# Patient Record
Sex: Male | Born: 1940 | Race: White | Hispanic: No | Marital: Married | Smoking: Former smoker
Health system: Southern US, Community
[De-identification: ages and names within clinical notes are randomized; demographics above are authoritative.]

## PROBLEM LIST (undated history)

## (undated) DIAGNOSIS — M1990 Unspecified osteoarthritis, unspecified site: Secondary | ICD-10-CM

## (undated) DIAGNOSIS — K565 Intestinal adhesions [bands], unspecified as to partial versus complete obstruction: Secondary | ICD-10-CM

## (undated) DIAGNOSIS — K432 Incisional hernia without obstruction or gangrene: Secondary | ICD-10-CM

## (undated) DIAGNOSIS — G8929 Other chronic pain: Secondary | ICD-10-CM

## (undated) DIAGNOSIS — J329 Chronic sinusitis, unspecified: Secondary | ICD-10-CM

## (undated) DIAGNOSIS — C801 Malignant (primary) neoplasm, unspecified: Secondary | ICD-10-CM

## (undated) DIAGNOSIS — M549 Dorsalgia, unspecified: Secondary | ICD-10-CM

## (undated) DIAGNOSIS — R55 Syncope and collapse: Secondary | ICD-10-CM

## (undated) DIAGNOSIS — I499 Cardiac arrhythmia, unspecified: Secondary | ICD-10-CM

## (undated) DIAGNOSIS — K219 Gastro-esophageal reflux disease without esophagitis: Secondary | ICD-10-CM

## (undated) DIAGNOSIS — R42 Dizziness and giddiness: Secondary | ICD-10-CM

## (undated) HISTORY — PX: ELBOW SURGERY: SHX618

## (undated) HISTORY — PX: ESOPHAGEAL DILATION: SHX303

## (undated) HISTORY — PX: SHOULDER ARTHROSCOPY: SHX128

## (undated) HISTORY — PX: VASECTOMY: SHX75

## (undated) HISTORY — PX: SACRAL NERVE STIMULATOR PLACEMENT: SHX2367

## (undated) HISTORY — PX: HERNIA REPAIR: SHX51

## (undated) HISTORY — PX: INCISION AND DRAINAGE FOOT: SHX1800

## (undated) HISTORY — PX: BACK SURGERY: SHX140

---

## 1997-09-21 ENCOUNTER — Encounter: Admission: RE | Admit: 1997-09-21 | Discharge: 1997-12-20 | Payer: Self-pay | Admitting: Specialist

## 1997-12-04 ENCOUNTER — Ambulatory Visit (HOSPITAL_BASED_OUTPATIENT_CLINIC_OR_DEPARTMENT_OTHER): Admission: RE | Admit: 1997-12-04 | Discharge: 1997-12-04 | Payer: Self-pay | Admitting: Surgery

## 1999-06-11 ENCOUNTER — Encounter (INDEPENDENT_AMBULATORY_CARE_PROVIDER_SITE_OTHER): Payer: Self-pay

## 1999-06-11 ENCOUNTER — Ambulatory Visit (HOSPITAL_COMMUNITY): Admission: RE | Admit: 1999-06-11 | Discharge: 1999-06-11 | Payer: Self-pay | Admitting: Gastroenterology

## 2000-09-07 ENCOUNTER — Encounter: Admission: RE | Admit: 2000-09-07 | Discharge: 2000-09-25 | Payer: Self-pay | Admitting: *Deleted

## 2001-05-14 ENCOUNTER — Encounter: Payer: Self-pay | Admitting: Gastroenterology

## 2001-05-14 ENCOUNTER — Ambulatory Visit (HOSPITAL_COMMUNITY): Admission: RE | Admit: 2001-05-14 | Discharge: 2001-05-14 | Payer: Self-pay | Admitting: Gastroenterology

## 2001-07-13 ENCOUNTER — Encounter: Payer: Self-pay | Admitting: Orthopaedic Surgery

## 2001-07-13 ENCOUNTER — Ambulatory Visit (HOSPITAL_COMMUNITY): Admission: RE | Admit: 2001-07-13 | Discharge: 2001-07-13 | Payer: Self-pay | Admitting: *Deleted

## 2001-08-20 ENCOUNTER — Encounter: Payer: Self-pay | Admitting: Orthopaedic Surgery

## 2001-08-25 ENCOUNTER — Inpatient Hospital Stay (HOSPITAL_COMMUNITY): Admission: RE | Admit: 2001-08-25 | Discharge: 2001-08-27 | Payer: Self-pay | Admitting: Orthopaedic Surgery

## 2001-08-25 ENCOUNTER — Encounter: Payer: Self-pay | Admitting: Orthopaedic Surgery

## 2001-08-25 HISTORY — PX: BACK SURGERY: SHX140

## 2002-03-24 ENCOUNTER — Encounter: Payer: Self-pay | Admitting: Orthopaedic Surgery

## 2002-03-24 ENCOUNTER — Ambulatory Visit (HOSPITAL_COMMUNITY): Admission: RE | Admit: 2002-03-24 | Discharge: 2002-03-25 | Payer: Self-pay | Admitting: Orthopaedic Surgery

## 2002-03-24 HISTORY — PX: OTHER SURGICAL HISTORY: SHX169

## 2002-04-23 HISTORY — PX: COLON SURGERY: SHX602

## 2002-05-17 ENCOUNTER — Inpatient Hospital Stay (HOSPITAL_COMMUNITY): Admission: EM | Admit: 2002-05-17 | Discharge: 2002-05-25 | Payer: Self-pay | Admitting: Emergency Medicine

## 2002-05-17 ENCOUNTER — Encounter: Payer: Self-pay | Admitting: Emergency Medicine

## 2002-05-18 ENCOUNTER — Encounter (INDEPENDENT_AMBULATORY_CARE_PROVIDER_SITE_OTHER): Payer: Self-pay | Admitting: Specialist

## 2002-08-22 HISTORY — PX: OTHER SURGICAL HISTORY: SHX169

## 2002-10-14 ENCOUNTER — Inpatient Hospital Stay (HOSPITAL_COMMUNITY): Admission: RE | Admit: 2002-10-14 | Discharge: 2002-10-20 | Payer: Self-pay

## 2002-10-14 ENCOUNTER — Encounter (INDEPENDENT_AMBULATORY_CARE_PROVIDER_SITE_OTHER): Payer: Self-pay | Admitting: Specialist

## 2003-05-19 ENCOUNTER — Ambulatory Visit (HOSPITAL_COMMUNITY): Admission: RE | Admit: 2003-05-19 | Discharge: 2003-05-19 | Payer: Self-pay | Admitting: Orthopaedic Surgery

## 2006-01-19 ENCOUNTER — Encounter: Admission: RE | Admit: 2006-01-19 | Discharge: 2006-01-19 | Payer: Self-pay | Admitting: Orthopaedic Surgery

## 2006-06-26 ENCOUNTER — Encounter: Admission: RE | Admit: 2006-06-26 | Discharge: 2006-06-26 | Payer: Self-pay | Admitting: Orthopaedic Surgery

## 2007-02-13 ENCOUNTER — Encounter: Admission: RE | Admit: 2007-02-13 | Discharge: 2007-02-13 | Payer: Self-pay | Admitting: Orthopaedic Surgery

## 2007-11-23 ENCOUNTER — Encounter: Admission: RE | Admit: 2007-11-23 | Discharge: 2007-11-23 | Payer: Self-pay | Admitting: Orthopaedic Surgery

## 2008-02-10 ENCOUNTER — Encounter: Admission: RE | Admit: 2008-02-10 | Discharge: 2008-02-10 | Payer: Self-pay | Admitting: Orthopaedic Surgery

## 2008-03-31 ENCOUNTER — Encounter: Admission: RE | Admit: 2008-03-31 | Discharge: 2008-03-31 | Payer: Self-pay | Admitting: Orthopedic Surgery

## 2008-06-23 HISTORY — PX: OTHER SURGICAL HISTORY: SHX169

## 2008-06-23 HISTORY — PX: KNEE ARTHROSCOPY: SUR90

## 2008-10-05 ENCOUNTER — Ambulatory Visit (HOSPITAL_COMMUNITY): Admission: RE | Admit: 2008-10-05 | Discharge: 2008-10-05 | Payer: Self-pay | Admitting: Gastroenterology

## 2008-10-05 ENCOUNTER — Encounter (INDEPENDENT_AMBULATORY_CARE_PROVIDER_SITE_OTHER): Payer: Self-pay | Admitting: Gastroenterology

## 2009-03-06 ENCOUNTER — Inpatient Hospital Stay (HOSPITAL_COMMUNITY): Admission: EM | Admit: 2009-03-06 | Discharge: 2009-03-10 | Payer: Self-pay | Admitting: Emergency Medicine

## 2009-07-03 ENCOUNTER — Inpatient Hospital Stay (HOSPITAL_COMMUNITY): Admission: EM | Admit: 2009-07-03 | Discharge: 2009-07-06 | Payer: Self-pay | Admitting: Emergency Medicine

## 2010-07-14 ENCOUNTER — Encounter: Payer: Self-pay | Admitting: Orthopaedic Surgery

## 2010-09-08 LAB — COMPREHENSIVE METABOLIC PANEL
ALT: 37 U/L (ref 0–53)
AST: 30 U/L (ref 0–37)
AST: 33 U/L (ref 0–37)
Albumin: 3.3 g/dL — ABNORMAL LOW (ref 3.5–5.2)
Alkaline Phosphatase: 78 U/L (ref 39–117)
BUN: 20 mg/dL (ref 6–23)
BUN: 25 mg/dL — ABNORMAL HIGH (ref 6–23)
Calcium: 10.6 mg/dL — ABNORMAL HIGH (ref 8.4–10.5)
Chloride: 101 mEq/L (ref 96–112)
Chloride: 111 mEq/L (ref 96–112)
Creatinine, Ser: 1.35 mg/dL (ref 0.4–1.5)
GFR calc Af Amer: 31 mL/min — ABNORMAL LOW (ref >60)
Glucose, Bld: 194 mg/dL — ABNORMAL HIGH (ref 70–99)
Sodium: 136 mEq/L (ref 135–145)
Total Bilirubin: 1.1 mg/dL (ref 0.3–1.2)
Total Bilirubin: 1.3 mg/dL — ABNORMAL HIGH (ref 0.3–1.2)
Total Protein: 6.1 g/dL (ref 6.0–8.3)
Total Protein: 9.8 g/dL — ABNORMAL HIGH (ref 6.0–8.3)

## 2010-09-08 LAB — DIFFERENTIAL
Basophils Absolute: 0 10*3/uL (ref 0.0–0.1)
Basophils Relative: 0 % (ref 0–1)
Eosinophils Absolute: 0.1 10*3/uL (ref 0.0–0.7)
Eosinophils Relative: 2 % (ref 0–5)
Lymphocytes Relative: 17 % (ref 12–46)
Neutro Abs: 10.1 10*3/uL — ABNORMAL HIGH (ref 1.7–7.7)
Neutro Abs: 3.1 10*3/uL (ref 1.7–7.7)
Neutrophils Relative %: 74 % (ref 43–77)
Neutrophils Relative %: 88 % — ABNORMAL HIGH (ref 43–77)

## 2010-09-08 LAB — BASIC METABOLIC PANEL
Calcium: 8.7 mg/dL (ref 8.4–10.5)
GFR calc Af Amer: >60 mL/min (ref >60)
GFR calc non Af Amer: >60 mL/min (ref >60)
Potassium: 4 mEq/L (ref 3.5–5.1)

## 2010-09-08 LAB — CBC
HCT: 36.1 % — ABNORMAL LOW (ref 39.0–52.0)
HCT: 51.6 % (ref 39.0–52.0)
Hemoglobin: 17.5 g/dL — ABNORMAL HIGH (ref 13.0–17.0)
MCHC: 34.3 g/dL (ref 30.0–36.0)
MCHC: 34.8 g/dL (ref 30.0–36.0)
MCV: 88.4 fL (ref 78.0–100.0)
MCV: 88.7 fL (ref 78.0–100.0)
MCV: 89 fL (ref 78.0–100.0)
Platelets: 139 10*3/uL — ABNORMAL LOW (ref 150–400)
RBC: 3.94 MIL/uL — ABNORMAL LOW (ref 4.22–5.81)
RBC: 5.8 MIL/uL (ref 4.22–5.81)
RDW: 13.3 % (ref 11.5–15.5)
WBC: 11.4 10*3/uL — ABNORMAL HIGH (ref 4.0–10.5)

## 2010-09-08 LAB — FECAL LACTOFERRIN, QUANT

## 2010-09-08 LAB — OVA AND PARASITE EXAMINATION

## 2010-09-08 LAB — CLOSTRIDIUM DIFFICILE EIA: C difficile Toxins A+B, EIA: NEGATIVE

## 2010-09-08 LAB — LIPASE, BLOOD: Lipase: 17 U/L (ref 11–59)

## 2010-09-27 LAB — CBC
HCT: 41.4 % (ref 39.0–52.0)
MCHC: 34.1 g/dL (ref 30.0–36.0)
MCHC: 35.4 g/dL (ref 30.0–36.0)
MCV: 87.9 fL (ref 78.0–100.0)
MCV: 88.5 fL (ref 78.0–100.0)
Platelets: 131 10*3/uL — ABNORMAL LOW (ref 150–400)
Platelets: 200 10*3/uL (ref 150–400)
RBC: 3.78 MIL/uL — ABNORMAL LOW (ref 4.22–5.81)
RDW: 13.1 % (ref 11.5–15.5)
RDW: 13.1 % (ref 11.5–15.5)
WBC: 9.4 10*3/uL (ref 4.0–10.5)

## 2010-09-27 LAB — URINALYSIS, ROUTINE W REFLEX MICROSCOPIC
Bilirubin Urine: NEGATIVE
Ketones, ur: NEGATIVE mg/dL
Nitrite: NEGATIVE
Specific Gravity, Urine: 1.008 (ref 1.005–1.030)
pH: 5.5 (ref 5.0–8.0)

## 2010-09-27 LAB — DIFFERENTIAL
Basophils Absolute: 0.1 10*3/uL (ref 0.0–0.1)
Lymphocytes Relative: 12 % (ref 12–46)
Lymphs Abs: 1.1 10*3/uL (ref 0.7–4.0)
Monocytes Absolute: 0.4 10*3/uL (ref 0.1–1.0)
Neutro Abs: 7.7 10*3/uL (ref 1.7–7.7)

## 2010-09-27 LAB — COMPREHENSIVE METABOLIC PANEL
Albumin: 4.3 g/dL (ref 3.5–5.2)
BUN: 8 mg/dL (ref 6–23)
Calcium: 9.6 mg/dL (ref 8.4–10.5)
Chloride: 102 mEq/L (ref 96–112)
Creatinine, Ser: 1.02 mg/dL (ref 0.4–1.5)
Total Bilirubin: 0.7 mg/dL (ref 0.3–1.2)

## 2010-09-27 LAB — LIPASE, BLOOD: Lipase: 11 U/L (ref 11–59)

## 2010-09-27 LAB — BASIC METABOLIC PANEL
BUN: 3 mg/dL — ABNORMAL LOW (ref 6–23)
Calcium: 9 mg/dL (ref 8.4–10.5)
Creatinine, Ser: 0.97 mg/dL (ref 0.4–1.5)
Glucose, Bld: 141 mg/dL — ABNORMAL HIGH (ref 70–99)
Potassium: 3.7 mEq/L (ref 3.5–5.1)
Sodium: 138 mEq/L (ref 135–145)

## 2010-11-05 NOTE — Op Note (Signed)
NAMEJENIEL, [name]                ACCOUNT NO.:  [id number]   MEDICAL RECORD NO.:  [id number]          PATIENT TYPE:  AMB   LOCATION:  ENDO                         FACILITY:  [facility]   PHYSICIAN:  Petra Kuba, M.D.    DATE OF BIRTH:  Jun 29, 1940   DATE OF PROCEDURE:  10/05/2008  DATE OF DISCHARGE:                               OPERATIVE REPORT   PROCEDURE:  Esophagogastroduodenoscopy with biopsy and dilatation.   INDICATIONS:  The patient with dysphagia and history of esophageal  stricture.  Consent was signed after risks, benefits, methods, and  options thoroughly discussed multiple times in the past.   MEDICINES USED:  1. Fentanyl 100 mcg.  2. Versed 10 mg.   SURGEON:  Petra Kuba, MD   DETAILS OF PROCEDURE:  The videoendoscope was inserted by direct vision.  Initially, the proximal and mid esophagus was normal.  He did have some  spasm at the GE junction, but when it resolved, he had a small hiatal  hernia and a widely patent thin fibrous ring.  The scope was slowly  withdrawn through the esophagus.  At some point, it contracted and it  seem to have some ring-like formations possibly compatible with  eosinophilic esophagitis.  Based on that, we elected to take a few  biopsies of the proximal esophagus and not do a Savary dilatation, but  instead do a less aggressive medium-sized balloon dilatation.  The  scope, prior to doing the balloon, was advanced into the stomach,  advanced through a normal antrum, normal pylorus, into a normal duodenal  bulb around the celiac to normal second portion of the duodenum.  A  normal-appearing ampulla was seen.  The scope was withdrawn back to the  bulb and a good look there ruled out abnormalities in all locations.  The scope was withdrawn back to the stomach and retroflexed.  High in  the cardia, the hiatal hernia was confirmed.  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  Straight visualization of the stomach did not  reveal any additional findings.  The scope was then slowly withdrawn one  more time through the esophagus, and again we elected to proceed with a  balloon dilatation.  We advanced into the stomach the 8-cm, 12-15-mm  balloon was advanced into the stomach and withdrawn back to the proper  position across the ring.  We initially inflated it to 12, but the  balloon could slide in and out easily.  We increased it to 13.5, and  again the balloon could slide in and out easily, and we then inflated it  to 15 and held it for a minute.  After 1 minute, the balloon was  removed.  There was minimal trauma to the ring and a little bit of  trauma to the distal esophagus, but no significant tear or complication,  but based on the trauma caused by the 15 balloon, we elected to not be  any  further aggressive until we get the biopsy results in the future.  We elected to stop the procedure at this junction.  The scope was  removed.  The patient tolerated the procedure well.  There was no  obvious immediate complication.   ENDOSCOPIC DIAGNOSES:  1. Questionable eosinophilic esophagitis status post proximal      esophageal biopsies.  2. Distal esophageal spasm.  3. Small hiatal hernia with a widely patent ring status post balloon      dilatation to 15 mm as above with minimal trauma.  4. Few gastric tiny polyps not biopsied, without significance.  5. Otherwise, within normal limits esophagogastroduodenoscopy.   PLAN:  Await pathology to see if he has eosinophilic esophagitis and  also to see if the dilatation works.  He likes the Nexium better than  the sample pump inhibitors I tried, which I believe is Protonix and so  we will give him a prescription of that and see him back p.r.n. or in 6  weeks to decide any further workup plans or more aggressive dilatation  if he does not have  eosinophilic esophagitis.           ______________________________  Petra Kuba, M.D.     MEM/MEDQ  D:  10/05/2008  T:  10/06/2008  Job:  [id number]   cc:   Holley Bouche, M.D.

## 2010-11-08 NOTE — Discharge Summary (Signed)
[place]. [facility]  Patient:    Kent [name], Kent [name] Visit Number: [id number] MRN: [id number]          Service Type: SUR Location: [id number] Attending Physician:  Patricia Nettle Dictated by:   Druscilla Brownie. Shela Nevin, P.A. Admit Date:  08/25/2001 Discharge Date: 08/27/2001                             Discharge Summary  ADMISSION DIAGNOSES: 1. Cervical spondylitis with radiculopathy. 2. Gastroesophageal reflux disease.  DISCHARGE DIAGNOSES: 1. Cervical spondylitis with radiculopathy. 2. Gastroesophageal reflux disease.  OPERATION:  On August 25, 2001 the patient underwent anterior cervical decompression foraminotomy at C4-C5, C5-C6, C6-C7 with left iliac crest bone graft.  DISCUSSION:  The patient has had ongoing and progressive problems concerning cervical spine with radiculitis in the upper extremities.  He is quite uncomfortable with this.  He has essentially had to "live on Darvocet" due to this pain and discomfort.  He has finally decided it would be worthwhile for surgical intervention.  After MRI confirmation of the pathology, he was scheduled for the above surgery.  HOSPITAL COURSE:  The patient tolerated the surgical procedure quite well.  We placed him on 3300 for close observation concerning any edema he may had in the neck preventing swallowing or breathing.  He went through the first couple of days with no problems whatsoever.  The Penrose drain that was left in the neck wound was pulled.  Dr. Noel Gerold saw him on the morning of surgery and felt that he could be maintained in his home environment.  Arrangements were made for discharge.  The patient had some sinus problems from previous sinus surgery and felt like it was infected.  Indeed he did have a green drainage.  It was felt we could treat this with a Z-pak.  LABORATORY VALUES IN THE HOSPITAL:  Hematocrit and hemoglobin which was essentially normal other than some mild anemia.  BMET  was essentially negative.  Urinalysis negative for urinary tract infection.  Electrocardiogram showed normal sinus rhythm.  No chest x-ray report is on his chart.  CONDITION ON DISCHARGE:  Improved and stable.  PLAN:  The patient is discharged to his home.  Use dry dressing to the neck p.r.n. and the left hip dressing was dry at discharge and he may use a dry bandage there as well.  He was given a prescription for Percocet 5 mg, #50, one to q.4-6h. p.r.n. pain. Robaxin 500 mg, #30, with a refill, one q.6h. p.r.n. muscle spasm. Follow directions on Z-pak which prescription is given.  Return to see Dr. Noel Gerold in about ten days. Dictated by:   Druscilla Brownie. Shela Nevin, P.A. Attending Physician:  Patricia Nettle. DD:  08/27/01 TD:  08/30/01 Job: [id number]

## 2010-11-08 NOTE — H&P (Signed)
NAME:  Kent [name], Kent [name]                          ACCOUNT NO.:  [id number]   MEDICAL RECORD NO.:  [id number]                   PATIENT TYPE:  INP   LOCATION:  0462                                 FACILITY:  [facility]   PHYSICIAN:  Lorre Munroe., M.D.            DATE OF BIRTH:  1941-03-01   DATE OF ADMISSION:  05/17/2002  DATE OF DISCHARGE:                                HISTORY & PHYSICAL   CHIEF COMPLAINT:  Abdominal pain.   HISTORY OF PRESENT ILLNESS:  The patient is a 70 year old white male with  severe increasing abdominal pain for one day.  He had elevation of the white  count with shift to the left.  CT scan was compatible with a severe colitis,  perhaps ischemic, involving mostly the left side of the colon.  There was  also a good deal of free abdominal fluid and distention of the bowel.  He  was brought into the hospital for emergency exploration.   PAST MEDICAL HISTORY:  The patient denies any heart trouble.  He is on no  cardiac medications.   MEDICATIONS:  Nexium, Percocet.  He takes that because of a lot of problems  with his back.   ALLERGIES:  He is allergic to CIPRO.   SOCIAL HISTORY:  He does not drink alcoholic beverages.  He used to smoke  cigarettes, but quit 20 years ago.  He denies all serious chronic ailments  except the above.   PAST SURGICAL HISTORY:  He has had surgery on his back, surgery on his neck  but he cannot tell me exactly what it was.  He has also had orthopedic  surgery on foot and the arm.   PHYSICAL EXAMINATION:  GENERAL:  The patient is in acute pain, although is  oriented and appropriate.  VITAL SIGNS:  As recorded by nursing staff.  NEUROLOGIC:  Mental status was felt to be normal.  HEENT:  Head, neck, eyes, ears, nose, throat, and throat unremarkable.  HEART:  Rate fast, rhythm regular with no murmur or gallop noted.  CHEST:  Clear to auscultation.  ABDOMEN:  Distended and extremely tender in all quadrants.  No bowel sounds  heard.   No masses noted.  GENITALIA:  Normal.  RECTAL:  Normal.  Very small amount of brown stool present without any blood  in it.  Prostate was a little bit enlarged.  EXTREMITIES:  Normal.  LYMPH:  Nodes not enlarged.  SKIN:  No lesions are noted.   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   PLAN:  Immediate exploration.                                              Lorre Munroe., M.D.   Jodi Marble  D:  05/20/2002  T:  05/20/2002  Job:  [id number]

## 2010-11-08 NOTE — H&P (Signed)
NAME:  Kent [name], Kent [name]                          ACCOUNT NO.:  [id number]   MEDICAL RECORD NO.:  [id number]                   PATIENT TYPE:  OIB   LOCATION:  5024                                 FACILITY:  [facility]   PHYSICIAN:  Patricia Nettle, M.D.                  DATE OF BIRTH:  1941/04/21   DATE OF ADMISSION:  03/24/2002  DATE OF DISCHARGE:  03/25/2002                                HISTORY & PHYSICAL   CHIEF COMPLAINT:  Back and right lower extremity pain.   HISTORY:  The patient is a 70 year old male with back and right lower  extremity pain that has been getting progressively worse in the recent L5  distribution. It is significantly worse with activities, somewhat relieved  with rest; however, it is near constant in nature. He has been taking  Percocet 10/750 four tablets a day as well as Celebrex and Elavil. MRI scan  of the lumbar scan did show broad-based disk bulge at L4-5 with facet  hypertrophy, element of __________ stenosis. Pain had gotten to the point  where it was near constant in nature and was affecting his activities of  daily living as well as his quality of life. Risks and benefits of surgery  were discussed with the patient by Dr. Noel Gerold as well as myself, and he  indicating understanding and opted to proceed.   ALLERGIES:  No known drug allergies.   MEDICATIONS:  1. Mepergan Fortis p.r.n.  2. Nexium 40 mg p.o. q.d.  3. Amoxicillin for sinus infection.   PAST MEDICAL HISTORY:  Significant for gastroesophageal reflux disease,  history of bronchitis, and history of basal cell cancer.   PAST SURGICAL HISTORY:  Anterior cervical diskectomy and fusion in 2003.  1. Sinus surgery in '96.  2. Hemorrhoidectomy in '99.  3. Right foot surgery in 2002.  4. Basal cell carcinoma removed from his back.   SOCIAL HISTORY:  The patient denies tobacco use. Denies alcohol use. He is  retired. Married, has two grown children. His wife is in good health and  will help him  through his postoperative course.   FAMILY HISTORY:  Father deceased at age 49 secondary to a brain tumor.  Mother deceased at age 1, unknown cause.   REVIEW OF SYMPTOMS:  The patient denies any fevers, chills, sweats, or  bleeding tendencies. CNS:  Denies blurred vision, double vision, seizures,  headaches, paralysis. CARDIOVASCULAR EXAM:  She denies pain, angina,  orthopnea, claudication, palpitations. PULMONARY:  Denies shortness of  breath, productive cough, or hemoptysis. GASTROINTESTINAL:  Denies nausea,  vomiting, constipation, diarrhea, melena, dysuria and hematuria.  MUSCULOSKELETAL:  As per HPI.   PHYSICAL EXAMINATION:  VITAL SIGNS:  Blood pressure 100/76, respirations 60  and unlabored, pulse 72 and regular.  GENERAL APPEARANCE:  The patient is a 70 year old white male who is alert  and oriented in no acute distress, well-nourished, well-groomed, appears his  stated age. Pleasant and cooperative to examination.  HEENT:  Normocephalic, atraumatic. Pupils are equal and reactive.  Extraocular movements intact. Nasal patent. Oropharynx clear.  NECK:  Supple to palpation, no bruits appreciated, no lymphadenopathy or  thyromegaly noted. There is a well-healed scar on the left side of his  negative from his anterior cervical surgery earlier this year.  CHEST:  Clear to auscultation bilaterally. No rales, rhonchi, wheezes or  friction rubs.  HEART:  S1 and S2. Regular, rate, and rhythm with no murmurs, gallops, or  rubs noted.  ABDOMEN:  Soft to palpation, positive bowel sounds, nontender, nondistended,  no organomegaly noted.  GENITOURINARY:  Not pertinent.  EXTREMITIES:  __________ dorsalis pedis and posterior tibialis pulses. He is  neurovascularly intact. Motor and sensation was intact grossly on today's  exam. The sensation is decreased in a right L5 pattern. Straight leg raise  is negative. Head to knee range of motion is pain free.  SKIN:  Intact.   LABORATORY DATA:   MRI shows a HNP at L4-5.   IMPRESSION:  1. Herniated nucleus pulposus, L4-5.  2. Gastroesophageal reflux disease.  3. History of basal cell carcinoma.   PLAN:  1. Admit to [facility] hospital on 03/24/02 for microdiskectomy on L4-5 on     the right to be done by Dr. Noel Gerold.  2. The patient's primary care is Dr. Johny Blamer.     Verlin Fester, P.A.                       Patricia Nettle, M.D.    CM/MEDQ  D:  03/24/2002  T:  03/28/2002  Job:  [id number]

## 2010-11-08 NOTE — H&P (Signed)
   NAME:  Kent [name], Kent [name]                          ACCOUNT NO.:  [id number]   MEDICAL RECORD NO.:  [id number]                   PATIENT TYPE:  INP   LOCATION:  X001                                 FACILITY:  [facility]   PHYSICIAN:  Lorre Munroe., M.D.            DATE OF BIRTH:  [birth year]   DATE OF ADMISSION:  10/14/2002  DATE OF DISCHARGE:                                HISTORY & PHYSICAL   CHIEF COMPLAINT:  Ileostomy.   HISTORY OF PRESENT ILLNESS:  The patient is now about five months status  post ileostomy and total abdominal colectomy with stapling of the rectum  because of infarction of the colon thought to be due on a vascular basis.  He recovered quite nicely and requested ileostomy closure.  I performed  sigmoidoscopy showing a healthy distal bowel.  He took a bowel prep at home  and was admitted to the hospital for surgery.   PAST MEDICAL HISTORY:  The patient denies serious chronic aliments.  He  specifically denies any heart trouble.   MEDICATIONS:  1. Nexium occasionally.  2. He has been on doxycycline for prostatitis.  3. Percocet occasionally.   PAST SURGICAL HISTORY:  1. Back surgery and neck surgery, but not sure of the procedures.  2. Also had some orthopaedic surgery on the foot and arm.   ALLERGIES:  1. CIPRO causes nausea.  2. CODEINE causes itching.   HABITS:  He does not drink alcoholic beverages.  He smoked for a long time,  but quit about 20 years ago.   FAMILY HISTORY:  Unremarkable.   CHILDHOOD ILLNESSES:  Unremarkable.   REVIEW OF SYMPTOMS:  Unremarkable.   PHYSICAL EXAMINATION:  GENERAL:  A cheerful, healthy-appearing man.  Mental  status normal.  VITAL SIGNS:  Unremarkable per nursing report.  HEENT:  Normal.  NECK:  Normal.  CHEST:  Clear to auscultation.  HEART:  Regular rate and rhythm, no murmur or gallop.  ABDOMEN:  Functioning ileostomy in the right mid abdomen.  Well-healed  midline incision without hernia.  No mass, tenderness,  or organomegaly.  RECTUM:  Normal.  GENITOURINARY:  Normal.  EXTREMITIES:  Normal, pulses good.  SKIN:  No lesions noted.  LYMPH NODES:  None enlarged.  NEUROLOGIC:  Normal.   IMPRESSION:  1. Functioning ileostomy.  2. 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   PLAN:  Ileostomy closure.                                               Lorre Munroe., M.D.    Jodi Marble  D:  10/14/2002  T:  10/14/2002  Job:  [id number]

## 2010-11-08 NOTE — Op Note (Signed)
NAME:  Kent [name], Kent [name]                          ACCOUNT NO.:  [id number]   MEDICAL RECORD NO.:  [id number]                   PATIENT TYPE:  INP   LOCATION:  0374                                 FACILITY:  [facility]   PHYSICIAN:  Kent Munroe., M.D.            DATE OF BIRTH:  [birth year]   DATE OF PROCEDURE:  10/14/2002  DATE OF DISCHARGE:                                 OPERATIVE REPORT   PREOPERATIVE DIAGNOSIS:  Functional colostomy, status post total colectomy  for ischemic colitis.   POSTOPERATIVE DIAGNOSIS:  Functional colostomy, status post total colectomy  for ischemic colitis.   OPERATION:  Ileoproctostomy; laparoscopy and takedown of adhesions.   SURGEON:  Kent [name], M.D.   ASSISTANT:  Kent [name], M.D.   ANESTHESIA:  General.   DESCRIPTION OF PROCEDURE:  After the patient was monitored and anesthetized  and had routine preparation and draping of the abdomen and had a Foley  catheter in place and with the legs up in stirrups, I made a small  transverse incision in the left mid abdomen far laterally and dissected down  through fat, incised the external oblique in the direction of the fibers,  split the other muscle layers, then pulled up on the transversalis fascia  and peritoneum and opened this sharply. I placed a 0 Vicryl pursestring in  the muscles and secured an Hasson canula and achieved pneumoperitoneum.  There were some midline adhesions.  Otherwise, there were very few  adhesions.  I put in a 10 mm right upper quadrant port and a 5 mm left lower  quadrant port, took down the adhesions using blunt dissection and the  harmonic scalpel.  I then was able to easily see the ileostomy and found it  to be generally free of adhesions.  The pelvis was also quite free of  adhesions.  I could see the sutures that I had placed in the rectal stump in  order to identify it for closure.  I did not feel that I needed to do any  further dissection laparoscopically,  and so I then made an elliptical  incision around the ileostomy and took it down, entered the abdomen.  There  was some omentum present which I thought might possibly be devitalized, so I  pulled it up into the incision, clamped across it with Kelly clamps, and  resected it.  Hemostasis was not a problem.  There was nice mobility of the  ileum.  I attempted to use a 25 mm EEA stapler, but the bowel would not  accept the sizer, and so I used 21 mm and placed the anvil into the end of  the bowel after resecting the ileostomy back to normal-appearing bowel,  ligating the vessels with 2-0 silks.  It fit nicely, and I put a pursestring  suture into the bowel and tied it down snugly.  Then I dropped that back  into the  abdomen, closed the ileostomy incision with two pursestring sutures  of #1 PDS.  I then re-introduced the pneumoperitoneum and using a grasper,  found that the distal ileum would go down into the pelvis quite nicely  without tension.  I then went down to the perineum and I passed up 21 mm EEA  stapler up through the rectum, carefully guiding it toward the end of the  bowel.  Because of some fixation of the bowel and angulation, it was  somewhat difficult.  I could not ever get it all the way through the end of  the bowel, and I began to notice that there was some seromuscular tearing of  the antimesenteric border of the colon.  This appeared to be over a fairly  long segment and, at that point, I decided that it would be hazardous to  attempt EEA anastomosis, so I abandoned the laparoscopic technique.  I made  a lower midline incision from just below the umbilicus down to the symphysis  pubis and entered the abdomen.  Once again, I found that the ends of the  bowel came together quite well.  I sutured the seromuscular riff with  interrupted 2-0 and 3-0 silk stitches, and it appeared to provide excellent  strength.  I then  performed end of ileum to side of rectal anastomosis with   3-0 silk stitches after spatulating the end of the bowel to provide a larger  anastomosis.  The vascularity in both ends of the bowel was good.  After  completing that, I found that there was no impingement or kinking of the  ileum at any point and that there was no evidence of bleeding.  I irrigated  the abdomen and removed the irrigant.  The sponge, needle, and instrument  counts were correct.  I closed the left mid abdominal incision with closure  of the pursestring.  I closed the midline incision with running #1 PDS.  I  closed all skin incisions with staples after irrigating the subcutaneous  tissues.  The patient tolerated the operation well.                                               Kent Munroe., M.D.    Kent [name]  D:  10/14/2002  T:  10/14/2002  Job:  [id number]

## 2010-11-08 NOTE — Op Note (Signed)
[place] [place]  Patient:    Kent [name], Kent [name] Visit Number: [id number] MRN: [id number]          Service Type: SUR Location: [id number] Attending Physician:  Patricia Nettle Dictated by:   Patricia Nettle, M.D. Proc. Date: 08/25/01 Admit Date:  08/25/2001                             Operative Report  DIAGNOSIS:  Cervical spondylitic radiculopathy.  PROCEDURES: 1. Anterior cervical diskectomy with decompression of the spinal cord and    nerve roots at C4-5, C5-6, C6-7. 2. Anterior cervical arthrodesis, C4-5, C5-6, C6-7. 3. Anterior cervical instrumentation utilizing Synthes allograft prosthesis    spacers and anterior cervical plate from C4 through C7. 4. Left anterior iliac crest morcellized bone graft. 5. Placement and removal of Gardner-Wells tongs. 6. Neurologic monitoring with somatosensory evoked potentials.  SURGEON:  Patricia Nettle, M.D.  ANESTHESIA:  General endotracheal.  COMPLICATIONS:  None.  INDICATION:  The patient is a 70 year old male who has a history of right-sided neck and severe upper extremity pain with radiation into the hand. He has had an increasing problem with dropping small objects from the right hand, and he is also having difficulties with zippers and buttons.  He describes a sharp, stabbing sensation radiating into the thumb, long, and index finger.  He has failed conservative treatment, including pain medications, anti-inflammatories, physical therapy, and cortisone injections. Physical examination shows 4-/5 strength in the right biceps, triceps, wrist extensor, wrist flexors, as well as in grip strength.  His reflexes are decreased on the right upper extremity.  Extension, flexion, and rotation of the cervical spine recreate his right arm pain.  Lower extremity reflexes are +2 and symmetric.  Babinski, clonus, and Hoffman are negative.  Sensation is decreased in the C5, C6, and C7 distribution in the right upper  extremity.  He does have a mild shoulder impingement sign and some tenderness over the [facility] joint.  After a thorough discussion with the patient, he has elected to undergo anterior cervical diskectomy and fusion at C4-5, C5-6, C6-7, in hopes of relieving his symptoms.  He was offered another opinion and denied.  DESCRIPTION OF PROCEDURE:  The patient was properly identified in the holding area as Kent [name] and taken to the operating room.  He underwent general endotracheal anesthesia without difficulty.  Care was taken not to hyperextend the neck.  At this point neuro monitoring was established in the form of SSEPs.  It should be noted that the SSEPs were monitored throughout the procedure, including positioning, and there were no changes.  The patients neck was placed into slight hyperextension.  The Gardner-Wells tongs were placed after sterilely prepping the scalp.  Five pounds of weight was then hung.  The neck and left iliac crest were prepped and draped in the usual standard sterile fashion.  All bony prominences were padded.  Lidocaine with epinephrine 1 cc was then injected subcutaneously halfway between the cricoid cartilage and thyroid cartilage in line with the planned incision.  A 5 cm incision was then made starting in the midline and extending to the midportion of the sternocleidomastoid muscle, again at the C5-6 level.  This was carried down to the level of the platysma.  The platysma muscle was then incised sharply, exposing the underlying sternocleidomastoid muscle.  A plane was developed under the platysma, fully skeletonizing the sternocleidomastoid. The interval between the  SCM and the strap muscles was then developed down to the anterior cervical spine.  We palpated the carotid pulse and protected that at all times.  Medially the esophagus and trachea were protected.  The bones and disks were identified, and a spinal needle was placed an x-ray taken to confirm the  C5-6 level.  While we were waiting for the x-ray to be developed, attention was directed to the left iliac crest.  A 1 cm incision was made 2 cm posterior to the anterior superior iliac spine.  This was carried down to the fascia, the deep fascia incised, exposing the iliac crest.  We then used the 10 mm Salzer bone harvester to collect 3 cc of morcellized cancellous bone. The wound was irrigated and the deep fascia closed with #1 Vicryl suture.  The subcutaneous layers were closed with 2-0 interrupted Vicryl, followed by a running Monocryl and then benzoin and Steri-Strips.  Attention was then directed back to the neck.  We elevated the longus colli muscle from C4 to C7 bilaterally.  Care was taken to protect the cephalad disk at C3-4 as well as the C7-T1 disk below.  After the longus colli muscle had been elevated, exposing full width of the vertebral bodies, the self-retaining Shadow Line retractor was placed.  We then brought in the operating microscope.  This was prepped and draped in the usual sterile fashion.  Diskectomies were performed at C4-5, C5-6, and C6-7 using pituitary rongeurs and microcurettes.  Then utilizing the bur, we removed the anterior osteophytes as well as the end plate of C4 and C5 back to the posterior longitudinal ligament.  Using 1 mm Kerrisons and microcurettes, the posterior longitudinal ligament was exposed. It was examined, and there was no rent found.  We then decompressed the foramina bilaterally.  Large uncovertebral spurs were identified, and these were taken down using the bur and 1 mm Kerrisons.  At the completion of the decompression, the foramina were patent.  This was confirmed using a blunt nerve hook.  We then performed a similar procedure at C5-6.  At this level a soft disk herniation was identified in addition to the uncovertebral spurs. The herniation was in the midline.  This was completely removed, and the PLL was taken down exposing the  underlying spinal cord.  Again a similar procedure was carried out at C6-7, exposing the PLL and taking down the uncovertebral spurs within the foramen.  At this point the wound was copiously irrigated.  The bur was used to prepare the end plates, and a bridge was fashioned posteriorly to protect the grafts from extruding.  We then placed the [facility] distractor with pins in C4 and C7.  Gentle distraction was applied.  Using the Synthes allograft trial spacers, we chose a 9 mm graft for C4-5, a 9 mm graft for C5-6, and an 11 mm graft for C6-7.  The allografts were then reconstituted and then packed with morcellized autogenous bone that had been previously collected.  The grafts were carefully tamped into position.  At this point the [facility] distractor was removed.  We then directed our attention to an anterior cervical plate.  We chose an appropriately-sized _____ C-Tek plate and placed 12 mm screws in C7 and 14 mm screws in C4.  We took an x-ray and the plate was in acceptable position, so we then went ahead and used 12 mm screws, filling the remaining holes at C5 and C6.  Again the wound was copiously irrigated. The Penrose drain  was placed.  The locking mechanism was engaged on the plate. The platysma muscle was closed using 2-0 interrupted Vicryl suture. Subcutaneous layers were closed using 3-0 inverted Vicryl suture, followed by a running Monocryl.  Benzoin and Steri-Strips were placed.  A sterile dressing was applied to the neck and the hip.  Drapes were removed and the Aspen collar was placed.  The patient was extubated without difficulty and was moving all four extremities when he was transferred to the recovery room in stable condition.  Again the SSEPs remained stable throughout the procedure. Dictated by:   Patricia Nettle, M.D. Attending Physician:  Patricia Nettle. DD:  08/25/01 TD:  08/26/01 Job: [id number]

## 2010-11-08 NOTE — H&P (Signed)
[place]. [facility]  Patient:    Kent [name], Kent [name] Visit Number: [id number] MRN: [id number]          Service Type: Attending:  Patricia Nettle, M.D. Dictated by:   Sammuel Cooper. Mahar, P.A. Adm. Date:  08/25/01                           History and Physical  DATE OF BIRTH:  02/15/2041  CHIEF COMPLAINT:  Neck and right arm pain.  HISTORY OF PRESENT ILLNESS:  The patient is a 70 year old white male who had a long history of neck and right arm pain.  This has been increasing in severity as of late.  It is getting to the point that it is near constant in nature. It does cause some pain and keeps him up at night.  It is interfering significantly with his activities of daily living and quality of life.  The proposed surgery has been discussed, including risks and benefits, with the patient by Patricia Nettle, M.D.  He indicates an understanding and wishes to proceed.  ALLERGIES:  CIPRO causes nausea and vomiting and hydrocodone causes itching.  MEDICATIONS: 1. Nexium 40 mg p.o. b.i.d. 2. Ambien 10 mg q.h.s. p.r.n. 3. Darvocet p.r.n.  PAST MEDICAL HISTORY:  Significant for: 1. Gastroesophageal reflux disease. 2. Basal cell carcinoma.  PAST SURGICAL HISTORY: 1. Sinus surgery in 1996. 2. Hemorrhoidectomy in 1999. 3. Right foot surgery in 2002. 4. Basal cell carcinoma removal from his back.  SOCIAL HISTORY:  The patient denies tobacco use and denies alcohol use.  He is married.  He has two grown children.  His wife will be available to help him postoperatively.  He does live in a two-level home.  FAMILY HISTORY:  Father deceased at age 78 secondary to a brain tumor.  Mother deceased at age 54 of unknown cause.  REVIEW OF SYSTEMS:  The patient denies any fevers, chills, sweats, or bleeding tendencies.  CNS:  Denies any blurred vision, double vision, headaches, seizures, or paralysis.  Cardiovascular:  Denies any chest pain, angina, orthopnea, claudication, or  palpitations.  Pulmonary:  Denies any shortness of breath, productive cough, or hemoptysis.  GI:  Denies nausea, vomiting, constipation, diarrhea, melena, or bloody stools.  GU:  Denies dysuria, hematuria, or discharge.  Musculoskeletal:  The patient does have some paresthesias into his right upper extremity extending all the way down to his fingers.  He denies any paralysis, other paresthesias, or numbness.  PHYSICAL EXAMINATION:  VITAL SIGNS:  The blood pressure is 122/82, respirations 16 and unlabored, and pulse 80 and regular.  GENERAL APPEARANCE:  The patient is a 70 year old white male.  He is alert and oriented.  He is in no acute distress.  He well nourished and well groomed. He appears his stated age.  He is very pleasant and cooperative to examination.  HEENT:  The head is normocephalic and atraumatic.  Pupils are equal, round, and reactive to light.  Extraocular movements intact.  Nares patent bilaterally.  The pharynx is clear without any erythema or exudate.  NECK:  Soft to palpation.  No bruits appreciated.  No lymphadenopathy or thyromegaly noted.  CHEST:  Clear to auscultation bilaterally.  No rales, rhonchi, stridor, wheezes, or friction rubs.  BREASTS:  Not pertinent and not performed.  HEART:  S1 and S2.  Regular rate and rhythm.  No murmurs, rubs, or gallops noted.  ABDOMEN:  Soft to palpation.  Positive bowel  sounds throughout.  Nontender and nondistended.  No organomegaly noted.  GENITOURINARY:  Not pertinent and not performed.  EXTREMITIES:  The patient complains of pain in his neck extending down his right arm.  He has paresthesias into his right fingers.  Sensation is grossly intact to light touch and equal throughout bilateral upper extremities. Radial pulses intact and equal bilaterally.  SKIN:  Intact without any lesions or rashes.  IMPRESSION: 1. Cervical spondylitic radiculopathy. 2. Gastroesophageal reflux disease. 3. History of basal cell  carcinoma.  PLAN:  Admit to [facility] [facility] [employer] [facility] on August 25, 2001, for an anterior cervical diskectomy and fusion at levels C4-5, C5-6, and C6-7.  This will be done by Patricia Nettle, M.D.  The patients primary care physician is W. Holley Bouche, M.D.  He has sent a letter of medical clearance indicating that the patient should be fine for surgery as proposed by Patricia Nettle, M.D. He has recently undergone general anesthesia and he did fine in that regard. Dictated by:   Sammuel Cooper. Mahar, P.A. Attending:  Patricia Nettle, M.D. DD:  08/17/01 TD:  08/17/01 Job: [id number]

## 2010-11-08 NOTE — Discharge Summary (Signed)
   NAME:  Kent [name], Kent [name]                          ACCOUNT NO.:  [id number]   MEDICAL RECORD NO.:  [id number]                   PATIENT TYPE:  INP   LOCATION:  0462                                 FACILITY:  [facility]   PHYSICIAN:  Thornton Park. Daphine Deutscher, M.D.             DATE OF BIRTH:  1941/02/20   DATE OF ADMISSION:  05/17/2002  DATE OF DISCHARGE:  05/25/2002                                 DISCHARGE SUMMARY   ADMISSION DIAGNOSIS:  Acute abdomen.   PROCEDURE:  Exploratory laparotomy with subtotal colectomy and ileostomy.   PATHOLOGY:  With necrosis associated with acute inflammation consistent with  ischemic necrosis.   HOSPITAL COURSE:  The patient is a 70 year old man admitted by Dr. Orson Slick on  May 17, 2002, with increasing severe abdominal pain.  He was taken to  the OR where the above mentioned operation was performed.  Postoperatively,  he has done well with his ileostomy and has adapted to it.  His wound has a  few staples and was allowed to drain as needed.  Staples were maintained in  place and his wound has dried up.  He does have problems with acid reflux  disease and takes omeprazole at home which has been given by Dr. Vida Rigger.  His diet was advanced and he tolerated that well.  He has a preexisting  severe back pain for which he has been taking Percocet 10/325 at home.   DISPOSITION:  He is ready for discharge on May 25, 2002.   DISCHARGE INSTRUCTIONS:  The instructions have been given regarding ostomy  management.   DISCHARGE MEDICATIONS:  A prescription is written for Percocet 10/325.   FOLLOW UP:  He is instructed to return to the office in three days (Friday)  for his staple removal probably by nursing.  He can follow up with Dr.  Orson Slick in approximately 10 days to two weeks.   FINAL DIAGNOSIS:  Ischemic necrosis of the colon, status post subtotal  colectomy.                                               Thornton Park Daphine Deutscher, M.D.    MBM/MEDQ  D:   05/25/2002  T:  05/25/2002  Job:  [id number]

## 2010-11-08 NOTE — Op Note (Signed)
   NAME:  Kent [name], Kent [name]                          ACCOUNT NO.:  [id number]   MEDICAL RECORD NO.:  [id number]                   PATIENT TYPE:  OIB   LOCATION:  5024                                 FACILITY:  [facility]   PHYSICIAN:  Patricia Nettle, M.D.                  DATE OF BIRTH:  04/24/1941   DATE OF PROCEDURE:  03/24/2002  DATE OF DISCHARGE:                                 OPERATIVE REPORT   ADDENDUM:  It should be noted that during the medial facetectomy, a synovial  cyst was encountered.  This did appear to project into the lateral recess.  It had become adherent to the ligamentum flavum and underlying L5 nerve  root.  The cyst was carefully dissected free using microdissection  technique.                                               Patricia Nettle, M.D.    MWC/MEDQ  D:  03/24/2002  T:  03/26/2002  Job:  [id number]

## 2010-11-08 NOTE — Op Note (Signed)
NAME:  Kent [name], Kent [name]                          ACCOUNT NO.:  [id number]   MEDICAL RECORD NO.:  [id number]                   PATIENT TYPE:  OIB   LOCATION:  2875                                 FACILITY:  [facility]   PHYSICIAN:  Patricia Nettle, M.D.                  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   PROCEDURE:  Microsurgical right L4-5 lateral recess decompression.   SURGEON:  Patricia Nettle, M.D.   ASSISTANT:  Verlin Fester, P.A.   ANESTHESIA:  General endotracheal.   COMPLICATIONS:  None.   INDICATIONS:  The patient is a 70 year old patient of mine who is status  post a three-level ACDF.  He has done very well from this procedure with  dramatic improvement in his neck and upper extremity pain.  Unfortunately,  over the past several months he has developed increasing pain in his right  lower extremity consistent with an L5 radiculopathy.  We have tried  medications, including escalating doses of narcotics.  He has a selective  nerve root injection on the right at L5, which gave him a short period of  relief.  His MRI scan shows a broad-based disk bulge at L4-5 with facet  hypertrophy and lateral recess stenosis.  Risks, benefits, and alternatives  of microsurgical decompression were discussed extensively with the patient.  He elected to proceed.   DESCRIPTION OF PROCEDURE:  The patient was properly identified in the  holding area and taken to the operating room.  He underwent general  endotracheal anesthesia without difficulty.  He was given prophylactic IV  antibiotics.  He was turned prone on the Acromed four-poster positioning  frame.  All bony prominences were padded.  A spinal needle was placed over  the L4-5 interspace and an x-ray taken confirming this location.  The back  was prepped and  draped in the usual sterile fashion.  A 2 cm incision was  made directly over the L4-5 interspace.  This was carried down to the deep  fascia.  The deep fascia was incised and the paraspinal muscles were  stripped subperiosteally on the right out to the L4-5 facet joint.  Care was  taken to protect the facet joint capsule.  We then placed our deep  retractor.  A curette was placed near the L4 lamina and intraoperative x-ray  confirmed this position.  We then performed a laminotomy using a high-speed  bur.  The inferior one-third of the L4 lamina was removed as well as the  medial one-third of the facet joint.  The microscope was draped and brought  into the surgical field, and the remainder of the procedure was done under  microscopic illumination.  The ligamentum flavum was released superiorly,  laterally, and inferiorly, creating a flap.  This was lifted up, and we  identified the L5 nerve root in the lateral recess.  The overhanging L5  facet was removed flush with the L5 pedicle.  We retracted the nerve root  medially and identified the disk.  Epidural bleeders were controlled with  bipolar electrocautery.  The disk was examined and although there was a  bulge, it did not appear to be causing any discrete compression after the  partial facetectomy.  Kerrison punches were used to complete a foraminotomy.  A blunt probe was used to confirm that the L5 nerve root was patent out the  foramen.  The wound was copiously irrigated.  The nerve was inspected again  and found to be free from the takeoff all the way out the foramen.  Bleeding  was controlled with Gelfoam and bipolar electrocautery.  Fentanyl 100 mcg  was left in the epidural space for postoperative anesthesia.  The ligamentum  flavum was flapped back over the nerve root.  The deep fascia was closed  with an 0 Vicryl running suture, followed  by 2-0 Vicryl in the subcutaneous layer and then a running 4-0 Vicryl.  Benzoin and  Steri-Strips were placed.  Sterile dressing applied.  The  patient turned supine, extubated without difficulty and transferred to the  recovery room able to move his upper and lower extremities.                                               Patricia Nettle, M.D.    MWC/MEDQ  D:  03/24/2002  T:  03/26/2002  Job:  [id number]

## 2010-11-08 NOTE — Op Note (Signed)
NAME:  Kent [name], Kent [name]                          ACCOUNT NO.:  [id number]   MEDICAL RECORD NO.:  [id number]                   PATIENT TYPE:  INP   LOCATION:  0160                                 [facility]ACILITY:  [facility]   PHYSICIAN:  Lorre Munroe., M.D.            DATE OF BIRTH:  04-02-1941   DATE OF PROCEDURE:  05/18/2002  DATE OF DISCHARGE:                                 OPERATIVE REPORT   PREOPERATIVE DIAGNOSIS:  Acute abdomen.   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   PROCEDURE:  Total abdominal colectomy with ileostomy and stapling of the  rectum.   SURGEON:  Lebron Conners, M.D.   ASSISTANT:  Thornton Park. Daphine Deutscher, M.D.   ANESTHESIA:  General.   DESCRIPTION OF PROCEDURE:  After the patient was monitored and anesthetized  and had routine preparation and draping of the abdomen and insertion of a  Foley catheter, I made a short midline incision and entered the abdomen  carefully.  I found a great deal of very dark brown, cloudy fluid and took a  culture.  I made a large enough incision to examine the colon and saw that  the sigmoid colon was entirely infarcted.  I then made a longer incision and  saw that the necrosis went all the way to the splenic flexure and beyond and  that the right-sided colon also had an unhealthy appearance.  The rectum was  normal in appearance.  The small bowel was normal in appearance.  A large  part of the colon had obvious transmural infarction.  There was a pulse  palpable in the superior mesenteric artery.  I mobilized the sigmoid and  descending colon and then stapled across the rectum where it appeared  healthy with a cutting stapler.  I then carried the dissection up the  descending colon until I came to the splenic flexure, and I carefully  separated adhesions to the area of the spleen and diaphragm and then began  to take down the distal part of the omentum.  Since I thought that the right  side of the colon might possibly warrant saving it, I stapled across the  colon just to the right of the mid-transverse colon and then worked back  toward the splenic flexure.  When I had mobilized the attachments of the  splenic flexure, I segmentally divided mesentery between clamps and ligated  the vessels with 2-0 silk.  I took great care to avoid injury to the left  ureter.  I then opened the most proximal segment of the bowel and found that  there was obvious near-complete necrosis of the mucosa.  I felt that it  would be very risky to leave any of the colon in under that circumstance,  and so I mobilized the right colon at hepatic flexure and similarly  separated the omentum and divided the mesentery between clamps and  ligated  the vessels with 2-0 silk.  I put an Allen clamp across the distal ileum and  a Kocher across beyond that and divided it and removed the right colon.  I  then mobilized the distal ileum a little bit and took down a little bit of  the mesentery in order to bring out then the ileostomy.  I then copiously  irrigated the abdominal cavity and removed the irrigant.  I got good  hemostasis.  Sponge, needle, and instrument counts were correct.  The  nasogastric tube was well-positioned.  I marked the rectal stump with two  sutures of 2-0 Prolene, leaving very long tails.  I again inspected the  small bowel and felt it looked good.  I made a hole about halfway between  the umbilicus and anterior superior iliac spine and cut out some of the fat,  incised the anterior rectus sheath, used the Bovie to divide the rectus and  the posterior sheath, and then brought the distal ileum through.  It came  through nicely.  I secured it inside with three sutures of 3-0 silk.  Then I  closed the abdomen with running #1 PDS and closed the skin loosely with  staples and packed moist gauze into the interstices.  I matured the  ileostomy after cutting away the portion that had been  crushed, making a  Brooke-type ileostomy by suturing with 4-0 Vicryl the skin and then the  seromuscular layer of the proximal bowel, and then the full thickness  of the edge of the ileostomy, and it made a very nice nipple.  The mucosa  appeared to be healthy.  I applied an appliance that fit well.  After having  application of a bandage and awakening from anesthesia, the patient went to  intensive care in stable condition.                                               Lorre Munroe., M.D.    WB/MEDQ  D:  05/18/2002  T:  05/18/2002  Job:  [id number]

## 2010-11-08 NOTE — Discharge Summary (Signed)
   NAME:  Kent [name], GAUSS                          ACCOUNT NO.:  [id number]   MEDICAL RECORD NO.:  [id number]                   PATIENT TYPE:  INP   LOCATION:  0374                                 FACILITY:  [facility]   PHYSICIAN:  Lorre Munroe., M.D.            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  I had operated on him and done an ileostomy with  stapling of the rectosigmoid and he had recovered well.  He came back to me  for closure of his ileostomy.  I had done a sigmoidoscopy showing a healthy  rectal segment.  The ileostomy had been functioning well and the patient had  been eating well and gaining weight.  He has some chronic back problems and  the dependency upon narcotics but otherwise generally healthy.   HOSPITAL COURSE:  On the day of admission, he underwent ileostomy closure.  I tried to do this laparoscopically but was not able to complete the  procedure in that way because of technical problems with the anastomosis  with a transanal EEA stapler.  I opened him with a short incision and  completed the anastomosis and the patient did well postoperatively except  for a bit of ileus which had resolved very nicely at the time of discharge.  His bowels were loose and were moving several times a day as expected but  not diarrheal and his electrolytes were okay.  He felt very much like going  home on the date of discharge, and so on the sixth postoperatively day I let  him go home after having removed his staples and applying Steri-Strips to  the wound.  He is to follow up with me in the office in 2-3 weeks or if  problems arise.   DIAGNOSIS:  Functional ileostomy, status post colectomy for ischemic  colitis.   OPERATION:  Ileostomy closure.   CONDITION ON DISCHARGE:  Improved.                                               Lorre Munroe., M.D.    Jodi Marble  D:  10/27/2002  T:  10/27/2002  Job:  [id number]

## 2010-11-26 ENCOUNTER — Other Ambulatory Visit: Payer: Self-pay | Admitting: Rehabilitation

## 2010-11-26 DIAGNOSIS — M549 Dorsalgia, unspecified: Secondary | ICD-10-CM

## 2010-12-02 ENCOUNTER — Ambulatory Visit
Admission: RE | Admit: 2010-12-02 | Discharge: 2010-12-02 | Disposition: A | Discharge status: Home / Self Care | Payer: Medicare Other | Source: Ambulatory Visit | Attending: Rehabilitation | Admitting: Rehabilitation

## 2010-12-02 DIAGNOSIS — M549 Dorsalgia, unspecified: Secondary | ICD-10-CM

## 2012-04-04 ENCOUNTER — Emergency Department (HOSPITAL_COMMUNITY): Payer: Medicare Other

## 2012-04-04 ENCOUNTER — Inpatient Hospital Stay (HOSPITAL_COMMUNITY)
Admission: EM | Admit: 2012-04-04 | Discharge: 2012-04-09 | DRG: 390 | Disposition: A | Discharge status: Home / Self Care | Payer: Medicare Other | Attending: General Surgery | Admitting: General Surgery

## 2012-04-04 ENCOUNTER — Inpatient Hospital Stay (HOSPITAL_COMMUNITY): Payer: Medicare Other

## 2012-04-04 ENCOUNTER — Encounter (HOSPITAL_COMMUNITY): Payer: Self-pay

## 2012-04-04 DIAGNOSIS — Z9049 Acquired absence of other specified parts of digestive tract: Secondary | ICD-10-CM

## 2012-04-04 DIAGNOSIS — G8929 Other chronic pain: Secondary | ICD-10-CM | POA: Diagnosis present

## 2012-04-04 DIAGNOSIS — K56609 Unspecified intestinal obstruction, unspecified as to partial versus complete obstruction: Principal | ICD-10-CM | POA: Diagnosis present

## 2012-04-04 DIAGNOSIS — K219 Gastro-esophageal reflux disease without esophagitis: Secondary | ICD-10-CM | POA: Diagnosis present

## 2012-04-04 DIAGNOSIS — Z79899 Other long term (current) drug therapy: Secondary | ICD-10-CM

## 2012-04-04 DIAGNOSIS — K8020 Calculus of gallbladder without cholecystitis without obstruction: Secondary | ICD-10-CM | POA: Diagnosis present

## 2012-04-04 DIAGNOSIS — Z8719 Personal history of other diseases of the digestive system: Secondary | ICD-10-CM

## 2012-04-04 LAB — COMPREHENSIVE METABOLIC PANEL
ALT: 36 U/L (ref 0–53)
AST: 32 U/L (ref 0–37)
Alkaline Phosphatase: 90 U/L (ref 39–117)
CO2: 26 mEq/L (ref 19–32)
Calcium: 11.1 mg/dL — ABNORMAL HIGH (ref 8.4–10.5)
Chloride: 99 mEq/L (ref 96–112)
GFR calc Af Amer: >90 mL/min (ref >90)
GFR calc non Af Amer: 84 mL/min — ABNORMAL LOW (ref >90)
Glucose, Bld: 177 mg/dL — ABNORMAL HIGH (ref 70–99)
Potassium: 4.2 mEq/L (ref 3.5–5.1)
Sodium: 138 mEq/L (ref 135–145)

## 2012-04-04 LAB — PROTIME-INR: INR: 0.99 (ref 0.00–1.49)

## 2012-04-04 LAB — CBC WITH DIFFERENTIAL/PLATELET
Basophils Absolute: 0 10*3/uL (ref 0.0–0.1)
Eosinophils Relative: 0 % (ref 0–5)
Lymphocytes Relative: 7 % — ABNORMAL LOW (ref 12–46)
Lymphs Abs: 0.7 10*3/uL (ref 0.7–4.0)
MCV: 84.9 fL (ref 78.0–100.0)
Neutro Abs: 9.1 10*3/uL — ABNORMAL HIGH (ref 1.7–7.7)
Neutrophils Relative %: 89 % — ABNORMAL HIGH (ref 43–77)
Platelets: 244 10*3/uL (ref 150–400)
RBC: 5.48 MIL/uL (ref 4.22–5.81)
RDW: 13.1 % (ref 11.5–15.5)
WBC: 10.2 10*3/uL (ref 4.0–10.5)

## 2012-04-04 LAB — CREATININE, SERUM
Creatinine, Ser: 0.91 mg/dL (ref 0.50–1.35)
GFR calc Af Amer: >90 mL/min (ref >90)
GFR calc non Af Amer: 84 mL/min — ABNORMAL LOW (ref >90)

## 2012-04-04 LAB — CBC
MCHC: 34.3 g/dL (ref 30.0–36.0)
Platelets: 195 10*3/uL (ref 150–400)
RDW: 13.5 % (ref 11.5–15.5)
WBC: 10.6 10*3/uL — ABNORMAL HIGH (ref 4.0–10.5)

## 2012-04-04 MED ORDER — ACETAMINOPHEN 650 MG RE SUPP: 650.0000 mg | Freq: Four times a day (QID) | RECTAL | Status: DC | PRN

## 2012-04-04 MED ORDER — ONDANSETRON HCL 4 MG/2ML IJ SOLN
INTRAMUSCULAR | Status: AC
  Filled 2012-04-04: qty 2

## 2012-04-04 MED ORDER — PHENOL 1.4 % MT LIQD
1.0000 | OROMUCOSAL | Status: DC | PRN
  Filled 2012-04-04: qty 177

## 2012-04-04 MED ORDER — SODIUM CHLORIDE 0.9 % IV BOLUS (SEPSIS)
1000.0000 mL | Freq: Once | INTRAVENOUS | Status: AC
  Administered 2012-04-04: 1000 mL via INTRAVENOUS

## 2012-04-04 MED ORDER — ACETAMINOPHEN 325 MG PO TABS: 650.0000 mg | ORAL_TABLET | Freq: Four times a day (QID) | ORAL | Status: DC | PRN

## 2012-04-04 MED ORDER — SODIUM CHLORIDE 0.9 % IV SOLN
INTRAVENOUS | Status: DC
  Administered 2012-04-04 – 2012-04-05 (×3): via INTRAVENOUS
  Administered 2012-04-06: 1000 mL via INTRAVENOUS
  Administered 2012-04-07: 17:00:00 via INTRAVENOUS

## 2012-04-04 MED ORDER — MORPHINE SULFATE 2 MG/ML IJ SOLN
2.0000 mg | INTRAMUSCULAR | Status: DC | PRN
  Administered 2012-04-04 – 2012-04-09 (×19): 2 mg via INTRAVENOUS
  Filled 2012-04-04 (×19): qty 1

## 2012-04-04 MED ORDER — HYDROMORPHONE HCL PF 1 MG/ML IJ SOLN
1.0000 mg | Freq: Once | INTRAMUSCULAR | Status: AC
  Administered 2012-04-04: 1 mg via INTRAVENOUS
  Filled 2012-04-04: qty 1

## 2012-04-04 MED ORDER — IOHEXOL 300 MG/ML  SOLN
80.0000 mL | Freq: Once | INTRAMUSCULAR | Status: AC | PRN
  Administered 2012-04-04: 80 mL via INTRAVENOUS

## 2012-04-04 MED ORDER — BIOTENE DRY MOUTH MT LIQD
15.0000 mL | Freq: Two times a day (BID) | OROMUCOSAL | Status: DC
  Administered 2012-04-05 – 2012-04-07 (×5): 15 mL via OROMUCOSAL

## 2012-04-04 MED ORDER — HEPARIN SODIUM (PORCINE) 5000 UNIT/ML IJ SOLN
5000.0000 [IU] | Freq: Three times a day (TID) | INTRAMUSCULAR | Status: DC
  Administered 2012-04-04 – 2012-04-08 (×12): 5000 [IU] via SUBCUTANEOUS
  Filled 2012-04-04 (×20): qty 1

## 2012-04-04 MED ORDER — ONDANSETRON HCL 4 MG/2ML IJ SOLN
INTRAMUSCULAR | Status: AC
  Administered 2012-04-04: 4 mg
  Filled 2012-04-04: qty 2

## 2012-04-04 MED ORDER — PROMETHAZINE HCL 25 MG/ML IJ SOLN
12.5000 mg | Freq: Once | INTRAMUSCULAR | Status: AC
  Administered 2012-04-04: 11:00:00 via INTRAVENOUS
  Filled 2012-04-04: qty 1

## 2012-04-04 MED ORDER — PANTOPRAZOLE SODIUM 40 MG IV SOLR
40.0000 mg | Freq: Every day | INTRAVENOUS | Status: DC
  Administered 2012-04-04 – 2012-04-07 (×4): 40 mg via INTRAVENOUS
  Filled 2012-04-04 (×6): qty 40

## 2012-04-04 MED ORDER — CHLORHEXIDINE GLUCONATE 0.12 % MT SOLN
15.0000 mL | Freq: Two times a day (BID) | OROMUCOSAL | Status: DC
Start: 2012-04-04 — End: 2012-04-08
  Administered 2012-04-04 – 2012-04-08 (×7): 15 mL via OROMUCOSAL
  Filled 2012-04-04 (×8): qty 15

## 2012-04-04 MED ORDER — ONDANSETRON HCL 4 MG/2ML IJ SOLN
4.0000 mg | Freq: Four times a day (QID) | INTRAMUSCULAR | Status: DC | PRN
  Administered 2012-04-05 – 2012-04-06 (×2): 4 mg via INTRAVENOUS
  Filled 2012-04-04 (×2): qty 2

## 2012-04-04 MED ORDER — HYDROMORPHONE HCL PF 1 MG/ML IJ SOLN
1.0000 mg | INTRAMUSCULAR | Status: DC | PRN
  Administered 2012-04-04 – 2012-04-07 (×4): 1 mg via INTRAVENOUS
  Filled 2012-04-04 (×4): qty 1

## 2012-04-04 MED ORDER — ONDANSETRON HCL 4 MG/2ML IJ SOLN
4.0000 mg | Freq: Once | INTRAMUSCULAR | Status: AC
  Administered 2012-04-04: 4 mg via INTRAVENOUS

## 2012-04-04 NOTE — ED Provider Notes (Signed)
History     CSN: [id number]  Arrival date & time 04/04/12  0901   First MD Initiated Contact with Patient 04/04/12 [phone]      Chief Complaint  Patient presents with  . Abdominal Pain    (Consider location/radiation/quality/duration/timing/severity/associated sxs/prior treatment) HPI  71 y.o. male complaining of 10 out of 10 right lower quadrant pain starting at 5 PM last night with nausea and no vomiting. Patient has not been passing gas and has not had a bowel movement since Friday (2 days ago). Patient had an ileostomy and reversal in 2003. In 2010 he had SBO secondary to scar tissue. Patient states that this episode feels the same as that episode. Pain is exacerbated by any by mouth intake. He has extensive pain medications at home has not used any because he is afraid to take anything by mouth due to pain exacerbation. He denies fever, chest pain, palpitations, or shortness of breath.   History reviewed. No pertinent past medical history.  Past Surgical History  Procedure Date  . Abdominal surgery   . Colon surgery   . Back surgery   . Incision and drainage foot   . Knee arthroscopy   . Shoulder arthroscopy     No family history on file.  History  Substance Use Topics  . Smoking status: Never Smoker   . Smokeless tobacco: Not on file  . Alcohol Use: No      Review of Systems  Constitutional: Negative for fever.  Respiratory: Negative for shortness of breath.   Cardiovascular: Negative for chest pain.  Gastrointestinal: Positive for nausea, abdominal pain, constipation and abdominal distention. Negative for vomiting and diarrhea.  All other systems reviewed and are negative.    Allergies  Review of patient's allergies indicates no known allergies.  Home Medications   Current Outpatient Rx  Name Route Sig Dispense Refill  . ESOMEPRAZOLE MAGNESIUM 40 MG PO CPDR Oral Take 40 mg by mouth daily before breakfast.    . HYDROCODONE-ACETAMINOPHEN 5-500 MG PO TABS  Oral Take 1 tablet by mouth every 8 (eight) hours as needed. For pain    . METHOCARBAMOL 500 MG PO TABS Oral Take 1,000 mg by mouth 2 (two) times daily as needed. For muscle spasms.    . OXYCODONE HCL ER 20 MG PO TB12 Oral Take 20 mg by mouth every 12 (twelve) hours.      BP 129/82  Pulse 104  Temp 98 F (36.7 C) (Oral)  Resp 16  SpO2 99%  Physical Exam  Nursing note and vitals reviewed. Constitutional: He is oriented to person, place, and time. He appears well-developed and well-nourished. No distress.  HENT:  Head: Normocephalic.  Mouth/Throat: Oropharynx is clear and moist.  Eyes: Conjunctivae normal and EOM are normal. Pupils are equal, round, and reactive to light.  Neck: Normal range of motion.  Cardiovascular: Normal rate.   Pulmonary/Chest: Effort normal and breath sounds normal. No stridor. No respiratory distress. He has no wheezes. He has no rales. He exhibits no tenderness.  Abdominal: Soft. Bowel sounds are normal. He exhibits no distension and no mass. There is tenderness. There is no rebound and no guarding.       Several healed surgical scars, mild distention. Tender to light palpation of right lower [place] and suprapubic area no peritoneal signs.  Musculoskeletal: Normal range of motion.  Neurological: He is alert and oriented to person, place, and time.  Skin: Skin is warm.  Psychiatric: He has a normal mood and affect.  ED Course  Procedures (including critical care time)  Labs Reviewed  CBC WITH DIFFERENTIAL - Abnormal; Notable for the following:    Neutrophils Relative 89 (*)     Neutro Abs 9.1 (*)     Lymphocytes Relative 7 (*)     All other components within normal limits  COMPREHENSIVE METABOLIC PANEL - Abnormal; Notable for the following:    Glucose, Bld 177 (*)     Calcium 11.1 (*)     Total Protein 8.8 (*)     GFR calc non Af Amer 84 (*)     All other components within normal limits   Ct Abdomen Pelvis W Contrast  04/04/2012  *RADIOLOGY  REPORT*  Clinical Data: The right lower quadrant pain.  Nausea.  Previous bowel resection for ischemic bowel.  CT ABDOMEN AND PELVIS WITH CONTRAST  Technique:  Multidetector CT imaging of the abdomen and pelvis was performed following the standard protocol during bolus administration of intravenous contrast.  Contrast: 80mL OMNIPAQUE IOHEXOL 300 MG/ML  SOLN  Comparison: 03/05/2009  Findings: Patient has undergone previous sub total colectomy. Multiple moderately dilated small bowel loops are seen within the abdomen and pelvis. There is a transition point in the central upper pelvis near the ileocolic anastomosis which is associated with a swirled appearance of the associated mesenteric fat.  This is consistent with a distal small bowel obstruction near the ileocolic anastomosis which could be due to an internal hernia or adhesion.  No evidence of bowel wall thickening or pneumatosis.  A small amount of ascites is seen within the abdomen and pelvis.  No hernia identified.  A contracted gallbladder is seen with tiny gallstones.  No evidence of cholecystitis.  The abdominal parenchymal organs are normal in appearance.  No evidence of hydronephrosis.  No soft tissue masses or lymphadenopathy identified.  IMPRESSION:  1.  Previous sub total colectomy.  Distal small bowel obstruction in the central upper pelvis near the ileocolic anastomosis, which is suspicious for an internal hernia or adhesion. 2.  No mass or lymphadenopathy identified. 3.  Small amount of ascites. 4.  Cholelithiasis, without evidence of cholecystitis.   Original Report Authenticated By: Danae Orleans, M.D.     Date: 04/04/2012  Rate: 69  Rhythm: normal sinus rhythm  QRS Axis: normal  Intervals: normal  ST/T Wave abnormalities: normal  Conduction Disutrbances:none  Narrative Interpretation:   Old EKG Reviewed: unchanged   1. SBO (small bowel obstruction)       MDM  Discussed case with attending Dr. Radford Pax who feels that patient will  not be able to tolerate by mouth Contrast and that IV only CT is indicated at this time.  Physical exam is reassuring for no peritoneal signs. Also, patient does not have a leukocytosis.  Glucose and calcium are elevated I will bolus the patient at this time.  11:17 AM patient rates his pain as improved at 7/10 nausea still remains I will give him 4 mg of Zofran while we wait for a 0.5 mg of Phenergan. I will also put PRN orders for Dilaudid.   12:48 PM patient reports resolution of nausea but pain is still severe at 6/10. Advise nurse to utilize when necessary Dilaudid order.   CT shows distal small bowel obstruction near the ileocolic anastomosis. Discussed results with attending Dr. Radford Pax who will consult surgery.        Wynetta Emery, PA-C 04/04/12 1526

## 2012-04-04 NOTE — ED Provider Notes (Signed)
Medical screening examination/treatment/procedure(s) were conducted as a shared visit with non-physician practitioner(s) and myself.  I personally evaluated the patient during the encounter    Khylan Sawyer L Nas Wafer, MD 04/04/12 2158 

## 2012-04-04 NOTE — H&P (Signed)
Kent [name] is an 71 y.o. male.   Chief Complaint: abdominal pain, consult from Dr. Mellody Memos HPI: 63 yom with history of ischemic colon after lumbar fusion resulting in what appears to be STC with ileostomy.  Later underwent ileostomy takedown. In 2010, he was taken to OR by one of my partners for small bowel obstruction from a volvulus appears around anastomosis.  He has done well since then. He is otherwise healthy except for chronic back pain for which he is on narcotics and is being evaluated for a stimulator.  Yesterday he developed mid abdominal pain which has worsened to where this am he was unable to walk.  He had a small bm last night but bms have been smaller and harder over past few weeks.  He has not passed flatus in last couple of days. Denies fevers.  He states he did eat a few oysters and shrimp last night. He has significant nausea but no emesis.  Arthritis, lbp   Past Surgical History  Procedure Date  . Abdominal surgery   . Colon surgery   . Back surgery   . Incision and drainage foot   . Knee arthroscopy   . Shoulder arthroscopy   see above, cervical and lumbar fusion, bilateral shoulder asc, right knee asc, right foot surgery   No family history on file. Social History:  reports that he has never smoked. He does not have any smokeless tobacco history on file. He reports that he does not drink alcohol or use illicit drugs.  Allergies: No Known Allergies  Meds oxycodone, methacarbamol, esmoprazole   Results for orders placed during the hospital encounter of 04/04/12 (from the past 48 hour(s))  CBC WITH DIFFERENTIAL     Status: Abnormal   Collection Time   04/04/12 10:20 AM      Component Value Range Comment   WBC 10.2  4.0 - 10.5 K/uL    RBC 5.48  4.22 - 5.81 MIL/uL    Hemoglobin 16.6  13.0 - 17.0 g/dL    HCT 16.1  09.6 - 04.5 %    MCV 84.9  78.0 - 100.0 fL    MCH 30.3  26.0 - 34.0 pg    MCHC 35.7  30.0 - 36.0 g/dL    RDW 40.9  81.1 - 91.4 %    Platelets 244   150 - 400 K/uL    Neutrophils Relative 89 (*) 43 - 77 %    Neutro Abs 9.1 (*) 1.7 - 7.7 K/uL    Lymphocytes Relative 7 (*) 12 - 46 %    Lymphs Abs 0.7  0.7 - 4.0 K/uL    Monocytes Relative 4  3 - 12 %    Monocytes Absolute 0.4  0.1 - 1.0 K/uL    Eosinophils Relative 0  0 - 5 %    Eosinophils Absolute 0.0  0.0 - 0.7 K/uL    Basophils Relative 0  0 - 1 %    Basophils Absolute 0.0  0.0 - 0.1 K/uL   COMPREHENSIVE METABOLIC PANEL     Status: Abnormal   Collection Time   04/04/12 10:20 AM      Component Value Range Comment   Sodium 138  135 - 145 mEq/L    Potassium 4.2  3.5 - 5.1 mEq/L    Chloride 99  96 - 112 mEq/L    CO2 26  19 - 32 mEq/L    Glucose, Bld 177 (*) 70 - 99 mg/dL    BUN  9  6 - 23 mg/dL    Creatinine, Ser 7.82  0.50 - 1.35 mg/dL    Calcium 95.6 (*) 8.4 - 10.5 mg/dL    Total Protein 8.8 (*) 6.0 - 8.3 g/dL    Albumin 4.8  3.5 - 5.2 g/dL    AST 32  0 - 37 U/L    ALT 36  0 - 53 U/L    Alkaline Phosphatase 90  39 - 117 U/L    Total Bilirubin 0.8  0.3 - 1.2 mg/dL    GFR calc non Af Amer 84 (*) >90 mL/min    GFR calc Af Amer >90  >90 mL/min   LACTIC ACID, PLASMA     Status: Normal   Collection Time   04/04/12  1:56 PM      Component Value Range Comment   Lactic Acid, Venous 0.7  0.5 - 2.2 mmol/L   PROTIME-INR     Status: Normal   Collection Time   04/04/12  2:03 PM      Component Value Range Comment   Prothrombin Time 13.0  11.6 - 15.2 seconds    INR 0.99  0.00 - 1.49    Ct Abdomen Pelvis W Contrast  04/04/2012  *RADIOLOGY REPORT*  Clinical Data: The right lower quadrant pain.  Nausea.  Previous bowel resection for ischemic bowel.  CT ABDOMEN AND PELVIS WITH CONTRAST  Technique:  Multidetector CT imaging of the abdomen and pelvis was performed following the standard protocol during bolus administration of intravenous contrast.  Contrast: 80mL OMNIPAQUE IOHEXOL 300 MG/ML  SOLN  Comparison: 03/05/2009  Findings: Patient has undergone previous sub total colectomy. Multiple  moderately dilated small bowel loops are seen within the abdomen and pelvis. There is a transition point in the central upper pelvis near the ileocolic anastomosis which is associated with a swirled appearance of the associated mesenteric fat.  This is consistent with a distal small bowel obstruction near the ileocolic anastomosis which could be due to an internal hernia or adhesion.  No evidence of bowel wall thickening or pneumatosis.  A small amount of ascites is seen within the abdomen and pelvis.  No hernia identified.  A contracted gallbladder is seen with tiny gallstones.  No evidence of cholecystitis.  The abdominal parenchymal organs are normal in appearance.  No evidence of hydronephrosis.  No soft tissue masses or lymphadenopathy identified.  IMPRESSION:  1.  Previous sub total colectomy.  Distal small bowel obstruction in the central upper pelvis near the ileocolic anastomosis, which is suspicious for an internal hernia or adhesion. 2.  No mass or lymphadenopathy identified. 3.  Small amount of ascites. 4.  Cholelithiasis, without evidence of cholecystitis.   Original Report Authenticated By: Danae Orleans, M.D.     Review of Systems  Constitutional: Negative for fever, chills and weight loss.  Gastrointestinal: Positive for nausea and abdominal pain. Negative for vomiting, diarrhea and blood in stool.    Blood pressure 124/66, pulse 104, temperature 98 F (36.7 C), temperature source Oral, resp. rate 18, SpO2 98.00%. Physical Exam  Vitals reviewed. Constitutional: He appears well-developed and well-nourished. No distress.  Neck: Neck supple.  Cardiovascular: Normal rate, regular rhythm and normal heart sounds.   Respiratory: Effort normal and breath sounds normal. No respiratory distress. He has no wheezes. He has no rales.  GI: He exhibits distension. Bowel sounds are decreased. There is Tenderness: mild tenderness no peritoneal signs.. No hernia.    Skin: He is not diaphoretic.  Assessment/Plan SBO  He is not tachycardic now, has no peritoneal signs on exam.  He thinks this is similar episode and scan does show some swirling .  I think an attempt at conservative therapy given his clinical presentation right now is reasonable.  He and his wife understand he may yet need to go to operating room.  Will place ng tube, npo, recheck labs and films in am. If he worsens will take to OR>  [facility] 04/04/2012, 3:00 PM

## 2012-04-04 NOTE — ED Notes (Signed)
Pt with hx of ileostomy and reversal of same.  Pt also had additional surgery for scar tissue build up.  Pt presents today with c/o abdominal pain worsening since last night.  Pt states that the pain is similar to when he had scar tissue build up around his colon.  Pt states he eats very healthy and has BM daily with a "normal" consistency.  Pt states he had very loose stool on Friday and no BM since.

## 2012-04-05 LAB — BASIC METABOLIC PANEL
BUN: 8 mg/dL (ref 6–23)
Calcium: 9 mg/dL (ref 8.4–10.5)
Creatinine, Ser: 0.91 mg/dL (ref 0.50–1.35)
GFR calc Af Amer: >90 mL/min (ref >90)

## 2012-04-05 LAB — CBC
HCT: 35.3 % — ABNORMAL LOW (ref 39.0–52.0)
MCHC: 34.3 g/dL (ref 30.0–36.0)
Platelets: 157 10*3/uL (ref 150–400)
RDW: 13.4 % (ref 11.5–15.5)
WBC: 5.6 10*3/uL (ref 4.0–10.5)

## 2012-04-05 NOTE — Progress Notes (Signed)
Subjective: 400 in NG cannister, had a BM last PM, feels better still distended, but not as much.   Objective: Vital signs in last 24 hours: Temp:  [97.2 F (36.2 C)-98.5 F (36.9 C)] 98.5 F (36.9 C) (10/14 0542) Pulse Rate:  [77-93] 86  (10/14 0542) Resp:  [13-18] 17  (10/14 0542) BP: (124-159)/(53-76) 147/73 mmHg (10/14 0542) SpO2:  [96 %-100 %] 97 % (10/14 0542) Weight:  [74.5 kg (164 lb 3.9 oz)] 74.5 kg (164 lb 3.9 oz) (10/13 1719) Last BM Date: 04/04/12 NPO, afebrile, VSS, labs OK, 2 view film last PM 2200 hrs, shows ongoing SBO  Intake/Output from previous day: 10/13 0701 - 10/14 0700 In: 1221.7 [I.V.:1201.7; NG/GT:20] Out: 650 [Urine:350; Emesis/NG output:300] Intake/Output this shift:    General appearance: alert, cooperative, no distress and NG looks like it is going to come out. GI: soft, less distended than last PM , still has a fair amount of Green NG draingage coming ffrom his NG some flatus, BS present, ? hypoactive.  Lab Results:   [facility] 04/05/12 0635 04/04/12 1744  WBC 5.6 10.6*  HGB 12.1* 13.9  HCT 35.3* 40.5  PLT 157 195    BMET  Basename 04/05/12 0635 04/04/12 1744 04/04/12 1020  NA 143 -- 138  K 3.9 -- 4.2  CL 108 -- 99  CO2 28 -- 26  GLUCOSE 102* -- 177*  BUN 8 -- 9  CREATININE 0.91 0.91 --  CALCIUM 9.0 -- 11.1*   PT/INR  Basename 04/04/12 1403  LABPROT 13.0  INR 0.99     Lab 04/04/12 1020  AST 32  ALT 36  ALKPHOS 90  BILITOT 0.8  PROT 8.8*  ALBUMIN 4.8     Lipase     Component Value Date/Time   LIPASE 17 07/03/2009 1311     Studies/Results: Ct Abdomen Pelvis W Contrast  04/04/2012  *RADIOLOGY REPORT*  Clinical Data: The right lower quadrant pain.  Nausea.  Previous bowel resection for ischemic bowel.  CT ABDOMEN AND PELVIS WITH CONTRAST  Technique:  Multidetector CT imaging of the abdomen and pelvis was performed following the standard protocol during bolus administration of intravenous contrast.  Contrast: 80mL  OMNIPAQUE IOHEXOL 300 MG/ML  SOLN  Comparison: 03/05/2009  Findings: Patient has undergone previous sub total colectomy. Multiple moderately dilated small bowel loops are seen within the abdomen and pelvis. There is a transition point in the central upper pelvis near the ileocolic anastomosis which is associated with a swirled appearance of the associated mesenteric fat.  This is consistent with a distal small bowel obstruction near the ileocolic anastomosis which could be due to an internal hernia or adhesion.  No evidence of bowel wall thickening or pneumatosis.  A small amount of ascites is seen within the abdomen and pelvis.  No hernia identified.  A contracted gallbladder is seen with tiny gallstones.  No evidence of cholecystitis.  The abdominal parenchymal organs are normal in appearance.  No evidence of hydronephrosis.  No soft tissue masses or lymphadenopathy identified.  IMPRESSION:  1.  Previous sub total colectomy.  Distal small bowel obstruction in the central upper pelvis near the ileocolic anastomosis, which is suspicious for an internal hernia or adhesion. 2.  No mass or lymphadenopathy identified. 3.  Small amount of ascites. 4.  Cholelithiasis, without evidence of cholecystitis.   Original Report Authenticated By: Danae Orleans, M.D.    Dg Abd 2 Views  04/04/2012  *RADIOLOGY REPORT*  Clinical Data: SBO, follow-up  ABDOMEN -  2 VIEW  Comparison: CT of abdomen and pelvis, 04/04/2012 at 12:10 p.m.  Findings: Dilated small bowel with air-fluid levels is without significant change from the earlier CT.  This is consistent with a moderate to high grade partial small bowel obstruction.  No free air.  A nasogastric tube passes below the diaphragm into the stomach.  Residual contrast is noted in the bladder.  The soft tissues are otherwise unremarkable.  No significant bony abnormality.  IMPRESSION: Persistent small bowel obstruction unchanged from the CT obtained earlier this same date.  No free air.    Original Report Authenticated By: Domenic Moras, M.D.     Medications:    . antiseptic oral rinse  15 mL Mouth Rinse q12n4p  . chlorhexidine  15 mL Mouth Rinse BID  . heparin  5,000 Units Subcutaneous Q8H  .  HYDROmorphone (DILAUDID) injection  1 mg Intravenous Once  . ondansetron      . ondansetron      . ondansetron (ZOFRAN) IV  4 mg Intravenous Once  . pantoprazole (PROTONIX) IV  40 mg Intravenous QHS  . promethazine  12.5 mg Intravenous Once  . sodium chloride  1,000 mL Intravenous Once  . sodium chloride  1,000 mL Intravenous Once    Assessment/Plan Distal SBO near anastomosis;  s/p subtotal colectomy  cholelitiaisis GERD Home meds; include OxyContin and hydrocodone for chronic back and knee pain for arthritis  Plan:  Continue NG, he's getting MS for pain.  Will recheck film and labs in AM       LOS: 1 day    Kent [name] 04/05/2012

## 2012-04-05 NOTE — Progress Notes (Signed)
Has had three liquid BMs and abdomen is feeling softer to him.  Pain less. I spoke to him and his family. Patient examined and I agree with the assessment and plan  Violeta Gelinas, MD, MPH, FACS Pager: [phone]  04/05/2012 2:24 PM

## 2012-04-06 ENCOUNTER — Inpatient Hospital Stay (HOSPITAL_COMMUNITY): Payer: Medicare Other

## 2012-04-06 LAB — BASIC METABOLIC PANEL
Calcium: 9 mg/dL (ref 8.4–10.5)
GFR calc non Af Amer: 84 mL/min — ABNORMAL LOW (ref >90)
Glucose, Bld: 66 mg/dL — ABNORMAL LOW (ref 70–99)
Potassium: 3.5 mEq/L (ref 3.5–5.1)
Sodium: 146 mEq/L — ABNORMAL HIGH (ref 135–145)

## 2012-04-06 LAB — MAGNESIUM: Magnesium: 1.8 mg/dL (ref 1.5–2.5)

## 2012-04-06 LAB — CBC
Hemoglobin: 12.2 g/dL — ABNORMAL LOW (ref 13.0–17.0)
MCH: 29.8 pg (ref 26.0–34.0)
MCHC: 34.5 g/dL (ref 30.0–36.0)
Platelets: 173 10*3/uL (ref 150–400)
RBC: 4.1 MIL/uL — ABNORMAL LOW (ref 4.22–5.81)

## 2012-04-06 NOTE — Progress Notes (Signed)
Multiple BMs, no residual after NGT clamp, X-rays better so will remove NGT Patient examined and I agree with the assessment and plan  Violeta Gelinas, MD, MPH, FACS Pager: [phone]  04/06/2012 2:31 PM

## 2012-04-06 NOTE — Progress Notes (Signed)
Patient ID: THANIEL COLUCCIO, male   DOB: 11/19/1940, 71 y.o.   MRN: [id number]    Subjective: Feels good this morning reports 3 BM's this am and feels like he is ready to have another.   Objective: Vital signs in last 24 hours: Temp:  [98.1 F (36.7 C)-98.6 F (37 C)] 98.5 F (36.9 C) (10/15 0518) Pulse Rate:  [82-97] 97  (10/15 0518) Resp:  [17-20] 18  (10/15 0518) BP: (138-152)/(63-70) 147/70 mmHg (10/15 0518) SpO2:  [96 %-99 %] 99 % (10/15 0518) Last BM Date: 04/06/12 NPO, afebrile, VSS, labs OK, 2 view film last PM 2200 hrs, shows ongoing SBO  Intake/Output from previous day: 10/14 0701 - 10/15 0700 In: 2322 [I.V.:2322] Out: 2000 [Urine:550; Emesis/NG output:1450] Intake/Output this shift:   General Appearance: A/A/O in no distress Chest: CTA Cardiac: RRR Abdomen:soft, non tender, + BS, BM; No bloating. NG remains to intermittent sxn ( of greenish output/24 hr)  Abdominal Films: Findings: Nasogastric tube is present with the proximal side port  in the second part of the duodenum. The tip of the nasogastric  tube is approaching the ligament of Treitz in the third part of the  duodenum. Persistent small bowel obstruction bowel gas pattern  however the diameter small bowel loops has decreased compared to  the prior exam 10/13, suggesting some resolution. Paucity of  colonic gas and stool. No free air.  IMPRESSION:  Improving small bowel obstruction.    Lab Results:   Basename 04/06/12 0440 04/05/12 0635  WBC 5.9 5.6  HGB 12.2* 12.1*  HCT 35.4* 35.3*  PLT 173 157    BMET  Basename 04/06/12 0440 04/05/12 0635  NA 146* 143  K 3.5 3.9  CL 109 108  CO2 25 28  GLUCOSE 66* 102*  BUN 11 8  CREATININE 0.92 0.91  CALCIUM 9.0 9.0   PT/INR  Basename 04/04/12 1403  LABPROT 13.0  INR 0.99     Lab 04/04/12 1020  AST 32  ALT 36  ALKPHOS 90  BILITOT 0.8  PROT 8.8*  ALBUMIN 4.8     Lipase     Component Value Date/Time   LIPASE 17 07/03/2009 1311       Studies/Results: Ct Abdomen Pelvis W Contrast  04/04/2012  *RADIOLOGY REPORT*  Clinical Data: The right lower quadrant pain.  Nausea.  Previous bowel resection for ischemic bowel.  CT ABDOMEN AND PELVIS WITH CONTRAST  Technique:  Multidetector CT imaging of the abdomen and pelvis was performed following the standard protocol during bolus administration of intravenous contrast.  Contrast: 80mL OMNIPAQUE IOHEXOL 300 MG/ML  SOLN  Comparison: 03/05/2009  Findings: Patient has undergone previous sub total colectomy. Multiple moderately dilated small bowel loops are seen within the abdomen and pelvis. There is a transition point in the central upper pelvis near the ileocolic anastomosis which is associated with a swirled appearance of the associated mesenteric fat.  This is consistent with a distal small bowel obstruction near the ileocolic anastomosis which could be due to an internal hernia or adhesion.  No evidence of bowel wall thickening or pneumatosis.  A small amount of ascites is seen within the abdomen and pelvis.  No hernia identified.  A contracted gallbladder is seen with tiny gallstones.  No evidence of cholecystitis.  The abdominal parenchymal organs are normal in appearance.  No evidence of hydronephrosis.  No soft tissue masses or lymphadenopathy identified.  IMPRESSION:  1.  Previous sub total colectomy.  Distal small bowel obstruction in the  central upper pelvis near the ileocolic anastomosis, which is suspicious for an internal hernia or adhesion. 2.  No mass or lymphadenopathy identified. 3.  Small amount of ascites. 4.  Cholelithiasis, without evidence of cholecystitis.   Original Report Authenticated By: Danae Orleans, M.D.    Dg Abd 2 Views  04/06/2012  *RADIOLOGY REPORT*  Clinical Data: Small bowel obstruction.  Back pain.  Abdominal discomfort.  ABDOMEN - 2 VIEW  Comparison: 04/04/2012.  Findings: Nasogastric tube is present with the proximal side port in the second part of the  duodenum.  The tip of the nasogastric tube is approaching the ligament of Treitz in the third part of the duodenum.  Persistent small bowel obstruction bowel gas pattern however the diameter small bowel loops has decreased compared to the prior exam 10/13, suggesting some resolution.  Paucity of colonic gas and stool. No free air.  IMPRESSION: Improving small bowel obstruction.  Nasogastric tube tip in the third part of the duodenum.   Original Report Authenticated By: Andreas Newport, M.D.    Dg Abd 2 Views  04/04/2012  *RADIOLOGY REPORT*  Clinical Data: SBO, follow-up  ABDOMEN - 2 VIEW  Comparison: CT of abdomen and pelvis, 04/04/2012 at 12:10 p.m.  Findings: Dilated small bowel with air-fluid levels is without significant change from the earlier CT.  This is consistent with a moderate to high grade partial small bowel obstruction.  No free air.  A nasogastric tube passes below the diaphragm into the stomach.  Residual contrast is noted in the bladder.  The soft tissues are otherwise unremarkable.  No significant bony abnormality.  IMPRESSION: Persistent small bowel obstruction unchanged from the CT obtained earlier this same date.  No free air.   Original Report Authenticated By: Domenic Moras, M.D.     Medications:    . antiseptic oral rinse  15 mL Mouth Rinse q12n4p  . chlorhexidine  15 mL Mouth Rinse BID  . heparin  5,000 Units Subcutaneous Q8H  . pantoprazole (PROTONIX) IV  40 mg Intravenous QHS    Assessment/Plan Distal SBO near anastomosis;  s/p subtotal colectomy  cholelitiaisis GERD   Plan:   Clamp NG for 4 hours check residual. (possible dc) if not will need to be pulled back secondary to placement. Continue home meds for chronic pain issues.    LOS: 2 days    Doneshia Hill 04/06/2012

## 2012-04-07 MED ORDER — PNEUMOCOCCAL VAC POLYVALENT 25 MCG/0.5ML IJ INJ
0.5000 mL | INJECTION | Freq: Once | INTRAMUSCULAR | Status: AC
  Administered 2012-04-07: 0.5 mL via INTRAMUSCULAR
  Filled 2012-04-07: qty 0.5

## 2012-04-07 MED ORDER — INFLUENZA VIRUS VACC SPLIT PF IM SUSP
0.5000 mL | Freq: Once | INTRAMUSCULAR | Status: AC
  Administered 2012-04-07: 0.5 mL via INTRAMUSCULAR
  Filled 2012-04-07: qty 0.5

## 2012-04-07 NOTE — Progress Notes (Signed)
Patient ID: Kent [name], male   DOB: 14-Nov-1940, 71 y.o.   MRN: [id number] Patient ID: Kent [name], male   DOB: 08/04/1940, 71 y.o.   MRN: [id number]    Subjective: Feels good this morning reports several BM's since yesterday "loose in nature". Denies any abdominal cramping or pain. No nausea. Patient would like to get flu and pneumonia vaccines.   Objective: Vital signs in last 24 hours: Temp:  [98.1 F (36.7 C)-98.6 F (37 C)] 98.1 F (36.7 C) (10/16 0919) Pulse Rate:  [72-97] 72  (10/16 0919) Resp:  [18-20] 18  (10/16 0919) BP: (129-155)/(61-72) 129/64 mmHg (10/16 0919) SpO2:  [98 %-99 %] 98 % (10/16 0919) Last BM Date: 04/07/12 NPO, afebrile, VSS, labs OK, 2 view film last PM 2200 hrs, shows ongoing SBO  Intake/Output from previous day: 04-30-23 0701 - 10/16 0700 In: 2488.4 [P.O.:240; I.V.:2248.4] Out: -  Intake/Output this shift: Total I/O In: 360 [P.O.:360] Out: -  General Appearance: A/A/O in no distress Chest: CTA Cardiac: RRR Abdomen:soft, non tender, + BS, BM; No bloating.   Lab Results:   [facility] April 29, 2012 0440 04/05/12 0635  WBC 5.9 5.6  HGB 12.2* 12.1*  HCT 35.4* 35.3*  PLT 173 157    BMET  Basename 2012/04/29 0440 04/05/12 0635  NA 146* 143  K 3.5 3.9  CL 109 108  CO2 25 28  GLUCOSE 66* 102*  BUN 11 8  CREATININE 0.92 0.91  CALCIUM 9.0 9.0   PT/INR  Basename 04/04/12 1403  LABPROT 13.0  INR 0.99     Lab 04/04/12 1020  AST 32  ALT 36  ALKPHOS 90  BILITOT 0.8  PROT 8.8*  ALBUMIN 4.8     Lipase     Component Value Date/Time   LIPASE 17 07/03/2009 1311     Studies/Results: Dg Abd 2 Views  Apr 29, 2012  *RADIOLOGY REPORT*  Clinical Data: Small bowel obstruction.  Back pain.  Abdominal discomfort.  ABDOMEN - 2 VIEW  Comparison: 04/04/2012.  Findings: Nasogastric tube is present with the proximal side port in the second part of the duodenum.  The tip of the nasogastric tube is approaching the ligament of Treitz in the third part of  the duodenum.  Persistent small bowel obstruction bowel gas pattern however the diameter small bowel loops has decreased compared to the prior exam 10/13, suggesting some resolution.  Paucity of colonic gas and stool. No free air.  IMPRESSION: Improving small bowel obstruction.  Nasogastric tube tip in the third part of the duodenum.   Original Report Authenticated By: Andreas Newport, M.D.     Medications:    . antiseptic oral rinse  15 mL Mouth Rinse q12n4p  . chlorhexidine  15 mL Mouth Rinse BID  . heparin  5,000 Units Subcutaneous Q8H  . pantoprazole (PROTONIX) IV  40 mg Intravenous QHS    Assessment/Plan Distal SBO near anastomosis;  s/p subtotal colectomy  cholelitiaisis GERD   Plan:   Continue home meds for chronic pain issues. Continue IVF for now secondary to loose stools Advance diet to full liquids Continue with GERD prophylaxis Probable discharge to home tomorrow if he continues to progress.    LOS: 3 days    Kent [name] 04/07/2012

## 2012-04-07 NOTE — Progress Notes (Signed)
Patient ID: Kent [name], male   DOB: 1941-03-06, 71 y.o.   MRN: [id number] See NP note.  Continues to have BMs now occasionally firm.  Advance to fulls. Patient examined and I agree with the assessment and plan as seen on Kent [name]'s note.  Violeta Gelinas, MD, MPH, FACS Pager: [phone]  04/07/2012 12:36 PM

## 2012-04-08 MED ORDER — HYDROCODONE-ACETAMINOPHEN 5-325 MG PO TABS
1.0000 | ORAL_TABLET | Freq: Three times a day (TID) | ORAL | Status: DC | PRN
  Administered 2012-04-08: 1 via ORAL
  Filled 2012-04-08: qty 1

## 2012-04-08 MED ORDER — PANTOPRAZOLE SODIUM 40 MG PO TBEC
40.0000 mg | DELAYED_RELEASE_TABLET | Freq: Every day | ORAL | Status: DC
  Administered 2012-04-08: 40 mg via ORAL
  Filled 2012-04-08: qty 1

## 2012-04-08 MED ORDER — OXYCODONE-ACETAMINOPHEN 5-325 MG PO TABS: 1.0000 | ORAL_TABLET | Freq: Three times a day (TID) | ORAL | Status: DC | PRN

## 2012-04-08 NOTE — Progress Notes (Signed)
Patient ID: Kent [name], male   DOB: 1941-01-10, 71 y.o.   MRN: [id number] Patient ID: Kent [name], male   DOB: July 03, 1940, 71 y.o.   MRN: [id number] Patient ID: Kent [name], male   DOB: 1940-08-14, 71 y.o.   MRN: [id number]    Subjective: Feels good this morning.  Denies any abdominal cramping or pain. No nausea. Tolerated diet well.    Objective: Vital signs in last 24 hours: Temp:  [98.1 F (36.7 C)-99 F (37.2 C)] 98.1 F (36.7 C) (10/17 0526) Pulse Rate:  [72-77] 77  (10/17 0526) Resp:  [18] 18  (10/17 0526) BP: (124-151)/(64-73) 151/71 mmHg (10/17 0526) SpO2:  [98 %-99 %] 98 % (10/17 0526) Last BM Date: 04/08/12 NPO, afebrile, VSS, labs OK, 2 view film last PM 2200 hrs, shows ongoing SBO  Intake/Output from previous day: 10/16 0701 - 10/17 0700 In: 1981.6 [P.O.:840; I.V.:1141.6] Out: 552 [Urine:551; Stool:1] Intake/Output this shift:   General Appearance: A/A/O in no distress Chest: CTA Cardiac: RRR Abdomen:soft, non tender, + BS, BM; No bloating.   Lab Results:   [facility] 04/06/12 0440  WBC 5.9  HGB 12.2*  HCT 35.4*  PLT 173    BMET  Basename 04/06/12 0440  NA 146*  K 3.5  CL 109  CO2 25  GLUCOSE 66*  BUN 11  CREATININE 0.92  CALCIUM 9.0   PT/INR No results found for this basename: LABPROT:2,INR:2 in the last 72 hours   Lab 04/04/12 1020  AST 32  ALT 36  ALKPHOS 90  BILITOT 0.8  PROT 8.8*  ALBUMIN 4.8     Lipase     Component Value Date/Time   LIPASE 17 07/03/2009 1311     Studies/Results: No results found.  Medications:    . antiseptic oral rinse  15 mL Mouth Rinse q12n4p  . chlorhexidine  15 mL Mouth Rinse BID  . heparin  5,000 Units Subcutaneous Q8H  . influenza  inactive virus vaccine  0.5 mL Intramuscular Once  . pantoprazole (PROTONIX) IV  40 mg Intravenous QHS  . pneumococcal 23 valent vaccine  0.5 mL Intramuscular Once    Assessment/Plan Distal SBO near anastomosis;  s/p subtotal colectomy   cholelitiaisis GERD   Plan:   Continue home meds for chronic pain issues. Advance diet DC IVF  Continue with GERD prophylaxis Probable discharge to home later today or tomorrow.    LOS: 4 days    Corrin Hingle 04/08/2012

## 2012-04-08 NOTE — Progress Notes (Signed)
Patient examined and I agree with the assessment and plan  Avion Patella, MD, MPH, FACS Pager: [phone]  04/08/2012 6:29 PM  

## 2012-04-09 NOTE — Discharge Summary (Signed)
Physician Discharge Summary  Patient ID: Kent [name] MRN: [id number] DOB/AGE: May 25, 1941 71 y.o.  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 Later underwent ileostomy takedown. In 2010, he was taken to OR for small bowel obstruction from a volvulus appears around anastomosis. He had done well since then. Yesterday he developed mid abdominal pain which has worsened to where this am he was unable to walk. He came to the [facility] and evaluation showed a distal SBO near the anastomosis.  He was admitted, placed on IVFs and bowel rest.  Over the next several days his bowel function did return and he was able to start advancing his diet.  His SBO resolved with conservative management.  On 10/18, he was tolerating diet, no pain, vitals stable and felt ready to go home.     Medication List     As of 04/09/2012  8:01 AM    TAKE these medications         esomeprazole 40 MG capsule   Commonly known as: NEXIUM   Take 40 mg by mouth daily before breakfast.      HYDROcodone-acetaminophen 5-500 MG per tablet   Commonly known as: VICODIN   Take 1 tablet by mouth every 8 (eight) hours as needed. For pain      methocarbamol 500 MG tablet   Commonly known as: ROBAXIN   Take 1,000 mg by mouth 2 (two) times daily as needed. For muscle spasms.      oxyCODONE 20 MG 12 hr tablet   Commonly known as: OXYCONTIN   Take 20 mg by mouth every 12 (twelve) hours.             Follow-up Information    Follow up with [facility] (As needed)    Contact information:   [address] [place] [address] [phone]      Follow up with Pcp Not In System. Schedule an appointment as soon as possible  for a visit in 2 weeks. (Make an appointment with your primary care doctor in 2-3 weeks for a hospital follow up)          Signed: Clance Boll, [facility] [phone]  04/09/2012, 8:01 AM

## 2012-04-09 NOTE — Discharge Instructions (Signed)
Intestinal Obstruction An intestinal obstruction comes from a physical blockage in the bowel. Different problems in the bowel may cause these blockages.  CAUSES   Adhesions from previous surgeries  Cancer or tumor  A hernia, which is a condition in which a portion of the bowel bulges out through an opening or weakness in the abdomen (belly). This sometimes squeezes the bowel.  A swallowed object.  Blockage (impaction) with worms is common in third world countries.  A twisting of the bowel or telescoping of a portion of the bowel into another portion (intussusceptions)  Anything that stops food from going through from the stomach to the anus. SYMPTOMS  Symptoms of bowel obstruction may result in your belly getting bigger (bloating), nausea, vomiting, explosive diarrhea, explosive stool. You may not be able to hear your normal bowel sounds (such as "growling in your stomach"). You may also stop having bowel movements or passing gas. DIAGNOSIS  Usually this condition is diagnosed with a history and an examination. Often, lab studies (blood work) and x-rays may be used to find the cause. TREATMENT  The main treatment of this condition is to rest the intestine (gut). Often times the obstruction may relieve itself and allow the gut to start working again. Think of the gut like a balloon that is blown up (filled with trapped food and water) which has squeezed into a hole or area which it cannot get through.   If the obstruction is complete, an NG tube (nasogastric) is passed through the nose and into the stomach. It is then connected to suction to keep the stomach emptied out. This also helps treat the nausea and vomiting.  If there is an imbalance in the electrolytes, they are corrected with intravenous fluids. These have the proper chemicals in them to correct the problem.  If the reason for the obstruction (blockage) does not get better with conservative (non-surgical) treatment, surgery may  be necessary. Sometimes, surgery is done immediately if your surgeon knows that the problem is not going to get better with conservative treatment. PROGNOSIS  Depending on what the problem is, most of these problems can be treated by your caregivers with good results. Your surgeon will discuss the best course of action to take, with you or your loved ones. FOLLOWING SURGERY Seek immediate medical attention if you have:  Increasing abdominal pain, repeated vomiting, dehydration or fainting.  Severe weakness, chest pain or back pain.  Blood in your vomit, stool or you have tarry stool. Document Released: 08/30/2003 Document Revised: 09/01/2011 Document Reviewed: 01/28/2008 [employer] Patient Information 2013 [employer].  

## 2012-04-09 NOTE — Discharge Summary (Signed)
Anwyn Kriegel, MD, MPH, FACS Pager: [phone]  

## 2013-01-19 ENCOUNTER — Encounter (HOSPITAL_COMMUNITY): Payer: Self-pay | Admitting: Emergency Medicine

## 2013-01-19 ENCOUNTER — Inpatient Hospital Stay (HOSPITAL_COMMUNITY)
Admission: EM | Admit: 2013-01-19 | Discharge: 2013-01-24 | DRG: 337 | Disposition: A | Discharge status: Home / Self Care | Payer: Medicare Other | Attending: General Surgery | Admitting: General Surgery

## 2013-01-19 ENCOUNTER — Emergency Department (HOSPITAL_COMMUNITY): Payer: Medicare Other

## 2013-01-19 DIAGNOSIS — K56609 Unspecified intestinal obstruction, unspecified as to partial versus complete obstruction: Secondary | ICD-10-CM

## 2013-01-19 DIAGNOSIS — K430 Incisional hernia with obstruction, without gangrene: Principal | ICD-10-CM | POA: Diagnosis present

## 2013-01-19 DIAGNOSIS — R030 Elevated blood-pressure reading, without diagnosis of hypertension: Secondary | ICD-10-CM | POA: Diagnosis not present

## 2013-01-19 DIAGNOSIS — K660 Peritoneal adhesions (postprocedural) (postinfection): Secondary | ICD-10-CM | POA: Diagnosis present

## 2013-01-19 DIAGNOSIS — G8929 Other chronic pain: Secondary | ICD-10-CM | POA: Diagnosis present

## 2013-01-19 DIAGNOSIS — Z79899 Other long term (current) drug therapy: Secondary | ICD-10-CM

## 2013-01-19 LAB — CBC WITH DIFFERENTIAL/PLATELET
Basophils Absolute: 0 10*3/uL (ref 0.0–0.1)
Basophils Relative: 0 % (ref 0–1)
Eosinophils Absolute: 0.1 10*3/uL (ref 0.0–0.7)
Eosinophils Relative: 0 % (ref 0–5)
HCT: 43.2 % (ref 39.0–52.0)
MCH: 29.7 pg (ref 26.0–34.0)
MCHC: 34.5 g/dL (ref 30.0–36.0)
MCV: 86.1 fL (ref 78.0–100.0)
Monocytes Absolute: 0.5 10*3/uL (ref 0.1–1.0)
Neutro Abs: 10.6 10*3/uL — ABNORMAL HIGH (ref 1.7–7.7)
RDW: 13.3 % (ref 11.5–15.5)

## 2013-01-19 LAB — COMPREHENSIVE METABOLIC PANEL
AST: 32 U/L (ref 0–37)
Albumin: 4.4 g/dL (ref 3.5–5.2)
Calcium: 10.2 mg/dL (ref 8.4–10.5)
Creatinine, Ser: 0.95 mg/dL (ref 0.50–1.35)
Total Protein: 8.2 g/dL (ref 6.0–8.3)

## 2013-01-19 LAB — PROTIME-INR
INR: 0.94 (ref 0.00–1.49)
Prothrombin Time: 12.4 seconds (ref 11.6–15.2)

## 2013-01-19 LAB — APTT: aPTT: 27 seconds (ref 24–37)

## 2013-01-19 MED ORDER — ONDANSETRON HCL 4 MG/2ML IJ SOLN
INTRAMUSCULAR | Status: AC
  Administered 2013-01-19: 4 mg
  Filled 2013-01-19: qty 2

## 2013-01-19 MED ORDER — ONDANSETRON HCL 4 MG/2ML IJ SOLN: 4.0000 mg | Freq: Once | INTRAMUSCULAR | Status: AC

## 2013-01-19 MED ORDER — IOHEXOL 300 MG/ML  SOLN
50.0000 mL | Freq: Once | INTRAMUSCULAR | Status: AC | PRN
  Administered 2013-01-19: 50 mL via ORAL

## 2013-01-19 MED ORDER — MORPHINE SULFATE 4 MG/ML IJ SOLN
4.0000 mg | Freq: Once | INTRAMUSCULAR | Status: AC
  Administered 2013-01-19: 4 mg via INTRAVENOUS
  Filled 2013-01-19: qty 1

## 2013-01-19 NOTE — ED Notes (Signed)
Patient has a tinge unit implanted in his lower left back area for his chronic back and knee pain.

## 2013-01-19 NOTE — ED Notes (Signed)
Pt c/o abd pain and distention with pain; pt with hx of colectomy and multiple SBOs; pt sts unable to drink water today also

## 2013-01-19 NOTE — ED Provider Notes (Signed)
CSN: [id number]     Arrival date & time 01/19/13  1804 History     First MD Initiated Contact with Patient 01/19/13 2158     Chief Complaint  Patient presents with  . Abdominal Pain   (Consider location/radiation/quality/duration/timing/severity/associated sxs/prior Treatment) HPI Pt with history of partial colectomy with multiple SBO's in the past present with 1 day of abdominal swelling, diffuse abdominal pain, nausea, and obstipation. No fever or chills.  History reviewed. No pertinent past medical history. Past Surgical History  Procedure Laterality Date  . Abdominal surgery    . Colon surgery    . Back surgery    . Incision and drainage foot    . Knee arthroscopy    . Shoulder arthroscopy     History reviewed. No pertinent family history. History  Substance Use Topics  . Smoking status: Never Smoker   . Smokeless tobacco: Not on file  . Alcohol Use: No    Review of Systems  Constitutional: Negative for fever and chills.  Respiratory: Negative for cough and shortness of breath.   Cardiovascular: Negative for chest pain.  Gastrointestinal: Positive for nausea, abdominal pain and abdominal distention. Negative for vomiting, diarrhea and blood in stool.  Musculoskeletal: Negative for myalgias and back pain.  Skin: Negative for rash and wound.  Neurological: Negative for dizziness, weakness, light-headedness, numbness and headaches.  All other systems reviewed and are negative.    Allergies  Review of patient's allergies indicates no known allergies.  Home Medications   Current Outpatient Rx  Name  Route  Sig  Dispense  Refill  . HYDROcodone-acetaminophen (NORCO/VICODIN) 5-325 MG per tablet   Oral   Take 1 tablet by mouth every 8 (eight) hours as needed for pain.         . methocarbamol (ROBAXIN) 500 MG tablet   Oral   Take 1,000 mg by mouth 2 (two) times daily.          Marland Kitchen omeprazole (PRILOSEC) 20 MG capsule   Oral   Take 40 mg by mouth daily.         Marland Kitchen oxyCODONE (OXYCONTIN) 20 MG 12 hr tablet   Oral   Take 20 mg by mouth every 12 (twelve) hours.          BP 178/88  Pulse 95  Temp(Src) 98.1 F (36.7 C) (Oral)  Resp 21  SpO2 98% Physical Exam  Nursing note and vitals reviewed. Constitutional: He is oriented to person, place, and time. He appears well-developed and well-nourished. No distress.  HENT:  Head: Normocephalic and atraumatic.  Mouth/Throat: Oropharynx is clear and moist.  Eyes: EOM are normal. Pupils are equal, round, and reactive to light.  Neck: Normal range of motion. Neck supple.  Cardiovascular: Normal rate and regular rhythm.   Pulmonary/Chest: Effort normal and breath sounds normal. No respiratory distress. He has no wheezes. He has no rales.  Abdominal: Soft. He exhibits distension. He exhibits no mass. There is tenderness (diffuse tenderness). There is no rebound and no guarding.  High pitched BS  Musculoskeletal: Normal range of motion. He exhibits no edema and no tenderness.  Neurological: He is alert and oriented to person, place, and time.  Skin: Skin is warm and dry. No rash noted. No erythema.  Psychiatric: He has a normal mood and affect. His behavior is normal.    ED Course   Procedures (including critical care time)  Labs Reviewed  CBC WITH DIFFERENTIAL - Abnormal; Notable for the following:    WBC  12.1 (*)    Neutrophils Relative % 88 (*)    Neutro Abs 10.6 (*)    Lymphocytes Relative 8 (*)    All other components within normal limits  COMPREHENSIVE METABOLIC PANEL - Abnormal; Notable for the following:    Glucose, Bld 137 (*)    GFR calc non Af Amer 82 (*)    All other components within normal limits  PROTIME-INR  APTT  TROPONIN I   Dg Abd Acute W/chest  01/19/2013   *RADIOLOGY REPORT*  Clinical Data: Abdominal pain and nausea.  History of prior colectomy.  ACUTE ABDOMEN SERIES (ABDOMEN 2 VIEW & CHEST 1 VIEW)  Comparison: 04/06/2012  Findings: The chest x-ray shows clear lungs.   Heart size is normal. Thoracic spinal stimulator device has been placed since the prior study.  Abdominal films show prominent small bowel loops centrally measuring up to approximately 4.5 cm in diameter.  These loops contain air fluid levels on the upright film.  No significant gas is identified in the colon and findings are consistent with small bowel obstruction.  There is no evidence of free intraperitoneal air.  No abnormal calcifications are seen.  Mild degenerative changes are present in the lower lumbar spine.  IMPRESSION: Findings consistent with small bowel obstruction.  No evidence of free air.   Original Report Authenticated By: Irish Lack, M.D.   No diagnosis found.  MDM  Discussed with Dr Carolynne Edouard. Recommended Ct abd/pelvis. Will see pt in ED.   Loren Racer, MD 01/19/13 [phone]

## 2013-01-19 NOTE — H&P (Signed)
Kent [name] is an 72 y.o. male.   Chief Complaint: abdominal pain HPI: The pt is a 72yo wm who states he ate some chicken yesterday that upset his stomach. He had several bm's yesterday but none today and no flatus today. He usually eats salad and drinks lots of water and has regular bm's. 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 His last couple episodes of sbo resolved without surgery. He also has a TENS unit in back for chronic pain  History reviewed. No pertinent past medical history.  Past Surgical History  Procedure Laterality Date  . Abdominal surgery    . Colon surgery    . Back surgery    . Incision and drainage foot    . Knee arthroscopy    . Shoulder arthroscopy      History reviewed. No pertinent family history. Social History:  reports that he has never smoked. He does not have any smokeless tobacco history on file. He reports that he does not drink alcohol or use illicit drugs.  Allergies: No Known Allergies   (Not in a hospital admission)  Results for orders placed during the hospital encounter of 01/19/13 (from the past 48 hour(s))  CBC WITH DIFFERENTIAL     Status: Abnormal   Collection Time    01/19/13  6:19 PM      Result Value Range   WBC 12.1 (*) 4.0 - 10.5 K/uL   RBC 5.02  4.22 - 5.81 MIL/uL   Hemoglobin 14.9  13.0 - 17.0 g/dL   HCT 16.1  09.6 - 04.5 %   MCV 86.1  78.0 - 100.0 fL   MCH 29.7  26.0 - 34.0 pg   MCHC 34.5  30.0 - 36.0 g/dL   RDW 40.9  81.1 - 91.4 %   Platelets 212  150 - 400 K/uL   Neutrophils Relative % 88 (*) 43 - 77 %   Neutro Abs 10.6 (*) 1.7 - 7.7 K/uL   Lymphocytes Relative 8 (*) 12 - 46 %   Lymphs Abs 0.9  0.7 - 4.0 K/uL   Monocytes Relative 4  3 - 12 %   Monocytes Absolute 0.5  0.1 - 1.0 K/uL   Eosinophils Relative 0  0 - 5 %   Eosinophils Absolute 0.1  0.0 - 0.7 K/uL   Basophils Relative 0  0 - 1 %   Basophils Absolute 0.0  0.0 - 0.1 K/uL  COMPREHENSIVE  METABOLIC PANEL     Status: Abnormal   Collection Time    01/19/13  6:19 PM      Result Value Range   Sodium 136  135 - 145 mEq/L   Potassium 4.2  3.5 - 5.1 mEq/L   Chloride 98  96 - 112 mEq/L   CO2 28  19 - 32 mEq/L   Glucose, Bld 137 (*) 70 - 99 mg/dL   BUN 11  6 - 23 mg/dL   Creatinine, Ser 7.82  0.50 - 1.35 mg/dL   Calcium 95.6  8.4 - 21.3 mg/dL   Total Protein 8.2  6.0 - 8.3 g/dL   Albumin 4.4  3.5 - 5.2 g/dL   AST 32  0 - 37 U/L   ALT 44  0 - 53 U/L   Alkaline Phosphatase 90  39 - 117 U/L   Total Bilirubin 0.9  0.3 - 1.2 mg/dL   GFR calc non Af Amer 82 (*) >  90 mL/min   GFR calc Af Amer >90  >90 mL/min   Comment:            The eGFR has been calculated     using the CKD EPI equation.     This calculation has not been     validated in all clinical     situations.     eGFR's persistently     <90 mL/min signify     possible Chronic Kidney Disease.  PROTIME-INR     Status: None   Collection Time    01/19/13 10:39 PM      Result Value Range   Prothrombin Time 12.4  11.6 - 15.2 seconds   INR 0.94  0.00 - 1.49  APTT     Status: None   Collection Time    01/19/13 10:39 PM      Result Value Range   aPTT 27  24 - 37 seconds   Dg Abd Acute W/chest  01/19/2013   *RADIOLOGY REPORT*  Clinical Data: Abdominal pain and nausea.  History of prior colectomy.  ACUTE ABDOMEN SERIES (ABDOMEN 2 VIEW & CHEST 1 VIEW)  Comparison: 04/06/2012  Findings: The chest x-ray shows clear lungs.  Heart size is normal. Thoracic spinal stimulator device has been placed since the prior study.  Abdominal films show prominent small bowel loops centrally measuring up to approximately 4.5 cm in diameter.  These loops contain air fluid levels on the upright film.  No significant gas is identified in the colon and findings are consistent with small bowel obstruction.  There is no evidence of free intraperitoneal air.  No abnormal calcifications are seen.  Mild degenerative changes are present in the lower lumbar  spine.  IMPRESSION: Findings consistent with small bowel obstruction.  No evidence of free air.   Original Report Authenticated By: Irish Lack, M.D.    Review of Systems  Constitutional: Negative.   HENT: Negative.   Eyes: Negative.   Respiratory: Negative.   Cardiovascular: Negative.   Gastrointestinal: Positive for nausea and abdominal pain. Negative for vomiting.  Genitourinary: Negative.   Musculoskeletal: Positive for back pain.  Skin: Negative.   Neurological: Negative.   Endo/Heme/Allergies: Negative.   Psychiatric/Behavioral: Negative.     Blood pressure 162/92, pulse 99, temperature 98.1 F (36.7 C), temperature source Oral, resp. rate 18, SpO2 99.00%. Physical Exam  Constitutional: He is oriented to person, place, and time. He appears well-developed and well-nourished.  HENT:  Head: Normocephalic and atraumatic.  Eyes: Conjunctivae and EOM are normal. Pupils are equal, round, and reactive to light.  Neck: Normal range of motion. Neck supple.  Cardiovascular: Normal rate, regular rhythm and normal heart sounds.   Respiratory: Effort normal and breath sounds normal.  GI: Bowel sounds are normal. He exhibits distension. There is tenderness. There is no rebound and no guarding.  Musculoskeletal: Normal range of motion.  Neurological: He is alert and oriented to person, place, and time.  Skin: Skin is warm and dry.  Psychiatric: He has a normal mood and affect. His behavior is normal.     Assessment/Plan The pt appears to have an sbo without evidence of peritonitis. I would recommend NG and bowel rest. We will monitor him closely. If he does not improve over the next 24 to 48 hours then he may need exploration.  TOTH III,Neftali Abair S 01/19/2013, 11:16 PM

## 2013-01-19 NOTE — ED Notes (Signed)
[facility] MD at bedside.

## 2013-01-19 NOTE — ED Notes (Signed)
MD at bedside. 

## 2013-01-20 ENCOUNTER — Inpatient Hospital Stay (HOSPITAL_COMMUNITY): Payer: Medicare Other

## 2013-01-20 ENCOUNTER — Inpatient Hospital Stay (HOSPITAL_COMMUNITY): Payer: Medicare Other | Admitting: Anesthesiology

## 2013-01-20 ENCOUNTER — Encounter (HOSPITAL_COMMUNITY): Payer: Self-pay | Admitting: Anesthesiology

## 2013-01-20 ENCOUNTER — Encounter (HOSPITAL_COMMUNITY): Payer: Self-pay | Admitting: Radiology

## 2013-01-20 ENCOUNTER — Encounter (HOSPITAL_COMMUNITY): Admission: EM | Disposition: A | Discharge status: Home / Self Care | Payer: Self-pay | Source: Home / Self Care

## 2013-01-20 HISTORY — PX: LYSIS OF ADHESION: SHX5961

## 2013-01-20 HISTORY — PX: INCISIONAL HERNIA REPAIR: SHX193

## 2013-01-20 HISTORY — PX: LAPAROTOMY: SHX154

## 2013-01-20 LAB — BASIC METABOLIC PANEL
BUN: 11 mg/dL (ref 6–23)
CO2: 27 mEq/L (ref 19–32)
Calcium: 9.3 mg/dL (ref 8.4–10.5)
GFR calc non Af Amer: 88 mL/min — ABNORMAL LOW (ref >90)
Glucose, Bld: 171 mg/dL — ABNORMAL HIGH (ref 70–99)
Sodium: 135 mEq/L (ref 135–145)

## 2013-01-20 LAB — CREATININE, SERUM: GFR calc non Af Amer: 85 mL/min — ABNORMAL LOW (ref >90)

## 2013-01-20 LAB — CBC
HCT: 37.4 % — ABNORMAL LOW (ref 39.0–52.0)
MCH: 30.7 pg (ref 26.0–34.0)
MCHC: 35.9 g/dL (ref 30.0–36.0)
MCHC: 34 g/dL (ref 30.0–36.0)
MCV: 85.6 fL (ref 78.0–100.0)
Platelets: 187 10*3/uL (ref 150–400)
Platelets: 160 10*3/uL (ref 150–400)
RBC: 4.59 MIL/uL (ref 4.22–5.81)
RDW: 13.4 % (ref 11.5–15.5)

## 2013-01-20 SURGERY — LAPAROTOMY, EXPLORATORY
Anesthesia: General | Site: Abdomen | Wound class: Clean

## 2013-01-20 MED ORDER — ONDANSETRON HCL 4 MG/2ML IJ SOLN
INTRAMUSCULAR | Status: DC | PRN
  Administered 2013-01-20: 4 mg via INTRAVENOUS

## 2013-01-20 MED ORDER — DEXTROSE 5 % IV SOLN
INTRAVENOUS | Status: AC
  Filled 2013-01-20: qty 1

## 2013-01-20 MED ORDER — KETOROLAC TROMETHAMINE 30 MG/ML IJ SOLN
INTRAMUSCULAR | Status: AC
  Filled 2013-01-20: qty 1

## 2013-01-20 MED ORDER — DEXTROSE 5 % IV SOLN
2.0000 g | Freq: Once | INTRAVENOUS | Status: AC
  Administered 2013-01-20: 2 g via INTRAVENOUS
  Filled 2013-01-20: qty 2

## 2013-01-20 MED ORDER — HEPARIN SODIUM (PORCINE) 5000 UNIT/ML IJ SOLN
5000.0000 [IU] | Freq: Three times a day (TID) | INTRAMUSCULAR | Status: DC
  Administered 2013-01-21 – 2013-01-24 (×9): 5000 [IU] via SUBCUTANEOUS
  Filled 2013-01-20 (×13): qty 1

## 2013-01-20 MED ORDER — IOHEXOL 300 MG/ML  SOLN
100.0000 mL | Freq: Once | INTRAMUSCULAR | Status: AC | PRN
  Administered 2013-01-20: 100 mL via INTRAVENOUS

## 2013-01-20 MED ORDER — KCL IN DEXTROSE-NACL 20-5-0.9 MEQ/L-%-% IV SOLN
INTRAVENOUS | Status: DC
  Administered 2013-01-20 – 2013-01-22 (×7): via INTRAVENOUS
  Filled 2013-01-20 (×11): qty 1000

## 2013-01-20 MED ORDER — HYDROMORPHONE HCL PF 1 MG/ML IJ SOLN
INTRAMUSCULAR | Status: AC
  Filled 2013-01-20: qty 1

## 2013-01-20 MED ORDER — PHENYLEPHRINE HCL 10 MG/ML IJ SOLN
INTRAMUSCULAR | Status: DC | PRN
  Administered 2013-01-20: 80 ug via INTRAVENOUS
  Administered 2013-01-20: 120 ug via INTRAVENOUS

## 2013-01-20 MED ORDER — ONDANSETRON HCL 4 MG/2ML IJ SOLN: 4.0000 mg | Freq: Once | INTRAMUSCULAR | Status: AC | PRN

## 2013-01-20 MED ORDER — MORPHINE SULFATE 2 MG/ML IJ SOLN
1.0000 mg | INTRAMUSCULAR | Status: DC | PRN
  Administered 2013-01-20 – 2013-01-21 (×3): 2 mg via INTRAVENOUS
  Administered 2013-01-21: 4 mg via INTRAVENOUS
  Administered 2013-01-21: 2 mg via INTRAVENOUS
  Administered 2013-01-21: 4 mg via INTRAVENOUS
  Administered 2013-01-21 – 2013-01-22 (×2): 2 mg via INTRAVENOUS
  Filled 2013-01-20 (×3): qty 1
  Filled 2013-01-20: qty 2
  Filled 2013-01-20: qty 1
  Filled 2013-01-20: qty 2
  Filled 2013-01-20 (×2): qty 1

## 2013-01-20 MED ORDER — LACTATED RINGERS IV SOLN: INTRAVENOUS | Status: DC | PRN

## 2013-01-20 MED ORDER — FENTANYL CITRATE 0.05 MG/ML IJ SOLN
INTRAMUSCULAR | Status: DC | PRN
  Administered 2013-01-20: 100 ug via INTRAVENOUS
  Administered 2013-01-20 (×3): 50 ug via INTRAVENOUS

## 2013-01-20 MED ORDER — NEOSTIGMINE METHYLSULFATE 1 MG/ML IJ SOLN
INTRAMUSCULAR | Status: DC | PRN
  Administered 2013-01-20: 4 mg via INTRAVENOUS

## 2013-01-20 MED ORDER — PROPOFOL 10 MG/ML IV BOLUS
INTRAVENOUS | Status: DC | PRN
  Administered 2013-01-20: 150 mg via INTRAVENOUS

## 2013-01-20 MED ORDER — EPHEDRINE SULFATE 50 MG/ML IJ SOLN
INTRAMUSCULAR | Status: DC | PRN
  Administered 2013-01-20: 10 mg via INTRAVENOUS

## 2013-01-20 MED ORDER — KETOROLAC TROMETHAMINE 30 MG/ML IJ SOLN
15.0000 mg | Freq: Once | INTRAMUSCULAR | Status: AC | PRN
  Administered 2013-01-20: 30 mg via INTRAVENOUS

## 2013-01-20 MED ORDER — SUCCINYLCHOLINE CHLORIDE 20 MG/ML IJ SOLN
INTRAMUSCULAR | Status: DC | PRN
  Administered 2013-01-20: 100 mg via INTRAVENOUS

## 2013-01-20 MED ORDER — ONDANSETRON HCL 4 MG/2ML IJ SOLN
4.0000 mg | Freq: Four times a day (QID) | INTRAMUSCULAR | Status: DC | PRN
  Administered 2013-01-21: 4 mg via INTRAVENOUS
  Filled 2013-01-20: qty 2

## 2013-01-20 MED ORDER — LIDOCAINE HCL (CARDIAC) 20 MG/ML IV SOLN
INTRAVENOUS | Status: DC | PRN
  Administered 2013-01-20: 35 mg via INTRAVENOUS

## 2013-01-20 MED ORDER — MORPHINE SULFATE 4 MG/ML IJ SOLN
4.0000 mg | INTRAMUSCULAR | Status: DC | PRN
  Administered 2013-01-20 (×2): 4 mg via INTRAVENOUS
  Filled 2013-01-20 (×2): qty 1

## 2013-01-20 MED ORDER — ROCURONIUM BROMIDE 100 MG/10ML IV SOLN
INTRAVENOUS | Status: DC | PRN
  Administered 2013-01-20: 10 mg via INTRAVENOUS
  Administered 2013-01-20: 30 mg via INTRAVENOUS
  Administered 2013-01-20: 10 mg via INTRAVENOUS

## 2013-01-20 MED ORDER — HYDROMORPHONE HCL PF 1 MG/ML IJ SOLN
0.2500 mg | INTRAMUSCULAR | Status: DC | PRN
  Administered 2013-01-20 – 2013-01-24 (×6): 0.5 mg via INTRAVENOUS
  Filled 2013-01-20 (×2): qty 1

## 2013-01-20 MED ORDER — 0.9 % SODIUM CHLORIDE (POUR BTL) OPTIME
TOPICAL | Status: DC | PRN
  Administered 2013-01-20 (×2): 1000 mL

## 2013-01-20 MED ORDER — GLYCOPYRROLATE 0.2 MG/ML IJ SOLN
INTRAMUSCULAR | Status: DC | PRN
  Administered 2013-01-20: .8 mg via INTRAVENOUS

## 2013-01-20 MED ORDER — LACTATED RINGERS IV SOLN
INTRAVENOUS | Status: DC | PRN
  Administered 2013-01-20 (×2): via INTRAVENOUS

## 2013-01-20 MED ORDER — PANTOPRAZOLE SODIUM 40 MG IV SOLR
40.0000 mg | Freq: Every day | INTRAVENOUS | Status: DC
  Administered 2013-01-20 – 2013-01-22 (×4): 40 mg via INTRAVENOUS
  Filled 2013-01-20 (×6): qty 40

## 2013-01-20 SURGICAL SUPPLY — 45 items
BLADE SURG ROTATE 9660 (MISCELLANEOUS) IMPLANT
CANISTER SUCTION 2500CC (MISCELLANEOUS) ×2 IMPLANT
CHLORAPREP W/TINT 26ML (MISCELLANEOUS) ×2 IMPLANT
CLOTH BEACON ORANGE TIMEOUT ST (SAFETY) ×2 IMPLANT
COVER SURGICAL LIGHT HANDLE (MISCELLANEOUS) ×2 IMPLANT
DRAPE LAPAROSCOPIC ABDOMINAL (DRAPES) ×2 IMPLANT
DRAPE UTILITY 15X26 W/TAPE STR (DRAPE) ×4 IMPLANT
DRAPE WARM FLUID 44X44 (DRAPE) ×2 IMPLANT
ELECT BLADE 6.5 EXT (BLADE) ×2 IMPLANT
ELECT CAUTERY BLADE 6.4 (BLADE) ×2 IMPLANT
ELECT REM PT RETURN 9FT ADLT (ELECTROSURGICAL) ×2
ELECTRODE REM PT RTRN 9FT ADLT (ELECTROSURGICAL) ×1 IMPLANT
GLOVE BIO SURGEON STRL SZ8 (GLOVE) ×2 IMPLANT
GLOVE BIOGEL PI IND STRL 6 (GLOVE) ×1 IMPLANT
GLOVE BIOGEL PI IND STRL 7.0 (GLOVE) ×1 IMPLANT
GLOVE BIOGEL PI IND STRL 8 (GLOVE) ×2 IMPLANT
GLOVE BIOGEL PI INDICATOR 6 (GLOVE) ×1
GLOVE BIOGEL PI INDICATOR 7.0 (GLOVE) ×1
GLOVE BIOGEL PI INDICATOR 8 (GLOVE) ×2
GLOVE ECLIPSE 7.0 STRL STRAW (GLOVE) ×2 IMPLANT
GLOVE SURG SS PI 7.0 STRL IVOR (GLOVE) ×2 IMPLANT
GOWN STRL NON-REIN LRG LVL3 (GOWN DISPOSABLE) ×4 IMPLANT
GOWN STRL REIN XL XLG (GOWN DISPOSABLE) ×2 IMPLANT
KIT BASIN OR (CUSTOM PROCEDURE TRAY) ×2 IMPLANT
KIT ROOM TURNOVER OR (KITS) ×2 IMPLANT
LIGASURE IMPACT 36 18CM CVD LR (INSTRUMENTS) IMPLANT
NS IRRIG 1000ML POUR BTL (IV SOLUTION) ×4 IMPLANT
PACK GENERAL/GYN (CUSTOM PROCEDURE TRAY) ×2 IMPLANT
PAD ARMBOARD 7.5X6 YLW CONV (MISCELLANEOUS) ×2 IMPLANT
SEPRAFILM PROCEDURAL PACK 3X5 (MISCELLANEOUS) ×2 IMPLANT
SPECIMEN JAR LARGE (MISCELLANEOUS) IMPLANT
SPONGE GAUZE 4X4 12PLY (GAUZE/BANDAGES/DRESSINGS) ×2 IMPLANT
SPONGE LAP 18X18 X RAY DECT (DISPOSABLE) IMPLANT
STAPLER VISISTAT 35W (STAPLE) ×2 IMPLANT
SUCTION POOLE TIP (SUCTIONS) ×2 IMPLANT
SUT PDS AB 0 CT 36 (SUTURE) ×2 IMPLANT
SUT PDS AB 1 TP1 96 (SUTURE) ×4 IMPLANT
SUT SILK 2 0 SH CR/8 (SUTURE) ×2 IMPLANT
SUT SILK 2 0 TIES 10X30 (SUTURE) ×2 IMPLANT
SUT SILK 3 0 SH CR/8 (SUTURE) ×2 IMPLANT
SUT SILK 3 0 TIES 10X30 (SUTURE) ×2 IMPLANT
TOWEL OR 17X26 10 PK STRL BLUE (TOWEL DISPOSABLE) ×2 IMPLANT
TRAY FOLEY IC TEMP SENS 14FR (CATHETERS) ×2 IMPLANT
WATER STERILE IRR 1000ML POUR (IV SOLUTION) IMPLANT
YANKAUER SUCT BULB TIP NO VENT (SUCTIONS) ×2 IMPLANT

## 2013-01-20 NOTE — Transfer of Care (Signed)
Immediate Anesthesia Transfer of Care Note  Patient: Kent [name]  Procedure(s) Performed: Procedure(s): EXPLORATORY LAPAROTOMY (N/A) LYSIS OF ADHESION (N/A) HERNIA REPAIR INCISIONAL (N/A)  Patient Location: PACU  Anesthesia Type:General  Level of Consciousness: awake, alert  and oriented  Airway & Oxygen Therapy: Patient Spontanous Breathing and Patient connected to face mask oxygen  Post-op Assessment: Report given to PACU RN, Post -op Vital signs reviewed and stable and Patient moving all extremities X 4  Post vital signs: Reviewed and stable  Complications: No apparent anesthesia complications

## 2013-01-20 NOTE — Anesthesia Procedure Notes (Signed)
Procedure Name: Intubation Date/Time: 01/20/2013 9:47 AM Performed by: Quentin Ore Pre-anesthesia Checklist: Patient identified, Emergency Drugs available, Suction available, Patient being monitored and Timeout performed Patient Re-evaluated:Patient Re-evaluated prior to inductionOxygen Delivery Method: Circle system utilized Preoxygenation: Pre-oxygenation with 100% oxygen Intubation Type: IV induction and Rapid sequence Laryngoscope Size: Mac and 3 Grade View: Grade II Tube type: Oral Tube size: 7.5 mm Number of attempts: 1 Airway Equipment and Method: Stylet Placement Confirmation: ETT inserted through vocal cords under direct vision,  positive ETCO2 and breath sounds checked- equal and bilateral Secured at: 22 cm Tube secured with: Tape Dental Injury: Teeth and Oropharynx as per pre-operative assessment

## 2013-01-20 NOTE — Op Note (Signed)
01/19/2013 - 01/20/2013  10:56 AM  PATIENT:  Kent [name]  72 y.o. male  PRE-OPERATIVE DIAGNOSIS:  SMALL BOWEL OBSTRUCTION, INCARCERATED INCISIONAL HERNIA  POST-OPERATIVE DIAGNOSIS:  SMALL BOWEL OBSTRUCTION, INCARCERATED INCISIONAL HERNIA  PROCEDURE:  Procedure(s): EXPLORATORY LAPAROTOMY LYSIS OF ADHESION 30 MIN HERNIA REPAIR INCARCERATED INCISIONAL  SURGEON:  Surgeon(s): Liz Malady, MD  PHYSICIAN ASSISTANT: Barnetta Chapel, [facility]  ANESTHESIA:   general  EBL:  Total I/O In: 1000 [I.V.:1000] Out: 125 [Urine:100; Blood:25]  BLOOD ADMINISTERED:none  DRAINS: none   SPECIMEN:  No Specimen  DISPOSITION OF SPECIMEN:  N/A  COUNTS:  YES  DICTATION: .Dragon Dictation  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 He was admitted with bowel obstruction. He was found on CT to have incarcerated hernia through his old ostomy site. He is brought for exploration. Informed consent was obtained. He received intravenous antibiotics. He is brought to the operating room. General endotracheal anesthesia was a Optician, dispensing by the anesthesia staff. His abdomen was prepped and draped in sterile fashion. Time out procedure was done. Limited midline incision was made along his previous scar. Subcutaneous tissues were dissected down revealing the anterior fascia. This was entered carefully under direct vision. The fascia was opened for a small length. There were some small bowel adhesions up to the anterior abdominal wall. These were taken down with sharp dissection. Small serosal defects were then repaired with 2-0 silk Lembert sutures. There were no enterotomies. Once those listed bowel were freed up the hernia was reduced easily. The bowel appeared viable. The small bowel was then run back from the ligament of Treitz. As we moved further distally, more adhesions were found that we lysed freeing up the small bowel. This included a dense adhesion down near his  anastomosis. Total lysis of adhesions is 30 minutes. Once the bowel was all freed up it was returned anatomically to the abdomen. The hernia defect was small. Muscle edges were relatively close together. It was repaired with interrupted 0 PDS with excellent closure. Nasogastric tube was confirmed in good position. The bowel was checked one more time. There were no enterotomies. Abdomen was then Irrigated and closed with 2 lengths of running #1 PDS from each end tied in the middle. Subcutaneous tissues were irrigated.Skin was closed with staples. All counts were correct.Patient tolerated procedure well without apparent complication and was taken recovery in stable condition.  PATIENT DISPOSITION:  PACU - hemodynamically stable.   Delay start of Pharmacological VTE agent (>24hrs) due to surgical blood loss or risk of bleeding:  no  Violeta Gelinas, MD, MPH, FACS Pager: [phone]  7/31/201410:56 AM

## 2013-01-20 NOTE — Anesthesia Preprocedure Evaluation (Addendum)
Anesthesia Evaluation  Patient identified by MRN, date of birth, ID band Patient awake    Reviewed: Allergy & Precautions, H&P , NPO status , Patient's Chart, lab work & pertinent test results  Airway Mallampati: I TM Distance: >3 FB     Dental  (+) Teeth Intact   Pulmonary  breath sounds clear to auscultation        Cardiovascular Rhythm:Regular Rate:Normal     Neuro/Psych    GI/Hepatic GERD-  Medicated,  Endo/Other    Renal/GU      Musculoskeletal   Abdominal   Peds  Hematology   Anesthesia Other Findings   Reproductive/Obstetrics                         Anesthesia Physical Anesthesia Plan  ASA: III  Anesthesia Plan: General   Post-op Pain Management:    Induction: Intravenous  Airway Management Planned: Oral ETT  Additional Equipment:   Intra-op Plan:   Post-operative Plan: Extubation in OR  Informed Consent: I have reviewed the patients History and Physical, chart, labs and discussed the procedure including the risks, benefits and alternatives for the proposed anesthesia with the patient or authorized representative who has indicated his/her understanding and acceptance.   Dental advisory given  Plan Discussed with: CRNA and Anesthesiologist  Anesthesia Plan Comments:        Anesthesia Quick Evaluation

## 2013-01-20 NOTE — Progress Notes (Signed)
UR COMPLETED  

## 2013-01-20 NOTE — Anesthesia Postprocedure Evaluation (Signed)
  Anesthesia Post-op Note  Patient: Kent [name]  Procedure(s) Performed: Procedure(s): EXPLORATORY LAPAROTOMY (N/A) LYSIS OF ADHESION (N/A) HERNIA REPAIR INCISIONAL (N/A)  Patient Location: PACU  Anesthesia Type:General  Level of Consciousness: awake, alert  and oriented  Airway and Oxygen Therapy: Patient Spontanous Breathing and Patient connected to nasal cannula oxygen  Post-op Pain: mild  Post-op Assessment: Post-op Vital signs reviewed, Patient's Cardiovascular Status Stable, Respiratory Function Stable, Patent Airway, No signs of Nausea or vomiting and Pain level controlled  Post-op Vital Signs: stable  Complications: No apparent anesthesia complications

## 2013-01-20 NOTE — Preoperative (Signed)
Beta Blockers   Reason not to administer Beta Blockers:Not Applicable 

## 2013-01-20 NOTE — Progress Notes (Signed)
Report given to kathy rn as caregiver 

## 2013-01-20 NOTE — Progress Notes (Signed)
Dr. Noreene Larsson came to bedside

## 2013-01-20 NOTE — Progress Notes (Signed)
Report received from Nonah Mattes to take over her care for patient

## 2013-01-20 NOTE — ED Notes (Signed)
Pt back from CT. Eddie Candle RN notified pt is leaving the ED

## 2013-01-20 NOTE — Progress Notes (Signed)
Dr. Noreene Larsson called for sign out

## 2013-01-20 NOTE — Progress Notes (Signed)
Subjective: Less pain, no flatus  Objective: Vital signs in last 24 hours: Temp:  [98.1 F (36.7 C)-99.7 F (37.6 C)] 99.7 F (37.6 C) (07/31 1191) Pulse Rate:  [93-113] 93 (07/31 0633) Resp:  [18-25] 18 (07/31 0633) BP: (132-184)/(69-96) 154/69 mmHg (07/31 0633) SpO2:  [95 %-100 %] 95 % (07/31 4782) Weight:  [76.3 kg (168 lb 3.4 oz)] 76.3 kg (168 lb 3.4 oz) (07/31 0100) Last BM Date: 01/18/13  Intake/Output from previous day: 07/30 0701 - 07/31 0700 In: 571 [I.V.:571] Out: 600  Intake/Output this shift:    General appearance: cooperative Resp: clear to auscultation bilaterally Cardio: regular rate and rhythm GI: moderate distention, tender cephalad to RLQ stoma scar with palpable mass there which does not reduce Neurologic: Mental status: Alert, oriented, thought content appropriate  Lab Results:   Recent Labs  01/19/13 1819 01/20/13 0530  WBC 12.1* 12.4*  HGB 14.9 14.1  HCT 43.2 39.3  PLT 212 187   BMET  Recent Labs  01/19/13 1819 01/20/13 0530  NA 136 135  K 4.2 3.8  CL 98 100  CO2 28 27  GLUCOSE 137* 171*  BUN 11 11  CREATININE 0.95 0.80  CALCIUM 10.2 9.3   PT/INR  Recent Labs  01/19/13 2239  LABPROT 12.4  INR 0.94   ABG No results found for this basename: PHART, PCO2, PO2, HCO3,  in the last 72 hours  Studies/Results: Ct Abdomen Pelvis W Contrast  01/20/2013   *RADIOLOGY REPORT*  Clinical Data: Abdominal pain.  History of sub total colectomy. Lysis of adhesions for small bowel obstruction.  CT ABDOMEN AND PELVIS WITH CONTRAST  Technique:  Multidetector CT imaging of the abdomen and pelvis was performed following the standard protocol during bolus administration of intravenous contrast.  Contrast: OMNIPAQUE IOHEXOL 300 MG/ML  SOLN  Comparison: Radiographs 01/19/2013.  Findings: Lung Bases: Dependent atelectasis at the right lung base. Suggestion of emphysema. Coronary artery atherosclerosis is present. If office based assessment of  coronary risk factors has not been performed, it is now recommended.  Liver:  Grossly normal.  Perihepatic ascites is present.  Spleen:  Normal.  Gallbladder:  Intermediate attenuation of the gallbladder compatible with biliary sludge.  Common bile duct:  Within normal limits.  No obstruction.  Pancreas:  Atrophic, as expected for age.  Adrenal glands:  Normal.  Kidneys:  Normal enhancement and delayed excretion of contrast.  No obstruction.  Stomach:  Distended stomach with small hiatal hernia.  Small bowel:  Complete small bowel obstruction is present.  There is a herniated loop of bowel that is the transition point in the right lower quadrant.   This is probably incarcerated.  There is no pneumatosis.  No perforation or free air.  Small volume of ascites is reactive.  Colon:   Almost completely decompressed.  Ascending colectomy.  Pelvic Genitourinary:  Distended urinary bladder.  Bones:  No aggressive osseous lesions.  Vasculature: Atherosclerosis without acute vascular abnormality. No aneurysm.  Body Wall: Midline scarring.  Fat containing left inguinal hernia.  IMPRESSION: 1.  Complete small bowel obstruction with transition point in the right lower quadrant.  Although this resembles a spigelian hernia, the hernia actually occurs through the rectus abdominus proper. This may have been the site of prior ostomy.  Incarceration is suspected.  Recommend clinical assessment for reducibility. 2.  Right colectomy. 3.  Reactive ascites.   Original Report Authenticated By: Andreas Newport, M.D.   Dg Abd Acute W/chest  01/19/2013   *RADIOLOGY REPORT*  Clinical Data: Abdominal pain and nausea.  History of prior colectomy.  ACUTE ABDOMEN SERIES (ABDOMEN 2 VIEW & CHEST 1 VIEW)  Comparison: 04/06/2012  Findings: The chest x-ray shows clear lungs.  Heart size is normal. Thoracic spinal stimulator device has been placed since the prior study.  Abdominal films show prominent small bowel loops centrally measuring up to  approximately 4.5 cm in diameter.  These loops contain air fluid levels on the upright film.  No significant gas is identified in the colon and findings are consistent with small bowel obstruction.  There is no evidence of free intraperitoneal air.  No abnormal calcifications are seen.  Mild degenerative changes are present in the lower lumbar spine.  IMPRESSION: Findings consistent with small bowel obstruction.  No evidence of free air.   Original Report Authenticated By: Irish Lack, M.D.   Dg Abd Portable 2v  01/20/2013   *RADIOLOGY REPORT*  Clinical Data: Small bowel obstruction  PORTABLE ABDOMEN - 2 VIEW  Comparison: 0029 hours  Findings: No free intraperitoneal gas on the left decubitus image. Air-fluid levels are noted.  Distended bowel loops in the right hemiabdomen. Overall small bowel distention has improved.  NG tube is in place with its tip in the fundus of the stomach.  Contrast fills the bladder.  Colonic gas is noted.  IMPRESSION: No free intraperitoneal gas.  Distended small bowel loops persist in the right side of the abdomen. Overall improvement in small bowel distention.   Original Report Authenticated By: Jolaine Click, M.D.    Anti-infectives: Anti-infectives   Start     Dose/Rate Route Frequency Ordered Stop   01/20/13 0930  cefOXitin (MEFOXIN) 2 g in dextrose 5 % 50 mL IVPB     2 g 100 mL/hr over 30 Minutes Intravenous  Once 01/20/13 0743        Assessment/Plan: s/p Procedure(s): EXPLORATORY LAPAROTOMY (N/A) Incarcerated incisional hernia at old stoma site with assocoated SBO - to OR for ex lap and repair, possible bowel resection. Procedure, Risks, benefits D/W him and his wife. He agrees.  LOS: 1 day    Shoaib Siefker E 01/20/2013

## 2013-01-21 ENCOUNTER — Encounter (HOSPITAL_COMMUNITY): Payer: Self-pay | Admitting: General Surgery

## 2013-01-21 LAB — BASIC METABOLIC PANEL
BUN: 8 mg/dL (ref 6–23)
Calcium: 8.6 mg/dL (ref 8.4–10.5)
GFR calc Af Amer: >90 mL/min (ref >90)
GFR calc non Af Amer: 85 mL/min — ABNORMAL LOW (ref >90)
Glucose, Bld: 153 mg/dL — ABNORMAL HIGH (ref 70–99)
Sodium: 138 mEq/L (ref 135–145)

## 2013-01-21 LAB — CBC
Hemoglobin: 12.7 g/dL — ABNORMAL LOW (ref 13.0–17.0)
MCH: 30.3 pg (ref 26.0–34.0)
MCHC: 35 g/dL (ref 30.0–36.0)
RDW: 13.2 % (ref 11.5–15.5)

## 2013-01-21 MED ORDER — PHENOL 1.4 % MT LIQD
1.0000 | OROMUCOSAL | Status: DC | PRN
  Administered 2013-01-21: 1 via OROMUCOSAL
  Filled 2013-01-21 (×2): qty 177

## 2013-01-21 MED ORDER — KETOROLAC TROMETHAMINE 15 MG/ML IJ SOLN
15.0000 mg | Freq: Four times a day (QID) | INTRAMUSCULAR | Status: DC | PRN
  Administered 2013-01-21: 30 mg via INTRAVENOUS
  Administered 2013-01-22 – 2013-01-23 (×3): 15 mg via INTRAVENOUS
  Filled 2013-01-21: qty 1
  Filled 2013-01-21: qty 2
  Filled 2013-01-21 (×2): qty 1

## 2013-01-21 MED ORDER — KETOROLAC TROMETHAMINE 30 MG/ML IJ SOLN
30.0000 mg | Freq: Once | INTRAMUSCULAR | Status: AC
  Administered 2013-01-21: 30 mg via INTRAVENOUS
  Filled 2013-01-21: qty 1

## 2013-01-21 NOTE — Progress Notes (Signed)
Sore, was able to urinate after foley removal. Mobilize and await return of bowel function. I spoke to his wife at the bedside. Patient examined and I agree with the assessment and plan  Violeta Gelinas, MD, MPH, FACS Pager: [phone]  01/21/2013 10:39 AM

## 2013-01-21 NOTE — Evaluation (Signed)
Physical Therapy Evaluation  Patient Details Name: Kent [name] MRN: [id number] DOB: [birth year] Today's Date: 01/21/2013 Time: 8657-8469 PT Time Calculation (min): 21 min  PT Assessment / Plan / Recommendation History of Present Illness   Pt s/p surgery for SMALL BOWEL OBSTRUCTION, INCARCERATED INCISIONAL HERNIA.     Clinical Impression  Pt with minor limitations with mobility, which he reports is his usual progression after surgery.  Would recommend pt ambulate several more times with nursing then once they feel safe let pt begin to ambulate with wife.  Wife reports she has helped pt post surgery before and feels comfortable ambulating with him.  Encouraged pt to ambulate about 3 times daily with assist.  Pt/wife voiced understanding and with no further questions at this time.  NO FURTHER ACUTE PT NEEDS.      PT Assessment  All further PT needs can be met in the next venue of care (return to oupt PT when advised by MD)    Follow Up Recommendations  Outpatient PT (when advised by MD)    Does the patient have the potential to tolerate intense rehabilitation      Barriers to Discharge        Equipment Recommendations  None recommended by PT    Recommendations for Other Services     Frequency      Precautions / Restrictions Precautions Precautions: None   Pertinent Vitals/Pain 6/10 at incision and in back and right knee.  Had pain meds not long before session per pt.        Mobility  Bed Mobility Bed Mobility: Rolling Right;Right Sidelying to Sit Rolling Right: 6: Modified independent (Device/Increase time);With rail Right Sidelying to Sit: 4: Min assist;With rails Details for Bed Mobility Assistance: assist needed due to abdominal pain Transfers Transfers: Sit to Stand;Stand to Sit Sit to Stand: 4: Min guard;From bed;With upper extremity assist Stand to Sit: 5: Supervision;To bed Details for Transfer Assistance: transfers slowly due to  pain Ambulation/Gait Ambulation/Gait Assistance: 5: Supervision Ambulation Distance (Feet): 250 Feet Assistive device: Other (Comment) (held to IV pole) Ambulation/Gait Assistance Details: no balance losses Gait Pattern: Step-through pattern Gait velocity: WFL Stairs: No    Exercises     PT Diagnosis:    PT Problem List: Pain PT Treatment Interventions:       PT Goals(Current goals can be found in the care plan section)    Visit Information  Last PT Received On: 01/21/13 Assistance Needed: +1       Prior Functioning  Home Living Family/patient expects to be discharged to:: Private residence Living Arrangements: Spouse/significant other Available Help at Discharge: Family;Available PRN/intermittently Type of Home: House Home Layout: Two level;Bed/bath upstairs Alternate Level Stairs-Number of Steps: 14 Home Equipment: Cane - single point;Walker - 2 wheels Prior Function Level of Independence: Independent Communication Communication: No difficulties    Cognition  Cognition Arousal/Alertness: Awake/alert Behavior During Therapy: WFL for tasks assessed/performed Overall Cognitive Status: Within Functional Limits for tasks assessed    Extremity/Trunk Assessment Upper Extremity Assessment Upper Extremity Assessment: Overall WFL for tasks assessed Lower Extremity Assessment Lower Extremity Assessment: Overall WFL for tasks assessed Cervical / Trunk Assessment Cervical / Trunk Assessment: Kyphotic   Balance    End of Session PT - End of Session Equipment Utilized During Treatment: Gait belt Activity Tolerance: Patient tolerated treatment well Patient left: in bed;with call bell/phone within reach;with family/visitor present Nurse Communication: Mobility status;Other (comment) (no further PT needs. Please ambulate pt several times daily )  GP  Donnella Sham 01/21/2013, 2:54 PM [place], [place]  [phone] 01/21/2013

## 2013-01-21 NOTE — Progress Notes (Signed)
1 Day Post-Op  Subjective: Pt feeling very sore.  No flatus or bm.  Green bilious output from NGT.  Has not been out of bed yet.  Foley taken out about 1 hour ago, has not voided yet.  Objective: Vital signs in last 24 hours: Temp:  [97.7 F (36.5 C)-99 F (37.2 C)] 97.7 F (36.5 C) (08/01 0615) Pulse Rate:  [77-102] 88 (08/01 0615) Resp:  [16-22] 18 (08/01 0615) BP: (124-184)/(51-77) 170/77 mmHg (08/01 0615) SpO2:  [95 %-100 %] 97 % (08/01 0615) Last BM Date: 01/20/13 (Prior to surgery)  Intake/Output from previous day: 07/31 0701 - 08/01 0700 In: 1000 [I.V.:1000] Out: 1300 [Urine:1275; Blood:25] Intake/Output this shift:    General appearance: alert, cooperative, appears stated age and no distress Resp: clear to auscultation bilaterally Cardio: regular rate and rhythm, S1, S2 normal, no murmur, click, rub or gallop GI: mildly distended, no bowel sounds, midline incision with minimal serosanguinous drainage, staples are intact and without erythema.  No masses or hernias Extremities: extremities normal, atraumatic, no cyanosis or edema, SCDs  Lab Results:   Recent Labs  01/20/13 1532 01/21/13 0447  WBC 7.7 5.5  HGB 12.7* 12.7*  HCT 37.4* 36.3*  PLT 160 135*   BMET  Recent Labs  01/20/13 0530 01/20/13 1532 01/21/13 0447  NA 135  --  138  K 3.8  --  3.8  CL 100  --  102  CO2 27  --  29  GLUCOSE 171*  --  153*  BUN 11  --  8  CREATININE 0.80 0.89 0.89  CALCIUM 9.3  --  8.6   PT/INR  Recent Labs  01/19/13 2239  LABPROT 12.4  INR 0.94   Studies/Results: Ct Abdomen Pelvis W Contrast  01/20/2013   *RADIOLOGY REPORT*  Clinical Data: Abdominal pain.  History of sub total colectomy. Lysis of adhesions for small bowel obstruction.  CT ABDOMEN AND PELVIS WITH CONTRAST  Technique:  Multidetector CT imaging of the abdomen and pelvis was performed following the standard protocol during bolus administration of intravenous contrast.  Contrast: OMNIPAQUE IOHEXOL  300 MG/ML  SOLN  Comparison: Radiographs 01/19/2013.  Findings: Lung Bases: Dependent atelectasis at the right lung base. Suggestion of emphysema. Coronary artery atherosclerosis is present. If office based assessment of coronary risk factors has not been performed, it is now recommended.  Liver:  Grossly normal.  Perihepatic ascites is present.  Spleen:  Normal.  Gallbladder:  Intermediate attenuation of the gallbladder compatible with biliary sludge.  Common bile duct:  Within normal limits.  No obstruction.  Pancreas:  Atrophic, as expected for age.  Adrenal glands:  Normal.  Kidneys:  Normal enhancement and delayed excretion of contrast.  No obstruction.  Stomach:  Distended stomach with small hiatal hernia.  Small bowel:  Complete small bowel obstruction is present.  There is a herniated loop of bowel that is the transition point in the right lower quadrant.   This is probably incarcerated.  There is no pneumatosis.  No perforation or free air.  Small volume of ascites is reactive.  Colon:   Almost completely decompressed.  Ascending colectomy.  Pelvic Genitourinary:  Distended urinary bladder.  Bones:  No aggressive osseous lesions.  Vasculature: Atherosclerosis without acute vascular abnormality. No aneurysm.  Body Wall: Midline scarring.  Fat containing left inguinal hernia.  IMPRESSION: 1.  Complete small bowel obstruction with transition point in the right lower quadrant.  Although this resembles a spigelian hernia, the hernia actually occurs through the  rectus abdominus proper. This may have been the site of prior ostomy.  Incarceration is suspected.  Recommend clinical assessment for reducibility. 2.  Right colectomy. 3.  Reactive ascites.   Original Report Authenticated By: Andreas Newport, M.D.   Dg Abd Acute W/chest  01/19/2013   *RADIOLOGY REPORT*  Clinical Data: Abdominal pain and nausea.  History of prior colectomy.  ACUTE ABDOMEN SERIES (ABDOMEN 2 VIEW & CHEST 1 VIEW)  Comparison: 04/06/2012   Findings: The chest x-ray shows clear lungs.  Heart size is normal. Thoracic spinal stimulator device has been placed since the prior study.  Abdominal films show prominent small bowel loops centrally measuring up to approximately 4.5 cm in diameter.  These loops contain air fluid levels on the upright film.  No significant gas is identified in the colon and findings are consistent with small bowel obstruction.  There is no evidence of free intraperitoneal air.  No abnormal calcifications are seen.  Mild degenerative changes are present in the lower lumbar spine.  IMPRESSION: Findings consistent with small bowel obstruction.  No evidence of free air.   Original Report Authenticated By: Irish Lack, M.D.   Dg Abd Portable 2v  01/20/2013   *RADIOLOGY REPORT*  Clinical Data: Small bowel obstruction  PORTABLE ABDOMEN - 2 VIEW  Comparison: 0029 hours  Findings: No free intraperitoneal gas on the left decubitus image. Air-fluid levels are noted.  Distended bowel loops in the right hemiabdomen. Overall small bowel distention has improved.  NG tube is in place with its tip in the fundus of the stomach.  Contrast fills the bladder.  Colonic gas is noted.  IMPRESSION: No free intraperitoneal gas.  Distended small bowel loops persist in the right side of the abdomen. Overall improvement in small bowel distention.   Original Report Authenticated By: Jolaine Click, M.D.    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

## 2013-01-22 MED ORDER — LORATADINE 10 MG PO TABS
10.0000 mg | ORAL_TABLET | Freq: Every day | ORAL | Status: DC
  Administered 2013-01-22 – 2013-01-23 (×2): 10 mg via ORAL
  Filled 2013-01-22 (×3): qty 1

## 2013-01-22 MED ORDER — HYDROCODONE-ACETAMINOPHEN 5-325 MG PO TABS
1.0000 | ORAL_TABLET | ORAL | Status: DC | PRN
Start: 2013-01-22 — End: 2013-01-24
  Administered 2013-01-22 – 2013-01-23 (×2): 2 via ORAL
  Administered 2013-01-23: 1 via ORAL
  Administered 2013-01-23: 2 via ORAL
  Filled 2013-01-22: qty 2
  Filled 2013-01-22: qty 1
  Filled 2013-01-22 (×2): qty 2

## 2013-01-22 NOTE — Progress Notes (Signed)
2 Days Post-Op  Subjective: Pt feeling much better, had an episode of nausea after receiving pain medication.  +flatus, ambulating in hallways.  Denies fever, chills or sweats.  Denies shortness of breath, dysuria.  Objective: Vital signs in last 24 hours: Temp:  [97.4 F (36.3 C)-100.9 F (38.3 C)] 100.9 F (38.3 C) (08/02 0553) Pulse Rate:  [82-91] 87 (08/02 0553) Resp:  [18] 18 (08/02 0553) BP: (125-157)/(64-90) 157/65 mmHg (08/02 0553) SpO2:  [99 %-100 %] 100 % (08/02 0553) Last BM Date: 01/20/13  Intake/Output from previous day: 08/01 0701 - 08/02 0700 In: 3416.7 [I.V.:3416.7] Out: 1375 [Urine:975; Emesis/NG output:400] Intake/Output this shift:   PE General appearance: alert, cooperative, appears stated age and no distress  Resp: clear to auscultation bilaterally  Cardio: regular rate and rhythm, S1, S2 normal, no murmur, click, rub or gallop  GI:+BS x4 quadrants, soft round and non distended.  Mildly tender.  Midline incision with minimal serosanguinous drainage, staples are intact and without erythema, dressing changed.  No masses or hernias  Extremities: extremities normal, atraumatic, no cyanosis or edema, SCDs  Lab Results:   Recent Labs  01/20/13 1532 01/21/13 0447  WBC 7.7 5.5  HGB 12.7* 12.7*  HCT 37.4* 36.3*  PLT 160 135*   BMET  Recent Labs  01/20/13 0530 01/20/13 1532 01/21/13 0447  NA 135  --  138  K 3.8  --  3.8  CL 100  --  102  CO2 27  --  29  GLUCOSE 171*  --  153*  BUN 11  --  8  CREATININE 0.80 0.89 0.89  CALCIUM 9.3  --  8.6   PT/INR  Recent Labs  01/19/13 2239  LABPROT 12.4  INR 0.94   ABG No results found for this basename: PHART, PCO2, PO2, HCO3,  in the last 72 hours  Studies/Results: No results found.  Anti-infectives: Anti-infectives   Start     Dose/Rate Route Frequency Ordered Stop   01/20/13 0930  cefOXitin (MEFOXIN) 2 g in dextrose 5 % 50 mL IVPB     2 g 100 mL/hr over 30 Minutes Intravenous  Once 01/20/13  0743 01/20/13 0950   01/20/13 0921  dextrose 5 % with cefOXitin (MEFOXIN) ADS Med    Comments:  Girtha Hake: cabinet override      01/20/13 0921 01/20/13 2129   01/20/13 0919  dextrose 5 % with cefOXitin (MEFOXIN) ADS Med    Comments:  Girtha Hake: cabinet override      01/20/13 0919 01/20/13 2129      Assessment/Plan: Incarcerated incisional hernia at old stoma site with small bowel obstruction  -POD #2 Ex Lap lysis of adhesion and hernia repair  -clinically improving, +bs, flatus.  Clamp NGT for four hours, if no n/v start on clears today. -ambulate-PT eval did not recommend further treatment.  -SCDs, heparin  -pain control, with toradol.  Check bmp tomorrow. -IVF until oral intake improves -low grade temp, no leukocytosis, use IS.  No further work up at this time. Elevated blood pressure  -likely due to pain, continue to monitor    LOS: 3 days   Bonner Puna [facility] ANP-BC Pager [phone]  01/22/2013  7:43 AM

## 2013-01-22 NOTE — Progress Notes (Signed)
Pt just had BM.   D/c NGT if no n/v after 4 hours.    Clears later today.

## 2013-01-23 LAB — BASIC METABOLIC PANEL
Calcium: 8.6 mg/dL (ref 8.4–10.5)
GFR calc Af Amer: >90 mL/min (ref >90)
GFR calc non Af Amer: 88 mL/min — ABNORMAL LOW (ref >90)
Sodium: 140 mEq/L (ref 135–145)

## 2013-01-23 LAB — CBC
MCH: 30.7 pg (ref 26.0–34.0)
MCHC: 35.4 g/dL (ref 30.0–36.0)
Platelets: 132 10*3/uL — ABNORMAL LOW (ref 150–400)
RBC: 3.32 MIL/uL — ABNORMAL LOW (ref 4.22–5.81)

## 2013-01-23 MED ORDER — PANTOPRAZOLE SODIUM 40 MG PO TBEC
40.0000 mg | DELAYED_RELEASE_TABLET | Freq: Every day | ORAL | Status: DC
  Administered 2013-01-23: 40 mg via ORAL
  Filled 2013-01-23: qty 1

## 2013-01-23 NOTE — Progress Notes (Signed)
3 Days Post-Op  Subjective: 4 loose stools yesterday.  No nausea or vomiting.  Ambulated 4 times yesterday.  Concerned about going home and having wife care for him. Tolerated full liquid diet yesterday.    Objective: Vital signs in last 24 hours: Temp:  [98.1 F (36.7 C)-98.2 F (36.8 C)] 98.1 F (36.7 C) (08/03 0525) Pulse Rate:  [71-77] 72 (08/03 0525) Resp:  [16] 16 (08/03 0525) BP: (130-146)/(64-74) 138/74 mmHg (08/03 0525) SpO2:  [99 %-100 %] 100 % (08/03 0525) Last BM Date: 01/22/13  Intake/Output from previous day: 08/02 0701 - 08/03 0700 In: 2417 [I.V.:2417] Out: 0  Intake/Output this shift:   PE  General appearance: alert, cooperative, appears stated age and no distress  Resp: clear to auscultation bilaterally  Cardio: regular rate and rhythm, S1, S2 normal, no murmur, click, rub or gallop  GI:+BS x4 quadrants, soft round and non distended. Mildly tender. Midline incision without any drainage.  Dressing was removed. staples are intact and without erythema, dressing changed. No masses or hernias  Extremities: extremities normal, atraumatic, no cyanosis or edema, SCDs  Lab Results:   Recent Labs  01/21/13 0447 01/23/13 0405  WBC 5.5 3.0*  HGB 12.7* 10.2*  HCT 36.3* 28.8*  PLT 135* 132*   BMET  Recent Labs  01/21/13 0447 01/23/13 0405  NA 138 140  K 3.8 4.0  CL 102 109  CO2 29 27  GLUCOSE 153* 120*  BUN 8 7  CREATININE 0.89 0.82  CALCIUM 8.6 8.6   Anti-infectives: Anti-infectives   Start     Dose/Rate Route Frequency Ordered Stop   01/20/13 0930  cefOXitin (MEFOXIN) 2 g in dextrose 5 % 50 mL IVPB     2 g 100 mL/hr over 30 Minutes Intravenous  Once 01/20/13 0743 01/20/13 0950   01/20/13 0921  dextrose 5 % with cefOXitin (MEFOXIN) ADS Med    Comments:  Girtha Hake: cabinet override      01/20/13 0921 01/20/13 2129   01/20/13 0919  dextrose 5 % with cefOXitin (MEFOXIN) ADS Med    Comments:  Girtha Hake: cabinet override   01/20/13 0919 01/20/13 2129      Assessment/Plan: Incarcerated incisional hernia at 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  Fevers have resolved. -ambulate-PT eval did not recommend further treatment.  -SCDs, heparin  -DC IVF -IS 2000 -advance diet, if he is able to tolerate discharge today.   LOS: 4 days    Bonner Puna [facility] ANP-BC Pager [phone]  01/23/2013 8:06 AM

## 2013-01-23 NOTE — Progress Notes (Signed)
Looks good but febile.  Maybe home today or Monday.

## 2013-01-24 NOTE — Progress Notes (Signed)
Patient discharged to home with instructions. 

## 2013-01-24 NOTE — Discharge Summary (Signed)
Physician Discharge Summary  Kent [name] ZOX:[id number] DOB: [birth year] DOA: 01/19/2013  PCP: Pcp Not In System  Consultation: none  Admit date: 01/19/2013 Discharge date: 01/24/2013  Recommendations for Outpatient Follow-up:   Follow-up Information   Follow up with [facility], MD. (fri or mon to have staples removed with nurse.  2 week follow up with dr. Janee Morn)    Contact information:   [address] [place] [address] [phone]      Discharge Diagnoses:  1.  Small bowel obstruction 2.  Incarcerated incisional hernia  Surgical Procedure: exploratory laparotomy, lysis of adhesion, hernia repair(01/20/13 Dr. 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  NGT placed and a CT was obtained which revealed a complete small bowel obstruction, incarcerated hernia at previous ostomy site.  He underwent emergent surgery listed above. He had an uneventful post operative recovery.  He was treated initially with NGT, this was stopped and he was started on a diet and advanced as tolerated.  He takes chronic pain medication for his back, but did quite well using norco and toradol.  He was mobilized.  He was noted to have an elevated blood pressure, which resolved with better management of his pain.  He had a low grade temperature which resolved with the patient utilizing incentive spirometry.  On POD #4 he was ambulating, tolerating a regular diet, adequate pain control, VSS and therefore felt stable for discharge.  He will  schedule a follow up to have his staples removed and post operative check with Dr. Janee Morn.  We discussed warning signs that warrant immediate attention.  We discussed his pain management and agreed that since he does have a pain specialist he will go back on the oxycontin regimen and prn norco TID, which is a lot more than what he has actually been getting during his hospitalization.     General appearance: alert and oriented. Calm and cooperative No acute distress. VSS. Afebrile.  Resp: clear to auscultation bilaterally  Cardio: S1S1 RRR without murmurs or gallops. No edema. GI: soft round and mildly tender around the incision site.  Midline incision-edges are approximated, no erythema, staples are intact.  +BS x4 quadrants. No organomegaly, hernias or masses.  Pulses: +2 bilateral distal pulses without cyanosis   Discharge Instructions     Medication List         HYDROcodone-acetaminophen 5-325 MG per tablet  Commonly known as:  NORCO/VICODIN  Take 1 tablet by mouth every 8 (eight) hours as needed for pain.     methocarbamol 500 MG tablet  Commonly known as:  ROBAXIN  Take 1,000 mg by mouth 2 (two) times daily.     omeprazole 20 MG capsule  Commonly known as:  PRILOSEC  Take 40 mg by mouth daily.     oxyCODONE 20 MG 12 hr tablet  Commonly known as:  OXYCONTIN  Take 20 mg by mouth every 12 (twelve) hours.           Follow-up Information  Follow up with Liz Malady, MD. (fri or mon to have staples removed with nurse.  2 week follow up with dr. Janee Morn)    Contact information:   [address] [place] [address] [phone]        The results of significant diagnostics from this hospitalization (including imaging, microbiology, ancillary and laboratory) are listed below for reference.    Significant Diagnostic Studies: Ct Abdomen Pelvis W Contrast  01/20/2013   *RADIOLOGY REPORT*  Clinical Data: Abdominal pain.  History of sub total colectomy.  Lysis of adhesions for small bowel obstruction.  CT ABDOMEN AND PELVIS WITH CONTRAST  Technique:  Multidetector CT imaging of the abdomen and pelvis was performed following the standard protocol during bolus administration of intravenous contrast.  Contrast: OMNIPAQUE IOHEXOL 300 MG/ML  SOLN  Comparison: Radiographs 01/19/2013.  Findings: Lung Bases: Dependent atelectasis at the right lung base. Suggestion of emphysema. Coronary artery atherosclerosis is present. If office based assessment of coronary risk factors has not been performed, it is now recommended.  Liver:  Grossly normal.  Perihepatic ascites is present.  Spleen:  Normal.  Gallbladder:  Intermediate attenuation of the gallbladder compatible with biliary sludge.  Common bile duct:  Within normal limits.  No obstruction.  Pancreas:  Atrophic, as expected for age.  Adrenal glands:  Normal.  Kidneys:  Normal enhancement and delayed excretion of contrast.  No obstruction.  Stomach:  Distended stomach with small hiatal hernia.  Small bowel:  Complete small bowel obstruction is present.  There is a herniated loop of bowel that is the transition point in the right lower quadrant.   This is probably incarcerated.  There is no pneumatosis.  No perforation or free air.  Small volume of ascites is reactive.  Colon:   Almost completely decompressed.  Ascending colectomy.  Pelvic Genitourinary:  Distended urinary bladder.  Bones:  No aggressive osseous lesions.  Vasculature: Atherosclerosis without acute vascular abnormality. No aneurysm.  Body Wall: Midline scarring.  Fat containing left inguinal hernia.  IMPRESSION: 1.  Complete small bowel obstruction with transition point in the right lower quadrant.  Although this resembles a spigelian hernia, the hernia actually occurs through the rectus abdominus proper. This may have been the site of prior ostomy.  Incarceration is suspected.  Recommend clinical assessment for reducibility. 2.  Right colectomy. 3.   Reactive ascites.   Original Report Authenticated By: Andreas Newport, M.D.   Dg Abd Acute W/chest  01/19/2013   *RADIOLOGY REPORT*  Clinical Data: Abdominal pain and nausea.  History of prior colectomy.  ACUTE ABDOMEN SERIES (ABDOMEN 2 VIEW & CHEST 1 VIEW)  Comparison: 04/06/2012  Findings: The chest x-ray shows clear lungs.  Heart size is normal. Thoracic spinal stimulator device has been placed since the prior study.  Abdominal films show prominent small bowel loops centrally measuring up to approximately 4.5 cm in diameter.  These loops contain air fluid levels on the upright film.  No significant gas is identified in the colon and findings are consistent with small bowel obstruction.  There is no evidence of free intraperitoneal air.  No abnormal calcifications are seen.  Mild degenerative changes are present in the lower lumbar spine.  IMPRESSION: Findings consistent with small bowel obstruction.  No evidence of free air.   Original Report Authenticated By: Irish Lack, M.D.   Dg Abd Portable 2v  01/20/2013   *RADIOLOGY REPORT*  Clinical Data: Small bowel obstruction  PORTABLE ABDOMEN - 2 VIEW  Comparison: 0029  hours  Findings: No free intraperitoneal gas on the left decubitus image. Air-fluid levels are noted.  Distended bowel loops in the right hemiabdomen. Overall small bowel distention has improved.  NG tube is in place with its tip in the fundus of the stomach.  Contrast fills the bladder.  Colonic gas is noted.  IMPRESSION: No free intraperitoneal gas.  Distended small bowel loops persist in the right side of the abdomen. Overall improvement in small bowel distention.   Original Report Authenticated By: Jolaine Click, M.D.    Microbiology: No results found for this or any previous visit (from the past 240 hour(s)).   Labs: Basic Metabolic Panel:  Recent Labs Lab 01/19/13 1819 01/20/13 0530 01/20/13 1532 01/21/13 0447 01/23/13 0405  NA 136 135  --  138 140  K 4.2 3.8  --  3.8 4.0   CL 98 100  --  102 109  CO2 28 27  --  29 27  GLUCOSE 137* 171*  --  153* 120*  BUN 11 11  --  8 7  CREATININE 0.95 0.80 0.89 0.89 0.82  CALCIUM 10.2 9.3  --  8.6 8.6   Liver Function Tests:  Recent Labs Lab 01/19/13 1819  AST 32  ALT 44  ALKPHOS 90  BILITOT 0.9  PROT 8.2  ALBUMIN 4.4   CBC:  Recent Labs Lab 01/19/13 1819 01/20/13 0530 01/20/13 1532 01/21/13 0447 01/23/13 0405  WBC 12.1* 12.4* 7.7 5.5 3.0*  NEUTROABS 10.6*  --   --   --   --   HGB 14.9 14.1 12.7* 12.7* 10.2*  HCT 43.2 39.3 37.4* 36.3* 28.8*  MCV 86.1 85.6 87.4 86.6 86.7  PLT 212 187 160 135* 132*   Cardiac Enzymes:  Recent Labs Lab 01/19/13 2239  TROPONINI <0.30    Signed:  Emina Riebock, ANP-BC

## 2013-01-28 ENCOUNTER — Ambulatory Visit (INDEPENDENT_AMBULATORY_CARE_PROVIDER_SITE_OTHER): Payer: Medicare Other

## 2013-01-28 VITALS — BP 110/54 | HR 76 | Temp 97.6°F | Resp 18 | Wt 162.0 lb

## 2013-01-28 DIAGNOSIS — Z4802 Encounter for removal of sutures: Secondary | ICD-10-CM

## 2013-01-28 NOTE — Progress Notes (Signed)
Pt in office for staple removal. Pt states doing well. Eating well. Having daily BMs.Wound clean,dry,cool to touch. No redness. Staples removed. Benzoin and steri strips applied. Pt given f/u appt. Pt to call with concerns.

## 2013-01-31 ENCOUNTER — Encounter (INDEPENDENT_AMBULATORY_CARE_PROVIDER_SITE_OTHER): Payer: Medicare Other

## 2013-02-02 ENCOUNTER — Ambulatory Visit (INDEPENDENT_AMBULATORY_CARE_PROVIDER_SITE_OTHER): Payer: Medicare Other | Admitting: General Surgery

## 2013-02-02 ENCOUNTER — Encounter (INDEPENDENT_AMBULATORY_CARE_PROVIDER_SITE_OTHER): Payer: Self-pay | Admitting: General Surgery

## 2013-02-02 VITALS — BP 132/66 | HR 78 | Temp 96.9°F | Resp 15 | Ht 71.0 in | Wt 163.2 lb

## 2013-02-02 DIAGNOSIS — K432 Incisional hernia without obstruction or gangrene: Secondary | ICD-10-CM | POA: Insufficient documentation

## 2013-02-02 HISTORY — DX: Incisional hernia without obstruction or gangrene: K43.2

## 2013-02-02 NOTE — Progress Notes (Signed)
Subjective:     Patient ID: Kent [name], male   DOB: [birth year], 72 y.o.   MRN: [id number]  HPI Patient is status post exploratory laparotomy for repair of incarcerated incisional hernia and lysis of adhesions. He is taking some MiraLAX. He is having 2 bowel movements a day. He is walking. He is otherwise avoiding heavy activity.  Review of Systems     Objective:   Physical Exam Abdomen is soft and nontender. Stomal site hernia repair is intact. Midline incision is healing well. Steri-Strips were removed.    Assessment:     Doing very well after repair of incarcerated incisional hernia at stoma site and lysis of adhesions    Plan:     For heavy activities until 03/07/2013. He will continue walking. He will take his MiraLAX for another couple weeks and then as needed. Optimally he will have 2 bowel movements a day. He has had previous subtotal colectomy. Return when necessary.

## 2013-03-25 ENCOUNTER — Telehealth (INDEPENDENT_AMBULATORY_CARE_PROVIDER_SITE_OTHER): Payer: Self-pay | Admitting: *Deleted

## 2013-03-25 NOTE — Telephone Encounter (Signed)
Patient called to ask for an appt to be seen today due to diarrhea and he thinks he is dehydrated.  Advised patient that he really needs to be seen by his PMD for these symptoms.  Patient had a hernia repair 01/20/13.  Tried to explain to doctor that it is unlikely that these symptoms would have started over 2 months following surgery and be related to his surgery.  Patient states he will call his PMD to try to get in to see them.

## 2013-08-12 ENCOUNTER — Encounter (HOSPITAL_COMMUNITY): Payer: Self-pay | Admitting: Emergency Medicine

## 2013-08-12 ENCOUNTER — Observation Stay (HOSPITAL_COMMUNITY): Payer: Medicare Other

## 2013-08-12 ENCOUNTER — Emergency Department (HOSPITAL_COMMUNITY): Payer: Medicare Other

## 2013-08-12 ENCOUNTER — Inpatient Hospital Stay (HOSPITAL_COMMUNITY)
Admission: EM | Admit: 2013-08-12 | Discharge: 2013-08-20 | DRG: 390 | Disposition: A | Discharge status: Home / Self Care | Payer: Medicare Other | Attending: General Surgery | Admitting: General Surgery

## 2013-08-12 DIAGNOSIS — K439 Ventral hernia without obstruction or gangrene: Secondary | ICD-10-CM | POA: Diagnosis present

## 2013-08-12 DIAGNOSIS — G8929 Other chronic pain: Secondary | ICD-10-CM

## 2013-08-12 DIAGNOSIS — Z9889 Other specified postprocedural states: Secondary | ICD-10-CM

## 2013-08-12 DIAGNOSIS — K219 Gastro-esophageal reflux disease without esophagitis: Secondary | ICD-10-CM | POA: Diagnosis present

## 2013-08-12 DIAGNOSIS — M549 Dorsalgia, unspecified: Secondary | ICD-10-CM | POA: Diagnosis present

## 2013-08-12 DIAGNOSIS — K56609 Unspecified intestinal obstruction, unspecified as to partial versus complete obstruction: Principal | ICD-10-CM | POA: Diagnosis present

## 2013-08-12 DIAGNOSIS — R109 Unspecified abdominal pain: Secondary | ICD-10-CM

## 2013-08-12 DIAGNOSIS — J329 Chronic sinusitis, unspecified: Secondary | ICD-10-CM | POA: Diagnosis present

## 2013-08-12 HISTORY — DX: Intestinal adhesions (bands), unspecified as to partial versus complete obstruction: K56.50

## 2013-08-12 HISTORY — DX: Gastro-esophageal reflux disease without esophagitis: K21.9

## 2013-08-12 HISTORY — DX: Other chronic pain: G89.29

## 2013-08-12 HISTORY — DX: Dorsalgia, unspecified: M54.9

## 2013-08-12 HISTORY — DX: Unspecified osteoarthritis, unspecified site: M19.90

## 2013-08-12 HISTORY — DX: Incisional hernia without obstruction or gangrene: K43.2

## 2013-08-12 LAB — URINALYSIS, ROUTINE W REFLEX MICROSCOPIC
BILIRUBIN URINE: NEGATIVE
Glucose, UA: NEGATIVE mg/dL
HGB URINE DIPSTICK: NEGATIVE
KETONES UR: NEGATIVE mg/dL
Leukocytes, UA: NEGATIVE
NITRITE: NEGATIVE
PROTEIN: NEGATIVE mg/dL
SPECIFIC GRAVITY, URINE: 1.015 (ref 1.005–1.030)
UROBILINOGEN UA: 0.2 mg/dL (ref 0.0–1.0)
pH: 7.5 (ref 5.0–8.0)

## 2013-08-12 LAB — COMPREHENSIVE METABOLIC PANEL
ALBUMIN: 4 g/dL (ref 3.5–5.2)
ALT: 17 U/L (ref 0–53)
AST: 18 U/L (ref 0–37)
Alkaline Phosphatase: 79 U/L (ref 39–117)
BILIRUBIN TOTAL: 0.4 mg/dL (ref 0.3–1.2)
BUN: 9 mg/dL (ref 6–23)
CHLORIDE: 100 meq/L (ref 96–112)
CO2: 29 mEq/L (ref 19–32)
CREATININE: 0.93 mg/dL (ref 0.50–1.35)
Calcium: 10 mg/dL (ref 8.4–10.5)
GFR calc Af Amer: >90 mL/min (ref >90)
GFR calc non Af Amer: 83 mL/min — ABNORMAL LOW (ref >90)
Glucose, Bld: 118 mg/dL — ABNORMAL HIGH (ref 70–99)
POTASSIUM: 4 meq/L (ref 3.7–5.3)
Sodium: 141 mEq/L (ref 137–147)
TOTAL PROTEIN: 7.4 g/dL (ref 6.0–8.3)

## 2013-08-12 LAB — CBC
HCT: 36.6 % — ABNORMAL LOW (ref 39.0–52.0)
Hemoglobin: 12.6 g/dL — ABNORMAL LOW (ref 13.0–17.0)
MCH: 29.9 pg (ref 26.0–34.0)
MCHC: 34.4 g/dL (ref 30.0–36.0)
MCV: 86.9 fL (ref 78.0–100.0)
Platelets: 163 10*3/uL (ref 150–400)
RBC: 4.21 MIL/uL — ABNORMAL LOW (ref 4.22–5.81)
RDW: 13.3 % (ref 11.5–15.5)
WBC: 5.5 10*3/uL (ref 4.0–10.5)

## 2013-08-12 LAB — CBC WITH DIFFERENTIAL/PLATELET
BASOS ABS: 0 10*3/uL (ref 0.0–0.1)
BASOS PCT: 1 % (ref 0–1)
EOS ABS: 0.2 10*3/uL (ref 0.0–0.7)
Eosinophils Relative: 4 % (ref 0–5)
HCT: 38.4 % — ABNORMAL LOW (ref 39.0–52.0)
Hemoglobin: 13.4 g/dL (ref 13.0–17.0)
Lymphocytes Relative: 19 % (ref 12–46)
Lymphs Abs: 1.1 10*3/uL (ref 0.7–4.0)
MCH: 30.2 pg (ref 26.0–34.0)
MCHC: 34.9 g/dL (ref 30.0–36.0)
MCV: 86.5 fL (ref 78.0–100.0)
MONO ABS: 0.4 10*3/uL (ref 0.1–1.0)
Monocytes Relative: 7 % (ref 3–12)
NEUTROS ABS: 3.9 10*3/uL (ref 1.7–7.7)
NEUTROS PCT: 70 % (ref 43–77)
Platelets: 188 10*3/uL (ref 150–400)
RBC: 4.44 MIL/uL (ref 4.22–5.81)
RDW: 13.3 % (ref 11.5–15.5)
WBC: 5.5 10*3/uL (ref 4.0–10.5)

## 2013-08-12 LAB — I-STAT CG4 LACTIC ACID, ED: Lactic Acid, Venous: 0.72 mmol/L (ref 0.5–2.2)

## 2013-08-12 LAB — CREATININE, SERUM
CREATININE: 0.95 mg/dL (ref 0.50–1.35)
GFR calc Af Amer: >90 mL/min (ref >90)
GFR, EST NON AFRICAN AMERICAN: 82 mL/min — AB (ref >90)

## 2013-08-12 MED ORDER — ENOXAPARIN SODIUM 30 MG/0.3ML ~~LOC~~ SOLN: 30.0000 mg | SUBCUTANEOUS | Status: DC

## 2013-08-12 MED ORDER — POTASSIUM CHLORIDE IN NACL 20-0.9 MEQ/L-% IV SOLN
INTRAVENOUS | Status: DC
  Administered 2013-08-12 – 2013-08-18 (×11): via INTRAVENOUS
  Filled 2013-08-12 (×17): qty 1000

## 2013-08-12 MED ORDER — SODIUM CHLORIDE 0.9 % IV SOLN
Freq: Once | INTRAVENOUS | Status: AC
  Administered 2013-08-12: 09:00:00 via INTRAVENOUS

## 2013-08-12 MED ORDER — HYDROMORPHONE HCL PF 1 MG/ML IJ SOLN
1.0000 mg | Freq: Once | INTRAMUSCULAR | Status: AC
  Administered 2013-08-12: 1 mg via INTRAVENOUS
  Filled 2013-08-12: qty 1

## 2013-08-12 MED ORDER — ONDANSETRON HCL 4 MG/2ML IJ SOLN
4.0000 mg | Freq: Four times a day (QID) | INTRAMUSCULAR | Status: DC | PRN
  Administered 2013-08-12 – 2013-08-18 (×6): 4 mg via INTRAVENOUS
  Filled 2013-08-12 (×6): qty 2

## 2013-08-12 MED ORDER — DIPHENHYDRAMINE HCL 12.5 MG/5ML PO ELIX: 12.5000 mg | ORAL_SOLUTION | Freq: Four times a day (QID) | ORAL | Status: DC | PRN

## 2013-08-12 MED ORDER — HYDROMORPHONE HCL PF 1 MG/ML IJ SOLN
0.5000 mg | INTRAMUSCULAR | Status: DC | PRN
  Administered 2013-08-12 (×2): 1 mg via INTRAVENOUS
  Administered 2013-08-12: 0.5 mg via INTRAVENOUS
  Administered 2013-08-13 – 2013-08-20 (×21): 1 mg via INTRAVENOUS
  Filled 2013-08-12 (×24): qty 1

## 2013-08-12 MED ORDER — IOHEXOL 300 MG/ML  SOLN: 25.0000 mL | INTRAMUSCULAR | Status: AC

## 2013-08-12 MED ORDER — ONDANSETRON HCL 4 MG/2ML IJ SOLN
4.0000 mg | Freq: Once | INTRAMUSCULAR | Status: DC
  Filled 2013-08-12: qty 2

## 2013-08-12 MED ORDER — DIPHENHYDRAMINE HCL 50 MG/ML IJ SOLN: 12.5000 mg | Freq: Four times a day (QID) | INTRAMUSCULAR | Status: DC | PRN

## 2013-08-12 MED ORDER — PANTOPRAZOLE SODIUM 40 MG IV SOLR
40.0000 mg | Freq: Every day | INTRAVENOUS | Status: DC
  Administered 2013-08-12 – 2013-08-16 (×5): 40 mg via INTRAVENOUS
  Filled 2013-08-12 (×7): qty 40

## 2013-08-12 MED ORDER — IOHEXOL 300 MG/ML  SOLN
80.0000 mL | Freq: Once | INTRAMUSCULAR | Status: AC | PRN
  Administered 2013-08-12: 80 mL via INTRAVENOUS

## 2013-08-12 MED ORDER — ONDANSETRON HCL 4 MG/2ML IJ SOLN
4.0000 mg | Freq: Once | INTRAMUSCULAR | Status: AC
  Administered 2013-08-12: 4 mg via INTRAVENOUS

## 2013-08-12 MED ORDER — ENOXAPARIN SODIUM 40 MG/0.4ML ~~LOC~~ SOLN
40.0000 mg | SUBCUTANEOUS | Status: DC
  Administered 2013-08-12 – 2013-08-19 (×8): 40 mg via SUBCUTANEOUS
  Filled 2013-08-12 (×9): qty 0.4

## 2013-08-12 NOTE — ED Notes (Addendum)
CT called. Patient finished omnipaque. Patient made aware of need for urine. Unable to void at this time.

## 2013-08-12 NOTE — ED Notes (Signed)
Surgeon left the bedside. Patient comfortable and acknowledges need for inpatient treatment.

## 2013-08-12 NOTE — ED Notes (Signed)
Patient transported to CT 

## 2013-08-12 NOTE — ED Provider Notes (Signed)
CSN: [id number]     Arrival date & time 08/12/13  4235 History   None    No chief complaint on file.    (Consider location/radiation/quality/duration/timing/severity/associated sxs/prior Treatment) Patient is a 73 y.o. male presenting with abdominal pain. The history is provided by the patient. No language interpreter was used.  Abdominal Pain Pain location:  Generalized Pain quality: aching, cramping and pressure   Pain radiates to:  Does not radiate Pain severity:  Moderate Onset quality:  Gradual Duration:  1 day Timing:  Constant Progression:  Worsening Chronicity:  New Context: not recent travel and not trauma   Relieved by:  Nothing Ineffective treatments:  None tried Associated symptoms: nausea   Associated symptoms: no constipation, no cough and no vomiting   Risk factors: multiple surgeries   Pt reports not passing gas since yesterday.   Pt has had multiple previous surgeries,  Hx of multiple hernias and bowel obstructions  No past medical history on file. Past Surgical History  Procedure Laterality Date  . Abdominal surgery    . Colon surgery    . Back surgery    . Incision and drainage foot    . Knee arthroscopy    . Shoulder arthroscopy    . Laparotomy N/A 01/20/2013    Procedure: EXPLORATORY LAPAROTOMY;  Surgeon: Zenovia Jarred, MD;  Location: [place];  Service: General;  Laterality: N/A;  . Lysis of adhesion N/A 01/20/2013    Procedure: LYSIS OF ADHESION;  Surgeon: Zenovia Jarred, MD;  Location: [place];  Service: General;  Laterality: N/A;  . Incisional hernia repair N/A 01/20/2013    Procedure: HERNIA REPAIR INCISIONAL;  Surgeon: Zenovia Jarred, MD;  Location: [place];  Service: General;  Laterality: N/A;   No family history on file. History  Substance Use Topics  . Smoking status: Never Smoker   . Smokeless tobacco: Never Used  . Alcohol Use: No    Review of Systems  Respiratory: Negative for cough.   Gastrointestinal: Positive for nausea and  abdominal pain. Negative for vomiting and constipation.  All other systems reviewed and are negative.      Allergies  Review of patient's allergies indicates no known allergies.  Home Medications   Current Outpatient Rx  Name  Route  Sig  Dispense  Refill  . HYDROcodone-acetaminophen (NORCO/VICODIN) 5-325 MG per tablet   Oral   Take 1 tablet by mouth every 8 (eight) hours as needed for pain.         . methocarbamol (ROBAXIN) 500 MG tablet   Oral   Take 1,000 mg by mouth 2 (two) times daily.          Marland Kitchen omeprazole (PRILOSEC) 20 MG capsule   Oral   Take 40 mg by mouth daily.         Marland Kitchen oxyCODONE (OXYCONTIN) 20 MG 12 hr tablet   Oral   Take 20 mg by mouth every 12 (twelve) hours.          There were no vitals taken for this visit. Physical Exam  Nursing note and vitals reviewed. Constitutional: He is oriented to person, place, and time. He appears well-developed and well-nourished.  HENT:  Head: Normocephalic.  Right Ear: External ear normal.  Left Ear: External ear normal.  Nose: Nose normal.  Mouth/Throat: Oropharynx is clear and moist.  Eyes: Conjunctivae and EOM are normal. Pupils are equal, round, and reactive to light.  Neck: Normal range of motion. Neck supple.  Cardiovascular: Normal rate  and normal heart sounds.   Pulmonary/Chest: He is in respiratory distress.  Abdominal: He exhibits distension. There is tenderness.  Musculoskeletal: Normal range of motion.  Neurological: He is alert and oriented to person, place, and time. He has normal reflexes.  Skin: Skin is warm.  Psychiatric: He has a normal mood and affect.    ED Course  Procedures (including critical care time) Labs Review Labs Reviewed - No data to display Imaging Review No results found.  EKG Interpretation   None       MDM Ct shows mid to distal small bowel obstruction 2nd to    Final diagnoses:  Small bowel obstruction  Ventral hernia   Ng tube placed,   Dr. Waverly Ferrari in to  see and examine pt. I spoke with Claiborne Billings from Surgery who will see her.      Weaver, PA-C 08/12/13 1223

## 2013-08-12 NOTE — H&P (Signed)
LATHANIEL LEGATE January 31, 1941  [id number].    Requesting MD: Dr. Betsey Holiday Chief Complaint/Reason for Consult: SBO   HPI:  73 y/o white male with PMH multiple abdominal surgeries including subtotal colectomy ileostomy, ileostomy takedown, Lap with LOA for SBO, and Lap for incisional hernia repair and LOA presents to [facility] on 08/12/13 with diffuse abdominal pain.  His 2 most recent surgeries were done by Dr. Georganna Skeans.  The pain started at 11pm on 08/11/13 which was excruciating cramping with nausea, but no vomiting.  It progressively worsened which brought him to the ER for further evaluation.  He says it feels just like his previous bowel obstructions.  No flatus, last BM was 5pm last night.  No alleviating or precipitating factors.  No radiating pain.  He complains of chronic constipation for which he has not started a bowel regimen for to this point.   No fevers/chills, SOB/CP, urinary symptoms.  Was just on Levaquin for a sinus infection.  ROS: All systems reviewed and otherwise negative except for as above  No family history on file.  Past Medical History  Diagnosis Date  . 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 Chronic back pain     s/p back surgery  . GERD (gastroesophageal reflux disease)   . Incisional hernia     Past Surgical History  Procedure Laterality Date  . Colon surgery  04/2002    Dr. Deon Pilling - Subtotal colectomy, ileostomy  . Back surgery  08/25/2001    Neck 3 disk replacements  . Incision and drainage foot    . Knee arthroscopy  2010  . Shoulder arthroscopy Bilateral   . Laparotomy N/A 01/20/2013    Procedure: EXPLORATORY LAPAROTOMY;  Surgeon: Zenovia Jarred, MD;  Location: [place];  Service: General;  Laterality: N/A;  . Lysis of adhesion N/A 01/20/2013    Procedure: LYSIS OF ADHESION;  Surgeon: Zenovia Jarred, MD;  Location: [place];  Service: General;  Laterality: N/A;  . Incisional hernia repair N/A 01/20/2013    Procedure: HERNIA REPAIR INCISIONAL;  Surgeon:  Zenovia Jarred, MD;  Location: [place];  Service: General;  Laterality: N/A;  . Ileostomy takedown  08/2002    Dr. Deon Pilling  . Laparotomy with lysis of adhesions  2010    Dr. Grandville Silos - SBO  . Back surgery      Placement/removal of nerve stimulator  . Microsurgical  03/24/2002    Lateral recess decompression L5    Social History:  reports that he has never smoked. He has never used smokeless tobacco. He reports that he does not drink alcohol or use illicit drugs.  Allergies: No Known Allergies   (Not in a hospital admission)  Blood pressure 127/81, pulse 81, temperature 98.1 F (36.7 C), resp. rate 18, height '5\' 11"'  (1.803 m), weight 170 lb (77.111 kg), SpO2 92.00%.  Physical Exam: General: pleasant, WD/WN white male who is laying in bed in NAD HEENT: head is normocephalic, atraumatic.  Sclera are noninjected.  PERRL.  Ears and nose without any masses or lesions.  Mouth is pink and moist Heart: regular, rate, and rhythm.  No obvious murmurs, gallops, or rubs noted.  Palpable pedal pulses bilaterally Lungs: CTAB, no wheezes, rhonchi, or rales noted.  Respiratory effort nonlabored Abd: soft, moderate distension, TTP RLQ and most severe at midline, wide mouth incisional hernia, +BS, no masses,  or organomegaly, midline scar, RLQ scar, few left sided laparoscopic scars, no peritoneal signs  MS: all 4 extremities are  symmetrical with no cyanosis, clubbing, or edema. Skin: warm and dry with no masses, lesions, or rashes Psych: A&Ox3 with an appropriate affect.   Results for orders placed during the hospital encounter of 08/12/13 (from the past 48 hour(s))  CBC WITH DIFFERENTIAL     Status: Abnormal   Collection Time    08/12/13  9:26 AM      Result Value Ref Range   WBC 5.5  4.0 - 10.5 K/uL   RBC 4.44  4.22 - 5.81 MIL/uL   Hemoglobin 13.4  13.0 - 17.0 g/dL   HCT 38.4 (*) 39.0 - 52.0 %   MCV 86.5  78.0 - 100.0 fL   MCH 30.2  26.0 - 34.0 pg   MCHC 34.9  30.0 - 36.0 g/dL   RDW 13.3   11.5 - 15.5 %   Platelets 188  150 - 400 K/uL   Neutrophils Relative % 70  43 - 77 %   Neutro Abs 3.9  1.7 - 7.7 K/uL   Lymphocytes Relative 19  12 - 46 %   Lymphs Abs 1.1  0.7 - 4.0 K/uL   Monocytes Relative 7  3 - 12 %   Monocytes Absolute 0.4  0.1 - 1.0 K/uL   Eosinophils Relative 4  0 - 5 %   Eosinophils Absolute 0.2  0.0 - 0.7 K/uL   Basophils Relative 1  0 - 1 %   Basophils Absolute 0.0  0.0 - 0.1 K/uL  COMPREHENSIVE METABOLIC PANEL     Status: Abnormal   Collection Time    08/12/13  9:26 AM      Result Value Ref Range   Sodium 141  137 - 147 mEq/L   Potassium 4.0  3.7 - 5.3 mEq/L   Chloride 100  96 - 112 mEq/L   CO2 29  19 - 32 mEq/L   Glucose, Bld 118 (*) 70 - 99 mg/dL   BUN 9  6 - 23 mg/dL   Creatinine, Ser 0.93  0.50 - 1.35 mg/dL   Calcium 10.0  8.4 - 10.5 mg/dL   Total Protein 7.4  6.0 - 8.3 g/dL   Albumin 4.0  3.5 - 5.2 g/dL   AST 18  0 - 37 U/L   ALT 17  0 - 53 U/L   Alkaline Phosphatase 79  39 - 117 U/L   Total Bilirubin 0.4  0.3 - 1.2 mg/dL   GFR calc non Af Amer 83 (*) >90 mL/min   GFR calc Af Amer >90  >90 mL/min   Comment: (NOTE)     The eGFR has been calculated using the CKD EPI equation.     This calculation has not been validated in all clinical situations.     eGFR's persistently <90 mL/min signify possible Chronic Kidney     Disease.  URINALYSIS, ROUTINE W REFLEX MICROSCOPIC     Status: None   Collection Time    08/12/13 11:03 AM      Result Value Ref Range   Color, Urine YELLOW  YELLOW   APPearance CLEAR  CLEAR   Specific Gravity, Urine 1.015  1.005 - 1.030   pH 7.5  5.0 - 8.0   Glucose, UA NEGATIVE  NEGATIVE mg/dL   Hgb urine dipstick NEGATIVE  NEGATIVE   Bilirubin Urine NEGATIVE  NEGATIVE   Ketones, ur NEGATIVE  NEGATIVE mg/dL   Protein, ur NEGATIVE  NEGATIVE mg/dL   Urobilinogen, UA 0.2  0.0 - 1.0 mg/dL   Nitrite NEGATIVE  NEGATIVE   Leukocytes, UA NEGATIVE  NEGATIVE   Comment: MICROSCOPIC NOT DONE ON URINES WITH NEGATIVE PROTEIN, BLOOD,  LEUKOCYTES, NITRITE, OR GLUCOSE <1000 mg/dL.  I-STAT CG4 LACTIC ACID, ED     Status: None   Collection Time    08/12/13 12:41 PM      Result Value Ref Range   Lactic Acid, Venous 0.72  0.5 - 2.2 mmol/L   Ct Abdomen Pelvis W Contrast  08/12/2013   CLINICAL DATA:  Abdominal discomfort, history of abdominal surgery  EXAM: CT ABDOMEN AND PELVIS WITH CONTRAST  TECHNIQUE: Multidetector CT imaging of the abdomen and pelvis was performed using the standard protocol following bolus administration of intravenous contrast.  CONTRAST:  67m OMNIPAQUE IOHEXOL 300 MG/ML SOLN intravenously. The patient also received oral contrast material.  COMPARISON:  DG ABD PORTABLE 2V dated 01/20/2013; CT ABD/PELVIS W CM dated 01/20/2013  FINDINGS: There is a large broadly necked midline ventral hernia which contains loops of moderately distended contrast and noncontrast- filled small bowel. More distally normal calibered small bowel loops are demonstrated. There is a portion of the colon which is involved with this hernia as well but no obstruction of the large bowel is demonstrated. The patient may have undergone previous partial colectomy. No significant colonic distention is demonstrated. A previously demonstrated right lower lateral abdominal wall hernia seen today on images 42-48 is not as conspicuous today. There is a loop of gas-filled bowel which enters this hernia for distance of approximately 1 cm but the focus does not appear obstructive. No free fluid or free air within the abdomen or pelvis is demonstrated.  The liver, spleen, moderately distended stomach, pancreas, adrenal glands, and kidneys exhibit no acute abnormalities. There may be calcified gallstones in the fundus on image 28, and a tiny stone in the gallbladder neck or proximal common bile duct may be present on image 20. The caliber of the abdominal aorta is normal. The psoas musculature is normal in appearance. Within the pelvis the urinary bladder is partially  distended and normal in appearance. The prostate gland and seminal vesicles appear normal. There are shallow fat containing inguinal hernias bilaterally.  The lumbar vertebral bodies are preserved in height. The bony pelvis exhibits no acute abnormalities. The lung bases are clear.  IMPRESSION: 1. There is a partial mid to distal small bowel obstruction which appears to be related to the broadly necked ventral hernia. There may be a volvulus of a portion of the small bowel within the hernia. No tight hernia neck is demonstrated. The small right lower lateral abdominal wall hernia does not appear to the resulting in any obstruction or incarceration currently. 2. There is no evidence of acute hepatobiliary abnormality. I cannot exclude a faintly calcified gallstones and possibly a gallbladder neck or proximal common bile duct stone not producing obstruction. 3. There is no acute urinary tract abnormality.   Electronically Signed   By: David  JMartinique  On: 08/12/2013 10:44   Dg Abd Portable 2v  08/12/2013   CLINICAL DATA:  Small bowel obstruction, followup, abdominal pain  EXAM: PORTABLE ABDOMEN - 2 VIEW  COMPARISON:  CT abdomen pelvis of 08/12/2013  FINDINGS: Supine and left lower decubitus films of the abdomen show persistently dilated loops of small bowel with air-fluid levels consistent with a partial small bowel obstruction. No definite colonic bowel gas is seen. Contrast is noted within the urinary bladder. No free air is seen on the left lateral decubitus of the abdomen. No opaque calculi are  noted.  IMPRESSION: Persistent partial small bowel obstruction.  No free air.   Electronically Signed   By: Ivar Drape M.D.   On: 08/12/2013 14:16      Assessment/Plan Small bowel obstruction Abdominal pain Nausea S/p multiple abdominal histories (4 laparotomies) Chronic back pain  Plan: 1.  Admit to [facility] 2.  NPO, NG tube, bowel rest, IVF, pain control, antiemetics 3.  SCD's and lovenox for DVT proph 4.   Ambulate and IS 5.  Continue with conservative management for now, hopefully will resolve on its own without surgical intervention. 6.  Will obtain a KUB in AM.   Coralie Keens, [facility] 08/12/2013, 2:18 PM Pager: [phone]

## 2013-08-12 NOTE — Progress Notes (Signed)
Admitted to room 6n10 from Ed, A/Ox4, wife at bedside, oriented to room and surroundings, c/o RUQ 4/10, denies nausea at this time.

## 2013-08-12 NOTE — Progress Notes (Addendum)
Attempted to place #16 NGT without success. Once NGT was advanced in left nare pt c/o that he could not catch his breath and NGT was removed. Same situation  that happened earlier in ED. Saverio Danker PA notified. Pt is now passing flatus and belching, he states it has been days since he did either.

## 2013-08-12 NOTE — ED Notes (Signed)
Patient returned from CT

## 2013-08-12 NOTE — H&P (Signed)
Agree with above, not sure he has hernia on ct or just thinned out ab wall. He may have one on exam though and is mildly tender rlq.  He will not let us try ng again. He states he has passed a little flatus and had a bm since earlier though.  We will recheck films am, if not better needs ng with radiology.  Understands may need surgery if not better.

## 2013-08-12 NOTE — ED Notes (Signed)
Patient denies being able to use the bathroom.

## 2013-08-12 NOTE — ED Provider Notes (Signed)
Medical screening examination/treatment/procedure(s) were conducted as a shared visit with non-physician practitioner(s) and myself.  I personally evaluated the patient during the encounter.  EKG Interpretation   None       Patient presents with complaints of abdominal pain and distention. Patient has had previous small bowel obstructions in the past. Repeat obstruction was suspected. X-ray did confirm. Patient will be admitted by general surgery for further management.  Orpah Greek, MD 08/12/13 [phone]

## 2013-08-12 NOTE — ED Notes (Signed)
Surgeon at bedside stated NG tube will be placed on floor.

## 2013-08-12 NOTE — ED Notes (Signed)
History of abdominal surgery and developed abdominal pain "feels like glass"  with nausea.

## 2013-08-12 NOTE — ED Notes (Addendum)
Attempted to place NG tube. EDP notified that pt was unable to tolerate. Waiting on surgery

## 2013-08-13 ENCOUNTER — Observation Stay (HOSPITAL_COMMUNITY): Payer: Medicare Other

## 2013-08-13 LAB — BASIC METABOLIC PANEL
BUN: 8 mg/dL (ref 6–23)
CALCIUM: 9.6 mg/dL (ref 8.4–10.5)
CHLORIDE: 103 meq/L (ref 96–112)
CO2: 25 mEq/L (ref 19–32)
CREATININE: 0.92 mg/dL (ref 0.50–1.35)
GFR, EST NON AFRICAN AMERICAN: 83 mL/min — AB (ref >90)
Glucose, Bld: 93 mg/dL (ref 70–99)
Potassium: 4.1 mEq/L (ref 3.7–5.3)
Sodium: 141 mEq/L (ref 137–147)

## 2013-08-13 LAB — URINE CULTURE
COLONY COUNT: NO GROWTH
Culture: NO GROWTH

## 2013-08-13 LAB — CBC
HEMATOCRIT: 36 % — AB (ref 39.0–52.0)
Hemoglobin: 12.2 g/dL — ABNORMAL LOW (ref 13.0–17.0)
MCH: 30 pg (ref 26.0–34.0)
MCHC: 33.9 g/dL (ref 30.0–36.0)
MCV: 88.5 fL (ref 78.0–100.0)
PLATELETS: 163 10*3/uL (ref 150–400)
RBC: 4.07 MIL/uL — ABNORMAL LOW (ref 4.22–5.81)
RDW: 13.4 % (ref 11.5–15.5)
WBC: 4.6 10*3/uL (ref 4.0–10.5)

## 2013-08-13 MED ORDER — WHITE PETROLATUM GEL
Status: AC
  Administered 2013-08-13: 0.2
  Filled 2013-08-13: qty 5

## 2013-08-13 NOTE — Progress Notes (Signed)
Subjective: Feeling much better. Had several loose stools. Minimal pain now. Hungry.  Abdominal x-rays showed some improvement in small bowel distention, a small bowel distention has not resolved radiographically.  WBC 4600. Potassium 4.1. Creatinine 0.92.  Objective: Vital signs in last 24 hours: Temp:  [97.9 F (36.6 C)-98.3 F (36.8 C)] 98.3 F (36.8 C) (02/21 1011) Pulse Rate:  [79-105] 82 (02/21 1011) Resp:  [17-19] 18 (02/21 1011) BP: (113-165)/(60-114) 139/69 mmHg (02/21 1011) SpO2:  [92 %-100 %] 97 % (02/21 1011) Weight:  [170 lb (77.111 kg)] 170 lb (77.111 kg) (02/20 1543) Last BM Date: 08/12/13  Intake/Output from previous day: 02/20 0701 - 02/21 0700 In: 1278.3 [I.V.:1278.3] Out: 675 [Urine:675] Intake/Output this shift:    General appearance: alert. Good spirits. Mental status normal. No distress whatsoever. Resp: clear to auscultation bilaterally GI: abdomen is soft and nontender. Minimally distended. Amo hernia in the upper midline.  Easily reducible.  Lab Results:   Recent Labs  08/12/13 1352 08/13/13 0453  WBC 5.5 4.6  HGB 12.6* 12.2*  HCT 36.6* 36.0*  PLT 163 163   BMET  Recent Labs  08/12/13 0926 08/12/13 1352 08/13/13 0453  NA 141  --  141  K 4.0  --  4.1  CL 100  --  103  CO2 29  --  25  GLUCOSE 118*  --  93  BUN 9  --  8  CREATININE 0.93 0.95 0.92  CALCIUM 10.0  --  9.6   PT/INR No results found for this basename: LABPROT, INR,  in the last 72 hours ABG No results found for this basename: PHART, PCO2, PO2, HCO3,  in the last 72 hours  Studies/Results: Ct Abdomen Pelvis W Contrast  08/12/2013   CLINICAL DATA:  Abdominal discomfort, history of abdominal surgery  EXAM: CT ABDOMEN AND PELVIS WITH CONTRAST  TECHNIQUE: Multidetector CT imaging of the abdomen and pelvis was performed using the standard protocol following bolus administration of intravenous contrast.  CONTRAST:  55mL OMNIPAQUE IOHEXOL 300 MG/ML SOLN  intravenously. The patient also received oral contrast material.  COMPARISON:  DG ABD PORTABLE 2V dated 01/20/2013; CT ABD/PELVIS W CM dated 01/20/2013  FINDINGS: There is a large broadly necked midline ventral hernia which contains loops of moderately distended contrast and noncontrast- filled small bowel. More distally normal calibered small bowel loops are demonstrated. There is a portion of the colon which is involved with this hernia as well but no obstruction of the large bowel is demonstrated. The patient may have undergone previous partial colectomy. No significant colonic distention is demonstrated. A previously demonstrated right lower lateral abdominal wall hernia seen today on images 42-48 is not as conspicuous today. There is a loop of gas-filled bowel which enters this hernia for distance of approximately 1 cm but the focus does not appear obstructive. No free fluid or free air within the abdomen or pelvis is demonstrated.  The liver, spleen, moderately distended stomach, pancreas, adrenal glands, and kidneys exhibit no acute abnormalities. There may be calcified gallstones in the fundus on image 28, and a tiny stone in the gallbladder neck or proximal common bile duct may be present on image 20. The caliber of the abdominal aorta is normal. The psoas musculature is normal in appearance. Within the pelvis the urinary bladder is partially distended and normal in appearance. The prostate gland and seminal vesicles appear normal. There are shallow fat containing inguinal hernias bilaterally.  The lumbar vertebral bodies are preserved in height. The bony pelvis exhibits  no acute abnormalities. The lung bases are clear.  IMPRESSION: 1. There is a partial mid to distal small bowel obstruction which appears to be related to the broadly necked ventral hernia. There may be a volvulus of a portion of the small bowel within the hernia. No tight hernia neck is demonstrated. The small right lower lateral abdominal  wall hernia does not appear to the resulting in any obstruction or incarceration currently. 2. There is no evidence of acute hepatobiliary abnormality. I cannot exclude a faintly calcified gallstones and possibly a gallbladder neck or proximal common bile duct stone not producing obstruction. 3. There is no acute urinary tract abnormality.   Electronically Signed   By: David  Martinique   On: 08/12/2013 10:44   Dg Abd 2 Views  08/13/2013   CLINICAL DATA:  Small-bowel obstruction  EXAM: ABDOMEN - 2 VIEW  COMPARISON:  08/12/2013  FINDINGS: Slight interval improvement in the small bowel gaseous distention throughout the abdomen. There are associated air-fluid levels within the small bowel loops on the upright exam. No free air evident. Lung bases clear. No osseous abnormality.  IMPRESSION: Slight interval improvement in small bowel distension/partial obstruction   Electronically Signed   By: Daryll Brod M.D.   On: 08/13/2013 09:11   Dg Abd Portable 2v  08/12/2013   CLINICAL DATA:  Small bowel obstruction, followup, abdominal pain  EXAM: PORTABLE ABDOMEN - 2 VIEW  COMPARISON:  CT abdomen pelvis of 08/12/2013  FINDINGS: Supine and left lower decubitus films of the abdomen show persistently dilated loops of small bowel with air-fluid levels consistent with a partial small bowel obstruction. No definite colonic bowel gas is seen. Contrast is noted within the urinary bladder. No free air is seen on the left lateral decubitus of the abdomen. No opaque calculi are noted.  IMPRESSION: Persistent partial small bowel obstruction.  No free air.   Electronically Signed   By: Ivar Drape M.D.   On: 08/12/2013 14:16    Anti-infectives: Anti-infectives   None      Assessment/Plan:  Small bowel obstruction, recurrent. Clinically, this is resolving although radiographically it is not completely resolved. No evidence of compromised bowel Begin liquid diet. Hopefully he will improve and we can advance to solid diet  tomorrow.  Status post multiple abdominal surgeries.   LOS: 1 day    Fleet Higham M. Dalbert Batman, M.D., [facility] General and Minimally invasive Surgery Breast and Colorectal Surgery Office:   [phone] Pager:   [phone]  08/13/2013

## 2013-08-13 NOTE — Progress Notes (Signed)
Utilization Review completed.  

## 2013-08-14 ENCOUNTER — Inpatient Hospital Stay (HOSPITAL_COMMUNITY): Payer: Medicare Other

## 2013-08-14 LAB — CBC
HCT: 34.5 % — ABNORMAL LOW (ref 39.0–52.0)
Hemoglobin: 12 g/dL — ABNORMAL LOW (ref 13.0–17.0)
MCH: 30.2 pg (ref 26.0–34.0)
MCHC: 34.8 g/dL (ref 30.0–36.0)
MCV: 86.9 fL (ref 78.0–100.0)
Platelets: 141 10*3/uL — ABNORMAL LOW (ref 150–400)
RBC: 3.97 MIL/uL — AB (ref 4.22–5.81)
RDW: 13.1 % (ref 11.5–15.5)
WBC: 4.2 10*3/uL (ref 4.0–10.5)

## 2013-08-14 LAB — BASIC METABOLIC PANEL
BUN: 7 mg/dL (ref 6–23)
CALCIUM: 9 mg/dL (ref 8.4–10.5)
CO2: 25 mEq/L (ref 19–32)
Chloride: 105 mEq/L (ref 96–112)
Creatinine, Ser: 0.85 mg/dL (ref 0.50–1.35)
GFR calc Af Amer: >90 mL/min (ref >90)
GFR, EST NON AFRICAN AMERICAN: 86 mL/min — AB (ref >90)
Glucose, Bld: 103 mg/dL — ABNORMAL HIGH (ref 70–99)
Potassium: 4 mEq/L (ref 3.7–5.3)
SODIUM: 138 meq/L (ref 137–147)

## 2013-08-14 NOTE — Progress Notes (Signed)
  Subjective: Having loose stools, numerous ones, no n/v, feels more distended   Objective: Vital signs in last 24 hours: Temp:  [97.9 F (36.6 C)] 97.9 F (36.6 C) (02/22 0533) Pulse Rate:  [73-78] 73 (02/22 0533) Resp:  [18-20] 20 (02/22 0533) BP: (108-136)/(73-75) 136/73 mmHg (02/22 0533) SpO2:  [98 %-99 %] 99 % (02/22 0533) Last BM Date: 2013/08/20  Intake/Output from previous day: 20-Aug-2022 0701 - 02/22 0700 In: 2055.4 [I.V.:2055.4] Out: -  Intake/Output this shift:    General appearance: no distress GI: distended nontender some bs present  Lab Results:   Recent Labs  August 20, 2013 0453 08/14/13 0635  WBC 4.6 4.2  HGB 12.2* 12.0*  HCT 36.0* 34.5*  PLT 163 141*   BMET  Recent Labs  2013-08-20 0453 08/14/13 0635  NA 141 138  K 4.1 4.0  CL 103 105  CO2 25 25  GLUCOSE 93 103*  BUN 8 7  CREATININE 0.92 0.85  CALCIUM 9.6 9.0   PT/INR No results found for this basename: LABPROT, INR,  in the last 72 hours ABG No results found for this basename: PHART, PCO2, PO2, HCO3,  in the last 72 hours  Studies/Results: Dg Abd 2 Views  08/20/13   CLINICAL DATA:  Small-bowel obstruction  EXAM: ABDOMEN - 2 VIEW  COMPARISON:  08/12/2013  FINDINGS: Slight interval improvement in the small bowel gaseous distention throughout the abdomen. There are associated air-fluid levels within the small bowel loops on the upright exam. No free air evident. Lung bases clear. No osseous abnormality.  IMPRESSION: Slight interval improvement in small bowel distension/partial obstruction   Electronically Signed   By: Daryll Brod M.D.   On: 08/20/13 09:11   Dg Abd Portable 2v  08/12/2013   CLINICAL DATA:  Small bowel obstruction, followup, abdominal pain  EXAM: PORTABLE ABDOMEN - 2 VIEW  COMPARISON:  CT abdomen pelvis of 08/12/2013  FINDINGS: Supine and left lower decubitus films of the abdomen show persistently dilated loops of small bowel with air-fluid levels consistent with a partial small  bowel obstruction. No definite colonic bowel gas is seen. Contrast is noted within the urinary bladder. No free air is seen on the left lateral decubitus of the abdomen. No opaque calculi are noted.  IMPRESSION: Persistent partial small bowel obstruction.  No free air.   Electronically Signed   By: Ivar Drape M.D.   On: 08/12/2013 14:16    Anti-infectives: Anti-infectives   None      Assessment/Plan: SBO  Looks clinically worse on clears than Friday.  Although he is having loose bms. Will check plain films today and then reassess  [facility] 08/14/2013

## 2013-08-15 ENCOUNTER — Inpatient Hospital Stay (HOSPITAL_COMMUNITY): Payer: Medicare Other

## 2013-08-15 NOTE — Progress Notes (Signed)
  Subjective: Just got back from fluoro. +flatus yesterday. No n/v. Still feels a little bloated  Objective: Vital signs in last 24 hours: Temp:  [97.5 F (36.4 C)-97.8 F (36.6 C)] 97.5 F (36.4 C) (02/23 0531) Pulse Rate:  [65-77] 68 (02/23 0531) Resp:  [16-20] 20 (02/23 0531) BP: (115-134)/(58-74) 127/58 mmHg (02/23 0531) SpO2:  [99 %-100 %] 99 % (02/23 0531) Last BM Date: 08/14/13  Intake/Output from previous day: 02/22 0701 - 02/23 0700 In: 1350 [I.V.:1350] Out: -  Intake/Output this shift:    Alert, nad Soft, mild distension. TTP in upper abd. No RT/guarding.   Lab Results:   Recent Labs  08/13/13 0453 08/14/13 0635  WBC 4.6 4.2  HGB 12.2* 12.0*  HCT 36.0* 34.5*  PLT 163 141*   BMET  Recent Labs  08/13/13 0453 08/14/13 0635  NA 141 138  K 4.1 4.0  CL 103 105  CO2 25 25  GLUCOSE 93 103*  BUN 8 7  CREATININE 0.92 0.85  CALCIUM 9.6 9.0   PT/INR No results found for this basename: LABPROT, INR,  in the last 72 hours ABG No results found for this basename: PHART, PCO2, PO2, HCO3,  in the last 72 hours  Studies/Results: Dg Abd 1 View  08/15/2013   CLINICAL DATA:  Status post nasogastric tube placement  EXAM: ABDOMEN - 1 VIEW  COMPARISON:  DG ABD 2 VIEWS dated 08/14/2013  FINDINGS: A single fluoro spot film reveals the presence of a nasogastric tube in the left upper quadrant of the abdomen. The proximal port lies approximately 2 cm below the expected location of the GE junction. The distal port lies in the proximal gastric fundus.  IMPRESSION: There has been interval placement of a nasogastric tube. Advancement by 10 cm is recommended to assure that the proximal port remains below the level of the GE junction.   Electronically Signed   By: David  [place]   On: 08/15/2013 09:56   Dg Abd 2 Views  08/14/2013   CLINICAL DATA:  Three day history of left-sided abdominal pain. Diarrhea. Follow-up small bowel obstruction.  EXAM: ABDOMEN - 2 VIEW  COMPARISON:  DG  ABD 2 VIEWS dated 08/13/2013; DG ABD PORTABLE 2V dated 08/12/2013; CT ABD/PELVIS W CM dated 08/12/2013; DG ABD PORTABLE 2V dated 01/20/2013  FINDINGS: Since yesterday's examination, interval increase in the caliber of many of the dilated loops of small bowel throughout the abdomen, though there are fewer gas-filled loops. Air-fluid levels are again noted on the erect image. No evidence of free air. Gas within normal caliber colon, shown on the prior CT to all be situated in the right side of the abdomen.  IMPRESSION: Interval worsening of the partial small bowel obstruction since yesterday. No free intraperitoneal air.   Electronically Signed   By: Evangeline Dakin M.D.   On: 08/14/2013 15:18    Anti-infectives: Anti-infectives   None      Assessment/Plan: pSBO  Connect NG to LIWS; adv NG 5cm Npo except ice chips Repeat films in am, labs  Leighton Ruff. Redmond Pulling, MD, FACS General, Bariatric, & Minimally Invasive Surgery [facility], [place]   LOS: 3 days    Gayland Curry 08/15/2013

## 2013-08-16 ENCOUNTER — Inpatient Hospital Stay (HOSPITAL_COMMUNITY): Payer: Medicare Other

## 2013-08-16 LAB — BASIC METABOLIC PANEL
BUN: 10 mg/dL (ref 6–23)
CO2: 26 mEq/L (ref 19–32)
Calcium: 8.8 mg/dL (ref 8.4–10.5)
Chloride: 108 mEq/L (ref 96–112)
Creatinine, Ser: 0.99 mg/dL (ref 0.50–1.35)
GFR, EST NON AFRICAN AMERICAN: 81 mL/min — AB (ref >90)
Glucose, Bld: 74 mg/dL (ref 70–99)
Potassium: 4.1 mEq/L (ref 3.7–5.3)
Sodium: 145 mEq/L (ref 137–147)

## 2013-08-16 LAB — MAGNESIUM: MAGNESIUM: 1.9 mg/dL (ref 1.5–2.5)

## 2013-08-16 NOTE — Progress Notes (Signed)
Patient ID: Kent [name], male   DOB: 28-Dec-1940, 73 y.o.   MRN: [id number]    Subjective: Pt feels ok today.  Passing some flatus.  Had about 1L out once NGT finally placed yesterday.  Objective: Vital signs in last 24 hours: Temp:  [98.1 F (36.7 C)-98.2 F (36.8 C)] 98.1 F (36.7 C) (02/24 0556) Pulse Rate:  [72-78] 75 (02/24 0556) Resp:  [16-17] 16 (02/24 0556) BP: (117-135)/(61-70) 117/69 mmHg (02/24 0556) SpO2:  [97 %-99 %] 99 % (02/24 0556) Last BM Date: 08/14/13  Intake/Output from previous day: 02/23 0701 - 02/24 0700 In: 1125 [I.V.:1125] Out: 2570 [Urine:1620; Emesis/NG output:950] Intake/Output this shift:    PE: Abd: much softer, less distended, still with some bilious NGT output, +BS, NT Heart: regular  Lab Results:   Recent Labs  08/14/13 0635  WBC 4.2  HGB 12.0*  HCT 34.5*  PLT 141*   BMET  Recent Labs  08/14/13 0635 08/16/13 0526  NA 138 145  K 4.0 4.1  CL 105 108  CO2 25 26  GLUCOSE 103* 74  BUN 7 10  CREATININE 0.85 0.99  CALCIUM 9.0 8.8   PT/INR No results found for this basename: LABPROT, INR,  in the last 72 hours CMP     Component Value Date/Time   NA 145 08/16/2013 0526   K 4.1 08/16/2013 0526   CL 108 08/16/2013 0526   CO2 26 08/16/2013 0526   GLUCOSE 74 08/16/2013 0526   BUN 10 08/16/2013 0526   CREATININE 0.99 08/16/2013 0526   CALCIUM 8.8 08/16/2013 0526   PROT 7.4 08/12/2013 0926   ALBUMIN 4.0 08/12/2013 0926   AST 18 08/12/2013 0926   ALT 17 08/12/2013 0926   ALKPHOS 79 08/12/2013 0926   BILITOT 0.4 08/12/2013 0926   GFRNONAA 81* 08/16/2013 0526   GFRAA >90 08/16/2013 0526   Lipase     Component Value Date/Time   LIPASE 17 07/03/2009 1311       Studies/Results: Dg Abd 1 View  08/15/2013   CLINICAL DATA:  Status post nasogastric tube placement  EXAM: ABDOMEN - 1 VIEW  COMPARISON:  DG ABD 2 VIEWS dated 08/14/2013  FINDINGS: A single fluoro spot film reveals the presence of a nasogastric tube in the left upper quadrant of  the abdomen. The proximal port lies approximately 2 cm below the expected location of the GE junction. The distal port lies in the proximal gastric fundus.  IMPRESSION: There has been interval placement of a nasogastric tube. Advancement by 10 cm is recommended to assure that the proximal port remains below the level of the GE junction.   Electronically Signed   By: David  [place]   On: 08/15/2013 09:56   Dg Abd 2 Views  08/16/2013   CLINICAL DATA:  Abdominal pain and distention. Evaluate nasogastric tube position.  EXAM: ABDOMEN - 2 VIEW  COMPARISON:  Abdominal radiograph 08/14/2013  FINDINGS: Nasogastric tube is satisfactorily positioned within the stomach. Gaseous distention of bowel loops has decreased since the abdominal radiographs of 08/14/2013. There is a persistently dilated bowel loop in the right upper quadrant, and is unsure if this is colon or small bowel.  Negative for free intraperitoneal air.  The lung bases are clear.  IMPRESSION: 1. Satisfactory position of nasogastric tube. 2. Improving bowel gas pattern (decreasing bowel dilatation), compared to abdominal radiographs 08/14/2013.   Electronically Signed   By: Britta Mccreedy M.D.   On: 08/16/2013 08:04   Dg Abd 2 Views  08/14/2013   CLINICAL DATA:  Three day history of left-sided abdominal pain. Diarrhea. Follow-up small bowel obstruction.  EXAM: ABDOMEN - 2 VIEW  COMPARISON:  DG ABD 2 VIEWS dated 08/13/2013; DG ABD PORTABLE 2V dated 08/12/2013; CT ABD/PELVIS W CM dated 08/12/2013; DG ABD PORTABLE 2V dated 01/20/2013  FINDINGS: Since yesterday's examination, interval increase in the caliber of many of the dilated loops of small bowel throughout the abdomen, though there are fewer gas-filled loops. Air-fluid levels are again noted on the erect image. No evidence of free air. Gas within normal caliber colon, shown on the prior CT to all be situated in the right side of the abdomen.  IMPRESSION: Interval worsening of the partial small bowel  obstruction since yesterday. No free intraperitoneal air.   Electronically Signed   By: Hulan Saas M.D.   On: 08/14/2013 15:18   Dg Basil Dess Tube Plc W/fl-no Rad  08/15/2013   CLINICAL DATA: NG placement d/t SBO   NASO G TUBE PLACEMENT WITH FLUORO  Fluoroscopy was utilized by the requesting physician.  No radiographic  interpretation.     Anti-infectives: Anti-infectives   None       Assessment/Plan  1. PSBO, s/p multiple abdominal surgeries  Plan: 1. X-rays show improvement in distention, but not completely resolved yet.  Will continue NGT management and recheck films in the morning.  If continues to improve, hopefully he can have this removed tomorrow.   LOS: 4 days    Lamarius Dirr E 08/16/2013, 11:01 AM Pager: [phone]

## 2013-08-16 NOTE — Progress Notes (Signed)
Patient continues to pass large amounts of flatus.  No BM, but the patient had a couple of days of diarrhea before presenting with his SBO.    His abdomen is quite soft with active bowel sounds. Will clamp NG tube over night.  Imogene Burn. [name], MD, [facility]  General/ Trauma Surgery  08/16/2013 3:17 PM

## 2013-08-17 ENCOUNTER — Inpatient Hospital Stay (HOSPITAL_COMMUNITY): Payer: Medicare Other

## 2013-08-17 MED ORDER — METHOCARBAMOL 500 MG PO TABS
1000.0000 mg | ORAL_TABLET | Freq: Two times a day (BID) | ORAL | Status: DC
  Administered 2013-08-17 – 2013-08-20 (×7): 1000 mg via ORAL
  Filled 2013-08-17 (×8): qty 2

## 2013-08-17 MED ORDER — PANTOPRAZOLE SODIUM 40 MG PO TBEC
40.0000 mg | DELAYED_RELEASE_TABLET | Freq: Every day | ORAL | Status: DC
  Administered 2013-08-17 – 2013-08-20 (×4): 40 mg via ORAL
  Filled 2013-08-17: qty 1

## 2013-08-17 MED ORDER — HYDROCODONE-ACETAMINOPHEN 5-325 MG PO TABS
1.0000 | ORAL_TABLET | Freq: Three times a day (TID) | ORAL | Status: DC | PRN
  Administered 2013-08-17 – 2013-08-19 (×5): 1 via ORAL
  Filled 2013-08-17 (×5): qty 1

## 2013-08-17 NOTE — Progress Notes (Signed)
Did well with clear liquids Encourage ambulation Will advance diet slowly.  Kent [name]. [name], MD, [facility]  General/ Trauma Surgery  08/17/2013 10:34 AM

## 2013-08-17 NOTE — Progress Notes (Signed)
  Subjective: He feels better passing flatus, no Bm.  He is ready to have NG pulled and try some clears.  Objective: Vital signs in last 24 hours: Temp:  [98 F (36.7 C)-98.5 F (36.9 C)] 98 F (36.7 C) 08/20/2022 0522) Pulse Rate:  [74-84] 80 20-Aug-2022 0522) Resp:  [16-18] 16 08/20/2022 0522) BP: (114-132)/(62-71) 132/71 mmHg 08/20/2022 0522) SpO2:  [95 %-99 %] 95 % 08/20/22 0522) Last BM Date: 08/14/13 NG clamped yesterday with nothing from NG recorded NPO No labs Film shows interim improvement of SB distension and colon gas pattern Intake/Output from previous day: 02/24 0701 - 20-Aug-2022 0700 In: 3075 [I.V.:3075] Out: 1150 [Urine:1150] Intake/Output this shift:    General appearance: alert, cooperative and no distress Resp: clear to auscultation bilaterally GI: soft, non-tender; bowel sounds normal; no masses,  no organomegaly  Lab Results:  No results found for this basename: WBC, HGB, HCT, PLT,  in the last 72 hours  BMET  Recent Labs  08/16/13 0526  NA 145  K 4.1  CL 108  CO2 26  GLUCOSE 74  BUN 10  CREATININE 0.99  CALCIUM 8.8   PT/INR No results found for this basename: LABPROT, INR,  in the last 72 hours   Recent Labs Lab 08/12/13 0926  AST 18  ALT 17  ALKPHOS 79  BILITOT 0.4  PROT 7.4  ALBUMIN 4.0     Lipase     Component Value Date/Time   LIPASE 17 07/03/2009 1311     Studies/Results: Dg Abd 2 Views  08-20-2013   CLINICAL DATA:  Small-bowel obstruction.  EXAM: ABDOMEN - 2 VIEW  COMPARISON:  DG ABD 2 VIEWS dated 08/16/2013; DG ABD 1 VIEW dated 08/15/2013; CT ABD/PELVIS W CM dated 08/12/2013  FINDINGS: NG tube is noted in stable position. Interim improvement in small bowel distention. Colonic gas pattern is nonspecific. No free air noted. Calcifications in the pelvis consistent with phleboliths.  IMPRESSION: 1. NG tube in stable position. 2. Interim slight improvement in small bowel distention. Colonic gas pattern is normal. No free air.   Electronically Signed    By: Marcello Moores  Register   On: 08/20/2013 08:17   Dg Abd 2 Views  08/16/2013   CLINICAL DATA:  Abdominal pain and distention. Evaluate nasogastric tube position.  EXAM: ABDOMEN - 2 VIEW  COMPARISON:  Abdominal radiograph 08/14/2013  FINDINGS: Nasogastric tube is satisfactorily positioned within the stomach. Gaseous distention of bowel loops has decreased since the abdominal radiographs of 08/14/2013. There is a persistently dilated bowel loop in the right upper quadrant, and is unsure if this is colon or small bowel.  Negative for free intraperitoneal air.  The lung bases are clear.  IMPRESSION: 1. Satisfactory position of nasogastric tube. 2. Improving bowel gas pattern (decreasing bowel dilatation), compared to abdominal radiographs 08/14/2013.   Electronically Signed   By: Curlene Dolphin M.D.   On: 08/16/2013 08:04    Medications: . enoxaparin (LOVENOX) injection  40 mg Subcutaneous Q24H  . pantoprazole (PROTONIX) IV  40 mg Intravenous QHS    Assessment/Plan . 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 Change PPI to PO and PO pain meds for back  LOS: 5 days    Lavanya Roa 08-20-2013

## 2013-08-18 ENCOUNTER — Encounter (HOSPITAL_COMMUNITY): Payer: Self-pay | Admitting: General Surgery

## 2013-08-18 DIAGNOSIS — K219 Gastro-esophageal reflux disease without esophagitis: Secondary | ICD-10-CM | POA: Diagnosis present

## 2013-08-18 DIAGNOSIS — R197 Diarrhea, unspecified: Secondary | ICD-10-CM

## 2013-08-18 DIAGNOSIS — M549 Dorsalgia, unspecified: Secondary | ICD-10-CM

## 2013-08-18 DIAGNOSIS — J329 Chronic sinusitis, unspecified: Secondary | ICD-10-CM | POA: Diagnosis present

## 2013-08-18 DIAGNOSIS — G8929 Other chronic pain: Secondary | ICD-10-CM

## 2013-08-18 DIAGNOSIS — Z9889 Other specified postprocedural states: Secondary | ICD-10-CM

## 2013-08-18 HISTORY — DX: Gastro-esophageal reflux disease without esophagitis: K21.9

## 2013-08-18 LAB — CLOSTRIDIUM DIFFICILE BY PCR: Toxigenic C. Difficile by PCR: NEGATIVE

## 2013-08-18 MED ORDER — SACCHAROMYCES BOULARDII 250 MG PO CAPS
250.0000 mg | ORAL_CAPSULE | Freq: Two times a day (BID) | ORAL | Status: DC
  Administered 2013-08-18 – 2013-08-20 (×5): 250 mg via ORAL
  Filled 2013-08-18 (×6): qty 1

## 2013-08-18 MED ORDER — PSYLLIUM 95 % PO PACK
1.0000 | PACK | Freq: Every day | ORAL | Status: DC
  Administered 2013-08-18 – 2013-08-20 (×3): 1 via ORAL
  Filled 2013-08-18 (×3): qty 1

## 2013-08-18 NOTE — Progress Notes (Signed)
Imogene Burn. [name], MD, [facility]  General/ Trauma Surgery  08/18/2013 1:28 PM

## 2013-08-18 NOTE — Progress Notes (Signed)
C diff PCR is negative. He is on a soft diet.

## 2013-08-18 NOTE — Progress Notes (Signed)
  Subjective: Pt doing well.  Having lots of loose BM's and flatus.  Abdominal pain improved.  Tolerating clears, but wants more.  Ambulating well.    Objective: Vital signs in last 24 hours: Temp:  [97.9 F (36.6 C)-98 F (36.7 C)] 97.9 F (36.6 C) (02/26 0552) Pulse Rate:  [68-81] 81 (02/26 0552) Resp:  [18] 18 (02/26 0552) BP: (132-141)/(65-80) 141/80 mmHg (02/26 0552) SpO2:  [99 %-100 %] 100 % (02/26 0552) Last BM Date: 08-30-13  Intake/Output from previous day: 2022-08-30 0701 - 02/26 0700 In: 1390 [P.O.:240; I.V.:1150] Out: 550 [Urine:550] Intake/Output this shift:    PE: Gen:  Alert, NAD, pleasant Abd: Soft, mild tenderness in lower abdomen, ND, +BS, abdominal scars noted   Lab Results:  No results found for this basename: WBC, HGB, HCT, PLT,  in the last 72 hours BMET  Recent Labs  08/16/13 0526  NA 145  K 4.1  CL 108  CO2 26  GLUCOSE 74  BUN 10  CREATININE 0.99  CALCIUM 8.8   PT/INR No results found for this basename: LABPROT, INR,  in the last 72 hours CMP     Component Value Date/Time   NA 145 08/16/2013 0526   K 4.1 08/16/2013 0526   CL 108 08/16/2013 0526   CO2 26 08/16/2013 0526   GLUCOSE 74 08/16/2013 0526   BUN 10 08/16/2013 0526   CREATININE 0.99 08/16/2013 0526   CALCIUM 8.8 08/16/2013 0526   PROT 7.4 08/12/2013 0926   ALBUMIN 4.0 08/12/2013 0926   AST 18 08/12/2013 0926   ALT 17 08/12/2013 0926   ALKPHOS 79 08/12/2013 0926   BILITOT 0.4 08/12/2013 0926   GFRNONAA 81* 08/16/2013 0526   GFRAA >90 08/16/2013 0526   Lipase     Component Value Date/Time   LIPASE 17 07/03/2009 1311       Studies/Results: Dg Abd 2 Views  08-30-13   CLINICAL DATA:  Small-bowel obstruction.  EXAM: ABDOMEN - 2 VIEW  COMPARISON:  DG ABD 2 VIEWS dated 08/16/2013; DG ABD 1 VIEW dated 08/15/2013; CT ABD/PELVIS W CM dated 08/12/2013  FINDINGS: NG tube is noted in stable position. Interim improvement in small bowel distention. Colonic gas pattern is nonspecific. No free air  noted. Calcifications in the pelvis consistent with phleboliths.  IMPRESSION: 1. NG tube in stable position. 2. Interim slight improvement in small bowel distention. Colonic gas pattern is normal. No free air.   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 cdiff Chronic back pain  GERD   Plan:  1.  Advance to full liquids, soft in am if tolerating 2.  Ambulate, IS 3.  SCD's and lovenox 4.  Hopefully home tomorrow if tolerating soft diet 5.  Will need good bowel regimen at home 6.  Check for C.diff given diarrhea and his recent outpatient use of antibiotics for a sinus infection 7.  Metamucil and probiotic, would not give imodium at this point.    LOS: 6 days    Coralie Keens 08/18/2013, 8:53 AM Pager: [phone]

## 2013-08-18 NOTE — Progress Notes (Signed)
Multiple episodes of diarrhea Minimal abdominal tenderness  R/o c. Diff Advance diet  Imogene Burn. [name], MD, [facility]  General/ Trauma Surgery  08/18/2013 9:56 AM

## 2013-08-19 ENCOUNTER — Inpatient Hospital Stay (HOSPITAL_COMMUNITY): Payer: Medicare Other

## 2013-08-19 LAB — BASIC METABOLIC PANEL
BUN: 7 mg/dL (ref 6–23)
CALCIUM: 9 mg/dL (ref 8.4–10.5)
CO2: 27 mEq/L (ref 19–32)
Chloride: 105 mEq/L (ref 96–112)
Creatinine, Ser: 0.82 mg/dL (ref 0.50–1.35)
GFR calc Af Amer: >90 mL/min (ref >90)
GFR calc non Af Amer: 88 mL/min — ABNORMAL LOW (ref >90)
GLUCOSE: 122 mg/dL — AB (ref 70–99)
Potassium: 4.1 mEq/L (ref 3.7–5.3)
Sodium: 142 mEq/L (ref 137–147)

## 2013-08-19 LAB — CBC
HEMATOCRIT: 34 % — AB (ref 39.0–52.0)
HEMOGLOBIN: 11.9 g/dL — AB (ref 13.0–17.0)
MCH: 30.2 pg (ref 26.0–34.0)
MCHC: 35 g/dL (ref 30.0–36.0)
MCV: 86.3 fL (ref 78.0–100.0)
Platelets: 170 10*3/uL (ref 150–400)
RBC: 3.94 MIL/uL — ABNORMAL LOW (ref 4.22–5.81)
RDW: 13.4 % (ref 11.5–15.5)
WBC: 3.8 10*3/uL — ABNORMAL LOW (ref 4.0–10.5)

## 2013-08-19 MED ORDER — METOCLOPRAMIDE HCL 5 MG PO TABS: 5.0000 mg | ORAL_TABLET | Freq: Three times a day (TID) | ORAL | Status: DC

## 2013-08-19 MED ORDER — METOCLOPRAMIDE HCL 5 MG PO TABS
5.0000 mg | ORAL_TABLET | Freq: Three times a day (TID) | ORAL | Status: DC
  Administered 2013-08-19 – 2013-08-20 (×2): 5 mg via ORAL
  Filled 2013-08-19 (×5): qty 1

## 2013-08-19 NOTE — Progress Notes (Signed)
  Subjective: Pt states he is feeling better overall but continues to complain of lower midline abdominal tenderness as well as feeling bloated.  Pt states he is able to tolerate soft foods well, and is regaining his appetite.  Pt states he has continued to pass loose stools (which is fewer than yesterday) and flatus.  He is able to get up and walk around regularly and without difficulty.  Objective: Vital signs in last 24 hours: Temp:  [97.7 F (36.5 C)-98.3 F (36.8 C)] 97.8 F (36.6 C) (02/27 0545) Pulse Rate:  [77-93] 93 (02/27 0545) Resp:  [18] 18 (02/27 0545) BP: (112-147)/(59-82) 120/63 mmHg (02/27 0545) SpO2:  [98 %-100 %] 100 % (02/27 0545) Last BM Date: 08/18/13  Intake/Output from previous day: 02/26 0701 - 02/27 0700 In: 880 [P.O.:480; I.V.:400] Out: 200 [Urine:200] Intake/Output this shift:    PE: General:  Alert, NAD, pleasant, comfortable Cor: RRR. No MGR heard. Normal S1 S2 Lungs: CTA bilaterally. Normal Effort. Non tender. Abd:  Soft, mild distension, tympanic, mild tenderness to midline. No organomegaly.  Lab Results:   Recent Labs  08/19/13 0025  WBC 3.8*  HGB 11.9*  HCT 34.0*  PLT 170   BMET  Recent Labs  08/19/13 0025  NA 142  K 4.1  CL 105  CO2 27  GLUCOSE 122*  BUN 7  CREATININE 0.82  CALCIUM 9.0   PT/INR No results found for this basename: LABPROT, INR,  in the last 72 hours CMP     Component Value Date/Time   NA 142 08/19/2013 0025   K 4.1 08/19/2013 0025   CL 105 08/19/2013 0025   CO2 27 08/19/2013 0025   GLUCOSE 122* 08/19/2013 0025   BUN 7 08/19/2013 0025   CREATININE 0.82 08/19/2013 0025   CALCIUM 9.0 08/19/2013 0025   PROT 7.4 08/12/2013 0926   ALBUMIN 4.0 08/12/2013 0926   AST 18 08/12/2013 0926   ALT 17 08/12/2013 0926   ALKPHOS 79 08/12/2013 0926   BILITOT 0.4 08/12/2013 0926   GFRNONAA 88* 08/19/2013 0025   GFRAA >90 08/19/2013 0025   Lipase     Component Value Date/Time   LIPASE 17 07/03/2009 1311        Studies/Results: No results found.  Anti-infectives: Anti-infectives   None       Assessment/Plan PSBO, s/p multiple abdominal surgeries (admit 2/20)  Hx of multiple SBO's with 4 laparotomies Diarrhea Chronic back pain  GERD  H/o constipation  Plan:  1. Soft diet. Continue soft, low residue diet for next 1-2 weeks once discharged 2. Ambulate, IS  3. SCD's and lovenox  4. Hopefully home today, but will order abdominal film to ensure he isn't obstructed, ?SBFT 5. Will need good bowel regimen at home (miralax, colace, enemas/suppositiores prn, high water intake) 6. C-Diff negative. Noted on 08/18/13 7. Metamucil and probiotic, would not give imodium at this point.    LOS: 7 days    Legrand Como A. [place], [place] 08/19/2013, [facility] Phone #: [phone]  ----------------------------------------------------------------------------------------------------------- General Surgery PA Preceptor Note:  I agree with the above PA students findings as above.  Made changes as above.  Coralie Keens, PA-C General Surgery [facility] Pager: [phone] Office: [phone]

## 2013-08-19 NOTE — Progress Notes (Signed)
IV site due to be changed per protocol but pt refused restart, pt stated he is going home in am.

## 2013-08-19 NOTE — Discharge Summary (Signed)
Physician Discharge Summary  Patient ID: Kent [name] MRN: [id number] DOB/AGE: 1940/12/13 72 y.o.  Admit date: 08/12/2013 Discharge date: 08/20/2013  Admitting Diagnosis: PSBO, s/p multiple abdominal surgeries. Last surgery 01/20/13 Dr.Thompson  Hx of multiple SBO's with at least 3 prior bowel surgeries  Chronic back pain  GERD  Sinus infection  Discharge Diagnosis:   PSBO, s/p multiple abdominal surgeries. Last surgery 01/20/13 Dr.Thompson  Hx of multiple SBO's with at least 3 prior bowel surgeries  Chronic back pain  GERD  Sinus infection   Consultants None  Imaging:   SB follow thru:   No evidence of stricture or obstruction within the imaged portion of  small bowel, however contrast has not yet clearly reached the  anastomosis with residual colon.  Contrast will be followed with serial radiographs. If necessary, the  patient could be brought back to the fluoroscopy suite for spot  images of the anastomosis once contrast has reached the colon.  Alternatively, a barium enema could be considered in the future for  further evaluation of the anastomosis.   Procedures None  Hospital Course:  73 y/o white male with PMH multiple abdominal surgeries including subtotal colectomy ileostomy, ileostomy takedown, Lap with LOA for SBO, and Lap for incisional hernia repair and LOA presents to [facility] on 08/12/13 with diffuse abdominal pain. His 2 most recent surgeries were done by Dr. Georganna Skeans. The pain started at 11pm on 08/11/13 which was described as "excruciating cramping" with nausea, but no vomiting. It progressively worsened which brought him to the ER for further evaluation. He states the pain is identical to previous episodes of small bowel obstruction. He reported no flatus, and that his last BM was 5pm the previous evening. No alleviating or precipitating factors. No radiating pain. He complains of chronic constipation for which he has not started a bowel regimen for currently.  No fevers/chills, SOB/CP, urinary symptoms. Was just on Levaquin for a sinus infection.  He was seen by Dr. Donne Hazel and admitted. An NG was place with great difficulty secondary to sinusitis, he required IR NG placement. His symptoms improved and pt underwent successful clamping trials with subsequent removal of NG tube and advancing of diet. He was having a good deal of diarrhea after bowel function returned but C diff was negative.  On 08/19/13, Pt was noted to have abdominal bloating so received an abdominal Xray and a small bowel follow-through series.   This shows:  No evidence of stricture or obstruction within the imaged portion of  small bowel, however contrast has not yet clearly reached the  anastomosis with residual colon. He was seen by Dr. [name] and it was his opinion he was ready for discharge .   Pt is able to be discharged to home today with caveat of continuing soft, low-residue foods for the next 1-2 weeks.    Physical Exam: Filed Vitals:   08/20/13 0554  BP: 138/82  Pulse: 73  Temp: 97.9 F (36.6 C)  Resp: 17       Medication List    STOP taking these medications       levofloxacin 500 MG tablet  Commonly known as:  LEVAQUIN      TAKE these medications       GAS-X PO  Take 2 tablets by mouth every 6 (six) hours as needed (gas).     HYDROcodone-acetaminophen 5-325 MG per tablet  Commonly known as:  NORCO/VICODIN  Take 1 tablet by mouth every 8 (eight) hours as needed for  pain.     methocarbamol 500 MG tablet  Commonly known as:  ROBAXIN  Take 1,000 mg by mouth 2 (two) times daily.     omeprazole 40 MG capsule  Commonly known as:  PRILOSEC  Take 40 mg by mouth daily.     oxyCODONE 20 MG 12 hr tablet  Commonly known as:  OXYCONTIN  Take 20 mg by mouth every 12 (twelve) hours.     psyllium 95 % Pack  Commonly known as:  HYDROCIL/METAMUCIL  Take 1 packet by mouth daily.     saccharomyces boulardii 250 MG capsule  Commonly known as:  FLORASTOR  Take 1  capsule (250 mg total) by mouth 2 (two) times daily.         Follow-up Information   Schedule an appointment as soon as possible for a visit with Shirline Frees, MD. (Follow up for medical issues.)    Specialty:  Family Medicine   Contact information:   [address] [place] [address] [phone]       Call Zenovia Jarred, MD. (As needed)    Specialty:  General Surgery   Contact information:   [place] [address] [phone]       Signed: Barrington Ellison, PA-S  [facility] [phone]  08/20/2013, 11:12 AM

## 2013-08-19 NOTE — Progress Notes (Signed)
Patient still mildly distended with crampy abdominal pain.  Plain films - no obstruction.   Dg Abd 2 Views  08/19/2013   CLINICAL DATA:  Small bowel obstruction  EXAM: ABDOMEN - 2 VIEW  COMPARISON:  08/17/2013  FINDINGS: Unremarkable bowel gas pattern. No findings for persistent small bowel obstruction. The soft tissue shadows are maintained. No free air. The bony structures are unremarkable. The lung bases are clear.  IMPRESSION: Removal of NG tube.  Resolution of small bowel obstruction.   Electronically Signed   By: Kalman Jewels M.D.   On: 08/19/2013 11:10   I have concerns that he may have a stricture or an area of chronic partial obstruction.  Will obtain UGI with SBFT.  Kent [name]. [name], MD, [facility]  General/ Trauma Surgery  08/19/2013 11:31 AM

## 2013-08-20 MED ORDER — PSYLLIUM 95 % PO PACK: 1.0000 | PACK | Freq: Every day | ORAL | Status: DC

## 2013-08-20 MED ORDER — SACCHAROMYCES BOULARDII 250 MG PO CAPS: 250.0000 mg | ORAL_CAPSULE | Freq: Two times a day (BID) | ORAL | Status: DC

## 2013-08-20 NOTE — Progress Notes (Signed)
Subjective: SBFT showed no sign of obstruction. Patient has passed contrast.  Objective: Vital signs in last 24 hours: Temp:  [97.6 F (36.4 C)-98 F (36.7 C)] 97.9 F (36.6 C) (02/28 0554) Pulse Rate:  [69-73] 73 (02/28 0554) Resp:  [17-18] 17 (02/28 0554) BP: (138-145)/(72-82) 138/82 mmHg (02/28 0554) SpO2:  [98 %-100 %] 98 % (02/28 0554) Last BM Date: 2013/09/01  Intake/Output from previous day: 09-01-22 0701 - 02/28 0700 In: 1250 [I.V.:1250] Out: -  Intake/Output this shift:    General appearance: alert, cooperative and no distress GI: soft, minimal tenderness; + BS  Lab Results:   Recent Labs  2013/09/01 0025  WBC 3.8*  HGB 11.9*  HCT 34.0*  PLT 170   BMET  Recent Labs  09/01/2013 0025  NA 142  K 4.1  CL 105  CO2 27  GLUCOSE 122*  BUN 7  CREATININE 0.82  CALCIUM 9.0   PT/INR No results found for this basename: LABPROT, INR,  in the last 72 hours ABG No results found for this basename: PHART, PCO2, PO2, HCO3,  in the last 72 hours  Studies/Results: Dg Small Bowel  Sep 01, 2013   CLINICAL DATA:  Abdominal pain. History of subtotal colectomy, ileostomy, and ileostomy takedown. History of prior bowel obstructions. Evaluate for possible stricture at the bowel anastomosis.  EXAM: SMALL BOWEL SERIES  TECHNIQUE: Following ingestion of thin barium, serial small bowel images were obtained including spot views of the terminal ileum.  COMPARISON:  Abdominal radiographs Sep 01, 2013 and earlier. CT abdomen and pelvis 08/12/2013.  FLUOROSCOPY TIME:  4 min 55 seconds  FINDINGS: Immediate overhead radiograph demonstrates oral contrast within a normally positioned stomach and proximal small bowel. No dilated loops of bowel are seen.  Multiple overhead radiographs were obtained at 20-35 min intervals out to 2 hr and 10 min. At that point, contrast had still not clearly reached large bowel. Multiple spot compression views were obtained at this point. No stricture was identified.  Towards the end of filming, contrast appeared to be progressing into more distal small bowel with possibly some fecalized contents, however the small to large bowel anastomosis still was not definitely identified.  IMPRESSION: No evidence of stricture or obstruction within the imaged portion of small bowel, however contrast has not yet clearly reached the anastomosis with residual colon.  Contrast will be followed with serial radiographs. If necessary, the patient could be brought back to the fluoroscopy suite for spot images of the anastomosis once contrast has reached the colon. Alternatively, a barium enema could be considered in the future for further evaluation of the anastomosis.   Electronically Signed   By: Logan Bores   On: Sep 01, 2013 17:16   Dg Abd 2 Views  01-Sep-2013   CLINICAL DATA:  Small bowel obstruction  EXAM: ABDOMEN - 2 VIEW  COMPARISON:  08/17/2013  FINDINGS: Unremarkable bowel gas pattern. No findings for persistent small bowel obstruction. The soft tissue shadows are maintained. No free air. The bony structures are unremarkable. The lung bases are clear.  IMPRESSION: Removal of NG tube.  Resolution of small bowel obstruction.   Electronically Signed   By: Kalman Jewels M.D.   On: September 01, 2013 11:10   Dg Abd Portable 1v  Sep 01, 2013   CLINICAL DATA:  Abdominal pain, previous subtotal colectomy, ileostomy, ileostomy takedown.  EXAM: PORTABLE ABDOMEN - 1 VIEW  COMPARISON:  Small bowel follow-through from earlier the same day  FINDINGS: Contrast has advanced through the rectum. There is residual contrast in mid and distal  small bowel loops. No dilated loops of bowel are evident.  IMPRESSION: No evidence of small bowel obstruction. Contrast to the nondilated rectum.   Electronically Signed   By: Arne Cleveland M.D.   On: 08/19/2013 18:23    Anti-infectives: Anti-infectives   None      Assessment/Plan: PSBO - resolved Discharge  LOS: 8 days    Natividad Halls K. 08/20/2013

## 2013-08-20 NOTE — Discharge Instructions (Signed)
Low-Fiber Diet °Fiber is found in fruits, vegetables, and grains. A low-fiber diet restricts fibrous foods that are not digested in the small intestine. A diet containing about 10 grams of fiber is considered low fiber.  °PURPOSE °· To prevent blockage of a partially obstructed or narrowed gastrointestinal tract. °· To reduce fecal weight and volume. °· To slow the movement of feces. °WHEN IS THIS DIET USED? °· It may be used during the acute phase of Crohn disease, ulcerative colitis, regional enteritis, or diverticulitis. °· It may be used if your intestinal or esophageal tubes are narrowing (stenosis). °· It may be used as a transitional diet following surgery, injury (trauma), or illness. °CHOOSING FOODS °Check labels, especially on foods from the starch list. Often times, dietary fiber content is listed on the nutrition facts panel. Please ask your Registered Dietitian if you have questions about specific foods that are related to your condition, especially if the food is not listed on this handout. °Breads and Starches °· Allowed: White, French, and pita breads, plain rolls, buns, or sweet rolls, doughnuts, waffles, pancakes, bagels. Plain muffins, biscuits, matzoth. Soda, saltine, graham crackers. Pretzels, rusks, melba toast, zwieback. Cooked cereals: cornmeal, farina, or cream cereals. Dry cereals: refined corn, wheat, rice, and oat cereals (check label). Potatoes prepared any way without skins, refined macaroni, spaghetti, noodles, refined rice. °· Avoid: Whole-wheat bread, rolls, and crackers. Multigrains, rye, bran seeds, nuts, or coconut. Cereals containing whole grains, multigrains, bran, coconut, nuts, raisins. Cooked or dry oatmeal. Coarse wheat cereals, granola. Cereals advertised as "high fiber." Potato skins. Whole-grain pasta, wild or brown rice. Popcorn. °Vegetables °· Allowed: Strained tomato and vegetable juices. Fresh lettuce, cucumber, spinach. Well-cooked or canned: asparagus, bean sprouts,  broccoli, cut green beans, cauliflower, pumpkin, beets, mushrooms, yellow squash, tomato, tomato sauce, zucchini, turnips. Keep servings limited to ½ cup. °· Avoid: Fresh, cooked, or canned: artichokes, baked beans, beet greens, Brussels sprouts, corn, kale, legumes, peas, sweet potatoes. Avoid large servings of any vegetables. °Fruit °· Allowed: All fruit juices except prune juice. Cooked or canned fruits without skin and seeds: apricots, applesauce, cantaloupe, cherries, grapefruit, grapes, kiwi, mandarin oranges, peaches, pears, fruit cocktail, pineapple, plums, watermelon. Fresh without skin: banana, grapes, cantaloupe, avocado, cherries, pineapple, kiwi, nectarines, peaches, blueberries. Keep servings limited to ½ cup or 1 piece. °· Avoid: Fresh: apples with or without skin, apricots, mangoes, pears, raspberries, strawberries. Prune juice and juices with pulp, stewed or dried prunes. Dried fruits, raisins, dates. Avoid large servings of all fresh fruits. °Meat and Protein Substitutes °· Allowed: Ground or well-cooked tender beef, ham, veal, lamb, pork, poultry. Eggs, plain cheese. Fish, oysters, shrimp, lobster, other seafood. Liver, organ meats. Smooth nut butters. °· Avoid: Tough, fibrous meats with gristle. Chunky nut butter. Cheese with seeds, nuts, or other foods not allowed. Nuts, seeds, legumes, dried peas, beans, lentils. °Dairy °· Allowed: All milk products except those not allowed. °· Avoid: Yogurt or cheese that contains nuts, seeds, or added fruit.  °Soups and Combination Foods °· Allowed: Bouillon, broth, or cream soups made from allowed foods. Any strained soup. Casseroles or mixed dishes made with allowed foods. °· Avoid: Soups made from vegetables that are not allowed or that contain other foods not allowed. °Desserts and Sweets °· Allowed: Plain cakes and cookies, pie made with allowed fruit, pudding, custard, cream pie. Gelatin, fruit, ice, sherbet, frozen ice pops. Ice cream, ice milk without  nuts. Plain hard candy, honey, jelly, molasses, syrup, sugar, chocolate syrup, gumdrops, marshmallows. °· Avoid: Desserts, cookies, or candies that contain   nuts, peanut butter, dried fruits. Jams, preserves with seeds, marmalade. Fats and Oils  Allowed:Margarine, butter, cream, mayonnaise, salad oils, plain salad dressings made from allowed foods.  Avoid: Seeds, nuts, olives. Beverages  Allowed: All, except those listed to avoid.  Avoid: Fruit juices with high pulp, prune juice. Condiments  Allowed:Ketchup, mustard, horseradish, vinegar, cream sauce, cheese sauce, cocoa powder. Spices in moderation: allspice, basil, bay leaves, celery powder or leaves, cinnamon, cumin powder, curry powder, ginger, mace, marjoram, onion or garlic powder, oregano, paprika, parsley flakes, ground pepper, rosemary, sage, savory, tarragon, thyme, turmeric.  Avoid: Coconut, pickles. SAMPLE MENU Breakfast   cup orange juice.  1 boiled egg.  1 slice white toast.  Margarine.   cup cornflakes.  1 cup milk.  Beverage. Lunch   cup chicken noodle soup.  2 to 3 oz sliced roast beef.  2 slices white bread.  Mayonnaise.   cup tomato juice.  1 small banana.  Beverage. Dinner  3 oz baked chicken.   cup scalloped potatoes.   cup cooked beets.  White dinner roll.  Margarine.   cup canned peaches.  Beverage. Document Released: 11/29/2001 Document Revised: 02/09/2013 Document Reviewed: 06/26/2011 [facility] Patient Information 2014 [place]. Small Bowel Obstruction A small bowel obstruction is a blockage (obstruction) of the small intestine (small bowel). The small bowel is a long, slender tube that connects the stomach to the colon. Its job is to absorb nutrients from the fluids and foods you consume into the bloodstream.  CAUSES  There are many causes of intestinal blockage. The most common ones include:  Hernias. This is a more common cause in children than  adults.  Inflammatory bowel disease (enteritis and colitis).  Twisting of the bowel (volvulus).  Tumors.  Scar tissue (adhesions) from previous surgery or radiation treatment.  Recent surgery. This may cause an acute small bowel obstruction called an ileus. SYMPTOMS   Abdominal pain. This may be dull cramps or sharp pain. It may occur in one area or may be present in the entire abdomen. Pain can range from mild to severe, depending on the degree of obstruction.  Nausea and vomiting. Vomit may be greenish or yellow bile color.  Distended or swollen stomach. Abdominal bloating is a common symptom.  Constipation.  Lack of passing gas.  Frequent belching.  Diarrhea. This may occur if runny stool is able to leak around the obstruction. DIAGNOSIS  Your caregiver can usually diagnose small bowel obstruction by taking a history, doing a physical exam, and taking X-rays. If the cause is unclear, a CT scan (computerized tomography) of your abdomen and pelvis may be needed. TREATMENT  Treatment of the blockage depends on the cause and how bad the problem is.   Sometimes, the obstruction improves with bed rest and intravenous (IV) fluids.  Resting the bowel is very important. This means following a simple diet. Sometimes, a clear liquid diet may be required for several days.  Sometimes, a small tube (nasogastric tube) is placed into the stomach to decompress the bowel. When the bowel is blocked, it usually swells up like a balloon filled with air and fluids. Decompression means that the air and fluids are removed by suction through that tube. This can help with pain, discomfort, and nausea. It can also help the obstruction resolve faster.  Surgery may be required if other treatments do not work. Bowel obstruction from a hernia may require early surgery and can be an emergency procedure. Adhesions that cause frequent or severe obstructions may also  require surgery. HOME CARE INSTRUCTIONS If  your bowel obstruction is only partial or incomplete, you may be allowed to go home.  Get plenty of rest.  Follow your diet as directed by your caregiver.  Only consume clear liquids until your condition improves.  Avoid solid foods as instructed. SEEK IMMEDIATE MEDICAL CARE IF:  You have increased pain or cramping.  You vomit blood.  You have uncontrolled vomiting or nausea.  You cannot drink fluids due to vomiting or pain.  You develop confusion.  You begin feeling very dry or thirsty (dehydrated).  You have severe bloating.  You have chills.  You have a fever.  You feel extremely weak or you faint. MAKE SURE YOU:  Understand these instructions.  Will watch your condition.  Will get help right away if you are not doing well or get worse. Document Released: 08/26/2005 Document Revised: 09/01/2011 Document Reviewed: 08/23/2010 [facility] Patient Information 2014 [place].

## 2013-08-20 NOTE — Progress Notes (Signed)
Discharge instructions gone over with patient. Home medications gone over. Low fiber discussed and printed information given to patient. Follow up appointment to be made. Reasons to call the doctor discussed. Patient verbalized understanding of instructions. No prescriptions needed at this time.

## 2013-08-23 NOTE — Discharge Summary (Signed)
Kent [name]. [name], MD, [facility]  General/ Trauma Surgery  08/23/2013 7:07 AM

## 2013-11-29 ENCOUNTER — Other Ambulatory Visit: Payer: Self-pay | Admitting: Orthopaedic Surgery

## 2013-11-29 DIAGNOSIS — M545 Low back pain, unspecified: Secondary | ICD-10-CM

## 2013-12-03 ENCOUNTER — Ambulatory Visit
Admission: RE | Admit: 2013-12-03 | Discharge: 2013-12-03 | Disposition: A | Discharge status: Home / Self Care | Payer: Medicare Other | Source: Ambulatory Visit | Attending: Orthopaedic Surgery | Admitting: Orthopaedic Surgery

## 2013-12-03 DIAGNOSIS — M545 Low back pain, unspecified: Secondary | ICD-10-CM

## 2014-01-13 ENCOUNTER — Ambulatory Visit (INDEPENDENT_AMBULATORY_CARE_PROVIDER_SITE_OTHER): Payer: Medicare Other | Admitting: General Surgery

## 2014-01-13 VITALS — BP 118/78 | HR 72 | Temp 97.2°F | Ht 70.0 in | Wt 181.0 lb

## 2014-01-13 DIAGNOSIS — K432 Incisional hernia without obstruction or gangrene: Secondary | ICD-10-CM

## 2014-01-13 NOTE — Progress Notes (Signed)
Subjective:     Patient ID: Kent [name], male   DOB: April 20, 1941, 73 y.o.   MRN: [id number]  HPI Patient is known to me status post exploratory laparotomy with lysis of adhesions and incisional hernia repair in 2014. His hernia was in the right lower quadrant from an appendectomy site. He was more recently hospitalized with partial small bowel obstruction in February of this year. This resolved without surgery. He is having daily bowel movements. He does have crampy abdominal pain at times. He felt his abdomen seems to be getting bigger. He also has some associated abdominal wall soreness.  Review of Systems     Objective:   Physical Exam  Constitutional: He is oriented to person, place, and time. He appears well-developed. No distress.  HENT:  Head: Normocephalic and atraumatic.  Neck: Normal range of motion.  Cardiovascular: Normal rate and normal heart sounds.   Pulmonary/Chest: Effort normal and breath sounds normal.  Abdominal: Soft.    Incisional hernia involves the upper half of his midline incision over approximately 10 x 4 cm. Spontaneously reduces when he lays down, bowel sounds are active, mild tenderness along the hernia edge but no generalized tenderness, old right lower quadrant hernia repair is intact.  Neurological: He is alert and oriented to person, place, and time.  Skin: Skin is warm and dry.  Psychiatric: He has a normal mood and affect.       Assessment:     Incisional hernia, no evidence of obstruction    Plan:     Patient has an upper midline incisional hernia. It spontaneously reduces. If he becomes more symptomatic, this may require repair. Does not require urgent repair at this time. I would like to see him back in 2 months. If things are worsening we will discuss surgery further. I did begin discussions of the laparoscopic approach for repair with mesh. I answered his questions. He will call if symptoms worsen in the interim.

## 2014-03-06 ENCOUNTER — Ambulatory Visit (INDEPENDENT_AMBULATORY_CARE_PROVIDER_SITE_OTHER): Payer: Medicare Other | Admitting: General Surgery

## 2014-03-24 ENCOUNTER — Encounter: Payer: Self-pay | Admitting: Cardiovascular Disease

## 2014-03-24 ENCOUNTER — Ambulatory Visit (INDEPENDENT_AMBULATORY_CARE_PROVIDER_SITE_OTHER): Payer: Medicare Other | Admitting: Cardiovascular Disease

## 2014-03-24 VITALS — BP 121/77 | HR 87 | Ht 71.0 in | Wt 178.5 lb

## 2014-03-24 DIAGNOSIS — Z01818 Encounter for other preprocedural examination: Secondary | ICD-10-CM

## 2014-03-24 NOTE — Assessment & Plan Note (Signed)
Mr. Loeber is very healthy.  He walks in his house and up his stairs on a regular basis and never has any complaints.  He is active and has walked [place] problems.  He easily can exceed 4 METS of work load.  He does not need a formal stress test  His ECG is normal.  He is at low risk for his upcoming hernia surgery.    I will see him on an as needed basis.

## 2014-03-24 NOTE — Progress Notes (Signed)
Kent [name] Date of Birth  03-01-1941       [facility] : [facility] Office [address], [address], [place], [place]  [address]   [place], [place]  [address] [place]   Fax  [phone]     Fax [phone]  Problem List: 1. Ventral hernia 2. Back pain  History of Present Illness:  Kent [name] is a 73 yo who presents for pre-op evaluation prior to his ventral hernia. He has some orthopedic limitations but is active in his house.   This past summer he was doing rehab in his house and in the course of his physical therapy would walk up and down his staircase 10 times.   He can do this without CP or dyspnea.   He can bring his groceries in without problems.  He carries his bottled water up the staircase ( carries a bag full of his bottled water in each hand - 12 lbs in each bag).  He has never had any problems with this..   Current Outpatient Prescriptions on File Prior to Visit  Medication Sig Dispense Refill  . gabapentin (NEURONTIN) 300 MG capsule       . HYDROcodone-acetaminophen (NORCO/VICODIN) 5-325 MG per tablet Take 1 tablet by mouth every 8 (eight) hours as needed for pain.      . methocarbamol (ROBAXIN) 500 MG tablet Take 1,000 mg by mouth 2 (two) times daily.       Marland Kitchen omeprazole (PRILOSEC) 40 MG capsule Take 40 mg by mouth daily.      Marland Kitchen oxyCODONE (OXYCONTIN) 20 MG 12 hr tablet Take 20 mg by mouth every 12 (twelve) hours.      . psyllium (HYDROCIL/METAMUCIL) 95 % PACK Take 1 packet by mouth daily.  56 each    . Simethicone (GAS-X PO) Take 2 tablets by mouth every 6 (six) hours as needed (gas).       No current facility-administered medications on file prior to visit.    No Known Allergies  Past Medical History  Diagnosis Date  . 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 Chronic back pain     s/p back surgery  . GERD (gastroesophageal reflux disease)   . Incisional hernia   . Small bowel  obstruction due to adhesions   . 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 GERD (gastroesophageal reflux disease) 08/18/2013    Past Surgical History  Procedure Laterality Date  . Colon surgery  04/2002    Dr. Deon Pilling - Subtotal colectomy, ileostomy  . Back surgery  08/25/2001    Neck 3 disk replacements  . Incision and drainage foot    . Knee arthroscopy  2010  . Shoulder arthroscopy Bilateral   . Laparotomy N/A 01/20/2013    Procedure: EXPLORATORY LAPAROTOMY;  Surgeon: Zenovia Jarred, MD;  Location: [facility];  Service: General;  Laterality: N/A;  . Lysis of adhesion N/A 01/20/2013    Procedure: LYSIS OF ADHESION;  Surgeon: Zenovia Jarred, MD;  Location: [facility];  Service: General;  Laterality: N/A;  . Incisional hernia repair N/A 01/20/2013    Procedure: HERNIA REPAIR INCISIONAL;  Surgeon: Zenovia Jarred, MD;  Location: [facility];  Service: General;  Laterality: N/A;  . Ileostomy takedown  08/2002    Dr. Deon Pilling  . Laparotomy with lysis of adhesions  2010    Dr. Grandville Silos -  [facility]  . Back surgery      Placement/removal of nerve stimulator  . Microsurgical  03/24/2002    Lateral recess decompression L5    History  Smoking status  . Never Smoker   Smokeless tobacco  . Never Used    History  Alcohol Use No    Family History  Problem Relation Age of Onset  . Family history unknown: Yes    Reviw of Systems:  Reviewed in the HPI.  All other systems are negative.  Physical Exam: Blood pressure 121/77, pulse 87, height 5\' 11"  (1.803 m), weight 178 lb 8 oz (80.967 kg). Wt Readings from Last 3 Encounters:  03/24/14 178 lb 8 oz (80.967 kg)  01/13/14 181 lb (82.101 kg)  08/12/13 170 lb (77.111 kg)     General: Well developed, well nourished, in no acute distress.  Head: Normocephalic, atraumatic, sclera non-icteric, mucus membranes are moist,   Neck: Supple. Carotids are 2 + without bruits. No JVD   Lungs: Clear   Heart: RR, normal S1S2, no murmurs  Abdomen: Soft, non-tender,  non-distended with normal bowel sounds.  Msk:  Strength and tone are normal   Extremities: No clubbing or cyanosis. No edema.  Distal pedal pulses are 2+ and equal    Neuro: CN II - XII intact.  Alert and oriented X 3.   Psych:  Normal   ECG: Oct. 2, 2015:  NSR at 55.  No ST or T wave changes.   Assessment / Plan:

## 2014-03-24 NOTE — Patient Instructions (Addendum)
You are clear for you upcoming surgery.  Dr. Acie Fredrickson will send a note to your MD.   Your physician recommends that you schedule a follow-up appointment in:  As needed

## 2014-04-05 ENCOUNTER — Ambulatory Visit (INDEPENDENT_AMBULATORY_CARE_PROVIDER_SITE_OTHER): Payer: Self-pay | Admitting: General Surgery

## 2014-04-12 ENCOUNTER — Encounter (HOSPITAL_COMMUNITY): Payer: Self-pay

## 2014-04-12 ENCOUNTER — Encounter (HOSPITAL_COMMUNITY): Payer: Self-pay | Admitting: Pharmacy Technician

## 2014-04-12 ENCOUNTER — Encounter (HOSPITAL_COMMUNITY)
Admission: RE | Admit: 2014-04-12 | Discharge: 2014-04-12 | Disposition: A | Discharge status: Home / Self Care | Payer: Medicare Other | Source: Ambulatory Visit | Attending: General Surgery | Admitting: General Surgery

## 2014-04-12 HISTORY — DX: Chronic sinusitis, unspecified: J32.9

## 2014-04-12 LAB — CBC
HCT: 38 % — ABNORMAL LOW (ref 39.0–52.0)
HEMOGLOBIN: 13.1 g/dL (ref 13.0–17.0)
MCH: 28.9 pg (ref 26.0–34.0)
MCHC: 34.5 g/dL (ref 30.0–36.0)
MCV: 83.9 fL (ref 78.0–100.0)
PLATELETS: 158 10*3/uL (ref 150–400)
RBC: 4.53 MIL/uL (ref 4.22–5.81)
RDW: 13 % (ref 11.5–15.5)
WBC: 5.2 10*3/uL (ref 4.0–10.5)

## 2014-04-12 MED ORDER — CEFAZOLIN SODIUM-DEXTROSE 2-3 GM-% IV SOLR
2.0000 g | INTRAVENOUS | Status: AC
Start: 2014-04-13 — End: 2014-04-13
  Administered 2014-04-13: 2 g via INTRAVENOUS
  Filled 2014-04-12: qty 50

## 2014-04-12 NOTE — Pre-Procedure Instructions (Addendum)
DAQUANE AGUILAR  04/12/2014   Your procedure is scheduled on:  04-13-2014     Thursday   Report to [facility] Admitting at 5:30 AM.  Call this number if you have problems the morning of surgery: [phone]   Remember:   Do not eat food or drink liquids after midnight.    Take these medicines the morning of surgery with A SIP OF WATER:gabapentin(Neurotin),pain medication if needed,methocarbamol(robaxin),oxycontin,omeprazole(Prilosec)    Do not wear jewelry,  Do not wear lotions, powders, or perfumes. You may not wear deodorant.  Do not shave 48 hours prior to surgery. Men may shave face and neck.  Do not bring valuables to the hospital.  [facility] is not responsible for any belongings or valuables.               Contacts, dentures or bridgework may not be worn into surgery.   Leave suitcase in the car. After surgery it may be brought to your room.   For patients admitted to the hospital, discharge time is determined by your  treatment team.               Patients discharged the day of surgery will not be allowed to drive home.      Special Instructions: See attached sheet for instructions on CHG shower/bath     Please read over the following fact sheets that you were given: Pain Booklet, Coughing and Deep Breathing and Surgical Site Infection Prevention

## 2014-04-13 ENCOUNTER — Encounter (HOSPITAL_COMMUNITY)
Admission: RE | Disposition: A | Discharge status: Home / Self Care | Payer: Self-pay | Source: Ambulatory Visit | Attending: General Surgery

## 2014-04-13 ENCOUNTER — Encounter (HOSPITAL_COMMUNITY): Payer: Self-pay | Admitting: *Deleted

## 2014-04-13 ENCOUNTER — Encounter (HOSPITAL_COMMUNITY): Payer: Medicare Other | Admitting: Certified Registered"

## 2014-04-13 ENCOUNTER — Inpatient Hospital Stay (HOSPITAL_COMMUNITY)
Admission: RE | Admit: 2014-04-13 | Discharge: 2014-04-19 | DRG: 355 | Disposition: A | Discharge status: Home / Self Care | Payer: Medicare Other | Source: Ambulatory Visit | Attending: General Surgery | Admitting: General Surgery

## 2014-04-13 ENCOUNTER — Ambulatory Visit (HOSPITAL_COMMUNITY): Payer: Medicare Other | Admitting: Certified Registered"

## 2014-04-13 DIAGNOSIS — K432 Incisional hernia without obstruction or gangrene: Principal | ICD-10-CM | POA: Diagnosis present

## 2014-04-13 DIAGNOSIS — M549 Dorsalgia, unspecified: Secondary | ICD-10-CM | POA: Diagnosis present

## 2014-04-13 DIAGNOSIS — Z79891 Long term (current) use of opiate analgesic: Secondary | ICD-10-CM | POA: Diagnosis not present

## 2014-04-13 DIAGNOSIS — R109 Unspecified abdominal pain: Secondary | ICD-10-CM | POA: Diagnosis present

## 2014-04-13 DIAGNOSIS — Z79899 Other long term (current) drug therapy: Secondary | ICD-10-CM | POA: Diagnosis not present

## 2014-04-13 DIAGNOSIS — R339 Retention of urine, unspecified: Secondary | ICD-10-CM | POA: Diagnosis not present

## 2014-04-13 DIAGNOSIS — K219 Gastro-esophageal reflux disease without esophagitis: Secondary | ICD-10-CM | POA: Diagnosis not present

## 2014-04-13 DIAGNOSIS — M1990 Unspecified osteoarthritis, unspecified site: Secondary | ICD-10-CM | POA: Diagnosis present

## 2014-04-13 DIAGNOSIS — Z9889 Other specified postprocedural states: Secondary | ICD-10-CM

## 2014-04-13 DIAGNOSIS — Z8719 Personal history of other diseases of the digestive system: Secondary | ICD-10-CM

## 2014-04-13 HISTORY — PX: INCISIONAL HERNIA REPAIR: SHX193

## 2014-04-13 HISTORY — PX: INSERTION OF MESH: SHX5868

## 2014-04-13 LAB — CBC
HCT: 35 % — ABNORMAL LOW (ref 39.0–52.0)
Hemoglobin: 12.1 g/dL — ABNORMAL LOW (ref 13.0–17.0)
MCH: 29.2 pg (ref 26.0–34.0)
MCHC: 34.6 g/dL (ref 30.0–36.0)
MCV: 84.3 fL (ref 78.0–100.0)
PLATELETS: 143 10*3/uL — AB (ref 150–400)
RBC: 4.15 MIL/uL — ABNORMAL LOW (ref 4.22–5.81)
RDW: 13.2 % (ref 11.5–15.5)
WBC: 5.3 10*3/uL (ref 4.0–10.5)

## 2014-04-13 LAB — CREATININE, SERUM
Creatinine, Ser: 1.07 mg/dL (ref 0.50–1.35)
GFR calc non Af Amer: 68 mL/min — ABNORMAL LOW (ref >90)
GFR, EST AFRICAN AMERICAN: 79 mL/min — AB (ref >90)

## 2014-04-13 SURGERY — REPAIR, HERNIA, INCISIONAL
Anesthesia: General | Site: Abdomen

## 2014-04-13 MED ORDER — LIDOCAINE HCL (CARDIAC) 20 MG/ML IV SOLN
INTRAVENOUS | Status: DC | PRN
  Administered 2014-04-13: 160 mg via INTRAVENOUS

## 2014-04-13 MED ORDER — MEPERIDINE HCL 25 MG/ML IJ SOLN: 6.2500 mg | INTRAMUSCULAR | Status: DC | PRN

## 2014-04-13 MED ORDER — FENTANYL CITRATE 0.05 MG/ML IJ SOLN
INTRAMUSCULAR | Status: AC
  Filled 2014-04-13: qty 5

## 2014-04-13 MED ORDER — ROCURONIUM BROMIDE 100 MG/10ML IV SOLN
INTRAVENOUS | Status: DC | PRN
  Administered 2014-04-13: 40 mg via INTRAVENOUS
  Administered 2014-04-13: 10 mg via INTRAVENOUS

## 2014-04-13 MED ORDER — MIDAZOLAM HCL 2 MG/2ML IJ SOLN
INTRAMUSCULAR | Status: AC
  Filled 2014-04-13: qty 2

## 2014-04-13 MED ORDER — ONDANSETRON HCL 4 MG/2ML IJ SOLN
4.0000 mg | Freq: Four times a day (QID) | INTRAMUSCULAR | Status: DC | PRN
  Filled 2014-04-13: qty 2

## 2014-04-13 MED ORDER — GABAPENTIN 300 MG PO CAPS
600.0000 mg | ORAL_CAPSULE | Freq: Three times a day (TID) | ORAL | Status: DC
  Administered 2014-04-13 – 2014-04-19 (×18): 600 mg via ORAL
  Filled 2014-04-13 (×23): qty 2

## 2014-04-13 MED ORDER — BUPIVACAINE HCL (PF) 0.25 % IJ SOLN
INTRAMUSCULAR | Status: AC
  Filled 2014-04-13: qty 30

## 2014-04-13 MED ORDER — ONDANSETRON HCL 4 MG/2ML IJ SOLN
INTRAMUSCULAR | Status: DC | PRN
  Administered 2014-04-13: 4 mg via INTRAVENOUS

## 2014-04-13 MED ORDER — HYDROMORPHONE HCL 1 MG/ML IJ SOLN
0.2500 mg | INTRAMUSCULAR | Status: DC | PRN
  Administered 2014-04-13 (×2): 0.25 mg via INTRAVENOUS
  Administered 2014-04-13: 0.5 mg via INTRAVENOUS

## 2014-04-13 MED ORDER — OXYCODONE HCL ER 10 MG PO T12A
20.0000 mg | EXTENDED_RELEASE_TABLET | Freq: Two times a day (BID) | ORAL | Status: DC
  Administered 2014-04-13 – 2014-04-18 (×12): 20 mg via ORAL
  Filled 2014-04-13 (×24): qty 2

## 2014-04-13 MED ORDER — DIPHENHYDRAMINE HCL 12.5 MG/5ML PO ELIX: 12.5000 mg | ORAL_SOLUTION | Freq: Four times a day (QID) | ORAL | Status: DC | PRN

## 2014-04-13 MED ORDER — OXYCODONE HCL 5 MG/5ML PO SOLN: 5.0000 mg | Freq: Once | ORAL | Status: AC | PRN

## 2014-04-13 MED ORDER — FENTANYL CITRATE 0.05 MG/ML IJ SOLN
INTRAMUSCULAR | Status: DC | PRN
  Administered 2014-04-13: 50 ug via INTRAVENOUS
  Administered 2014-04-13: 100 ug via INTRAVENOUS
  Administered 2014-04-13 (×3): 50 ug via INTRAVENOUS

## 2014-04-13 MED ORDER — MIDAZOLAM HCL 5 MG/5ML IJ SOLN
INTRAMUSCULAR | Status: DC | PRN
  Administered 2014-04-13 (×2): 1 mg via INTRAVENOUS

## 2014-04-13 MED ORDER — PROPOFOL 10 MG/ML IV BOLUS
INTRAVENOUS | Status: AC
  Filled 2014-04-13: qty 20

## 2014-04-13 MED ORDER — GLYCOPYRROLATE 0.2 MG/ML IJ SOLN
INTRAMUSCULAR | Status: DC | PRN
  Administered 2014-04-13: .6 mg via INTRAVENOUS

## 2014-04-13 MED ORDER — PROPOFOL 10 MG/ML IV BOLUS
INTRAVENOUS | Status: DC | PRN
  Administered 2014-04-13: 100 mg via INTRAVENOUS

## 2014-04-13 MED ORDER — NEOSTIGMINE METHYLSULFATE 10 MG/10ML IV SOLN
INTRAVENOUS | Status: DC | PRN
  Administered 2014-04-13: 4 mg via INTRAVENOUS

## 2014-04-13 MED ORDER — 0.9 % SODIUM CHLORIDE (POUR BTL) OPTIME
TOPICAL | Status: DC | PRN
  Administered 2014-04-13: 1000 mL

## 2014-04-13 MED ORDER — CHLORHEXIDINE GLUCONATE 4 % EX LIQD
1.0000 | Freq: Once | CUTANEOUS | Status: DC
Start: 2014-04-14 — End: 2014-04-13
  Filled 2014-04-13: qty 15

## 2014-04-13 MED ORDER — GLYCOPYRROLATE 0.2 MG/ML IJ SOLN
INTRAMUSCULAR | Status: AC
  Filled 2014-04-13: qty 3

## 2014-04-13 MED ORDER — KCL IN DEXTROSE-NACL 20-5-0.45 MEQ/L-%-% IV SOLN
INTRAVENOUS | Status: DC
  Administered 2014-04-13 – 2014-04-18 (×10): via INTRAVENOUS
  Filled 2014-04-13 (×12): qty 1000

## 2014-04-13 MED ORDER — ONDANSETRON HCL 4 MG/2ML IJ SOLN: 4.0000 mg | Freq: Once | INTRAMUSCULAR | Status: DC | PRN

## 2014-04-13 MED ORDER — LACTATED RINGERS IV SOLN
INTRAVENOUS | Status: DC | PRN
  Administered 2014-04-13 (×2): via INTRAVENOUS

## 2014-04-13 MED ORDER — ACETAMINOPHEN 325 MG PO TABS: 650.0000 mg | ORAL_TABLET | Freq: Four times a day (QID) | ORAL | Status: DC | PRN

## 2014-04-13 MED ORDER — EPHEDRINE SULFATE 50 MG/ML IJ SOLN
INTRAMUSCULAR | Status: DC | PRN
  Administered 2014-04-13 (×5): 5 mg via INTRAVENOUS

## 2014-04-13 MED ORDER — OXYCODONE HCL 5 MG PO TABS
5.0000 mg | ORAL_TABLET | Freq: Once | ORAL | Status: AC | PRN
  Administered 2014-04-13: 5 mg via ORAL

## 2014-04-13 MED ORDER — ROCURONIUM BROMIDE 50 MG/5ML IV SOLN
INTRAVENOUS | Status: AC
  Filled 2014-04-13: qty 1

## 2014-04-13 MED ORDER — HYDROMORPHONE HCL 1 MG/ML IJ SOLN
INTRAMUSCULAR | Status: AC
  Filled 2014-04-13: qty 1

## 2014-04-13 MED ORDER — DIPHENHYDRAMINE HCL 50 MG/ML IJ SOLN
12.5000 mg | Freq: Four times a day (QID) | INTRAMUSCULAR | Status: DC | PRN
Start: 2014-04-13 — End: 2014-04-19

## 2014-04-13 MED ORDER — BUPIVACAINE-EPINEPHRINE 0.25% -1:200000 IJ SOLN
INTRAMUSCULAR | Status: DC | PRN
  Administered 2014-04-13: 20 mL

## 2014-04-13 MED ORDER — HYDROMORPHONE 0.3 MG/ML IV SOLN
INTRAVENOUS | Status: AC
  Filled 2014-04-13: qty 25

## 2014-04-13 MED ORDER — LIDOCAINE HCL (CARDIAC) 20 MG/ML IV SOLN
INTRAVENOUS | Status: AC
  Filled 2014-04-13: qty 5

## 2014-04-13 MED ORDER — NEOSTIGMINE METHYLSULFATE 10 MG/10ML IV SOLN
INTRAVENOUS | Status: AC
  Filled 2014-04-13: qty 1

## 2014-04-13 MED ORDER — ONDANSETRON HCL 4 MG PO TABS
4.0000 mg | ORAL_TABLET | Freq: Four times a day (QID) | ORAL | Status: DC | PRN
  Administered 2014-04-14: 4 mg via ORAL
  Filled 2014-04-13: qty 1

## 2014-04-13 MED ORDER — SODIUM CHLORIDE 0.9 % IJ SOLN: 9.0000 mL | INTRAMUSCULAR | Status: DC | PRN

## 2014-04-13 MED ORDER — HYDROMORPHONE 0.3 MG/ML IV SOLN
INTRAVENOUS | Status: DC
  Administered 2014-04-13: 10:00:00 via INTRAVENOUS
  Administered 2014-04-13: 0.9 mg via INTRAVENOUS
  Administered 2014-04-13: 18:00:00 via INTRAVENOUS
  Administered 2014-04-13: 1.9 mg via INTRAVENOUS
  Administered 2014-04-13: 4.8 mg via INTRAVENOUS
  Administered 2014-04-14: 2.4 mg via INTRAVENOUS
  Administered 2014-04-14: 2.1 mg via INTRAVENOUS
  Administered 2014-04-14: 1.2 mg via INTRAVENOUS
  Administered 2014-04-14: 3 mg via INTRAVENOUS
  Filled 2014-04-13 (×2): qty 25

## 2014-04-13 MED ORDER — ONDANSETRON HCL 4 MG/2ML IJ SOLN
4.0000 mg | Freq: Four times a day (QID) | INTRAMUSCULAR | Status: DC | PRN
  Administered 2014-04-13: 4 mg via INTRAVENOUS

## 2014-04-13 MED ORDER — ONDANSETRON HCL 4 MG/2ML IJ SOLN
INTRAMUSCULAR | Status: AC
  Filled 2014-04-13: qty 2

## 2014-04-13 MED ORDER — METHOCARBAMOL 500 MG PO TABS
ORAL_TABLET | ORAL | Status: AC
Start: 2014-04-13 — End: 2014-04-13
  Filled 2014-04-13: qty 2

## 2014-04-13 MED ORDER — OXYCODONE HCL 5 MG PO TABS
ORAL_TABLET | ORAL | Status: AC
  Filled 2014-04-13: qty 1

## 2014-04-13 MED ORDER — NALOXONE HCL 0.4 MG/ML IJ SOLN: 0.4000 mg | INTRAMUSCULAR | Status: DC | PRN

## 2014-04-13 MED ORDER — HEPARIN SODIUM (PORCINE) 5000 UNIT/ML IJ SOLN
5000.0000 [IU] | Freq: Three times a day (TID) | INTRAMUSCULAR | Status: DC
  Administered 2014-04-14 – 2014-04-19 (×16): 5000 [IU] via SUBCUTANEOUS
  Filled 2014-04-13 (×21): qty 1

## 2014-04-13 MED ORDER — PANTOPRAZOLE SODIUM 40 MG PO TBEC
40.0000 mg | DELAYED_RELEASE_TABLET | Freq: Every day | ORAL | Status: DC
  Administered 2014-04-14 – 2014-04-19 (×6): 40 mg via ORAL
  Filled 2014-04-13 (×6): qty 1

## 2014-04-13 MED ORDER — METHOCARBAMOL 500 MG PO TABS
1000.0000 mg | ORAL_TABLET | Freq: Two times a day (BID) | ORAL | Status: DC
  Administered 2014-04-13 – 2014-04-19 (×13): 1000 mg via ORAL
  Filled 2014-04-13 (×17): qty 2

## 2014-04-13 SURGICAL SUPPLY — 54 items
BINDER ABDOMINAL 12 ML 46-62 (SOFTGOODS) ×4 IMPLANT
BLADE SURG ROTATE 9660 (MISCELLANEOUS) IMPLANT
CANISTER SUCTION 2500CC (MISCELLANEOUS) ×4 IMPLANT
CHLORAPREP W/TINT 26ML (MISCELLANEOUS) ×4 IMPLANT
COVER SURGICAL LIGHT HANDLE (MISCELLANEOUS) ×4 IMPLANT
DERMABOND ADVANCED (GAUZE/BANDAGES/DRESSINGS)
DERMABOND ADVANCED .7 DNX12 (GAUZE/BANDAGES/DRESSINGS) IMPLANT
DRAIN CHANNEL 19F RND (DRAIN) ×8 IMPLANT
DRAPE LAPAROSCOPIC ABDOMINAL (DRAPES) ×4 IMPLANT
DRAPE UTILITY 15X26 W/TAPE STR (DRAPE) ×8 IMPLANT
ELECT CAUTERY BLADE 6.4 (BLADE) ×4 IMPLANT
ELECT REM PT RETURN 9FT ADLT (ELECTROSURGICAL) ×4
ELECTRODE REM PT RTRN 9FT ADLT (ELECTROSURGICAL) ×2 IMPLANT
EVACUATOR SILICONE 100CC (DRAIN) ×8 IMPLANT
GAUZE SPONGE 2X2 8PLY STRL LF (GAUZE/BANDAGES/DRESSINGS) ×2 IMPLANT
GAUZE SPONGE 4X4 12PLY STRL (GAUZE/BANDAGES/DRESSINGS) IMPLANT
GLOVE BIO SURGEON STRL SZ8 (GLOVE) ×8 IMPLANT
GLOVE BIOGEL M STRL SZ7.5 (GLOVE) ×4 IMPLANT
GLOVE BIOGEL PI IND STRL 6.5 (GLOVE) ×4 IMPLANT
GLOVE BIOGEL PI IND STRL 8 (GLOVE) ×4 IMPLANT
GLOVE BIOGEL PI INDICATOR 6.5 (GLOVE) ×4
GLOVE BIOGEL PI INDICATOR 8 (GLOVE) ×4
GLOVE SURG SS PI 7.0 STRL IVOR (GLOVE) ×4 IMPLANT
GOWN STRL REUS W/ TWL LRG LVL3 (GOWN DISPOSABLE) ×4 IMPLANT
GOWN STRL REUS W/ TWL XL LVL3 (GOWN DISPOSABLE) ×2 IMPLANT
GOWN STRL REUS W/TWL LRG LVL3 (GOWN DISPOSABLE) ×4
GOWN STRL REUS W/TWL XL LVL3 (GOWN DISPOSABLE) ×2
KIT BASIN OR (CUSTOM PROCEDURE TRAY) ×4 IMPLANT
KIT ROOM TURNOVER OR (KITS) ×4 IMPLANT
MARKER SKIN DUAL TIP RULER LAB (MISCELLANEOUS) ×4 IMPLANT
MESH VENT ST 19.6X24.6CM OVL (Mesh General) ×4 IMPLANT
NEEDLE 22X1 1/2 (OR ONLY) (NEEDLE) ×4 IMPLANT
NS IRRIG 1000ML POUR BTL (IV SOLUTION) ×4 IMPLANT
PACK GENERAL/GYN (CUSTOM PROCEDURE TRAY) ×4 IMPLANT
PAD ARMBOARD 7.5X6 YLW CONV (MISCELLANEOUS) ×4 IMPLANT
SPONGE GAUZE 2X2 STER 10/PKG (GAUZE/BANDAGES/DRESSINGS) ×2
SPONGE GAUZE 4X4 12PLY STER LF (GAUZE/BANDAGES/DRESSINGS) ×4 IMPLANT
STAPLER VISISTAT 35W (STAPLE) ×4 IMPLANT
SUT ETHILON 2 0 PSLX (SUTURE) ×8 IMPLANT
SUT MNCRL AB 4-0 PS2 18 (SUTURE) ×4 IMPLANT
SUT NOVA 1 T20/GS 25DT (SUTURE) ×20 IMPLANT
SUT NOVA NAB DX-16 0-1 5-0 T12 (SUTURE) ×4 IMPLANT
SUT PROLENE 0 CT 2 (SUTURE) ×8 IMPLANT
SUT VIC AB 3-0 SH 27 (SUTURE) ×2
SUT VIC AB 3-0 SH 27X BRD (SUTURE) ×2 IMPLANT
SUT VLOC 180 0 24IN GS25 (SUTURE) ×8 IMPLANT
SYR CONTROL 10ML LL (SYRINGE) ×4 IMPLANT
TAPE CLOTH SURG 4X10 WHT LF (GAUZE/BANDAGES/DRESSINGS) ×4 IMPLANT
TOWEL OR 17X24 6PK STRL BLUE (TOWEL DISPOSABLE) ×4 IMPLANT
TOWEL OR 17X26 10 PK STRL BLUE (TOWEL DISPOSABLE) ×4 IMPLANT
TRAY FOLEY BAG SILVER LF 16FR (CATHETERS) ×4 IMPLANT
TRAY FOLEY CATH 14FRSI W/METER (CATHETERS) IMPLANT
TRAY FOLEY CATH 16FRSI W/METER (SET/KITS/TRAYS/PACK) ×4 IMPLANT
WATER STERILE IRR 1000ML POUR (IV SOLUTION) IMPLANT

## 2014-04-13 NOTE — Anesthesia Procedure Notes (Signed)
Procedure Name: Intubation Date/Time: 04/13/2014 7:36 AM Performed by: Octavio Graves Pre-anesthesia Checklist: Patient identified, Timeout performed, Emergency Drugs available, Suction available and Patient being monitored Patient Re-evaluated:Patient Re-evaluated prior to inductionOxygen Delivery Method: Circle system utilized Preoxygenation: Pre-oxygenation with 100% oxygen Intubation Type: IV induction Ventilation: Mask ventilation without difficulty Laryngoscope Size: Miller and 2 Grade View: Grade I Tube type: Oral Tube size: 7.5 mm Number of attempts: 1 Airway Equipment and Method: Stylet and LTA kit utilized Placement Confirmation: ETT inserted through vocal cords under direct vision,  breath sounds checked- equal and bilateral and positive ETCO2 Secured at: 22 cm Tube secured with: Tape Dental Injury: Teeth and Oropharynx as per pre-operative assessment  Comments: IV induction Fitzgerald- intubation AM CRNA- atraumatic teeth and mouth as preop- front teeth with irregular surfaces prior to laryngoscopy- bilat BS Ola Spurr

## 2014-04-13 NOTE — Op Note (Signed)
04/13/2014  8:53 AM  PATIENT:  Kent [name]  73 y.o. male  PRE-OPERATIVE DIAGNOSIS:  incisional hernia  POST-OPERATIVE DIAGNOSIS:  incisional hernia  PROCEDURE:  Procedure(s): OPEN REPAIR INCISIONAL HERNIA  INSERTION OF MESH  SURGEON:  Georganna Skeans, M.D.  ASSISTANTS: Erroll Luna, M.D.   ANESTHESIA:   local and general  EBL:  Total I/O In: 1000 [I.V.:1000] Out: 250 [Urine:200; Blood:50]  BLOOD ADMINISTERED:none  DRAINS: (2) Jackson-Pratt drain(s) with closed bulb suction in the bilateral lower quadrant   SPECIMEN:  No Specimen  DISPOSITION OF SPECIMEN:  N/A  COUNTS:  YES  DICTATION: .Dragon Dictation Patient is an open repair of ventral hernia with mesh. He was identified in the preop holding area. Informed consent was obtained. He received intravenous antibiotics. He was brought to the operating room and general endotracheal anesthesia was administered by the anesthesia staff. Foley was placed by nursing. His abdomen was prepped and draped in sterile fashion. We did time out procedure. Incision was made and a thin elliptical fashion to excise his old midline scar. Subcutaneous tissues were dissected down and the peritoneal cavity was entered along the midline without difficulty. The midline fascia was opened and the hernia terminated several centimeters above the lower extremity incision so the fascia was left intact along the lower midline. Exploration revealed no significant adhesions whatsoever. Bowel appeared to have some chronic dilatation but no adhesions. The fascia was located on the left side of the hernia defect. A flap was raised in the subcutaneous plane just above the fascia out laterally in preparation for component separation. Hemostasis was obtained with cautery. Once we raised this flap, there was significant mobility to the fascia so we held off on doing a component separation. Next, the right side of the fascial edge was similarly located. We raised A  subcutaneous flap out laterally there as well.. Hemostasis was obtained with cautery. This afforded good mobility to the right side of the fascia. The fascia reached together in the midline so no component separation was done on either side. We ensured hemostasis and both flaps. The fascial defect was then measured and we selected a 20 x 25 cm Ventrio mesh. Mesh was placed in the abdomen flush up against the fascia with care. The mesh was then  secured in an underlay fashion with interrupted #1 Novafil's. Multiple centimeters of  underlay were achieved all around.We then closed the fascia over the top of the mesh with interrupted #1 Novafil's.Subcutaneous tissues were irrigated. Hemostasis was obtained. A 19 [place] Blake drain was placed in each lower quadrant to drain the subcutaneous flap areas. These were secured with nylon. Skin was closed with staples. All counts were correct. Sterile dressings and drain sponges were applied.Patient tolerated the procedure well without apparent complication. Binder was applied and he was taken recovery in stable condition.  PATIENT DISPOSITION:  PACU - hemodynamically stable.   Delay start of Pharmacological VTE agent (>24hrs) due to surgical blood loss or risk of bleeding:  no  Georganna Skeans, MD, MPH, FACS Pager: [phone]  10/22/20158:53 AM

## 2014-04-13 NOTE — Anesthesia Preprocedure Evaluation (Addendum)
Anesthesia Evaluation  Patient identified by MRN, date of birth, ID band Patient awake    Reviewed: Allergy & Precautions, H&P , NPO status , Patient's Chart, lab work & pertinent test results  History of Anesthesia Complications Negative for: history of anesthetic complications  Airway Mallampati: II TM Distance: >3 FB Neck ROM: Limited  Mouth opening: Limited Mouth Opening  Dental  (+) Dental Advisory Given   Pulmonary neg pulmonary ROS,          Cardiovascular negative cardio ROS      Neuro/Psych Back pain that is managed with ATC narcotics- pain does involve leg pain    GI/Hepatic Neg liver ROS, GERD-  Medicated and Controlled,  Endo/Other  negative endocrine ROS  Renal/GU negative Renal ROS  negative genitourinary   Musculoskeletal  (+) Arthritis -,   Abdominal   Peds  Hematology   Anesthesia Other Findings   Reproductive/Obstetrics                           Anesthesia Physical Anesthesia Plan  ASA: II  Anesthesia Plan: General   Post-op Pain Management:    Induction: Intravenous  Airway Management Planned: Oral ETT  Additional Equipment:   Intra-op Plan:   Post-operative Plan: Extubation in OR  Informed Consent: I have reviewed the patients History and Physical, chart, labs and discussed the procedure including the risks, benefits and alternatives for the proposed anesthesia with the patient or authorized representative who has indicated his/her understanding and acceptance.     Plan Discussed with: CRNA and Surgeon  Anesthesia Plan Comments:        Anesthesia Quick Evaluation

## 2014-04-13 NOTE — Interval H&P Note (Signed)
History and Physical Interval Note:  04/13/2014 6:49 AM  Kent [name]  has presented today for surgery, with the diagnosis of incisional hernia  The various methods of treatment have been discussed with the patient and family. After consideration of risks, benefits and other options for treatment, the patient has consented to  Procedure(s): [place] (N/A) INSERTION OF MESH (N/A) as a surgical intervention .  The patient's history has been reviewed, patient examined, no change in status, stable for surgery.  I have reviewed the patient's chart and labs.  Questions were answered to the patient's satisfaction.     [name] E

## 2014-04-13 NOTE — H&P (Signed)
History of Present Illness Kent Neri E. Grandville Silos MD; 03/06/2014 4:14 PM) Patient words: Kent [name].  The patient is a 73 year old male who presents with an incisional [name]. patient presents for followup of incisional [name]. He feels it has gotten a little bigger and is more symptomatic. He occasionally has associated frequent bowel movements. He has pain associated with his abdominal wall. He also has chronic back pain from arthritis and underwent some injections for that. No obstructive symptoms.   Other Problems Kent [name]; 03/06/2014 4:04 PM) No pertinent past medical history  Past Surgical History Kent [name]; 03/06/2014 4:04 PM) Anal Fissure Repair Colon Polyp Removal - Colonoscopy Colon Removal - Partial Hemorrhoidectomy Knee Surgery Right. Open Inguinal [name] Surgery Right. Resection of Small Bowel Resection of Stomach Shoulder Surgery Bilateral. Spinal Surgery - Lower Back Spinal Surgery - Neck Vasectomy  Diagnostic Studies History Kent [name]; 03/06/2014 4:04 PM) Colonoscopy within last year  Allergies Kent [name]; 03/06/2014 4:05 PM) No Known Drug Allergies09/14/2015  Medication History Kent [name]; 03/06/2014 4:06 PM) PriLOSEC OTC (20MG  Tablet DR, Oral daily) Active. OxyCONTIN (20MG  Tablet ER 12HR, Oral two times daily) Active. Gabapentin Enacarbil ER (300MG  Tablet ER 24HR, Oral three times daily) Active. Methocarbamol (500MG  Tablet, Oral two times daily) Active. Vicodin (5-300MG  Tablet, Oral three times daily) Active.  Social History Kent [name]; 03/06/2014 4:04 PM) Caffeine use Carbonated beverages. No alcohol use No drug use Tobacco use Never smoker.  Family History Kent [name]; 03/06/2014 4:04 PM) Cerebrovascular Accident Father.  Review of Systems Kent [name]; 03/06/2014 4:04 PM) General Not Present- Appetite Loss, Chills, Fatigue, Fever, Night Sweats, Weight Gain and Weight Loss. Skin Not  Present- Change in Wart/Mole, Dryness, Hives, Jaundice, New Lesions, Non-Healing Wounds, Rash and Ulcer. HEENT Present- Wears glasses/contact lenses. Not Present- Earache, Hearing Loss, Hoarseness, Nose Bleed, Oral Ulcers, Ringing in the Ears, Seasonal Allergies, Sinus Pain, Sore Throat, Visual Disturbances and Yellow Eyes. Respiratory Not Present- Bloody sputum, Chronic Cough, Difficulty Breathing, Snoring and Wheezing. Breast Not Present- Breast Mass, Breast Pain, Nipple Discharge and Skin Changes. Cardiovascular Not Present- Chest Pain, Difficulty Breathing Lying Down, Leg Cramps, Palpitations, Rapid Heart Rate, Shortness of Breath and Swelling of Extremities. Gastrointestinal Present- Excessive gas. Not Present- Abdominal Pain, Bloating, Bloody Stool, Change in Bowel Habits, Chronic diarrhea, Constipation, Difficulty Swallowing, Gets full quickly at meals, Hemorrhoids, Indigestion, Nausea, Rectal Pain and Vomiting. Male Genitourinary Not Present- Blood in Urine, Change in Urinary Stream, Frequency, Impotence, Nocturia, Painful Urination, Urgency and Urine Leakage. Musculoskeletal Present- Back Pain. Not Present- Joint Pain, Joint Stiffness, Muscle Pain, Muscle Weakness and Swelling of Extremities. Neurological Present- Tingling. Not Present- Decreased Memory, Fainting, Headaches, Numbness, Seizures, Tremor, Trouble walking and Weakness. Psychiatric Not Present- Anxiety, Bipolar, Change in Sleep Pattern, Depression, Fearful and Frequent crying. Endocrine Not Present- Cold Intolerance, Excessive Hunger, Hair Changes, Heat Intolerance, Hot flashes and New Diabetes. Hematology Not Present- Easy Bruising, Excessive bleeding, Gland problems, HIV and Persistent Infections.   Vitals Kent Schneiders [place]; 03/06/2014 4:05 PM) 03/06/2014 4:04 PM Weight: 179.38 lb Height: 71in Body Surface Area: 2.02 m Body Mass Index: 25.02 kg/m Temp.: 98.67F(Oral)  Pulse: 76 (Regular)  Resp.: 16 (Unlabored)   BP: 120/62 (Sitting, Left Arm, Standard)    Physical Exam (Kent [name] E. Grandville Silos MD; 03/06/2014 4:16 PM) General Note: awake and alert   Head and Neck Note: normocephalic   Chest and Lung Exam Note: clear to auscultation bilaterally, no respiratory distress, no wheezing   Cardiovascular Note: regular rate and rhythm, no murmurs   Abdomen Note:  soft, incisional [name] is slightly larger. The fascial defect approximately 12 cm. Reduces spontaneously when he lies down. No evidence of incarceration. Mild tenderness on exam   Neurologic Note: awake and alert, steady gait   Musculoskeletal Note: well-developed, no deformity     Assessment & Plan Kent Neri E. Grandville Silos MD; 03/06/2014 4:17 PM) INCISIONAL [name], WITHOUT OBSTRUCTION OR GANGRENE (553.21  K43.2) Impression: patient is more symptomatic. I will discuss his current medical conditions with his primary care physician, Kent [name]. I feel we will need to plan for open repair due to the size of the [name]. Procedure, risks, and benefits were discussed. I will speak with him again after I contact Kent [name].  Update: Patient received pre-op clearance from cardiology. Note in EPIC.  Kent Skeans, MD, MPH, FACS Trauma: [phone] General Surgery: [phone]

## 2014-04-13 NOTE — Anesthesia Postprocedure Evaluation (Signed)
Anesthesia Post Note  Patient: Kent [name]  Procedure(s) Performed: Procedure(s) (LRB): OPEN REPAIR INCISIONAL HERNIA  (N/A) INSERTION OF MESH (N/A)  Anesthesia type: general  Patient location: PACU  Post pain: Pain level controlled  Post assessment: Patient's Cardiovascular Status Stable  Last Vitals:  Filed Vitals:   04/13/14 1149  BP: 147/67  Pulse: 89  Temp: 36.8 C  Resp: 20    Post vital signs: Reviewed and stable  Level of consciousness: sedated  Complications: No apparent anesthesia complications

## 2014-04-13 NOTE — Transfer of Care (Signed)
Immediate Anesthesia Transfer of Care Note  Patient: Kent [name]  Procedure(s) Performed: Procedure(s): OPEN REPAIR INCISIONAL HERNIA  (N/A) INSERTION OF MESH (N/A)  Patient Location: PACU  Anesthesia Type:General  Level of Consciousness: awake and alert   Airway & Oxygen Therapy: Patient Spontanous Breathing and Patient connected to nasal cannula oxygen  Post-op Assessment: Report given to PACU RN and Post -op Vital signs reviewed and stable  Post vital signs: Reviewed and stable  Complications: No apparent anesthesia complications

## 2014-04-14 ENCOUNTER — Encounter (HOSPITAL_COMMUNITY): Payer: Self-pay | Admitting: General Surgery

## 2014-04-14 LAB — BASIC METABOLIC PANEL
ANION GAP: 11 (ref 5–15)
BUN: 10 mg/dL (ref 6–23)
CO2: 28 meq/L (ref 19–32)
Calcium: 8.7 mg/dL (ref 8.4–10.5)
Chloride: 100 mEq/L (ref 96–112)
Creatinine, Ser: 1 mg/dL (ref 0.50–1.35)
GFR calc Af Amer: 85 mL/min — ABNORMAL LOW (ref >90)
GFR, EST NON AFRICAN AMERICAN: 74 mL/min — AB (ref >90)
Glucose, Bld: 164 mg/dL — ABNORMAL HIGH (ref 70–99)
Potassium: 4.4 mEq/L (ref 3.7–5.3)
SODIUM: 139 meq/L (ref 137–147)

## 2014-04-14 LAB — CBC
HEMATOCRIT: 37.3 % — AB (ref 39.0–52.0)
Hemoglobin: 12.5 g/dL — ABNORMAL LOW (ref 13.0–17.0)
MCH: 28.9 pg (ref 26.0–34.0)
MCHC: 33.5 g/dL (ref 30.0–36.0)
MCV: 86.3 fL (ref 78.0–100.0)
PLATELETS: 142 10*3/uL — AB (ref 150–400)
RBC: 4.32 MIL/uL (ref 4.22–5.81)
RDW: 13.2 % (ref 11.5–15.5)
WBC: 6.1 10*3/uL (ref 4.0–10.5)

## 2014-04-14 MED ORDER — ALUM & MAG HYDROXIDE-SIMETH 200-200-20 MG/5ML PO SUSP
30.0000 mL | ORAL | Status: DC | PRN
  Administered 2014-04-14: 30 mL via ORAL
  Filled 2014-04-14: qty 30

## 2014-04-14 MED ORDER — HYDROMORPHONE HCL 1 MG/ML IJ SOLN: 1.0000 mg | INTRAMUSCULAR | Status: DC | PRN

## 2014-04-14 MED ORDER — OXYCODONE HCL 5 MG PO TABS
5.0000 mg | ORAL_TABLET | ORAL | Status: DC | PRN
  Administered 2014-04-15 (×2): 10 mg via ORAL
  Administered 2014-04-16: 5 mg via ORAL
  Administered 2014-04-17 – 2014-04-19 (×4): 10 mg via ORAL
  Filled 2014-04-14 (×3): qty 2
  Filled 2014-04-14: qty 1
  Filled 2014-04-14 (×4): qty 2

## 2014-04-14 NOTE — Progress Notes (Signed)
1 Day Post-Op  Subjective: Hates PCA, Robaxin helping, not moving around much yet  Objective: Vital signs in last 24 hours: Temp:  [97.4 F (36.3 C)-98.4 F (36.9 C)] 98.3 F (36.8 C) (10/23 0515) Pulse Rate:  [68-98] 96 (10/23 0515) Resp:  [13-25] 16 (10/23 0544) BP: (145-186)/(67-78) 163/70 mmHg (10/23 0600) SpO2:  [85 %-100 %] 99 % (10/23 0544) Weight:  [177 lb 0.5 oz (80.3 kg)] 177 lb 0.5 oz (80.3 kg) (10/22 1149) Last BM Date: 04/12/14  Intake/Output from previous day: 10/22 0701 - 10/23 0700 In: 2824 [P.O.:120; I.V.:2704] Out: 1885 [Urine:1625; Drains:210; Blood:50] Intake/Output this shift:    General appearance: alert and cooperative Resp: clear to auscultation bilaterally Cardio: regular rate and rhythm GI: soft, dressing CDI, drains small amount blood, +BS  Lab Results:   Recent Labs  04/13/14 1247 04/14/14 0349  WBC 5.3 6.1  HGB 12.1* 12.5*  HCT 35.0* 37.3*  PLT 143* 142*   BMET  Recent Labs  04/13/14 1247 04/14/14 0349  NA  --  139  K  --  4.4  CL  --  100  CO2  --  28  GLUCOSE  --  164*  BUN  --  10  CREATININE 1.07 1.00  CALCIUM  --  8.7   PT/INR No results found for this basename: LABPROT, INR,  in the last 72 hours ABG No results found for this basename: PHART, PCO2, PO2, HCO3,  in the last 72 hours  Studies/Results: No results found.  Anti-infectives: Anti-infectives   Start     Dose/Rate Route Frequency Ordered Stop   04/13/14 0600  ceFAZolin (ANCEF) IVPB 2 g/50 mL premix     2 g 100 mL/hr over 30 Minutes Intravenous On call to O.R. 04/12/14 1438 04/13/14 0738      Assessment/Plan: s/p Procedure(s): OPEN REPAIR INCISIONAL HERNIA  (N/A) INSERTION OF MESH (N/A) POD#1 Advance diet D/C PCA and start oral + IV meds Plan D/C foley tomorrow PT/OT VTE - SQ hep  LOS: 1 day    Joline Encalada E 04/14/2014

## 2014-04-14 NOTE — Progress Notes (Signed)
Notified Dr Grandville Silos of pt refusing for foley to be discontinued this am.

## 2014-04-14 NOTE — Progress Notes (Signed)
Pt refused foley catheter to be discontinued this am,will notify md.

## 2014-04-15 MED ORDER — MENTHOL 3 MG MT LOZG
1.0000 | LOZENGE | OROMUCOSAL | Status: DC | PRN
  Filled 2014-04-15: qty 9

## 2014-04-15 MED ORDER — HYDROMORPHONE HCL 1 MG/ML IJ SOLN: 1.0000 mg | INTRAMUSCULAR | Status: DC | PRN

## 2014-04-15 NOTE — Progress Notes (Signed)
2 Days Post-Op  Subjective: No flatus, some nausea with diet, oob to chair, pain fair control  Objective: Vital signs in last 24 hours: Temp:  [97.8 F (36.6 C)-98.6 F (37 C)] 97.8 F (36.6 C) (10/24 0544) Pulse Rate:  [94-98] 96 (10/24 0544) Resp:  [18] 18 (10/24 0544) BP: (128-155)/(62-66) 128/62 mmHg (10/24 0544) SpO2:  [94 %-99 %] 94 % (10/24 0544) Last BM Date: 04/12/14  Intake/Output from previous day: 10/23 0701 - 10/24 0700 In: 1875 [P.O.:600; I.V.:1275] Out: 1725 [Urine:1635; Drains:90] Intake/Output this shift: Total I/O In: 118 [P.O.:118] Out: 0   General appearance: no distress Resp: clear to auscultation bilaterally Cardio: regular rate and rhythm GI: incision clean, drains serous, few bs, mild distended,approp tender  Lab Results:   Recent Labs  04/13/14 1247 04/14/14 0349  WBC 5.3 6.1  HGB 12.1* 12.5*  HCT 35.0* 37.3*  PLT 143* 142*   BMET  Recent Labs  04/13/14 1247 04/14/14 0349  NA  --  139  K  --  4.4  CL  --  100  CO2  --  28  GLUCOSE  --  164*  BUN  --  10  CREATININE 1.07 1.00  CALCIUM  --  8.7   PT/INR No results found for this basename: LABPROT, INR,  in the last 72 hours ABG No results found for this basename: PHART, PCO2, PO2, HCO3,  in the last 72 hours  Studies/Results: No results found.  Anti-infectives: Anti-infectives   Start     Dose/Rate Route Frequency Ordered Stop   04/13/14 0600  ceFAZolin (ANCEF) IVPB 2 g/50 mL premix     2 g 100 mL/hr over 30 Minutes Intravenous On call to O.R. 04/12/14 1438 04/13/14 0738      Assessment/Plan: POD 2 open vh repair with mesh 1. Po pain meds with iv backup, increased frequency, his back pain makes this much harder to control 2. pulm toilet, oob, ambulate, ics 3. Still has ileus, a little distended no flatus, will keep at fulls today due to some nausea with regular 4. Heparin, scds 5. Foley out today   [facility] 04/15/2014

## 2014-04-16 NOTE — Evaluation (Signed)
Occupational Therapy Evaluation Patient Details Name: Kent [name] MRN: [id number] DOB: 10/08/1940 Today's Date: 04/16/2014    History of Present Illness pt is an active 73 y.o. male s/p OPEN REPAIR INCISIONAL HERNIA  on 04/13/14.   Clinical Impression   Pt s/p above. Pt independent with ADLs, PTA. Feel pt will benefit from acute OT to address bed mobility and increase independence prior to d/c.     Follow Up Recommendations  No OT follow up;Supervision/Assistance - 24 hour    Equipment Recommendations  None recommended by OT    Recommendations for Other Services       Precautions / Restrictions Precautions Precaution Comments: mutiple drains Required Braces or Orthoses: Other Brace/Splint Other Brace/Splint: abdominal binder Restrictions Weight Bearing Restrictions: No      Mobility Bed Mobility Overal bed mobility: Needs Assistance Bed Mobility: Rolling;Sidelying to Sit;Sit to Sidelying Rolling: Min assist Sidelying to sit: Mod assist     Sit to sidelying: Mod assist General bed mobility comments: assist with legs to get to sidelying position. Cues for log roll technique to try to help reduce chance of pain. Assist with trunk to sit EOB. Educated wife how to assist.  Transfers Overall transfer level: Needs assistance Equipment used: Rolling walker (2 wheeled) Transfers: Sit to/from Stand Sit to Stand: Min guard         General transfer comment: cues for hand placement.    Balance                                            ADL Overall ADL's : Needs assistance/impaired                Upper body dressing: Setup/Supervision; sitting     Lower Body Dressing: Maximal assistance;Sit to/from stand   Toilet Transfer: Min guard;Ambulation;RW (bed)           Functional mobility during ADLs: Min guard;Rolling walker General ADL Comments: Educated on AE for LB ADLs and discussed elastic shoe laces. Educated on shower transfer  technique and recommended sitting for LB bathing. Educated on safety-use of bag on walker, safe shoewear, rugs.      Vision                     Perception     Praxis      Pertinent Vitals/Pain Pain Assessment: 0-10 Pain Score:  (over 10) Pain Location: abdomen Pain Intervention(s): Repositioned     Hand Dominance     Extremity/Trunk Assessment Upper Extremity Assessment Upper Extremity Assessment: Overall WFL for tasks assessed   Lower Extremity Assessment Lower Extremity Assessment: Defer to PT evaluation       Communication Communication Communication: No difficulties   Cognition Arousal/Alertness: Awake/alert Behavior During Therapy: WFL for tasks assessed/performed Overall Cognitive Status: Within Functional Limits for tasks assessed                     General Comments       Exercises       Shoulder Instructions      Home Living Family/patient expects to be discharged to:: Private residence Living Arrangements: Spouse/significant other;Children Available Help at Discharge: Family;Available 24 hours/day Type of Home: House Home Access: Stairs to enter [employer] of Steps: 3 Entrance Stairs-Rails: None Home Layout: Two level;Bed/bath upstairs Alternate Level Stairs-Number of Steps: 14 Alternate Level Stairs-Rails: Right Bathroom  Shower/Tub: Walk-in shower         Home Equipment: Environmental consultant - 2 wheels;Bedside commode          Prior Functioning/Environment Level of Independence: Independent             OT Diagnosis: Acute pain   OT Problem List: Pain;Decreased knowledge of precautions;Decreased knowledge of use of DME or AE;Decreased activity tolerance;Decreased strength;Decreased range of motion   OT Treatment/Interventions: Self-care/ADL training;DME and/or AE instruction;Therapeutic activities;Patient/family education;Balance training    OT Goals(Current goals can be found in the care plan section) Acute Rehab  OT Goals Patient Stated Goal: be independent  OT Goal Formulation: With patient Time For Goal Achievement: 04/23/14 Potential to Achieve Goals: Good ADL Goals Pt Will Transfer to Toilet: with modified independence;ambulating Pt Will Perform Toileting - Clothing Manipulation and hygiene: with modified independence;sit to/from stand Pt Will Perform Tub/Shower Transfer: Shower transfer;with supervision;ambulating;3 in 1;rolling walker Additional ADL Goal #1: Caregiver will be independent with assisting pt with bed mobility as precursor for ADLs.   OT Frequency: Min 2X/week   Barriers to D/C:            Co-evaluation              End of Session Equipment Utilized During Treatment: Gait belt;Rolling walker  Activity Tolerance: Patient tolerated treatment well Patient left: in bed;with call bell/phone within reach;with family/visitor present   Time: 1128-1200 OT Time Calculation (min): 32 min Charges:  OT General Charges $OT Visit: 1 Procedure OT Evaluation $Initial OT Evaluation Tier I: 1 Procedure OT Treatments $Self Care/Home Management : 8-22 mins G-CodesBenito Mccreedy OTR/L [phone] 04/16/2014, 1:16 PM

## 2014-04-16 NOTE — Progress Notes (Signed)
04/14/14 1605  PT Visit Information  Last PT Received On 04/14/14  Assistance Needed +1  History of Present Illness pt is an active 73 y.o. male s/p OPEN REPAIR INCISIONAL HERNIA  on 04/13/14.  Precautions  Precautions None  Precaution Comments mutiple drains  Required Braces or Orthoses Other Brace/Splint  Other Brace/Splint abdominal binder  Restrictions  Weight Bearing Restrictions No  Home Living  Family/patient expects to be discharged to: Private residence  Living Arrangements Spouse/significant other;Children  Available Help at Discharge Family;Available 24 hours/day  Type of Home House  Home Access Stairs to enter  Entrance Stairs-Number of Steps 3  Entrance Stairs-Rails None  Home Layout Two level;Bed/bath upstairs  Alternate Level Stairs-Number of Steps 14  Alternate Level Stairs-Rails Right  Home Equipment Walker - 2 wheels;BSC;Shower seat  Additional Comments pt has walk in shower and tub shower   Prior Function  Level of Independence Independent  Communication  Communication No difficulties  Pain Assessment  Pain Assessment 0-10  Pain Score (10.5)  Pain Location c/o pain in back more so than surgical area   Pain Descriptors / Indicators Aching;Constant;Shooting  Pain Intervention(s) Monitored during session;Premedicated before session;Repositioned  Cognition  Arousal/Alertness Awake/alert  Behavior During Therapy WFL for tasks assessed/performed  Overall Cognitive Status Within Functional Limits for tasks assessed  Upper Extremity Assessment  Upper Extremity Assessment Defer to OT evaluation  Lower Extremity Assessment  Lower Extremity Assessment Generalized weakness  Cervical / Trunk Assessment  Cervical / Trunk Assessment Kyphotic  Bed Mobility  Overal bed mobility Needs Assistance  Bed Mobility Sit to Sidelying;Rolling  Rolling Supervision  Sit to sidelying Min assist  General bed mobility comments (A) to advance LEs into sidelying; cues for log  rolling to reduce stress on abdomen; incr time due to pain   Transfers  Overall transfer level Needs assistance  Equipment used Rolling walker (2 wheeled)  Transfers Sit to/from Stand  Sit to Stand Min guard  General transfer comment min guard to steady with sit to stand from chair; cues for hand placement and technique   Ambulation/Gait  Ambulation/Gait assistance Supervision  Ambulation Distance (Feet) 170 Feet  Assistive device Rolling walker (2 wheeled)  Gait Pattern/deviations Step-through pattern;Shuffle;Decreased stride length;Trunk flexed  Gait velocity very decr with multiple breaks  Gait velocity interpretation Below normal speed for age/gender  General Gait Details multiple standing rest breaks due to fatigue; cues for gt sequencing and upright posture   Balance  Overall balance assessment Needs assistance  Sitting-balance support Feet supported;No upper extremity supported  Sitting balance-Leahy Scale Fair  Standing balance support During functional activity;Bilateral upper extremity supported  Standing balance-Leahy Scale Poor  Standing balance comment pt relying heavily on RW for support/balance   General Comments  General comments (skin integrity, edema, etc.) encouraged pt to ambulate hallway daily as tolerated with nursing  PT - End of Session  Equipment Utilized During Treatment Other (comment) (abdominal binder)  Activity Tolerance Patient tolerated treatment well  Patient left in bed;with call bell/phone within reach;with family/visitor present  Nurse Communication Mobility status  PT Assessment  PT Therapy Diagnosis  Difficulty walking;Generalized weakness;Acute pain  PT Recommendation/Assessment Patient needs continued PT services  PT Problem List Decreased strength;Decreased activity tolerance;Decreased balance;Decreased mobility;Pain;Decreased knowledge of use of DME  Barriers to Discharge Inaccessible home environment  Barriers to Discharge Comments  multiple steps in house  PT Plan  PT Frequency Min 3X/week  PT Treatment/Interventions DME instruction;Gait training;Stair training;Functional mobility training;Therapeutic activities;Therapeutic exercise;Balance training;Neuromuscular re-education;Patient/family education  PT  Recommendation  Recommendations for Other Services OT consult  Follow Up Recommendations Home health PT;Supervision/Assistance - 24 hour  PT equipment None recommended by PT  Individuals Consulted  Consulted and Agree with Results and Recommendations Patient;Family member/caregiver  Family Member Consulted wife  Acute Rehab PT Goals  Patient Stated Goal to go home monday  PT Goal Formulation With patient  Time For Goal Achievement 04/19/14  Potential to Achieve Goals Good  PT Time Calculation  PT Start Time 1520  PT Stop Time 1543  PT Time Calculation (min) 23 min  PT General Charges  $$ ACUTE PT VISIT 1 Procedure  PT Evaluation  $Initial PT Evaluation Tier I 1 Procedure  PT Treatments  $Gait Training 8-22 mins    Late entry. Pt evaluated on 04/14/14.  [place], [place], [place]

## 2014-04-16 NOTE — Progress Notes (Signed)
3 Days Post-Op  Subjective: Pain controlled, ambulating, not taking fulls much, some nausea, no flatus   Objective: Vital signs in last 24 hours: Temp:  [97.8 F (36.6 C)-98.3 F (36.8 C)] 98.3 F (36.8 C) (10/25 0545) Pulse Rate:  [93-116] 93 (10/25 0545) Resp:  [17-18] 18 (10/25 0545) BP: (108-137)/(64-70) 137/70 mmHg (10/25 0545) SpO2:  [93 %-95 %] 93 % (10/25 0545) Last BM Date: 04/12/14  Intake/Output from previous day: 10/24 0701 - 10/25 0700 In: 1843 [P.O.:118; I.V.:1725] Out: 2505 [Urine:2450; Drains:55] Intake/Output this shift: Total I/O In: 772.5 [I.V.:772.5] Out: -   General appearance: no distress Resp: clear to auscultation bilaterally Cardio: regular rate and rhythm GI: mild distended no bs wound clean drains serous  Lab Results:   Recent Labs  04/13/14 1247 04/14/14 0349  WBC 5.3 6.1  HGB 12.1* 12.5*  HCT 35.0* 37.3*  PLT 143* 142*   BMET  Recent Labs  04/13/14 1247 04/14/14 0349  NA  --  139  K  --  4.4  CL  --  100  CO2  --  28  GLUCOSE  --  164*  BUN  --  10  CREATININE 1.07 1.00  CALCIUM  --  8.7    Anti-infectives: Anti-infectives   Start     Dose/Rate Route Frequency Ordered Stop   04/13/14 0600  ceFAZolin (ANCEF) IVPB 2 g/50 mL premix     2 g 100 mL/hr over 30 Minutes Intravenous On call to O.R. 04/12/14 1438 04/13/14 0738      Assessment/Plan: POD 3 open vh repair with mesh  1. Po pain meds with iv backup, will leave this as is for today as did better, if ileus worsens will need to switch to all iv 2. pulm toilet, oob, ambulate, ics  3. Still has ileus, a little distended no flatus, npo with sips/chips today 4. Heparin, scds  5. Urinary retention- foley reinserted will leave in place today   [facility] 04/16/2014

## 2014-04-17 LAB — BASIC METABOLIC PANEL WITH GFR
Anion gap: 7 (ref 5–15)
BUN: 7 mg/dL (ref 6–23)
CO2: 30 meq/L (ref 19–32)
Calcium: 9 mg/dL (ref 8.4–10.5)
Chloride: 100 meq/L (ref 96–112)
Creatinine, Ser: 0.9 mg/dL (ref 0.50–1.35)
GFR calc Af Amer: >90 mL/min (ref >90)
GFR calc non Af Amer: 84 mL/min — ABNORMAL LOW (ref >90)
Glucose, Bld: 139 mg/dL — ABNORMAL HIGH (ref 70–99)
Potassium: 4.7 meq/L (ref 3.7–5.3)
Sodium: 137 meq/L (ref 137–147)

## 2014-04-17 MED ORDER — LEVOFLOXACIN 500 MG PO TABS
500.0000 mg | ORAL_TABLET | Freq: Every day | ORAL | Status: DC
  Administered 2014-04-17 – 2014-04-19 (×3): 500 mg via ORAL
  Filled 2014-04-17 (×4): qty 1

## 2014-04-17 MED ORDER — BETHANECHOL CHLORIDE 25 MG PO TABS
25.0000 mg | ORAL_TABLET | Freq: Three times a day (TID) | ORAL | Status: DC
  Administered 2014-04-17 – 2014-04-19 (×7): 25 mg via ORAL
  Filled 2014-04-17 (×11): qty 1

## 2014-04-17 MED ORDER — TAMSULOSIN HCL 0.4 MG PO CAPS
0.4000 mg | ORAL_CAPSULE | Freq: Every day | ORAL | Status: DC
  Administered 2014-04-17 – 2014-04-19 (×3): 0.4 mg via ORAL
  Filled 2014-04-17 (×4): qty 1

## 2014-04-17 NOTE — Progress Notes (Signed)
Physical Therapy Treatment Patient Details Name: Kent [name] MRN: [id number] DOB: [birth year] Today's Date: 04/17/2014    History of Present Illness pt is an active 73 y.o. male s/p OPEN REPAIR INCISIONAL HERNIA  on 04/13/14.    PT Comments    Patient moving well. Some cues for general safety. Will need to attempt full flight of steps prior to DC home.   Follow Up Recommendations  Home health PT;Supervision/Assistance - 24 hour     Equipment Recommendations  None recommended by PT    Recommendations for Other Services       Precautions / Restrictions Precautions Precautions: None Required Braces or Orthoses: Other Brace/Splint Other Brace/Splint: abdominal binder    Mobility  Bed Mobility               General bed mobility comments: Up in recliner before and after session  Transfers Overall transfer level: Modified independent                  Ambulation/Gait Ambulation/Gait assistance: Supervision Ambulation Distance (Feet): 600 Feet Assistive device: Rolling walker (2 wheeled) Gait Pattern/deviations: Step-through pattern;Decreased stride length     General Gait Details: Cues for safety with RW.    Stairs Stairs: Yes Stairs assistance: Min guard Stair Management: Step to pattern;Forwards;One rail Right Number of Stairs: 4 General stair comments: Slight increase in pain. Unable to complete full flight due to IV pole  Wheelchair Mobility    Modified Rankin (Stroke Patients Only)       Balance                                    Cognition Arousal/Alertness: Awake/alert Behavior During Therapy: WFL for tasks assessed/performed Overall Cognitive Status: Within Functional Limits for tasks assessed                      Exercises      General Comments        Pertinent Vitals/Pain Pain Score: 3  Pain Location: abdomen Pain Descriptors / Indicators: Aching;Sore Pain Intervention(s): Monitored during session     Home Living                      Prior Function            PT Goals (current goals can now be found in the care plan section) Progress towards PT goals: Progressing toward goals    Frequency  Min 3X/week    PT Plan Current plan remains appropriate    Co-evaluation             End of Session Equipment Utilized During Treatment:  (abd binder) Activity Tolerance: Patient tolerated treatment well Patient left: in chair;with call bell/phone within reach;with family/visitor present     Time: 4010-2725 PT Time Calculation (min): 17 min  Charges:  $Gait Training: 8-22 mins                    G Codes:      Jacqualyn Posey 04/17/2014, 9:08 AM 04/17/2014 Jacqualyn Posey PTA [phone] pager [phone] office

## 2014-04-17 NOTE — Progress Notes (Addendum)
4 Days Post-Op  Subjective: Large BM X 2, C/O sinusitis - was on Levaquin before surgery, foley replaced for retention  Objective: Vital signs in last 24 hours: Temp:  [97.8 F (36.6 C)-98.2 F (36.8 C)] 97.8 F (36.6 C) (10/26 0447) Pulse Rate:  [78-114] 78 (10/26 0447) Resp:  [18-19] 18 (10/26 0447) BP: (107-128)/(59-68) 122/59 mmHg (10/26 0447) SpO2:  [95 %-98 %] 97 % (10/26 0447) Last BM Date: 04/16/14  Intake/Output from previous day: 10/25 0701 - 10/26 0700 In: 1877.5 [P.O.:480; I.V.:1397.5] Out: 5156 [Urine:5150; Drains:6] Intake/Output this shift:    General appearance: alert and cooperative Resp: clear to auscultation bilaterally Cardio: regular rate and rhythm GI: soft, incision OK, min output from drains, +BS  Lab Results:  No results found for this basename: WBC, HGB, HCT, PLT,  in the last 72 hours BMET  Recent Labs  04/17/14 0006  NA 137  K 4.7  CL 100  CO2 30  GLUCOSE 139*  BUN 7  CREATININE 0.90  CALCIUM 9.0   PT/INR No results found for this basename: LABPROT, INR,  in the last 72 hours ABG No results found for this basename: PHART, PCO2, PO2, HCO3,  in the last 72 hours  Studies/Results: No results found.  Anti-infectives: Anti-infectives   Start     Dose/Rate Route Frequency Ordered Stop   04/17/14 1000  levofloxacin (LEVAQUIN) tablet 500 mg     500 mg Oral Daily 04/17/14 0759 04/22/14 0959   04/13/14 0600  ceFAZolin (ANCEF) IVPB 2 g/50 mL premix     2 g 100 mL/hr over 30 Minutes Intravenous On call to O.R. 04/12/14 1438 04/13/14 0738      Assessment/Plan: s/p Procedure(s): OPEN REPAIR INCISIONAL HERNIA  (N/A) INSERTION OF MESH (N/A) POD#4 Advance diet Urinary retention - add Flomax and Urecholine, Plan D/C foley tomorrow SInusitis - resume Levaquin PT/OT D/C JPs VTE - SQ hep DIspo - hope to D/C tomorrow  LOS: 4 days    Kessie Croston E 04/17/2014

## 2014-04-18 ENCOUNTER — Ambulatory Visit: Payer: Medicare Other | Admitting: Cardiology

## 2014-04-18 NOTE — Progress Notes (Signed)
Occupational Therapy Treatment Patient Details Name: Kent [name] MRN: [id number] DOB: 1941-02-02 Today's Date: 04/18/2014    History of present illness pt is an active 73 y.o. male s/p OPEN REPAIR INCISIONAL HERNIA  on 04/13/14.   OT comments  Education provided. Feel pt is safe for d/c home from OT standpoint with wife available to assist.   Follow Up Recommendations  No OT follow up;Supervision/Assistance - 24 hour    Equipment Recommendations  None recommended by OT    Recommendations for Other Services      Precautions / Restrictions Precautions Precautions: None Required Braces or Orthoses: Other Brace/Splint Other Brace/Splint: abdominal binder Restrictions Weight Bearing Restrictions: No       Mobility Bed Mobility Overal bed mobility: Modified Independent             General bed mobility comments: no physical assist needed. Instructed to not use rail.  Transfers Overall transfer level: Needs assistance Equipment used: Rolling walker (2 wheeled) Transfers: Sit to/from Stand Sit to Stand: Supervision              Balance                                   ADL Overall ADL's : Needs assistance/impaired                     Lower Body Dressing: Set up;Supervision/safety;Sitting/lateral leans (able to cross legs to simulate LB ADLs.)   Toilet Transfer: Supervision/safety;Ambulation;RW (bed)       Tub/ Shower Transfer: Min guard;Ambulation;3 in 1;Rolling walker   Functional mobility during ADLs: Supervision/safety;Min guard;Rolling walker (min guard for shower transfer) General ADL Comments: Pt able to cross legs to perform LB dressing-discussed use of long sponge if needed. Educated on safety (safe shoewear, use of bag on walker, rugs, pets, sitting for most of LB ADLs). Practiced shower transfer and recommended spouse be with him for this. Wife helped pt fasten abdominal binder.      Vision                      Perception     Praxis      Cognition  Awake/Alert Behavior During Therapy: WFL for tasks assessed/performed Overall Cognitive Status: Within Functional Limits for tasks assessed                       Extremity/Trunk Assessment               Exercises     Shoulder Instructions       General Comments      Pertinent Vitals/ Pain       Pain Assessment: 0-10 Pain Score: 6  Pain Location: abdomen Pain Intervention(s): Repositioned  Home Living                                          Prior Functioning/Environment              Frequency Min 2X/week     Progress Toward Goals  OT Goals(current goals can now be found in the care plan section)  Progress towards OT goals: Progressing toward goals  Acute Rehab OT Goals Patient Stated Goal: not stated OT Goal Formulation: With patient Time For Goal Achievement: 04/23/14 Potential to  Achieve Goals: Good ADL Goals Pt Will Transfer to Toilet: with modified independence;ambulating Pt Will Perform Toileting - Clothing Manipulation and hygiene: with modified independence;sit to/from stand Pt Will Perform Tub/Shower Transfer: Shower transfer;with supervision;ambulating;3 in 1;rolling walker Additional ADL Goal #1: Caregiver will be independent with assisting pt with bed mobility as precursor for ADLs-met goal (pt did not require assist)  Plan Discharge plan remains appropriate    Co-evaluation                 End of Session Equipment Utilized During Treatment: Rolling walker; gait belt   Activity Tolerance Patient tolerated treatment well   Patient Left in bed;with call bell/phone within reach;with family/visitor present   Nurse Communication          Time: 4469-5072 OT Time Calculation (min): 11 min  Charges: OT General Charges $OT Visit: 1 Procedure OT Treatments $Self Care/Home Management : 8-22 mins  Benito Mccreedy OTR/L [phone] 04/18/2014, 1:12 PM

## 2014-04-18 NOTE — Progress Notes (Signed)
Patient ID: Kent [name], male   DOB: 1940-06-28, 73 y.o.   MRN: [id number] Able to pass urine. Sore. Ambulated. Plan D/C in AM. Georganna Skeans, MD, MPH, FACS Trauma: [phone] General Surgery: [phone]

## 2014-04-18 NOTE — Progress Notes (Signed)
5 Days Post-Op  Subjective: tol diet, sinuses feeling a bit better, did well with PT  Objective: Vital signs in last 24 hours: Temp:  [97.9 F (36.6 C)-98.2 F (36.8 C)] 97.9 F (36.6 C) (10/27 0525) Pulse Rate:  [75-89] 75 (10/27 0525) Resp:  [17-20] 17 (10/27 0525) BP: (105-133)/(62) 125/62 mmHg (10/27 0525) SpO2:  [87 %-100 %] 98 % (10/27 0525) Last BM Date: 04/17/14  Intake/Output from previous day: 10/26 0701 - 10/27 0700 In: 3122 [P.O.:938; I.V.:2184] Out: 4210 [Urine:3750; Drains:25] Intake/Output this shift:    General appearance: alert and cooperative Resp: clear to auscultation bilaterally Cardio: regular rate and rhythm GI: soft, incision OK, +BS  Lab Results:  No results found for this basename: WBC, HGB, HCT, PLT,  in the last 72 hours BMET  Recent Labs  04/17/14 0006  NA 137  K 4.7  CL 100  CO2 30  GLUCOSE 139*  BUN 7  CREATININE 0.90  CALCIUM 9.0   PT/INR No results found for this basename: LABPROT, INR,  in the last 72 hours ABG No results found for this basename: PHART, PCO2, PO2, HCO3,  in the last 72 hours  Studies/Results: No results found.  Anti-infectives: Anti-infectives   Start     Dose/Rate Route Frequency Ordered Stop   04/17/14 1000  levofloxacin (LEVAQUIN) tablet 500 mg     500 mg Oral Daily 04/17/14 0759 04/22/14 0959   04/13/14 0600  ceFAZolin (ANCEF) IVPB 2 g/50 mL premix     2 g 100 mL/hr over 30 Minutes Intravenous On call to O.R. 04/12/14 1438 04/13/14 0738      Assessment/Plan: POD#5 Urinary retention - on Flomax and Urecholine, Plan D/C foley today SInusitis - Levaquin PT/OT - needs to do stairs VTE - SQ hep DIspo - hope to D/C late today or tomorrow   LOS: 5 days    Colden Samaras E 04/18/2014

## 2014-04-18 NOTE — Care Management Note (Signed)
  Page 1 of 1   04/18/2014     10:53:51 AM CARE MANAGEMENT NOTE 04/18/2014  Patient:  Kent [name], Kent [name]   Account Number:  [id number]  Date Initiated:  04/18/2014  Documentation initiated by:  Magdalen Spatz  Subjective/Objective Assessment:     Action/Plan:   Anticipated DC Date:     Anticipated DC Plan:           Choice offered to / List presented to:             Status of service:   Medicare Important Message given?  YES (If response is "NO", the following Medicare IM given date fields will be blank) Date Medicare IM given:  04/18/2014 Medicare IM given by:  Magdalen Spatz Date Additional Medicare IM given:   Additional Medicare IM given by:    Discharge Disposition:    Per UR Regulation:    If discussed at Long Length of Stay Meetings, dates discussed:   04/18/2014    Comments:

## 2014-04-18 NOTE — Progress Notes (Signed)
Physical Therapy Treatment Patient Details Name: Kent [name] MRN: [id number] DOB: May 01, 1941 Today's Date: 04/18/2014    History of Present Illness pt is an active 73 y.o. male s/p OPEN REPAIR INCISIONAL HERNIA  on 04/13/14.    PT Comments    Patient continues to make good progress with therapy. Able to complete 2 flights of steps. Patient safe to D/C from a mobility standpoint based on progression towards goals set on PT eval. Patient is planning on possible DC later today   Follow Up Recommendations  Home health PT;Supervision/Assistance - 24 hour     Equipment Recommendations  None recommended by PT    Recommendations for Other Services       Precautions / Restrictions Precautions Precautions: None Required Braces or Orthoses: Other Brace/Splint Other Brace/Splint: abdominal binder    Mobility  Bed Mobility Overal bed mobility: Modified Independent                Transfers Overall transfer level: Modified independent                  Ambulation/Gait Ambulation/Gait assistance: Modified independent (Device/Increase time) Ambulation Distance (Feet): 600 Feet Assistive device: Rolling walker (2 wheeled) Gait Pattern/deviations: Step-through pattern         Stairs   Stairs assistance: Min guard Stair Management: One rail Left;One rail Right Number of Stairs: 28 General stair comments: Miguard for safety  Wheelchair Mobility    Modified Rankin (Stroke Patients Only)       Balance                                    Cognition Arousal/Alertness: Awake/alert Behavior During Therapy: WFL for tasks assessed/performed Overall Cognitive Status: Within Functional Limits for tasks assessed                      Exercises      General Comments        Pertinent Vitals/Pain Pain Score: 3  Pain Location: abdomen Pain Descriptors / Indicators: Sore Pain Intervention(s): Monitored during session    Home Living                       Prior Function            PT Goals (current goals can now be found in the care plan section) Progress towards PT goals: Progressing toward goals    Frequency  Min 3X/week    PT Plan Current plan remains appropriate    Co-evaluation             End of Session   Activity Tolerance: Patient tolerated treatment well Patient left: in chair;with call bell/phone within reach;with family/visitor present     Time: 1100-1111 PT Time Calculation (min): 11 min  Charges:  $Gait Training: 8-22 mins                    G Codes:      Jacqualyn Posey 04/18/2014, 11:33 AM 04/18/2014 Jacqualyn Posey PTA [phone] pager [phone] office

## 2014-04-19 MED ORDER — HYDROCODONE-ACETAMINOPHEN 5-325 MG PO TABS: 1.0000 | ORAL_TABLET | ORAL | Status: DC | PRN

## 2014-04-19 MED ORDER — TAMSULOSIN HCL 0.4 MG PO CAPS: 0.4000 mg | ORAL_CAPSULE | Freq: Every day | ORAL | Status: DC

## 2014-04-19 NOTE — Progress Notes (Signed)
D/c to home with wife. D/c instructions given and demonstrated understanding. VSS. Pain denied. Breathing regular and unlabored.

## 2014-04-19 NOTE — Discharge Summary (Signed)
Physician Discharge Summary  Patient ID: Kent [name] MRN: [id number] DOB/AGE: [birth year] 73 y.o.  Admit date: 04/13/2014 Discharge date: 04/19/2014  Admission Diagnoses:incisional hernia  Discharge Diagnoses: S/P repair incisional hernia with mesh Active Problems:   S/P hernia repair   Discharged Condition: good  Hospital Course: Patient underwent open repair of incisional hernia with mesh. Postoperatively, he had a mild ileus. This gradually resolved. He developed urinary retention. This required reinsertion of his Foley catheter. Uric choline and Flomax were started. Foley catheter was subsequently removed with excellent urine output. He had good pain control. Drain output tapered and they were removed. His abdominal binder created some skin irritation so he was wearing that intermittently. He is discharged home in good condition.  Consults: None  Significant Diagnostic Studies: N/A  Treatments: surgery: above  Discharge Exam: Blood pressure 105/42, pulse 77, temperature 98.1 F (36.7 C), temperature source Oral, resp. rate 19, height 5\' 11"  (1.803 m), weight 177 lb 0.5 oz (80.3 kg), SpO2 96.00%. General appearance: alert and cooperative Resp: clear to auscultation bilaterally Cardio: regular rate and rhythm GI: soft, incision CDI, drain site dressings changed - dry  Disposition: 01-Home or Self Care  Discharge Instructions   Diet - low sodium heart healthy    Complete by:  As directed      Discharge instructions    Complete by:  As directed   CC[facility] _______[facility]  UMBILICAL OR INGUINAL HERNIA REPAIR: POST OP INSTRUCTIONS  Always review your discharge instruction sheet given to you by the facility where your surgery was performed. IF YOU HAVE DISABILITY OR FAMILY LEAVE FORMS, YOU MUST BRING THEM TO THE OFFICE FOR PROCESSING.   DO NOT GIVE THEM TO YOUR DOCTOR.  A  prescription for pain medication may be given to you upon discharge.  Take your pain  medication as prescribed, if needed.  If narcotic pain medicine is not needed, then you may take acetaminophen (Tylenol) or ibuprofen (Advil) as needed. Take your usually prescribed medications unless otherwise directed. If you need a refill on your pain medication, please contact your pharmacy.  They will contact our office to request authorization. Prescriptions will not be filled after 5 pm or on week-ends. You should follow a light diet the first 24 hours after arrival home, such as soup and crackers, etc.  Be sure to include lots of fluids daily.  Resume your normal diet the day after surgery. Most patients will experience some swelling and bruising around the umbilicus or in the groin and scrotum.  Ice packs and reclining will help.  Swelling and bruising can take several days to resolve.  It is common to experience some constipation if taking pain medication after surgery.  Increasing fluid intake and taking a stool softener (such as Colace) will usually help or prevent this problem from occurring.  A mild laxative (Milk of Magnesia or Miralax) should be taken according to package directions if there are no bowel movements after 48 hours. Unless discharge instructions indicate otherwise, you may remove your bandages 24-48 hours after surgery, and you may shower at that time.  You may have steri-strips (small skin tapes) in place directly over the incision.  These strips should be left on the skin for 7-10 days.  If your surgeon used skin glue on the incision, you may shower in 24 hours.  The glue will flake off over the next 2-3 weeks.  Any sutures or staples will be removed at the office during your follow-up visit.  ACTIVITIES:  You may resume regular (light) daily activities beginning the next day-such as daily self-care, walking, climbing stairs-gradually increasing activities as tolerated.  You may have sexual intercourse when it is comfortable.  Refrain from any heavy lifting or straining until  approved by your doctor. You may drive when you are no longer taking prescription pain medication, you can comfortably wear a seatbelt, and you can safely maneuver your car and apply brakes. RETURN TO WORK:  __________________________________________________________ Dennis Bast should see your doctor in the office for a follow-up appointment approximately 2-3 weeks after your surgery.  Make sure that you call for this appointment within a day or two after you arrive home to insure a convenient appointment time. OTHER INSTRUCTIONS:  __________________________________________________________________________________________________________________________________________________________________________________________  WHEN TO CALL YOUR DOCTOR: Fever over 101.0 Inability to urinate Nausea and/or vomiting Extreme swelling or bruising Continued bleeding from incision. Increased pain, redness, or drainage from the incision  The clinic staff is available to answer your questions during regular business hours.  Please don't hesitate to call and ask to speak to one of the nurses for clinical concerns.  If you have a medical emergency, go to the nearest emergency room or call 911.  A surgeon from [facility] is always on call at the hospital   [address] [place], [place], [place]  [address] ?  [address], [place], [place]   [address] [phone] ? [phone] ? FAX [phone] Web site: [email or web address]     Discharge wound care:    Complete by:  As directed   Remove gauzes next time you shower and may leave open after. Wear binder when up and around.     Increase activity slowly    Complete by:  As directed             Medication List    STOP taking these medications       saccharomyces boulardii 250 MG capsule  Commonly known as:  FLORASTOR      TAKE these medications       gabapentin 300 MG capsule  Commonly known as:  NEURONTIN  Take 600 mg by  mouth every 8 (eight) hours.     GAS-X PO  Take 2 tablets by mouth every 6 (six) hours as needed (gas).     HYDROcodone-acetaminophen 5-325 MG per tablet  Commonly known as:  NORCO/VICODIN  Take 1 tablet by mouth every 4 (four) hours as needed (pain).     methocarbamol 500 MG tablet  Commonly known as:  ROBAXIN  Take 1,000 mg by mouth 2 (two) times daily.     omeprazole 40 MG capsule  Commonly known as:  PRILOSEC  Take 40 mg by mouth daily.     oxyCODONE 20 MG 12 hr tablet  Commonly known as:  OXYCONTIN  Take 20 mg by mouth every 12 (twelve) hours.     psyllium 95 % Pack  Commonly known as:  HYDROCIL/METAMUCIL  Take 1 packet by mouth daily.     tamsulosin 0.4 MG Caps capsule  Commonly known as:  FLOMAX  Take 1 capsule (0.4 mg total) by mouth daily.     TRUNATURE DIGESTIVE PROBIOTIC Caps  Take 1 capsule by mouth daily.           Follow-up Information   Schedule an appointment as soon as possible for a visit with Zenovia Jarred, MD. (My office will call you to make an appointment)    Specialty:  General Surgery   Contact  information:   [address] [place] [address] [phone]       Signed: Zenovia Jarred 04/19/2014, 7:48 AM

## 2014-06-30 ENCOUNTER — Encounter (INDEPENDENT_AMBULATORY_CARE_PROVIDER_SITE_OTHER): Payer: Medicare Other | Admitting: Ophthalmology

## 2014-06-30 DIAGNOSIS — H2513 Age-related nuclear cataract, bilateral: Secondary | ICD-10-CM

## 2014-06-30 DIAGNOSIS — H43813 Vitreous degeneration, bilateral: Secondary | ICD-10-CM

## 2014-08-28 ENCOUNTER — Other Ambulatory Visit: Payer: Self-pay | Admitting: Gastroenterology

## 2014-08-28 DIAGNOSIS — R1084 Generalized abdominal pain: Secondary | ICD-10-CM

## 2014-09-01 ENCOUNTER — Ambulatory Visit
Admission: RE | Admit: 2014-09-01 | Discharge: 2014-09-01 | Disposition: A | Discharge status: Home / Self Care | Payer: Medicare Other | Source: Ambulatory Visit | Attending: Gastroenterology | Admitting: Gastroenterology

## 2014-09-01 DIAGNOSIS — R1084 Generalized abdominal pain: Secondary | ICD-10-CM

## 2014-09-01 MED ORDER — IOPAMIDOL (ISOVUE-300) INJECTION 61%
100.0000 mL | Freq: Once | INTRAVENOUS | Status: AC | PRN
  Administered 2014-09-01: 100 mL via INTRAVENOUS

## 2014-09-07 ENCOUNTER — Other Ambulatory Visit: Payer: Self-pay | Admitting: Gastroenterology

## 2014-10-04 ENCOUNTER — Emergency Department (HOSPITAL_COMMUNITY): Payer: Medicare Other

## 2014-10-04 ENCOUNTER — Emergency Department (HOSPITAL_COMMUNITY)
Admission: EM | Admit: 2014-10-04 | Discharge: 2014-10-04 | Disposition: A | Discharge status: Home / Self Care | Payer: Medicare Other | Attending: Emergency Medicine | Admitting: Emergency Medicine

## 2014-10-04 ENCOUNTER — Encounter (HOSPITAL_COMMUNITY): Payer: Self-pay | Admitting: *Deleted

## 2014-10-04 DIAGNOSIS — Z79899 Other long term (current) drug therapy: Secondary | ICD-10-CM | POA: Insufficient documentation

## 2014-10-04 DIAGNOSIS — K8080 Other cholelithiasis without obstruction: Secondary | ICD-10-CM | POA: Insufficient documentation

## 2014-10-04 DIAGNOSIS — K219 Gastro-esophageal reflux disease without esophagitis: Secondary | ICD-10-CM | POA: Insufficient documentation

## 2014-10-04 DIAGNOSIS — M1990 Unspecified osteoarthritis, unspecified site: Secondary | ICD-10-CM | POA: Diagnosis not present

## 2014-10-04 DIAGNOSIS — R109 Unspecified abdominal pain: Secondary | ICD-10-CM | POA: Diagnosis present

## 2014-10-04 DIAGNOSIS — G8929 Other chronic pain: Secondary | ICD-10-CM | POA: Diagnosis not present

## 2014-10-04 DIAGNOSIS — Z7952 Long term (current) use of systemic steroids: Secondary | ICD-10-CM | POA: Diagnosis not present

## 2014-10-04 DIAGNOSIS — Z9889 Other specified postprocedural states: Secondary | ICD-10-CM | POA: Diagnosis not present

## 2014-10-04 DIAGNOSIS — K8020 Calculus of gallbladder without cholecystitis without obstruction: Secondary | ICD-10-CM

## 2014-10-04 LAB — URINALYSIS, ROUTINE W REFLEX MICROSCOPIC
BILIRUBIN URINE: NEGATIVE
GLUCOSE, UA: NEGATIVE mg/dL
Hgb urine dipstick: NEGATIVE
Ketones, ur: NEGATIVE mg/dL
Leukocytes, UA: NEGATIVE
Nitrite: NEGATIVE
PH: 6 (ref 5.0–8.0)
Protein, ur: NEGATIVE mg/dL
SPECIFIC GRAVITY, URINE: 1.024 (ref 1.005–1.030)
Urobilinogen, UA: 0.2 mg/dL (ref 0.0–1.0)

## 2014-10-04 LAB — COMPREHENSIVE METABOLIC PANEL
ALK PHOS: 60 U/L (ref 39–117)
ALT: 23 U/L (ref 0–53)
ANION GAP: 8 (ref 5–15)
AST: 24 U/L (ref 0–37)
Albumin: 4 g/dL (ref 3.5–5.2)
BILIRUBIN TOTAL: 0.9 mg/dL (ref 0.3–1.2)
BUN: 12 mg/dL (ref 6–23)
CHLORIDE: 102 mmol/L (ref 96–112)
CO2: 28 mmol/L (ref 19–32)
Calcium: 9.6 mg/dL (ref 8.4–10.5)
Creatinine, Ser: 1 mg/dL (ref 0.50–1.35)
GFR calc Af Amer: 85 mL/min — ABNORMAL LOW (ref >90)
GFR calc non Af Amer: 74 mL/min — ABNORMAL LOW (ref >90)
Glucose, Bld: 109 mg/dL — ABNORMAL HIGH (ref 70–99)
POTASSIUM: 4.3 mmol/L (ref 3.5–5.1)
Sodium: 138 mmol/L (ref 135–145)
TOTAL PROTEIN: 7.7 g/dL (ref 6.0–8.3)

## 2014-10-04 LAB — CBC WITH DIFFERENTIAL/PLATELET
BASOS ABS: 0 10*3/uL (ref 0.0–0.1)
BASOS PCT: 1 % (ref 0–1)
EOS PCT: 2 % (ref 0–5)
Eosinophils Absolute: 0.1 10*3/uL (ref 0.0–0.7)
HCT: 39 % (ref 39.0–52.0)
Hemoglobin: 13.1 g/dL (ref 13.0–17.0)
Lymphocytes Relative: 26 % (ref 12–46)
Lymphs Abs: 1.2 10*3/uL (ref 0.7–4.0)
MCH: 29.2 pg (ref 26.0–34.0)
MCHC: 33.6 g/dL (ref 30.0–36.0)
MCV: 87.1 fL (ref 78.0–100.0)
MONO ABS: 0.4 10*3/uL (ref 0.1–1.0)
Monocytes Relative: 9 % (ref 3–12)
Neutro Abs: 3 10*3/uL (ref 1.7–7.7)
Neutrophils Relative %: 62 % (ref 43–77)
Platelets: 191 10*3/uL (ref 150–400)
RBC: 4.48 MIL/uL (ref 4.22–5.81)
RDW: 13.5 % (ref 11.5–15.5)
WBC: 4.8 10*3/uL (ref 4.0–10.5)

## 2014-10-04 LAB — LIPASE, BLOOD: LIPASE: 19 U/L (ref 11–59)

## 2014-10-04 MED ORDER — SODIUM CHLORIDE 0.9 % IV BOLUS (SEPSIS)
1000.0000 mL | Freq: Once | INTRAVENOUS | Status: AC
  Administered 2014-10-04: 1000 mL via INTRAVENOUS

## 2014-10-04 MED ORDER — MORPHINE SULFATE 4 MG/ML IJ SOLN
4.0000 mg | Freq: Once | INTRAMUSCULAR | Status: AC
  Administered 2014-10-04: 4 mg via INTRAVENOUS
  Filled 2014-10-04: qty 1

## 2014-10-04 MED ORDER — IOHEXOL 300 MG/ML  SOLN
25.0000 mL | Freq: Once | INTRAMUSCULAR | Status: AC | PRN
  Administered 2014-10-04: 25 mL via ORAL

## 2014-10-04 MED ORDER — ONDANSETRON HCL 4 MG/2ML IJ SOLN
4.0000 mg | Freq: Once | INTRAMUSCULAR | Status: AC
  Administered 2014-10-04: 4 mg via INTRAVENOUS
  Filled 2014-10-04: qty 2

## 2014-10-04 MED ORDER — IOHEXOL 300 MG/ML  SOLN
100.0000 mL | Freq: Once | INTRAMUSCULAR | Status: AC | PRN
  Administered 2014-10-04: 100 mL via INTRAVENOUS

## 2014-10-04 NOTE — ED Notes (Signed)
Pt reports having hernia repair in OCT, now having increase in mid abd pain, radiates to right side of abd. Denies vomiting or diarrhea.

## 2014-10-04 NOTE — ED Provider Notes (Signed)
CSN: [id number]     Arrival date & time 10/04/14  1408 History   First MD Initiated Contact with Patient 10/04/14 1749     Chief Complaint  Patient presents with  . Abdominal Pain     (Consider location/radiation/quality/duration/timing/severity/associated sxs/prior Treatment) HPI  74 year old male presents with worse lower abdominal pain that started this morning. For the past 2 months she's been having pain, especially on the right side. On 4/25 he is scheduled to have a laparoscopic cholecystectomy by Dr. Grandville Silos. He states that this is the pain that is distributed to his gallbladder and is worse today than typical. He takes hydrocodone and OxyContin at home. This has been unable to relieve the pain. Having nausea without vomiting. No fevers. Pain also in his lower abdomen as well. Feels like his lower abdomen as distending. Has had hernias as well as small bowel traction before but states this feels different. Last bowel movement yesterday. Rates his pain as a 10/10.  Past Medical History  Diagnosis Date  . 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 Chronic back pain     s/p back surgery  . GERD (gastroesophageal reflux disease)   . Incisional hernia   . 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 GERD (gastroesophageal reflux disease) 08/18/2013  . Sinus infection     currently on antibiotics   Past Surgical History  Procedure Laterality Date  . Colon surgery  04/2002    Dr. Deon Pilling - Subtotal colectomy, ileostomy  . Back surgery  08/25/2001    Neck 3 disk replacements  . Incision and drainage foot    . Knee arthroscopy  2010  . Shoulder arthroscopy Bilateral   . Laparotomy N/A 01/20/2013    Procedure: EXPLORATORY LAPAROTOMY;  Surgeon: Zenovia Jarred, MD;  Location: [place];  Service: General;  Laterality: N/A;  . Lysis of adhesion N/A 01/20/2013    Procedure: LYSIS OF ADHESION;  Surgeon: Zenovia Jarred, MD;  Location: [place];  Service:  General;  Laterality: N/A;  . Incisional hernia repair N/A 01/20/2013    Procedure: HERNIA REPAIR INCISIONAL;  Surgeon: Zenovia Jarred, MD;  Location: [place];  Service: General;  Laterality: N/A;  . Ileostomy takedown  08/2002    Dr. Deon Pilling  . Laparotomy with lysis of adhesions  2010    Dr. Grandville Silos - [facility]  . Back surgery      Placement/removal of nerve stimulator  . Microsurgical  03/24/2002    Lateral recess decompression L5  . Sacral nerve stimulator placement      inserted and removed  . Incisional hernia repair  04/13/2014    with mesh    dr Grandville Silos  . Incisional hernia repair N/A 04/13/2014    Procedure: OPEN REPAIR INCISIONAL HERNIA ;  Surgeon: Georganna Skeans, MD;  Location: [place];  Service: General;  Laterality: N/A;  . Insertion of mesh N/A 04/13/2014    Procedure: INSERTION OF MESH;  Surgeon: Georganna Skeans, MD;  Location: [place];  Service: General;  Laterality: N/A;   History reviewed. No pertinent family history. History  Substance Use Topics  . Smoking status: Never Smoker   . Smokeless tobacco: Never Used  . Alcohol Use: No    Review of Systems  Constitutional: Negative for fever.  Cardiovascular: Negative for chest pain.  Gastrointestinal: Positive for nausea, abdominal pain and abdominal distention. Negative for vomiting, diarrhea and constipation.  Genitourinary: Negative for dysuria.  Musculoskeletal: Positive for back pain (chronic).  All other systems reviewed and are negative.     Allergies  Review of patient's allergies indicates no known allergies.  Home Medications   Prior to Admission medications   Medication Sig Start Date End Date Taking? Authorizing Provider  gabapentin (NEURONTIN) 300 MG capsule Take 600 mg by mouth every 8 (eight) hours.  12/26/13  Yes Historical Provider, MD  HYDROcodone-acetaminophen (NORCO/VICODIN) 5-325 MG per tablet Take 1 tablet by mouth every 4 (four) hours as needed (pain). 04/19/14  Yes Georganna Skeans, MD   methocarbamol (ROBAXIN) 500 MG tablet Take 1,000 mg by mouth 2 (two) times daily.    Yes Historical Provider, MD  omeprazole (PRILOSEC) 40 MG capsule Take 40 mg by mouth daily.   Yes Historical Provider, MD  oxyCODONE (OXYCONTIN) 20 MG 12 hr tablet Take 20 mg by mouth every 12 (twelve) hours.   Yes Historical Provider, MD  Simethicone (GAS-X PO) Take 2 tablets by mouth every 6 (six) hours as needed (gas).   Yes Historical Provider, MD  psyllium (HYDROCIL/METAMUCIL) 95 % PACK Take 1 packet by mouth daily. Patient not taking: Reported on 10/04/2014 08/20/13   Earnstine Regal, PA-C  tamsulosin (FLOMAX) 0.4 MG CAPS capsule Take 1 capsule (0.4 mg total) by mouth daily. Patient not taking: Reported on 10/04/2014 04/19/14   Georganna Skeans, MD   BP 129/65 mmHg  Pulse 74  Temp(Src) 98.1 F (36.7 C) (Oral)  Resp 18  Ht 5\' 11"  (1.803 m)  Wt 159 lb (72.122 kg)  BMI 22.19 kg/m2  SpO2 100% Physical Exam  Constitutional: He is oriented to person, place, and time. He appears well-developed and well-nourished.  HENT:  Head: Normocephalic and atraumatic.  Right Ear: External ear normal.  Left Ear: External ear normal.  Nose: Nose normal.  Eyes: Right eye exhibits no discharge. Left eye exhibits no discharge.  Neck: Neck supple.  Cardiovascular: Normal rate, regular rhythm, normal heart sounds and intact distal pulses.   Pulmonary/Chest: Effort normal.  Abdominal: Soft. There is tenderness. There is no rigidity, no rebound and no CVA tenderness.  Diffuse abdominal tenderness, worst in RLQ, LLQ, suprapubic. Mild tenderness to RUQ, LUQ. Distention to lower abdomen. Well healed midline incisional scar  Musculoskeletal: He exhibits no edema.  Neurological: He is alert and oriented to person, place, and time.  Skin: Skin is warm and dry.  Nursing note and vitals reviewed.   ED Course  Procedures (including critical care time) Labs Review Labs Reviewed  COMPREHENSIVE METABOLIC PANEL - Abnormal;  Notable for the following:    Glucose, Bld 109 (*)    GFR calc non Af Amer 74 (*)    GFR calc Af Amer 85 (*)    All other components within normal limits  CBC WITH DIFFERENTIAL/PLATELET  LIPASE, BLOOD  URINALYSIS, ROUTINE W REFLEX MICROSCOPIC    Imaging Review Ct Abdomen Pelvis W Contrast  10/04/2014   CLINICAL DATA:  Mid abdominal pain radiating toward right  EXAM: CT ABDOMEN AND PELVIS WITH CONTRAST  TECHNIQUE: Multidetector CT imaging of the abdomen and pelvis was performed using the standard protocol following bolus administration of intravenous contrast. Oral contrast was also administered.  CONTRAST:  132mL OMNIPAQUE IOHEXOL 300 MG/ML  SOLN  COMPARISON:  September 01, 2014  FINDINGS: Lung bases are clear. 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  Liver is prominent, measuring 16.0 cm in length. There is hepatic steatosis. No  focal liver lesions are identified. There is cholelithiasis. There is also sludge in the gallbladder. The gallbladder wall does not appear appreciably thickened. There is no biliary duct dilatation.  Spleen, pancreas, and adrenals appear normal. Kidneys bilaterally show no mass or hydronephrosis on either side. There is no renal or ureteral calculus on either side.  In the pelvis, urinary bladder is mildly distended. Urinary bladder wall is not appreciably thickened. There is a calcification in the prostate. There is no pelvic mass or pelvic fluid collection.  There is postoperative change in the mid abdomen with thinning of the rectus muscle in the midline. There is scarring in the anterior abdominal wall in the area of prior surgery. The patient has had a portion of the right colon removed. There is no bowel obstruction. No free air or portal venous air. There is no demonstrable ascites, adenopathy, or abscess in the abdomen or pelvis. Portions of the proximal sigmoid colon are mildly distended with stool. There is atherosclerotic  change in the aorta but no aneurysm. There are no blastic or lytic bone lesions.  IMPRESSION: Areas of postoperative change without bowel obstruction or abscess. No inflammatory change.  Sludge and cholelithiasis in the gallbladder.  Liver prominent with hepatic steatosis.  No renal or ureteral calculus.  No hydronephrosis.   Electronically Signed   By: Lowella Grip III M.D.   On: 10/04/2014 20:42     EKG Interpretation None      MDM   Final diagnoses:  Abdominal pain, unspecified abdominal location  Symptomatic cholelithiasis    Patient's pain is better with IV morphine. Lab work is unremarkable including normal LFTs. CT shows no acute abnormalities, does have known sludge and cholelithiasis and gallbladder without obvious cholecystitis. Given no fever or elevated white blood cell count I doubt he has acute cholecystitis. Seems to be related to his pain that he's had for the last 2 months. Discussed with Dr. Hulen Skains of surgery, who states if he does not have cholecystitis he should be discharge and follow-up with his surgeon, Dr. Grandville Silos.    Sherwood Gambler, MD 10/05/14 [phone]

## 2014-10-04 NOTE — Discharge Instructions (Signed)
Abdominal Pain Many things can cause abdominal pain. Usually, abdominal pain is not caused by a disease and will improve without treatment. It can often be observed and treated at home. Your health care provider will do a physical exam and possibly order blood tests and X-rays to help determine the seriousness of your pain. However, in many cases, more time must pass before a clear cause of the pain can be found. Before that point, your health care provider may not know if you need more testing or further treatment. HOME CARE INSTRUCTIONS  Monitor your abdominal pain for any changes. The following actions may help to alleviate any discomfort you are experiencing:  Only take over-the-counter or prescription medicines as directed by your health care provider.  Do not take laxatives unless directed to do so by your health care provider.  Try a clear liquid diet (broth, tea, or water) as directed by your health care provider. Slowly move to a bland diet as tolerated. SEEK MEDICAL CARE IF:  You have unexplained abdominal pain.  You have abdominal pain associated with nausea or diarrhea.  You have pain when you urinate or have a bowel movement.  You experience abdominal pain that wakes you in the night.  You have abdominal pain that is worsened or improved by eating food.  You have abdominal pain that is worsened with eating fatty foods.  You have a fever. SEEK IMMEDIATE MEDICAL CARE IF:   Your pain does not go away within 2 hours.  You keep throwing up (vomiting).  Your pain is felt only in portions of the abdomen, such as the right side or the left lower portion of the abdomen.  You pass bloody or black tarry stools. MAKE SURE YOU:  Understand these instructions.   Will watch your condition.   Will get help right away if you are not doing well or get worse.  Document Released: 03/19/2005 Document Revised: 06/14/2013 Document Reviewed: 02/16/2013 [facility] Patient Information  2015 [place], [place]. This information is not intended to replace advice given to you by your health care provider. Make sure you discuss any questions you have with your health care provider.     Cholelithiasis Cholelithiasis (also called gallstones) is a form of gallbladder disease in which gallstones form in your gallbladder. The gallbladder is an organ that stores bile made in the liver, which helps digest fats. Gallstones begin as small crystals and slowly grow into stones. Gallstone pain occurs when the gallbladder spasms and a gallstone is blocking the duct. Pain can also occur when a stone passes out of the duct.  RISK FACTORS  Being male.   Having multiple pregnancies. Health care providers sometimes advise removing diseased gallbladders before future pregnancies.   Being obese.  Eating a diet heavy in fried foods and fat.   Being older than 86 years and increasing age.   Prolonged use of medicines containing male hormones.   Having diabetes mellitus.   Rapidly losing weight.   Having a family history of gallstones (heredity).  SYMPTOMS  Nausea.   Vomiting.  Abdominal pain.   Yellowing of the skin (jaundice).   Sudden pain. It may persist from several minutes to several hours.  Fever.   Tenderness to the touch. In some cases, when gallstones do not move into the bile duct, people have no pain or symptoms. These are called "silent" gallstones.  TREATMENT Silent gallstones do not need treatment. In severe cases, emergency surgery may be required. Options for treatment include:  Surgery to remove the gallbladder. This is the most common treatment.  Medicines. These do not always work and may take 6-12 months or more to work.  Shock wave treatment (extracorporeal biliary lithotripsy). In this treatment an ultrasound machine sends shock waves to the gallbladder to break gallstones into smaller pieces that can pass into the intestines or be  dissolved by medicine. HOME CARE INSTRUCTIONS   Only take over-the-counter or prescription medicines for pain, discomfort, or fever as directed by your health care provider.   Follow a low-fat diet until seen again by your health care provider. Fat causes the gallbladder to contract, which can result in pain.   Follow up with your health care provider as directed. Attacks are almost always recurrent and surgery is usually required for permanent treatment.  SEEK IMMEDIATE MEDICAL CARE IF:   Your pain increases and is not controlled by medicines.   You have a fever or persistent symptoms for more than 2-3 days.   You have a fever and your symptoms suddenly get worse.   You have persistent nausea and vomiting.  MAKE SURE YOU:   Understand these instructions.  Will watch your condition.  Will get help right away if you are not doing well or get worse. Document Released: 06/05/2005 Document Revised: 02/09/2013 Document Reviewed: 12/01/2012 [facility] Patient Information 2015 [place], [place]. This information is not intended to replace advice given to you by your health care provider. Make sure you discuss any questions you have with your health care provider.

## 2014-10-05 ENCOUNTER — Ambulatory Visit: Payer: Self-pay | Admitting: General Surgery

## 2014-10-06 ENCOUNTER — Other Ambulatory Visit (HOSPITAL_COMMUNITY): Payer: Self-pay | Admitting: *Deleted

## 2014-10-06 NOTE — Pre-Procedure Instructions (Signed)
Kent [name]  10/06/2014   Your procedure is scheduled on:  Monday, October 16, 2014 at 7:30 AM.   Report to [facility] Entrance "A" Admitting Office at 5:30 AM.   Call this number if you have problems the morning of surgery: [phone]               Any questions prior to day of surgery, please call [phone] between 8 & 4 PM.   Remember:   Do not eat food or drink liquids after midnight Sunday, 10/15/14.   Take these medicines the morning of surgery with A SIP OF WATER: Gabapentin (Neurontin), Omeprazole (Prilosec), Oxycodone (Oxycontin)   Do not wear jewelry.  Do not wear lotions, powders, or cologne. You may wear deodorant.  Men may shave face and neck.  Do not bring valuables to the hospital.  [facility] is not responsible                  for any belongings or valuables.               Contacts, dentures or bridgework may not be worn into surgery.  Leave suitcase in the car. After surgery it may be brought to your room.  For patients admitted to the hospital, discharge time is determined by your                treatment team.                             Special Instructions: [place] - Preparing for Surgery  Before surgery, you can play an important role.  Because skin is not sterile, your skin needs to be as free of germs as possible.  You can reduce the number of germs on you skin by washing with CHG (chlorahexidine gluconate) soap before surgery.  CHG is an antiseptic cleaner which kills germs and bonds with the skin to continue killing germs even after washing.  Please DO NOT use if you have an allergy to CHG or antibacterial soaps.  If your skin becomes reddened/irritated stop using the CHG and inform your nurse when you arrive at Short Stay.  Do not shave (including legs and underarms) for at least 48 hours prior to the first CHG shower.  You may shave your face.  Please follow these instructions carefully:   1.  Shower with CHG Soap the night before  surgery and the                                morning of Surgery.  2.  If you choose to wash your hair, wash your hair first as usual with your       normal shampoo.  3.  After you shampoo, rinse your hair and body thoroughly to remove the                      Shampoo.  4.  Use CHG as you would any other liquid soap.  You can apply chg directly       to the skin and wash gently with scrungie or a clean washcloth.  5.  Apply the CHG Soap to your body ONLY FROM THE NECK DOWN.        Do not use on open wounds or open sores.  Avoid contact with your eyes, ears, mouth and  genitals (private parts).  Wash genitals (private parts) with your normal soap.  6.  Wash thoroughly, paying special attention to the area where your surgery        will be performed.  7.  Thoroughly rinse your body with warm water from the neck down.  8.  DO NOT shower/wash with your normal soap after using and rinsing off       the CHG Soap.  9.  Pat yourself dry with a clean towel.            10.  Wear clean pajamas.            11.  Place clean sheets on your bed the night of your first shower and do not        sleep with pets.  Day of Surgery  Do not apply any lotions the morning of surgery.  Please wear clean clothes to the hospital.     Please read over the following fact sheets that you were given: Pain Booklet, Coughing and Deep Breathing and Surgical Site Infection Prevention

## 2014-10-09 ENCOUNTER — Encounter (HOSPITAL_COMMUNITY)
Admission: RE | Admit: 2014-10-09 | Discharge: 2014-10-09 | Disposition: A | Discharge status: Home / Self Care | Payer: Medicare Other | Source: Ambulatory Visit | Attending: General Surgery | Admitting: General Surgery

## 2014-10-09 ENCOUNTER — Encounter (HOSPITAL_COMMUNITY): Payer: Self-pay

## 2014-10-09 DIAGNOSIS — R262 Difficulty in walking, not elsewhere classified: Secondary | ICD-10-CM | POA: Diagnosis not present

## 2014-10-09 DIAGNOSIS — R42 Dizziness and giddiness: Secondary | ICD-10-CM | POA: Diagnosis not present

## 2014-10-09 DIAGNOSIS — K219 Gastro-esophageal reflux disease without esophagitis: Secondary | ICD-10-CM | POA: Diagnosis not present

## 2014-10-09 DIAGNOSIS — K8020 Calculus of gallbladder without cholecystitis without obstruction: Secondary | ICD-10-CM | POA: Diagnosis present

## 2014-10-09 DIAGNOSIS — Z87891 Personal history of nicotine dependence: Secondary | ICD-10-CM | POA: Diagnosis not present

## 2014-10-09 DIAGNOSIS — K8010 Calculus of gallbladder with chronic cholecystitis without obstruction: Secondary | ICD-10-CM | POA: Diagnosis not present

## 2014-10-09 HISTORY — DX: Malignant (primary) neoplasm, unspecified: C80.1

## 2014-10-09 LAB — COMPREHENSIVE METABOLIC PANEL
ALBUMIN: 4 g/dL (ref 3.5–5.2)
ALT: 17 U/L (ref 0–53)
ANION GAP: 8 (ref 5–15)
AST: 18 U/L (ref 0–37)
Alkaline Phosphatase: 59 U/L (ref 39–117)
BUN: 10 mg/dL (ref 6–23)
CALCIUM: 9.2 mg/dL (ref 8.4–10.5)
CO2: 28 mmol/L (ref 19–32)
Chloride: 100 mmol/L (ref 96–112)
Creatinine, Ser: 1.11 mg/dL (ref 0.50–1.35)
GFR calc Af Amer: 75 mL/min — ABNORMAL LOW (ref >90)
GFR calc non Af Amer: 65 mL/min — ABNORMAL LOW (ref >90)
Glucose, Bld: 102 mg/dL — ABNORMAL HIGH (ref 70–99)
Potassium: 4.5 mmol/L (ref 3.5–5.1)
SODIUM: 136 mmol/L (ref 135–145)
TOTAL PROTEIN: 7.2 g/dL (ref 6.0–8.3)
Total Bilirubin: 0.8 mg/dL (ref 0.3–1.2)

## 2014-10-09 LAB — CBC WITH DIFFERENTIAL/PLATELET
BASOS ABS: 0.1 10*3/uL (ref 0.0–0.1)
BASOS PCT: 1 % (ref 0–1)
Eosinophils Absolute: 0.2 10*3/uL (ref 0.0–0.7)
Eosinophils Relative: 3 % (ref 0–5)
HCT: 37.8 % — ABNORMAL LOW (ref 39.0–52.0)
Hemoglobin: 12.7 g/dL — ABNORMAL LOW (ref 13.0–17.0)
LYMPHS PCT: 27 % (ref 12–46)
Lymphs Abs: 1.3 10*3/uL (ref 0.7–4.0)
MCH: 29.5 pg (ref 26.0–34.0)
MCHC: 33.6 g/dL (ref 30.0–36.0)
MCV: 87.9 fL (ref 78.0–100.0)
MONO ABS: 0.4 10*3/uL (ref 0.1–1.0)
Monocytes Relative: 7 % (ref 3–12)
NEUTROS ABS: 2.9 10*3/uL (ref 1.7–7.7)
Neutrophils Relative %: 62 % (ref 43–77)
PLATELETS: 176 10*3/uL (ref 150–400)
RBC: 4.3 MIL/uL (ref 4.22–5.81)
RDW: 13.5 % (ref 11.5–15.5)
WBC: 4.8 10*3/uL (ref 4.0–10.5)

## 2014-10-15 MED ORDER — CEFAZOLIN SODIUM-DEXTROSE 2-3 GM-% IV SOLR
2.0000 g | INTRAVENOUS | Status: AC
  Administered 2014-10-16: 2 g via INTRAVENOUS

## 2014-10-15 MED ORDER — CHLORHEXIDINE GLUCONATE 4 % EX LIQD
1.0000 "application " | Freq: Once | CUTANEOUS | Status: DC
  Filled 2014-10-15: qty 15

## 2014-10-16 ENCOUNTER — Ambulatory Visit (HOSPITAL_COMMUNITY): Payer: Medicare Other | Admitting: Anesthesiology

## 2014-10-16 ENCOUNTER — Encounter (HOSPITAL_COMMUNITY)
Admission: RE | Disposition: A | Discharge status: Home / Self Care | Payer: Self-pay | Source: Ambulatory Visit | Attending: General Surgery

## 2014-10-16 ENCOUNTER — Encounter (HOSPITAL_COMMUNITY): Payer: Self-pay | Admitting: *Deleted

## 2014-10-16 ENCOUNTER — Observation Stay (HOSPITAL_COMMUNITY)
Admission: RE | Admit: 2014-10-16 | Discharge: 2014-10-17 | Disposition: A | Discharge status: Home / Self Care | Payer: Medicare Other | Source: Ambulatory Visit | Attending: General Surgery | Admitting: General Surgery

## 2014-10-16 DIAGNOSIS — K8020 Calculus of gallbladder without cholecystitis without obstruction: Secondary | ICD-10-CM | POA: Diagnosis present

## 2014-10-16 DIAGNOSIS — R262 Difficulty in walking, not elsewhere classified: Secondary | ICD-10-CM | POA: Diagnosis not present

## 2014-10-16 DIAGNOSIS — K219 Gastro-esophageal reflux disease without esophagitis: Secondary | ICD-10-CM | POA: Diagnosis not present

## 2014-10-16 DIAGNOSIS — R42 Dizziness and giddiness: Secondary | ICD-10-CM | POA: Insufficient documentation

## 2014-10-16 DIAGNOSIS — Z9049 Acquired absence of other specified parts of digestive tract: Secondary | ICD-10-CM

## 2014-10-16 DIAGNOSIS — Z87891 Personal history of nicotine dependence: Secondary | ICD-10-CM | POA: Diagnosis not present

## 2014-10-16 DIAGNOSIS — K8010 Calculus of gallbladder with chronic cholecystitis without obstruction: Secondary | ICD-10-CM | POA: Diagnosis not present

## 2014-10-16 HISTORY — PX: CHOLECYSTECTOMY: SHX55

## 2014-10-16 HISTORY — PX: LAPAROSCOPIC CHOLECYSTECTOMY: SUR755

## 2014-10-16 LAB — CBC
HCT: 37.6 % — ABNORMAL LOW (ref 39.0–52.0)
HEMOGLOBIN: 12.5 g/dL — AB (ref 13.0–17.0)
MCH: 29.1 pg (ref 26.0–34.0)
MCHC: 33.2 g/dL (ref 30.0–36.0)
MCV: 87.6 fL (ref 78.0–100.0)
Platelets: 167 10*3/uL (ref 150–400)
RBC: 4.29 MIL/uL (ref 4.22–5.81)
RDW: 13.7 % (ref 11.5–15.5)
WBC: 7.1 10*3/uL (ref 4.0–10.5)

## 2014-10-16 LAB — CREATININE, SERUM
CREATININE: 1.25 mg/dL (ref 0.50–1.35)
GFR calc Af Amer: 65 mL/min — ABNORMAL LOW (ref >90)
GFR, EST NON AFRICAN AMERICAN: 56 mL/min — AB (ref >90)

## 2014-10-16 SURGERY — LAPAROSCOPIC CHOLECYSTECTOMY
Anesthesia: General | Site: Abdomen

## 2014-10-16 MED ORDER — ONDANSETRON HCL 4 MG/2ML IJ SOLN: 4.0000 mg | Freq: Four times a day (QID) | INTRAMUSCULAR | Status: DC | PRN

## 2014-10-16 MED ORDER — GABAPENTIN 300 MG PO CAPS
600.0000 mg | ORAL_CAPSULE | Freq: Three times a day (TID) | ORAL | Status: DC
  Administered 2014-10-16 – 2014-10-17 (×4): 600 mg via ORAL
  Filled 2014-10-16 (×4): qty 2

## 2014-10-16 MED ORDER — KCL IN DEXTROSE-NACL 20-5-0.45 MEQ/L-%-% IV SOLN
INTRAVENOUS | Status: DC
  Administered 2014-10-16: 11:00:00 via INTRAVENOUS
  Filled 2014-10-16 (×3): qty 1000

## 2014-10-16 MED ORDER — SODIUM CHLORIDE 0.9 % IR SOLN
Status: DC | PRN
  Administered 2014-10-16: 1000 mL

## 2014-10-16 MED ORDER — HYDROMORPHONE HCL 1 MG/ML IJ SOLN
INTRAMUSCULAR | Status: AC
  Filled 2014-10-16: qty 1

## 2014-10-16 MED ORDER — LACTATED RINGERS IV SOLN
INTRAVENOUS | Status: DC | PRN
  Administered 2014-10-16 (×2): via INTRAVENOUS

## 2014-10-16 MED ORDER — KETOROLAC TROMETHAMINE 30 MG/ML IJ SOLN
30.0000 mg | Freq: Once | INTRAMUSCULAR | Status: AC | PRN
  Administered 2014-10-16: 30 mg via INTRAVENOUS

## 2014-10-16 MED ORDER — NEOSTIGMINE METHYLSULFATE 10 MG/10ML IV SOLN
INTRAVENOUS | Status: AC
  Filled 2014-10-16: qty 1

## 2014-10-16 MED ORDER — ROCURONIUM BROMIDE 100 MG/10ML IV SOLN
INTRAVENOUS | Status: DC | PRN
  Administered 2014-10-16: 40 mg via INTRAVENOUS

## 2014-10-16 MED ORDER — FENTANYL CITRATE (PF) 250 MCG/5ML IJ SOLN
INTRAMUSCULAR | Status: AC
Start: 2014-10-16 — End: 2014-10-16
  Filled 2014-10-16: qty 5

## 2014-10-16 MED ORDER — SODIUM CHLORIDE 0.9 % IV SOLN
INTRAVENOUS | Status: DC | PRN
  Administered 2014-10-16: 50 mL

## 2014-10-16 MED ORDER — 0.9 % SODIUM CHLORIDE (POUR BTL) OPTIME
TOPICAL | Status: DC | PRN
  Administered 2014-10-16: 1000 mL

## 2014-10-16 MED ORDER — GLYCOPYRROLATE 0.2 MG/ML IJ SOLN
INTRAMUSCULAR | Status: AC
  Filled 2014-10-16: qty 3

## 2014-10-16 MED ORDER — ONDANSETRON HCL 4 MG PO TABS: 4.0000 mg | ORAL_TABLET | Freq: Four times a day (QID) | ORAL | Status: DC | PRN

## 2014-10-16 MED ORDER — ONDANSETRON HCL 4 MG/2ML IJ SOLN
INTRAMUSCULAR | Status: AC
  Filled 2014-10-16: qty 2

## 2014-10-16 MED ORDER — DEXAMETHASONE SODIUM PHOSPHATE 4 MG/ML IJ SOLN
INTRAMUSCULAR | Status: DC | PRN
  Administered 2014-10-16: 4 mg via INTRAVENOUS

## 2014-10-16 MED ORDER — EPHEDRINE SULFATE 50 MG/ML IJ SOLN
INTRAMUSCULAR | Status: DC | PRN
  Administered 2014-10-16: 10 mg via INTRAVENOUS
  Administered 2014-10-16: 15 mg via INTRAVENOUS

## 2014-10-16 MED ORDER — MORPHINE SULFATE 2 MG/ML IJ SOLN
2.0000 mg | INTRAMUSCULAR | Status: DC | PRN
  Administered 2014-10-16: 2 mg via INTRAVENOUS
  Filled 2014-10-16: qty 1

## 2014-10-16 MED ORDER — PROPOFOL 10 MG/ML IV BOLUS
INTRAVENOUS | Status: DC | PRN
  Administered 2014-10-16: 150 mg via INTRAVENOUS

## 2014-10-16 MED ORDER — LIDOCAINE HCL (CARDIAC) 20 MG/ML IV SOLN
INTRAVENOUS | Status: DC | PRN
  Administered 2014-10-16: 60 mg via INTRATRACHEAL
  Administered 2014-10-16: 60 mg via INTRAVENOUS

## 2014-10-16 MED ORDER — OXYCODONE HCL ER 10 MG PO T12A
20.0000 mg | EXTENDED_RELEASE_TABLET | Freq: Two times a day (BID) | ORAL | Status: DC
  Administered 2014-10-16 – 2014-10-17 (×3): 20 mg via ORAL
  Filled 2014-10-16 (×5): qty 2

## 2014-10-16 MED ORDER — NEOSTIGMINE METHYLSULFATE 10 MG/10ML IV SOLN
INTRAVENOUS | Status: DC | PRN
  Administered 2014-10-16: 4 mg via INTRAVENOUS

## 2014-10-16 MED ORDER — PHENYLEPHRINE HCL 10 MG/ML IJ SOLN
INTRAMUSCULAR | Status: DC | PRN
  Administered 2014-10-16: 80 ug via INTRAVENOUS
  Administered 2014-10-16: 40 ug via INTRAVENOUS
  Administered 2014-10-16: 80 ug via INTRAVENOUS

## 2014-10-16 MED ORDER — PANTOPRAZOLE SODIUM 40 MG PO TBEC
40.0000 mg | DELAYED_RELEASE_TABLET | Freq: Every day | ORAL | Status: DC
Start: 2014-10-16 — End: 2014-10-17
  Administered 2014-10-16: 40 mg via ORAL
  Filled 2014-10-16: qty 1

## 2014-10-16 MED ORDER — HYDROCODONE-ACETAMINOPHEN 10-325 MG PO TABS
1.0000 | ORAL_TABLET | ORAL | Status: DC | PRN
  Administered 2014-10-16 – 2014-10-17 (×2): 1 via ORAL
  Filled 2014-10-16 (×2): qty 1

## 2014-10-16 MED ORDER — PROMETHAZINE HCL 25 MG/ML IJ SOLN
INTRAMUSCULAR | Status: AC
  Filled 2014-10-16: qty 1

## 2014-10-16 MED ORDER — PROMETHAZINE HCL 25 MG/ML IJ SOLN
6.2500 mg | INTRAMUSCULAR | Status: DC | PRN
  Administered 2014-10-16: 6.25 mg via INTRAVENOUS

## 2014-10-16 MED ORDER — PROPOFOL 10 MG/ML IV BOLUS
INTRAVENOUS | Status: AC
  Filled 2014-10-16: qty 20

## 2014-10-16 MED ORDER — ARTIFICIAL TEARS OP OINT
TOPICAL_OINTMENT | OPHTHALMIC | Status: AC
  Filled 2014-10-16: qty 3.5

## 2014-10-16 MED ORDER — HYDROMORPHONE HCL 1 MG/ML IJ SOLN
0.2500 mg | INTRAMUSCULAR | Status: DC | PRN
  Administered 2014-10-16 (×4): 0.5 mg via INTRAVENOUS

## 2014-10-16 MED ORDER — GLYCOPYRROLATE 0.2 MG/ML IJ SOLN
INTRAMUSCULAR | Status: DC | PRN
  Administered 2014-10-16: 0.6 mg via INTRAVENOUS

## 2014-10-16 MED ORDER — METHOCARBAMOL 500 MG PO TABS
1000.0000 mg | ORAL_TABLET | Freq: Two times a day (BID) | ORAL | Status: DC
  Administered 2014-10-16 – 2014-10-17 (×3): 1000 mg via ORAL
  Filled 2014-10-16 (×3): qty 2

## 2014-10-16 MED ORDER — ROCURONIUM BROMIDE 50 MG/5ML IV SOLN
INTRAVENOUS | Status: AC
  Filled 2014-10-16: qty 1

## 2014-10-16 MED ORDER — MIDAZOLAM HCL 2 MG/2ML IJ SOLN
INTRAMUSCULAR | Status: AC
  Filled 2014-10-16: qty 2

## 2014-10-16 MED ORDER — ONDANSETRON HCL 4 MG/2ML IJ SOLN
INTRAMUSCULAR | Status: DC | PRN
  Administered 2014-10-16: 4 mg via INTRAVENOUS

## 2014-10-16 MED ORDER — BUPIVACAINE-EPINEPHRINE 0.25% -1:200000 IJ SOLN
INTRAMUSCULAR | Status: DC | PRN
  Administered 2014-10-16: 15 mL

## 2014-10-16 MED ORDER — BUPIVACAINE-EPINEPHRINE (PF) 0.25% -1:200000 IJ SOLN
INTRAMUSCULAR | Status: AC
Start: 2014-10-16 — End: 2014-10-16
  Filled 2014-10-16: qty 30

## 2014-10-16 MED ORDER — FENTANYL CITRATE (PF) 100 MCG/2ML IJ SOLN
INTRAMUSCULAR | Status: DC | PRN
Start: 2014-10-16 — End: 2014-10-16
  Administered 2014-10-16: 50 ug via INTRAVENOUS
  Administered 2014-10-16: 100 ug via INTRAVENOUS
  Administered 2014-10-16: 50 ug via INTRAVENOUS

## 2014-10-16 MED ORDER — HEPARIN SODIUM (PORCINE) 5000 UNIT/ML IJ SOLN
5000.0000 [IU] | Freq: Three times a day (TID) | INTRAMUSCULAR | Status: DC
  Administered 2014-10-17 (×2): 5000 [IU] via SUBCUTANEOUS
  Filled 2014-10-16 (×3): qty 1

## 2014-10-16 MED ORDER — KETOROLAC TROMETHAMINE 30 MG/ML IJ SOLN
INTRAMUSCULAR | Status: AC
  Filled 2014-10-16: qty 1

## 2014-10-16 MED ORDER — MIDAZOLAM HCL 5 MG/5ML IJ SOLN
INTRAMUSCULAR | Status: DC | PRN
  Administered 2014-10-16: 2 mg via INTRAVENOUS

## 2014-10-16 SURGICAL SUPPLY — 44 items
APPLIER CLIP 5 13 M/L LIGAMAX5 (MISCELLANEOUS) ×4
BLADE SURG ROTATE 9660 (MISCELLANEOUS) ×4 IMPLANT
CANISTER SUCTION 2500CC (MISCELLANEOUS) ×4 IMPLANT
CHLORAPREP W/TINT 26ML (MISCELLANEOUS) ×4 IMPLANT
CLIP APPLIE 5 13 M/L LIGAMAX5 (MISCELLANEOUS) ×2 IMPLANT
COVER MAYO STAND STRL (DRAPES) ×4 IMPLANT
COVER SURGICAL LIGHT HANDLE (MISCELLANEOUS) ×4 IMPLANT
DRAPE C-ARM 42X72 X-RAY (DRAPES) ×4 IMPLANT
DRAPE LAPAROSCOPIC ABDOMINAL (DRAPES) ×4 IMPLANT
ELECT REM PT RETURN 9FT ADLT (ELECTROSURGICAL) ×4
ELECTRODE REM PT RTRN 9FT ADLT (ELECTROSURGICAL) ×2 IMPLANT
FILTER SMOKE EVAC LAPAROSHD (FILTER) ×4 IMPLANT
GLOVE BIO SURGEON STRL SZ8 (GLOVE) ×4 IMPLANT
GLOVE BIOGEL PI IND STRL 6 (GLOVE) ×2 IMPLANT
GLOVE BIOGEL PI IND STRL 7.0 (GLOVE) ×2 IMPLANT
GLOVE BIOGEL PI IND STRL 8 (GLOVE) ×2 IMPLANT
GLOVE BIOGEL PI INDICATOR 6 (GLOVE) ×2
GLOVE BIOGEL PI INDICATOR 7.0 (GLOVE) ×2
GLOVE BIOGEL PI INDICATOR 8 (GLOVE) ×2
GLOVE SURG SS PI 7.0 STRL IVOR (GLOVE) ×8 IMPLANT
GOWN STRL REUS W/ TWL LRG LVL3 (GOWN DISPOSABLE) ×4 IMPLANT
GOWN STRL REUS W/ TWL XL LVL3 (GOWN DISPOSABLE) ×4 IMPLANT
GOWN STRL REUS W/TWL LRG LVL3 (GOWN DISPOSABLE) ×4
GOWN STRL REUS W/TWL XL LVL3 (GOWN DISPOSABLE) ×4
KIT BASIN OR (CUSTOM PROCEDURE TRAY) ×4 IMPLANT
KIT ROOM TURNOVER OR (KITS) ×4 IMPLANT
LIQUID BAND (GAUZE/BANDAGES/DRESSINGS) ×4 IMPLANT
NS IRRIG 1000ML POUR BTL (IV SOLUTION) ×4 IMPLANT
PAD ARMBOARD 7.5X6 YLW CONV (MISCELLANEOUS) ×4 IMPLANT
POUCH RETRIEVAL ECOSAC 10 (ENDOMECHANICALS) ×2 IMPLANT
POUCH RETRIEVAL ECOSAC 10MM (ENDOMECHANICALS) ×2
SCISSORS LAP 5X35 DISP (ENDOMECHANICALS) ×4 IMPLANT
SET CHOLANGIOGRAPH 5 50 .035 (SET/KITS/TRAYS/PACK) ×4 IMPLANT
SET IRRIG TUBING LAPAROSCOPIC (IRRIGATION / IRRIGATOR) ×4 IMPLANT
SLEEVE ENDOPATH XCEL 5M (ENDOMECHANICALS) ×8 IMPLANT
SPECIMEN JAR SMALL (MISCELLANEOUS) ×4 IMPLANT
SUT PROLENE 2 0 CT2 30 (SUTURE) ×4 IMPLANT
SUT VIC AB 4-0 PS2 27 (SUTURE) ×4 IMPLANT
TOWEL OR 17X24 6PK STRL BLUE (TOWEL DISPOSABLE) ×4 IMPLANT
TOWEL OR 17X26 10 PK STRL BLUE (TOWEL DISPOSABLE) ×4 IMPLANT
TRAY LAPAROSCOPIC (CUSTOM PROCEDURE TRAY) ×4 IMPLANT
TROCAR XCEL BLUNT TIP 100MML (ENDOMECHANICALS) ×4 IMPLANT
TROCAR XCEL NON-BLD 5MMX100MML (ENDOMECHANICALS) ×4 IMPLANT
TUBING INSUFFLATION (TUBING) ×4 IMPLANT

## 2014-10-16 NOTE — Op Note (Signed)
10/16/2014  8:33 AM  PATIENT:  Kent [name]  74 y.o. male  PRE-OPERATIVE DIAGNOSIS:  SYMPTOMATIC CHOLELITHIASIS  POST-OPERATIVE DIAGNOSIS:  CHRONIC CHOLECYSTITIS  PROCEDURE:  Procedure(s): LAPAROSCOPIC CHOLECYSTECTOMY  SURGEON:  Surgeon(s): Georganna Skeans, MD Excell Seltzer, MD  ASSISTANTS: Adonis Housekeeper, MD   ANESTHESIA:   local and general  EBL:     BLOOD ADMINISTERED:none  DRAINS: none   SPECIMEN:  Excision  DISPOSITION OF SPECIMEN:  PATHOLOGY  COUNTS:  YES  DICTATION: .Dragon Dictation Kent [name] presents for cholecystectomy. He was identified in the preop holding area. Informed consent was obtained. He received intravenous antibiotics. He was brought to the operating room and general endotracheal anesthesia was administered IV anesthesia staff. His abdomen was prepped and draped in a sterile fashion. We did a time out procedure. Due to his previous laparotomy and midline incisional hernia repair, we began with right upper quadrant Optiview. Optiview was done in standard fashion to the costal margin in the right upper quadrant. Trochar passed into the peritoneal cavity without difficulty. The abdomen was insufflated and the camera was reinserted. There were no complications from the insertion. Exploration revealed some right-sided abdominal adhesion is, and midline adhesions, but the area just above his umbilicus was free with visible incorporated mesh. Hassan trocar was placed in the supraumbilical position using open technique. We used a Prolene suture to make a pursestring. Under direct vision, 5 mm epigastric port was placed. Local was used at each of these port sites. Next, adhesions were taken down in the right lower quadrant from the bowel to the abdominal wall. These were very filmy and we stayed far away from the bowel wall. Next, a second 5 mm lateral port was placed in the right upper quadrant. The dome of the gallbladder was retracted superior medially. Some filmy  adhesions were swept down revealing the infundibulum. Other omental adhesions were taken down from the liver and the gallbladder using sharp dissection. Further dissection began laterally and progressed medially easily identifying the cystic duct. Dissection continued until we had excellent visualization. Once critical view was achieved between the cystic duct, the infundibulum, and the liver, a clip was placed on the infundibular cystic duct junction. A small nick was placed in the cystic duct and a cholangiocatheter was inserted. We could not get the cholangiogram catheter passed easily so, after multiple attempts, it was abandoned. Anatomy was excellent and labs have been okay. 3 clips were placed proximally on the cystic duct and it was divided. Dissection revealed an anterior branch of the cystic artery which was clipped twice proximally once distally and divided. Further dissection revealed a larger posterior branch of cystic artery which was clipped twice proximally once distally and divided. Gallbladder was taken off the liver bed using cautery. Excellent hemostasis was obtained. Gallbladder was placed in an Ecosac and removed from the abdomen via the supraumbilical port site. Liver bed was copiously irrigated and meticulous hemostasis was obtained. Irrigation returned clear. Cuts remain in excellent position. Liver bed was dry. Ports were removed under direct vision. Pneumoperitoneum was released. Supraumbilical fascia was closed by tying the Prolene purse string suture. All 4 wounds were copiously irrigated and the skin of each was closed with running 4 Vicryl subcuticular followed by Dermabond. All counts were correct. Patient tolerated procedure well without apparent complication and was taken recovery in stable condition. PATIENT DISPOSITION:  PACU - hemodynamically stable.   Delay start of Pharmacological VTE agent (>24hrs) due to surgical blood loss or risk of bleeding:  no  Georganna Skeans, MD,  MPH, FACS Pager: [phone]  4/25/20168:33 AM

## 2014-10-16 NOTE — Progress Notes (Signed)
Patient present on floor from PACU. Report received via phone from Lawanda Cousins, RN. Patient stable. Family at bedside.

## 2014-10-16 NOTE — Interval H&P Note (Signed)
History and Physical Interval Note:  10/16/2014 6:50 AM  Kent [name]  has presented today for surgery, with the diagnosis of gallstones  The various methods of treatment have been discussed with the patient and family. After consideration of risks, benefits and other options for treatment, the patient has consented to  Procedure(s): LAPAROSCOPIC CHOLECYSTECTOMY WITH INTRAOPERATIVE CHOLANGIOGRAM (N/A) as a surgical intervention .  The patient's history has been reviewed, patient re-examined, no change in status, stable for surgery.  I have reviewed the patient's chart and labs.  Questions were answered to the patient's satisfaction.     Unnamed Hino E

## 2014-10-16 NOTE — Anesthesia Preprocedure Evaluation (Signed)
Anesthesia Evaluation  Patient identified by MRN, date of birth, ID band Patient awake    Reviewed: Allergy & Precautions, NPO status , Patient's Chart, lab work & pertinent test results  Airway Mallampati: II  TM Distance: >3 FB Neck ROM: Full    Dental no notable dental hx.    Pulmonary neg pulmonary ROS, former smoker,  breath sounds clear to auscultation  Pulmonary exam normal       Cardiovascular negative cardio ROS  Rhythm:Regular Rate:Normal     Neuro/Psych negative neurological ROS  negative psych ROS   GI/Hepatic Neg liver ROS, GERD-  Medicated,  Endo/Other  negative endocrine ROS  Renal/GU negative Renal ROS  negative genitourinary   Musculoskeletal negative musculoskeletal ROS (+)   Abdominal   Peds negative pediatric ROS (+)  Hematology negative hematology ROS (+)   Anesthesia Other Findings   Reproductive/Obstetrics negative OB ROS                             Anesthesia Physical Anesthesia Plan  ASA: II  Anesthesia Plan: General   Post-op Pain Management:    Induction: Intravenous  Airway Management Planned: Oral ETT  Additional Equipment:   Intra-op Plan:   Post-operative Plan: Extubation in OR  Informed Consent: I have reviewed the patients History and Physical, chart, labs and discussed the procedure including the risks, benefits and alternatives for the proposed anesthesia with the patient or authorized representative who has indicated his/her understanding and acceptance.   Dental advisory given  Plan Discussed with: CRNA and Surgeon  Anesthesia Plan Comments:         Anesthesia Quick Evaluation

## 2014-10-16 NOTE — H&P (Signed)
  History of Present Illness Kent [name]; 09/27/2014 10:19 AM) Patient words: Gallstones.  The patient is a 74 year old male who presents for evaluation of gall stones. Kent [name] is well known to me status post repair of incisional hernia with mesh in October 2015. He has been having postprandial right upper quadrant pain which extends around to his back. This is associated with bloating which does cause him some periumbilical discomfort as well. He underwent CT scan of the abdomen and pelvis ordered by Dr. Watt [name] and this demonstrated gallstones. He has lost a few pounds due to eating less because of the symptoms as above.   Allergies Kent Crate, LPN; 02/27/2951 84:13 AM) No Known Drug Allergies09/14/2015  Medication History Kent Crate, LPN; 07/27/4008 27:25 AM) Kent [name] OTC (20MG  Tablet DR, Oral daily) Active. OxyCONTIN (20MG  Tablet ER 12HR, Oral two times daily) Active. Methocarbamol (500MG  Tablet, Oral two times daily) Active. Vicodin (5-300MG  Tablet, Oral three times daily) Active. Gabapentin (300MG  Capsule, Oral three times daily) Active.  Vitals Kent [name] [facility] LPN; 08/27/6438 34:74 AM) 09/27/2014 10:04 AM Weight: 165.38 lb Height: 71in Body Surface Area: 1.94 m Body Mass Index: 23.06 kg/m Temp.: 98.34F(Oral)  Pulse: 79 (Regular)  BP: 128/64 (Sitting, Left Arm, Standard)    Physical Exam Kent [name]; 09/27/2014 10:20 AM) General Note: No distress   Head and Neck Note: Normocephalic, neck is supple   Eye Note: Pupils are equal, extraocular muscles are intact   ENMT Note: Oral mucosa is moist   Chest and Lung Exam Note: Clear to auscultation bilaterally   Cardiovascular Note: Regular rate and rhythm   Abdomen Note: Soft, midline scar, no recurrent hernia, mild tenderness to deep palpation right upper quadrant   Musculoskeletal Note: Nontender and no deformity     Assessment & Plan Kent [name];  09/27/2014 10:21 AM) CHOLELITHIASIS (574.20  K80.20) Impression: I believe his symptoms are due to gallstones. I have offered laparoscopic cholecystectomy, possible open cholecystectomy, with cholangiogram. Procedure, risks, and benefits were discussed in detail with him. He understands there is an increased risk of conversion to open procedure due to his prior operation . We will plan to do this with an overnight stay at [facility]. He is agreeable.   Kent Skeans, [name], MPH, FACS Trauma: [phone] General Surgery: [phone]

## 2014-10-16 NOTE — Transfer of Care (Signed)
Immediate Anesthesia Transfer of Care Note  Patient: Kent [name]  Procedure(s) Performed: Procedure(s): LAPAROSCOPIC CHOLECYSTECTOMY WITH ATTEMPTED CHOLANGIOGRAM (N/A)  Patient Location: PACU  Anesthesia Type:General  Level of Consciousness: awake, alert  and oriented  Airway & Oxygen Therapy: Patient Spontanous Breathing and Patient connected to nasal cannula oxygen  Post-op Assessment: Report given to RN and Post -op Vital signs reviewed and stable  Post vital signs: Reviewed and stable  Last Vitals:  Filed Vitals:   10/16/14 0845  BP:   Pulse:   Temp: 36.3 C  Resp:     Complications: No apparent anesthesia complications

## 2014-10-16 NOTE — Addendum Note (Signed)
Addendum  created 10/16/14 9629 by Maryland Pink, CRNA   Modules edited: Anesthesia LDA, Anesthesia Medication Administration, Lines/Drains/Airways Properties Editor   Lines/Drains/Airways Properties Editor:  Properties of line/drain/airway/wound Peripheral IV Left Forearm have been modified.

## 2014-10-16 NOTE — Anesthesia Postprocedure Evaluation (Signed)
  Anesthesia Post-op Note  Patient: Kent [name]  Procedure(s) Performed: Procedure(s) (LRB): LAPAROSCOPIC CHOLECYSTECTOMY WITH ATTEMPTED CHOLANGIOGRAM (N/A)  Patient Location: PACU  Anesthesia Type: General  Level of Consciousness: awake and alert   Airway and Oxygen Therapy: Patient Spontanous Breathing  Post-op Pain: mild  Post-op Assessment: Post-op Vital signs reviewed, Patient's Cardiovascular Status Stable, Respiratory Function Stable, Patent Airway and No signs of Nausea or vomiting  Last Vitals:  Filed Vitals:   10/16/14 0915  BP: 162/68  Pulse: 67  Temp:   Resp: 14    Post-op Vital Signs: stable   Complications: No apparent anesthesia complications

## 2014-10-16 NOTE — Anesthesia Procedure Notes (Signed)
Procedure Name: Intubation Date/Time: 10/16/2014 7:30 AM Performed by: Maryland Pink Pre-anesthesia Checklist: Patient identified, Emergency Drugs available, Suction available, Patient being monitored and Timeout performed Patient Re-evaluated:Patient Re-evaluated prior to inductionOxygen Delivery Method: Circle system utilized Preoxygenation: Pre-oxygenation with 100% oxygen Intubation Type: IV induction Ventilation: Mask ventilation without difficulty Laryngoscope Size: Mac and 4 Grade View: Grade I Tube type: Oral Tube size: 7.5 mm Number of attempts: 1 Airway Equipment and Method: Stylet and LTA kit utilized Placement Confirmation: ETT inserted through vocal cords under direct vision,  positive ETCO2 and breath sounds checked- equal and bilateral Secured at: 23 cm Tube secured with: Tape Dental Injury: Teeth and Oropharynx as per pre-operative assessment

## 2014-10-17 ENCOUNTER — Encounter (HOSPITAL_COMMUNITY): Payer: Self-pay | Admitting: General Surgery

## 2014-10-17 DIAGNOSIS — K8010 Calculus of gallbladder with chronic cholecystitis without obstruction: Secondary | ICD-10-CM | POA: Diagnosis not present

## 2014-10-17 MED ORDER — HYDROCODONE-ACETAMINOPHEN 10-325 MG PO TABS: 1.0000 | ORAL_TABLET | ORAL | Status: DC | PRN

## 2014-10-17 NOTE — Evaluation (Signed)
Physical Therapy Evaluation Patient Details Name: JIDENNA FIGGS MRN: [id number] DOB: [birth year] Today's Date: 10/17/2014   History of Present Illness  pt rpesents with dizziness post Lap Chole.    Clinical Impression  Pt moving well with only complaint of feeling dizzy or like the room is constantly moving.  Pt indicates he had this same sensation after his surgery in October 2015.  Pt demos good safety awareness and only requires S with ambulation.  Discussed pacing activities at home and that if symptoms don't resolve as they did after his pervious surgery, that pt should f/u with MD.  Feel no further PT needs at this time, will sign off.      Follow Up Recommendations No PT follow up;Supervision for mobility/OOB    Equipment Recommendations  None recommended by PT    Recommendations for Other Services       Precautions / Restrictions Precautions Precautions: None Restrictions Weight Bearing Restrictions: No      Mobility  Bed Mobility Overal bed mobility: Independent                Transfers Overall transfer level: Modified independent Equipment used: None                Ambulation/Gait Ambulation/Gait assistance: Supervision Ambulation Distance (Feet): 300 Feet Assistive device:  (Used IV pole) Gait Pattern/deviations: Step-through pattern;Decreased stride length     General Gait Details: pt moved well, but held IV pole for security.  Discussed with pt using cane at home for mobility.  pt indicates dizziness during mobility, but not changed from when he was sitting.    Stairs            Wheelchair Mobility    Modified Rankin (Stroke Patients Only)       Balance Overall balance assessment: Needs assistance Sitting-balance support: No upper extremity supported;Feet supported Sitting balance-Leahy Scale: Good     Standing balance support: No upper extremity supported;During functional activity Standing balance-Leahy Scale: Fair Standing  balance comment: Uses UE support during dynamic mobility only.                               Pertinent Vitals/Pain Pain Assessment: No/denies pain    Home Living Family/patient expects to be discharged to:: Private residence Living Arrangements: Spouse/significant other;Children Available Help at Discharge: Family;Available 24 hours/day Type of Home: House Home Access: Stairs to enter Entrance Stairs-Rails: None Entrance Stairs-Number of Steps: 3 Home Layout: Two level;Bed/bath upstairs Home Equipment: Walker - 2 wheels;Cane - single point;Bedside commode;Shower seat      Prior Function Level of Independence: Independent with assistive device(s)         Comments: pt indicates occasionally uses cane when arthritis is "acting up".       Hand Dominance        Extremity/Trunk Assessment   Upper Extremity Assessment: Overall WFL for tasks assessed           Lower Extremity Assessment: Overall WFL for tasks assessed      Cervical / Trunk Assessment: Normal  Communication   Communication: No difficulties  Cognition Arousal/Alertness: Awake/alert Behavior During Therapy: WFL for tasks assessed/performed Overall Cognitive Status: Within Functional Limits for tasks assessed                      General Comments      Exercises        Assessment/Plan  PT Assessment Patent does not need any further PT services  PT Diagnosis Difficulty walking   PT Problem List    PT Treatment Interventions     PT Goals (Current goals can be found in the Care Plan section) Acute Rehab PT Goals PT Goal Formulation: With patient    Frequency     Barriers to discharge        Co-evaluation               End of Session   Activity Tolerance: Patient tolerated treatment well Patient left: in bed;with call bell/phone within reach;with family/visitor present Nurse Communication: Mobility status         Time: 9811-9147 PT Time Calculation  (min) (ACUTE ONLY): 26 min   Charges:   PT Evaluation $Initial PT Evaluation Tier I: 1 Procedure PT Treatments $Gait Training: 8-22 mins   PT G CodesCatarina Hartshorn, [place] 10/17/2014, 10:09 AM

## 2014-10-17 NOTE — Progress Notes (Signed)
UR completed 

## 2014-10-17 NOTE — Progress Notes (Signed)
1 Day Post-Op  Subjective: Tolerated clears, a lot of pain initially but better now. Felt quite unsteady on his feet.  Objective: Vital signs in last 24 hours: Temp:  [97.3 F (36.3 C)-98.4 F (36.9 C)] 97.7 F (36.5 C) (04/26 0450) Pulse Rate:  [67-92] 75 (04/26 0450) Resp:  [10-22] 17 (04/26 0450) BP: (115-172)/(53-91) 115/68 mmHg (04/26 0450) SpO2:  [100 %] 100 % (04/26 0450) Last BM Date: 10/15/14  Intake/Output from previous day: 04/25 0701 - 04/26 0700 In: 1000 [I.V.:1000] Out: 420 [Urine:400; Blood:20] Intake/Output this shift:    General appearance: alert and cooperative Resp: clear to auscultation bilaterally Cardio: regular rate and rhythm GI: soft, incisions OK  Lab Results:   Recent Labs  10/16/14 1328  WBC 7.1  HGB 12.5*  HCT 37.6*  PLT 167   BMET  Recent Labs  10/16/14 1328  CREATININE 1.25   PT/INR No results for input(s): LABPROT, INR in the last 72 hours. ABG No results for input(s): PHART, HCO3 in the last 72 hours.  Invalid input(s): PCO2, PO2  Studies/Results: No results found.  Anti-infectives: Anti-infectives    Start     Dose/Rate Route Frequency Ordered Stop   10/16/14 0600  ceFAZolin (ANCEF) IVPB 2 g/50 mL premix     2 g 100 mL/hr over 30 Minutes Intravenous On call to O.R. 10/15/14 1443 10/16/14 0733      Assessment/Plan: s/p Procedure(s): LAPAROSCOPIC CHOLECYSTECTOMY WITH ATTEMPTED CHOLANGIOGRAM (N/A) POD#1 SL IV PT eval D/C later today if does well with PT   Jodell Weitman E 10/17/2014

## 2014-10-17 NOTE — Discharge Summary (Signed)
Physician Discharge Summary  Patient ID: Kent [name] MRN: [id number] DOB/AGE: February 15, 1941 74 y.o.  Admit date: 10/16/2014 Discharge date: 10/17/2014  Admission Diagnoses:symptomatic cholelithiasis  Discharge Diagnoses: chronic cholecystitis Active Problems:   S/P laparoscopic cholecystectomy   Discharged Condition: good  Hospital Course: Kent [name] underwent laparoscopic cholecystectomy. He was observed overnight and did well. He was evaluated by PT on POD#1 and is ready for D/C.  Consults: None  Significant Diagnostic Studies: none  Treatments: surgery: above  Discharge Exam: Blood pressure 115/68, pulse 75, temperature 97.7 F (36.5 C), temperature source Oral, resp. rate 17, SpO2 100 %. See note  Disposition: 01-Home or Self Care  Discharge Instructions    Diet - low sodium heart healthy    Complete by:  As directed      Discharge instructions    Complete by:  As directed   [facility] ______[facility] LAPAROSCOPIC SURGERY: POST OP INSTRUCTIONS Always review your discharge instruction sheet given to you by the facility where your surgery was performed. IF YOU HAVE DISABILITY OR FAMILY LEAVE FORMS, YOU MUST BRING THEM TO THE OFFICE FOR PROCESSING.   DO NOT GIVE THEM TO YOUR DOCTOR.  A prescription for pain medication may be given to you upon discharge.  Take your pain medication as prescribed, if needed.  If narcotic pain medicine is not needed, then you may take acetaminophen (Tylenol) or ibuprofen (Advil) as needed. Take your usually prescribed medications unless otherwise directed. If you need a refill on your pain medication, please contact your pharmacy.  They will contact our office to request authorization. Prescriptions will not be filled after 5pm or on week-ends. You should follow a light diet the first few days after arrival home, such as soup and crackers, etc.  Be sure to include lots of fluids daily. Most patients will experience some swelling and  bruising in the area of the incisions.  Ice packs will help.  Swelling and bruising can take several days to resolve.  It is common to experience some constipation if taking pain medication after surgery.  Increasing fluid intake and taking a stool softener (such as Colace) will usually help or prevent this problem from occurring.  A mild laxative (Milk of Magnesia or Miralax) should be taken according to package instructions if there are no bowel movements after 48 hours. Unless discharge instructions indicate otherwise, you may remove your bandages 24-48 hours after surgery, and you may shower at that time.  You may have steri-strips (small skin tapes) in place directly over the incision.  These strips should be left on the skin for 7-10 days.  If your surgeon used skin glue on the incision, you may shower in 24 hours.  The glue will flake off over the next 2-3 weeks.  Any sutures or staples will be removed at the office during your follow-up visit. ACTIVITIES:  You may resume regular (light) daily activities beginning the next day-such as daily self-care, walking, climbing stairs-gradually increasing activities as tolerated.  You may have sexual intercourse when it is comfortable.  Refrain from any heavy lifting or straining until approved by your doctor. You may drive when you are no longer taking prescription pain medication, you can comfortably wear a seatbelt, and you can safely maneuver your car and apply brakes. RETURN TO WORK:  __________________________________________________________ Kent [name] should see your doctor in the office for a follow-up appointment approximately 2-3 weeks after your surgery.  Make sure that you call for this appointment within a day or two  after you arrive home to insure a convenient appointment time. OTHER INSTRUCTIONS: __________________________________________________________________________________________________________________________  __________________________________________________________________________________________________________________________ WHEN TO CALL YOUR DOCTOR: Fever over 101.0 Inability to urinate Continued bleeding from incision. Increased pain, redness, or drainage from the incision. Increasing abdominal pain  The clinic staff is available to answer your questions during regular business hours.  Please don't hesitate to call and ask to speak to one of the nurses for clinical concerns.  If you have a medical emergency, go to the nearest emergency room or call 911.  A surgeon from [facility] is always on call at the hospital. [address], [place], [place], [address]  [address] ? [address], [place], [place]   [address] [phone] ? [phone] ? FAX [phone] Web site: [email or web address]     Increase activity slowly    Complete by:  As directed      No dressing needed    Complete by:  As directed             Medication List    TAKE these medications        gabapentin 300 MG capsule  Commonly known as:  NEURONTIN  Take 600 mg by mouth every 8 (eight) hours.     GAS-X PO  Take 2 tablets by mouth every 6 (six) hours as needed (gas).     HYDROcodone-acetaminophen 5-325 MG per tablet  Commonly known as:  NORCO/VICODIN  Take 1 tablet by mouth every 4 (four) hours as needed (pain).     HYDROcodone-acetaminophen 10-325 MG per tablet  Commonly known as:  NORCO  Take 1 tablet by mouth every 4 (four) hours as needed.     LIPOFLAVONOID PO  Take by mouth 3 (three) times daily.     methocarbamol 500 MG tablet  Commonly known as:  ROBAXIN  Take 1,000 mg by mouth 2 (two) times daily.     omeprazole 40 MG capsule  Commonly known as:  PRILOSEC  Take 40 mg by mouth daily.     oxyCODONE 20 MG 12 hr tablet  Commonly known as:  OXYCONTIN  Take 20 mg by mouth every 12 (twelve) hours.     pantoprazole 40 MG tablet  Commonly known as:  PROTONIX  Take  40 mg by mouth at bedtime.     psyllium 95 % Pack  Commonly known as:  HYDROCIL/METAMUCIL  Take 1 packet by mouth daily.     tamsulosin 0.4 MG Caps capsule  Commonly known as:  FLOMAX  Take 1 capsule (0.4 mg total) by mouth daily.           Follow-up Information    Follow up with Zenovia Jarred, MD On 11/01/2014.   Specialty:  General Surgery   Why:  as scheduled   Contact information:   [address] [place] [address] [phone]       Signed: Zenovia Jarred 10/17/2014, 2:13 PM

## 2014-10-18 NOTE — Progress Notes (Signed)
   11-07-14 1005  PT Visit Information  Last PT Received On 07-Nov-2014  PT G-Codes **NOT FOR INPATIENT CLASS**  Functional Assessment Tool Used Clinical Judgement  Functional Limitation Mobility: Walking and moving around  Mobility: Walking and Moving Around Current Status [id number]) CI  Mobility: Walking and Moving Around Goal Status [phone]) CI  Mobility: Walking and Moving Around Discharge Status [id number]) CI   Late entry for missed G-code.    [place], [place]

## 2014-12-29 ENCOUNTER — Other Ambulatory Visit: Payer: Self-pay

## 2014-12-29 DIAGNOSIS — R109 Unspecified abdominal pain: Secondary | ICD-10-CM

## 2014-12-29 DIAGNOSIS — Z9049 Acquired absence of other specified parts of digestive tract: Secondary | ICD-10-CM

## 2014-12-29 DIAGNOSIS — R1084 Generalized abdominal pain: Secondary | ICD-10-CM

## 2015-01-01 ENCOUNTER — Ambulatory Visit
Admission: RE | Admit: 2015-01-01 | Discharge: 2015-01-01 | Disposition: A | Discharge status: Home / Self Care | Payer: Medicare Other | Source: Ambulatory Visit | Attending: Surgery | Admitting: Surgery

## 2015-01-01 MED ORDER — IOPAMIDOL (ISOVUE-300) INJECTION 61%
100.0000 mL | Freq: Once | INTRAVENOUS | Status: AC | PRN
  Administered 2015-01-01: 100 mL via INTRAVENOUS

## 2015-03-14 ENCOUNTER — Other Ambulatory Visit: Payer: Self-pay | Admitting: General Surgery

## 2015-03-14 DIAGNOSIS — R109 Unspecified abdominal pain: Secondary | ICD-10-CM

## 2015-03-16 ENCOUNTER — Ambulatory Visit
Admission: RE | Admit: 2015-03-16 | Discharge: 2015-03-16 | Disposition: A | Discharge status: Home / Self Care | Payer: Medicare Other | Source: Ambulatory Visit | Attending: General Surgery | Admitting: General Surgery

## 2015-03-16 DIAGNOSIS — R109 Unspecified abdominal pain: Secondary | ICD-10-CM

## 2015-03-16 MED ORDER — IOPAMIDOL (ISOVUE-370) INJECTION 76%
100.0000 mL | Freq: Once | INTRAVENOUS | Status: AC | PRN
  Administered 2015-03-16: 100 mL via INTRAVENOUS

## 2015-03-19 ENCOUNTER — Other Ambulatory Visit: Payer: Medicare Other

## 2015-05-08 ENCOUNTER — Other Ambulatory Visit: Payer: Self-pay | Admitting: Orthopaedic Surgery

## 2015-05-08 DIAGNOSIS — M47816 Spondylosis without myelopathy or radiculopathy, lumbar region: Secondary | ICD-10-CM

## 2015-05-09 ENCOUNTER — Ambulatory Visit
Admission: RE | Admit: 2015-05-09 | Discharge: 2015-05-09 | Disposition: A | Discharge status: Home / Self Care | Payer: Medicare Other | Source: Ambulatory Visit | Attending: Orthopaedic Surgery | Admitting: Orthopaedic Surgery

## 2015-05-09 DIAGNOSIS — M47816 Spondylosis without myelopathy or radiculopathy, lumbar region: Secondary | ICD-10-CM

## 2015-05-09 MED ORDER — GADOBENATE DIMEGLUMINE 529 MG/ML IV SOLN: 15.0000 mL | Freq: Once | INTRAVENOUS | Status: DC | PRN

## 2015-06-08 ENCOUNTER — Other Ambulatory Visit: Payer: Self-pay | Admitting: Orthopaedic Surgery

## 2015-06-08 DIAGNOSIS — M961 Postlaminectomy syndrome, not elsewhere classified: Secondary | ICD-10-CM

## 2015-06-20 ENCOUNTER — Ambulatory Visit
Admission: RE | Admit: 2015-06-20 | Discharge: 2015-06-20 | Disposition: A | Discharge status: Home / Self Care | Payer: Medicare Other | Source: Ambulatory Visit | Attending: Orthopaedic Surgery | Admitting: Orthopaedic Surgery

## 2015-06-20 DIAGNOSIS — M961 Postlaminectomy syndrome, not elsewhere classified: Secondary | ICD-10-CM

## 2015-06-21 ENCOUNTER — Other Ambulatory Visit: Payer: Self-pay | Admitting: Orthopaedic Surgery

## 2015-06-21 DIAGNOSIS — M961 Postlaminectomy syndrome, not elsewhere classified: Secondary | ICD-10-CM

## 2015-06-22 ENCOUNTER — Other Ambulatory Visit: Payer: Medicare Other

## 2015-06-26 ENCOUNTER — Ambulatory Visit
Admission: RE | Admit: 2015-06-26 | Discharge: 2015-06-26 | Disposition: A | Discharge status: Home / Self Care | Payer: Medicare Other | Source: Ambulatory Visit | Attending: Orthopaedic Surgery | Admitting: Orthopaedic Surgery

## 2015-06-26 ENCOUNTER — Other Ambulatory Visit: Payer: Medicare Other

## 2015-06-26 ENCOUNTER — Other Ambulatory Visit: Payer: Self-pay | Admitting: Orthopaedic Surgery

## 2015-06-26 DIAGNOSIS — M961 Postlaminectomy syndrome, not elsewhere classified: Secondary | ICD-10-CM

## 2015-09-03 ENCOUNTER — Other Ambulatory Visit: Payer: Self-pay | Admitting: Gastroenterology

## 2016-02-13 ENCOUNTER — Encounter (HOSPITAL_COMMUNITY): Payer: Self-pay | Admitting: Neurology

## 2016-02-13 ENCOUNTER — Emergency Department (HOSPITAL_COMMUNITY): Payer: Medicare Other

## 2016-02-13 ENCOUNTER — Inpatient Hospital Stay (HOSPITAL_COMMUNITY)
Admission: EM | Admit: 2016-02-13 | Discharge: 2016-02-15 | DRG: 390 | Disposition: A | Discharge status: Home / Self Care | Payer: Medicare Other | Attending: Internal Medicine | Admitting: Internal Medicine

## 2016-02-13 DIAGNOSIS — Z79899 Other long term (current) drug therapy: Secondary | ICD-10-CM

## 2016-02-13 DIAGNOSIS — R112 Nausea with vomiting, unspecified: Secondary | ICD-10-CM

## 2016-02-13 DIAGNOSIS — Z87891 Personal history of nicotine dependence: Secondary | ICD-10-CM

## 2016-02-13 DIAGNOSIS — Z9049 Acquired absence of other specified parts of digestive tract: Secondary | ICD-10-CM

## 2016-02-13 DIAGNOSIS — R109 Unspecified abdominal pain: Secondary | ICD-10-CM | POA: Diagnosis present

## 2016-02-13 DIAGNOSIS — K219 Gastro-esophageal reflux disease without esophagitis: Secondary | ICD-10-CM | POA: Diagnosis present

## 2016-02-13 DIAGNOSIS — K5660 Unspecified intestinal obstruction: Principal | ICD-10-CM | POA: Diagnosis present

## 2016-02-13 DIAGNOSIS — M549 Dorsalgia, unspecified: Secondary | ICD-10-CM | POA: Diagnosis present

## 2016-02-13 DIAGNOSIS — G8929 Other chronic pain: Secondary | ICD-10-CM | POA: Diagnosis present

## 2016-02-13 DIAGNOSIS — Z85828 Personal history of other malignant neoplasm of skin: Secondary | ICD-10-CM

## 2016-02-13 DIAGNOSIS — R1110 Vomiting, unspecified: Secondary | ICD-10-CM | POA: Insufficient documentation

## 2016-02-13 LAB — COMPREHENSIVE METABOLIC PANEL
ALK PHOS: 88 U/L (ref 38–126)
ALT: 107 U/L — ABNORMAL HIGH (ref 17–63)
ANION GAP: 7 (ref 5–15)
AST: 107 U/L — ABNORMAL HIGH (ref 15–41)
Albumin: 4.2 g/dL (ref 3.5–5.0)
BILIRUBIN TOTAL: 0.9 mg/dL (ref 0.3–1.2)
BUN: 11 mg/dL (ref 6–20)
CALCIUM: 9.8 mg/dL (ref 8.9–10.3)
CO2: 29 mmol/L (ref 22–32)
CREATININE: 1.03 mg/dL (ref 0.61–1.24)
Chloride: 101 mmol/L (ref 101–111)
Glucose, Bld: 107 mg/dL — ABNORMAL HIGH (ref 65–99)
Potassium: 4.6 mmol/L (ref 3.5–5.1)
Sodium: 137 mmol/L (ref 135–145)
Total Protein: 7.2 g/dL (ref 6.5–8.1)

## 2016-02-13 LAB — URINALYSIS, ROUTINE W REFLEX MICROSCOPIC
BILIRUBIN URINE: NEGATIVE
GLUCOSE, UA: NEGATIVE mg/dL
HGB URINE DIPSTICK: NEGATIVE
KETONES UR: NEGATIVE mg/dL
Leukocytes, UA: NEGATIVE
Nitrite: NEGATIVE
PROTEIN: NEGATIVE mg/dL
Specific Gravity, Urine: 1.016 (ref 1.005–1.030)
pH: 6 (ref 5.0–8.0)

## 2016-02-13 LAB — LIPASE, BLOOD: Lipase: 23 U/L (ref 11–51)

## 2016-02-13 LAB — CBC
HCT: 39.2 % (ref 39.0–52.0)
Hemoglobin: 13 g/dL (ref 13.0–17.0)
MCH: 29.1 pg (ref 26.0–34.0)
MCHC: 33.2 g/dL (ref 30.0–36.0)
MCV: 87.7 fL (ref 78.0–100.0)
PLATELETS: 191 10*3/uL (ref 150–400)
RBC: 4.47 MIL/uL (ref 4.22–5.81)
RDW: 13.1 % (ref 11.5–15.5)
WBC: 4.4 10*3/uL (ref 4.0–10.5)

## 2016-02-13 MED ORDER — MORPHINE SULFATE (PF) 4 MG/ML IV SOLN: 4.0000 mg | INTRAVENOUS | Status: DC | PRN

## 2016-02-13 MED ORDER — MORPHINE SULFATE (PF) 4 MG/ML IV SOLN
4.0000 mg | Freq: Once | INTRAVENOUS | Status: AC
  Administered 2016-02-13: 4 mg via INTRAVENOUS
  Filled 2016-02-13: qty 1

## 2016-02-13 MED ORDER — IOPAMIDOL (ISOVUE-300) INJECTION 61%
INTRAVENOUS | Status: AC
  Administered 2016-02-13: 100 mL
  Filled 2016-02-13: qty 100

## 2016-02-13 MED ORDER — FAMOTIDINE IN NACL 20-0.9 MG/50ML-% IV SOLN
20.0000 mg | Freq: Two times a day (BID) | INTRAVENOUS | Status: DC
  Administered 2016-02-13 – 2016-02-15 (×4): 20 mg via INTRAVENOUS
  Filled 2016-02-13 (×5): qty 50

## 2016-02-13 MED ORDER — ONDANSETRON HCL 4 MG/2ML IJ SOLN
4.0000 mg | Freq: Once | INTRAMUSCULAR | Status: AC
  Administered 2016-02-13: 4 mg via INTRAVENOUS
  Filled 2016-02-13: qty 2

## 2016-02-13 MED ORDER — SODIUM CHLORIDE 0.9 % IV BOLUS (SEPSIS)
1000.0000 mL | Freq: Once | INTRAVENOUS | Status: AC
  Administered 2016-02-13: 1000 mL via INTRAVENOUS

## 2016-02-13 MED ORDER — SODIUM CHLORIDE 0.9 % IV SOLN: INTRAVENOUS | Status: DC

## 2016-02-13 MED ORDER — HYDROMORPHONE HCL 1 MG/ML IJ SOLN
2.0000 mg | INTRAMUSCULAR | Status: DC | PRN
  Administered 2016-02-14 (×5): 2 mg via INTRAVENOUS
  Filled 2016-02-13 (×5): qty 2

## 2016-02-13 MED ORDER — ONDANSETRON HCL 4 MG/2ML IJ SOLN
4.0000 mg | Freq: Four times a day (QID) | INTRAMUSCULAR | Status: DC | PRN
  Administered 2016-02-14 (×3): 4 mg via INTRAVENOUS
  Filled 2016-02-13 (×3): qty 2

## 2016-02-13 MED ORDER — SODIUM CHLORIDE 0.9 % IV SOLN
INTRAVENOUS | Status: DC
  Administered 2016-02-13 – 2016-02-14 (×3): via INTRAVENOUS

## 2016-02-13 NOTE — H&P (Signed)
History and Physical    Kent [name] [id number] DOB: [birth year] DOA: 02/13/2016  PCP: Shirline Frees, MD   Patient coming from: Home  Chief Complaint: Abdominal pain.  HPI: Kent [name] is a 75 y.o. 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  Per patient, around 0100, he developed below the navel and LLQ tenderness. He describes the pain as colicky associated with nausea, 4-5 episodes of diarrhea on Monday morning. The pain subsequently subsided and he was able to eat a frozen dinner on Monday evening. However, early Tuesday morning he developed similar symptoms with 3-4 episodes of diarrhea. He states that his loose stools have improved, but his abdominal pain pain has persisted through Wednesday, so he called his surgeon, Dr. Grandville Silos, who recommended to the patient to hold to the emergency department. He denies fever, chills, emesis, melena, hematochezia or GU symptoms.  ED Course: The patient received IV fluids, analgesics and antiemetics reporting relief. Workup showed both transaminase 107 U/L and CT scan abdomen/pelvis did not have any acute findings.  Review of Systems: As per HPI otherwise 10 point review of systems negative.    Past Medical History:  Diagnosis Date  . 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 Cancer ([place])    basal cell cancer - skin  . Chronic back pain    s/p back surgery  . GERD (gastroesophageal reflux disease)   . GERD (gastroesophageal reflux disease) 08/18/2013  . Incisional hernia   . Sinus infection    currently on antibiotics  . Small bowel obstruction due to adhesions ([place])   . Small bowel obstruction due to adhesions [facility])     Past Surgical History:  Procedure  Laterality Date  . BACK SURGERY  08/25/2001   Neck 3 disk replacements  . BACK SURGERY     Placement/removal of nerve stimulator  . CHOLECYSTECTOMY N/A 10/16/2014   Procedure: LAPAROSCOPIC CHOLECYSTECTOMY WITH ATTEMPTED CHOLANGIOGRAM;  Surgeon: Georganna Skeans, MD;  Location: [place];  Service: General;  Laterality: N/A;  . COLON SURGERY  04/2002   Dr. Deon Pilling - Subtotal colectomy, ileostomy  . HERNIA REPAIR    . Ileostomy Takedown  08/2002   Dr. Deon Pilling  . INCISION AND DRAINAGE FOOT    . INCISIONAL HERNIA REPAIR N/A 01/20/2013   Procedure: HERNIA REPAIR INCISIONAL;  Surgeon: Zenovia Jarred, MD;  Location: [place];  Service: General;  Laterality: N/A;  . [place]  04/13/2014   with mesh    dr Grandville Silos  . INCISIONAL HERNIA REPAIR N/A 04/13/2014   Procedure: OPEN REPAIR INCISIONAL HERNIA ;  Surgeon: Georganna Skeans, MD;  Location: [place];  Service: General;  Laterality: N/A;  . INSERTION OF MESH N/A 04/13/2014   Procedure: INSERTION OF MESH;  Surgeon: Georganna Skeans, MD;  Location: [place];  Service: General;  Laterality: N/A;  . KNEE ARTHROSCOPY Right 2010  . LAPAROSCOPIC CHOLECYSTECTOMY  10/16/2014  . LAPAROTOMY N/A 01/20/2013   Procedure: EXPLORATORY LAPAROTOMY;  Surgeon: Zenovia Jarred, MD;  Location: [place];  Service: General;  Laterality: N/A;  . Laparotomy with lysis of adhesions  2010   Dr. Grandville Silos - [facility]  . LYSIS OF ADHESION N/A 01/20/2013   Procedure: LYSIS OF ADHESION;  Surgeon: Merri Ray  Grandville Silos, MD;  Location: [place];  Service: General;  Laterality: N/A;  . microsurgical  03/24/2002   Lateral recess decompression L5  . SACRAL NERVE STIMULATOR PLACEMENT     inserted and removed  . SHOULDER ARTHROSCOPY Bilateral   . VASECTOMY       reports that he quit smoking about 36 years ago. He has quit using smokeless tobacco. His smokeless tobacco use included Chew. He reports that he does not drink alcohol or use drugs.  No Known Allergies  Family history.  Father- brain  cancer Mother heart disease.  Prior to Admission medications   Medication Sig Start Date End Date Taking? Authorizing Provider  HYDROcodone-acetaminophen (NORCO) 10-325 MG per tablet Take 1 tablet by mouth every 4 (four) hours as needed. Patient taking differently: Take 1 tablet by mouth every 8 (eight) hours as needed for moderate pain.  10/17/14  Yes Georganna Skeans, MD  lidocaine (LIDODERM) 5 % Place 1 patch onto the skin daily as needed (for pain). Remove & Discard patch within 12 hours or as directed by MD   Yes Historical Provider, MD  methocarbamol (ROBAXIN) 500 MG tablet Take 1,000 mg by mouth 2 (two) times daily.    Yes Historical Provider, MD  omeprazole (PRILOSEC) 20 MG capsule Take 20 mg by mouth daily. 02/03/16  Yes Historical Provider, MD  oxyCODONE (OXYCONTIN) 20 MG 12 hr tablet Take 20 mg by mouth every 12 (twelve) hours.   Yes Historical Provider, MD  HYDROcodone-acetaminophen (NORCO/VICODIN) 5-325 MG per tablet Take 1 tablet by mouth every 4 (four) hours as needed (pain). Patient not taking: Reported on 02/13/2016 04/19/14   Georganna Skeans, MD  psyllium (HYDROCIL/METAMUCIL) 95 % PACK Take 1 packet by mouth daily. Patient not taking: Reported on 10/04/2014 08/20/13   Earnstine Regal, PA-C  tamsulosin (FLOMAX) 0.4 MG CAPS capsule Take 1 capsule (0.4 mg total) by mouth daily. Patient not taking: Reported on 10/04/2014 04/19/14   Georganna Skeans, MD    Physical Exam: Vitals:   02/13/16 2015 02/13/16 2030 02/13/16 2038 02/13/16 2103  BP: 130/59 (!) 122/52  (!) 144/90  Pulse: 70 70  81  Resp: 17 16  16   Temp:   98 F (36.7 C) 97.6 F (36.4 C)  TempSrc:   Oral Oral  SpO2: 95% 100%  99%  Weight:   76.4 kg (168 lb 8 oz)   Height:   5\' 11"  (1.803 m)       Constitutional: NAD, calm, comfortable Vitals:   02/13/16 2015 02/13/16 2030 02/13/16 2038 02/13/16 2103  BP: 130/59 (!) 122/52  (!) 144/90  Pulse: 70 70  81  Resp: 17 16  16   Temp:   98 F (36.7 C) 97.6 F (36.4 C)   TempSrc:   Oral Oral  SpO2: 95% 100%  99%  Weight:   76.4 kg (168 lb 8 oz)   Height:   5\' 11"  (1.803 m)    Eyes: PERRL, lids and conjunctivae normal ENMT: Mucous membranes are moist. Posterior pharynx clear of any exudate or lesions.Normal dentition.  Neck: normal, supple, no masses, no thyromegaly Respiratory: clear to auscultation bilaterally, no wheezing, no crackles. Normal respiratory effort. No accessory muscle use.  Cardiovascular: Regular rate and rhythm, no murmurs / rubs / gallops. No extremity edema. 2+ pedal pulses. No carotid bruits.  Abdomen: no tenderness, no masses palpated. No hepatosplenomegaly. Bowel sounds positive.  Musculoskeletal: no clubbing / cyanosis. No joint deformity upper and lower extremities. Good ROM, no contractures. Normal muscle tone.  Skin: no rashes, lesions, ulcers. No induration Neurologic: CN 2-12 grossly intact. Sensation intact, DTR normal. Strength 5/5 in all 4.  Psychiatric: Normal judgment and insight. Alert and oriented x 4. Normal mood.    Labs on Admission: I have personally reviewed following labs and imaging studies  CBC:  Recent Labs Lab 02/13/16 1345  WBC 4.4  HGB 13.0  HCT 39.2  MCV 87.7  PLT 99991111   Basic Metabolic Panel:  Recent Labs Lab 02/13/16 1345  NA 137  K 4.6  CL 101  CO2 29  GLUCOSE 107*  BUN 11  CREATININE 1.03  CALCIUM 9.8   GFR: Estimated Creatinine Clearance: 68 mL/min (by C-G formula based on SCr of 1.03 mg/dL). Liver Function Tests:  Recent Labs Lab 02/13/16 1345  AST 107*  ALT 107*  ALKPHOS 88  BILITOT 0.9  PROT 7.2  ALBUMIN 4.2    Recent Labs Lab 02/13/16 1345  LIPASE 23   Urine analysis:    Component Value Date/Time   COLORURINE YELLOW 02/13/2016 [place] 02/13/2016 1604   LABSPEC 1.016 02/13/2016 1604   PHURINE 6.0 02/13/2016 1604   GLUCOSEU NEGATIVE 02/13/2016 1604   HGBUR NEGATIVE 02/13/2016 [place] 02/13/2016 [place] 02/13/2016 1604   PROTEINUR NEGATIVE 02/13/2016 1604   UROBILINOGEN 0.2 10/04/2014 2223   NITRITE NEGATIVE 02/13/2016 1604   LEUKOCYTESUR NEGATIVE 02/13/2016 1604    Radiological Exams on Admission: Ct Abdomen Pelvis W Contrast  Result Date: 02/13/2016 CLINICAL DATA:  Upper abdominal pain several days. EXAM: CT ABDOMEN AND PELVIS WITH CONTRAST TECHNIQUE: Multidetector CT imaging of the abdomen and pelvis was performed using the standard protocol following bolus administration of intravenous contrast. CONTRAST:  18mL ISOVUE-300 IOPAMIDOL (ISOVUE-300) INJECTION 61% COMPARISON:  06/26/2015, 03/16/2015 and 08/12/2013 FINDINGS: Lower chest:  Lung bases are unremarkable. Hepatobiliary: Previous cholecystectomy. Common bile duct measures 1.1 cm likely due to the post cholecystectomy state. Liver is within normal. Pancreas: No mass, inflammatory changes, or other significant abnormality. Spleen: Within normal limits in size and appearance. Adrenals/Urinary Tract: Adrenal glands are within normal. Kidneys are normal in size without hydronephrosis or nephrolithiasis. Visualize ureters are within normal. Bladder is within normal. Stomach/Bowel: Stomach is within normal. Small bowel is unremarkable. There is a surgical suture line over the junction of the descending to sigmoid colon in the left lower quadrant. Appendix is not seen. No significant free fluid or focal inflammation. Vascular/Lymphatic: No pathologically enlarged lymph nodes. No evidence of abdominal aortic aneurysm. Mild calcified plaque over the abdominal aorta. Reproductive: No mass or other significant abnormality. Other: Evidence of patient's previous abdominal wall hernia repair unchanged. Musculoskeletal:  Remaining bones and soft tissues are unchanged. IMPRESSION: No acute findings in the abdomen/pelvis. Previous abdominal wall hernia repair stable. Other postsurgical changes including previous cholecystectomy and previous colon surgery  which are stable. Electronically Signed   By: Marin Olp M.D.   On: 02/13/2016 18:42    Assessment/Plan Principal Problem:   Intractable abdominal pain   History of colectomy and multiple SBO No signs of small bowel obstruction at this time. Admit to MedSurg/observation. Keep nothing by mouth. Continue analgesics as needed. Continue antiemetics as needed. Famotidine 20 mg IVP every 12 hours. Continue IV fluids. Follow-up CBC, CMP and abdominal x-ray in the morning. Consults surgery if needed.  Active Problems:   GERD (gastroesophageal reflux disease) On famotidine 20 mg IVP every 12 hours    Chronic back pain Resume OxyContin and hydrocodone  as needed once clear for oral intake.    DVT prophylaxis: SCDs. Code Status: Full code. Family Communication: None. Disposition Plan: Admit for IV hydration and symptoms treatment. Consults called: Case was discussed with surgery by ED. Reconsult if needed. Admission status: Observation/MedSurg.   Reubin Milan MD [facility] Pager [phone].  If 7PM-7AM, please contact night-coverage [email or web address] Password [facility]  02/13/2016, 10:59 PM

## 2016-02-13 NOTE — ED Notes (Signed)
Patient transported to CT 

## 2016-02-13 NOTE — ED Provider Notes (Signed)
[facility] DEPT Provider Note   CSN: [id number] Arrival date & time: 02/13/16  1330     History   Chief Complaint Chief Complaint  Patient presents with  . Abdominal Pain    HPI Kent [name] is a 75 y.o. male.  Patient is a 75 yo M with a complicated history including subtotal colectomy s/p ileostomy take down, adhesion surgeries, laparoscopic cholecystectomy, hernia repair and multiple episodes of SBO who presents today with complaint of abdominal pain that start on Sunday. Pain is sharp in nature and intermittent. Started in his LLQ and now diffuse bilateral lower quadrants. He reports irregular BMs, last BM yesterday at 11pm. He endorses nausea and has been taking nexium and tums. No flatus. Denies emesis. He has not eaten anything since yesterday. Patient says this is the same pain he had for his prior SBOs.      Past Medical History:  Diagnosis Date  . 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 Cancer ([place])    basal cell cancer - skin  . Chronic back pain    s/p back surgery  . GERD (gastroesophageal reflux disease)   . GERD (gastroesophageal reflux disease) 08/18/2013  . Incisional hernia   . Sinus infection    currently on antibiotics  . Small bowel obstruction due to adhesions ([place])   . Small bowel obstruction due to adhesions [facility])     Patient Active Problem List   Diagnosis Date Noted  . History of colectomy 02/13/2016  . Vomiting 02/13/2016  . Intractable abdominal pain 02/13/2016  . S/P laparoscopic cholecystectomy 10/16/2014  . S/P hernia repair 04/13/2014  . Pre-op evaluation 03/24/2014  . H/O abdominal surgery 08/18/2013  . GERD (gastroesophageal reflux disease) 08/18/2013  . Chronic back pain 08/18/2013  . Sinus infection 08/18/2013  . SBO (small bowel obstruction) ([place]) 08/12/2013  . Incisional hernia, without obstruction or gangrene 02/02/2013    Past Surgical History:  Procedure Laterality Date  . BACK SURGERY  08/25/2001   Neck 3 disk  replacements  . BACK SURGERY     Placement/removal of nerve stimulator  . CHOLECYSTECTOMY N/A 10/16/2014   Procedure: LAPAROSCOPIC CHOLECYSTECTOMY WITH ATTEMPTED CHOLANGIOGRAM;  Surgeon: Georganna Skeans, MD;  Location: [place];  Service: General;  Laterality: N/A;  . COLON SURGERY  04/2002   Dr. Deon Pilling - Subtotal colectomy, ileostomy  . HERNIA REPAIR    . Ileostomy Takedown  08/2002   Dr. Deon Pilling  . INCISION AND DRAINAGE FOOT    . INCISIONAL HERNIA REPAIR N/A 01/20/2013   Procedure: HERNIA REPAIR INCISIONAL;  Surgeon: Zenovia Jarred, MD;  Location: [place];  Service: General;  Laterality: N/A;  . [place]  04/13/2014   with mesh    dr Grandville Silos  . INCISIONAL HERNIA REPAIR N/A 04/13/2014   Procedure: OPEN REPAIR INCISIONAL HERNIA ;  Surgeon: Georganna Skeans, MD;  Location: [place];  Service: General;  Laterality: N/A;  . INSERTION OF MESH N/A 04/13/2014   Procedure: INSERTION OF MESH;  Surgeon: Georganna Skeans, MD;  Location: [place];  Service: General;  Laterality: N/A;  . KNEE ARTHROSCOPY Right 2010  . LAPAROSCOPIC CHOLECYSTECTOMY  10/16/2014  . LAPAROTOMY N/A 01/20/2013   Procedure: EXPLORATORY LAPAROTOMY;  Surgeon: Zenovia Jarred, MD;  Location: [place];  Service: General;  Laterality: N/A;  . Laparotomy with lysis of adhesions  2010   Dr. Grandville Silos - SBO  . LYSIS OF ADHESION N/A 01/20/2013   Procedure: LYSIS OF ADHESION;  Surgeon: Zenovia Jarred, MD;  Location: [place];  Service: General;  Laterality: N/A;  . microsurgical  03/24/2002   Lateral recess decompression L5  . SACRAL NERVE STIMULATOR PLACEMENT     inserted and removed  . SHOULDER ARTHROSCOPY Bilateral   . VASECTOMY         Home Medications    Prior to Admission medications   Medication Sig Start Date End Date Taking? Authorizing Provider  HYDROcodone-acetaminophen (NORCO) 10-325 MG per tablet Take 1 tablet by mouth every 4 (four) hours as needed. Patient taking differently: Take 1 tablet by mouth every 8 (eight)  hours as needed for moderate pain.  10/17/14  Yes Georganna Skeans, MD  lidocaine (LIDODERM) 5 % Place 1 patch onto the skin daily as needed (for pain). Remove & Discard patch within 12 hours or as directed by MD   Yes Historical Provider, MD  methocarbamol (ROBAXIN) 500 MG tablet Take 1,000 mg by mouth 2 (two) times daily.    Yes Historical Provider, MD  omeprazole (PRILOSEC) 20 MG capsule Take 20 mg by mouth daily. 02/03/16  Yes Historical Provider, MD  oxyCODONE (OXYCONTIN) 20 MG 12 hr tablet Take 20 mg by mouth every 12 (twelve) hours.   Yes Historical Provider, MD  HYDROcodone-acetaminophen (NORCO/VICODIN) 5-325 MG per tablet Take 1 tablet by mouth every 4 (four) hours as needed (pain). Patient not taking: Reported on 02/13/2016 04/19/14   Georganna Skeans, MD  psyllium (HYDROCIL/METAMUCIL) 95 % PACK Take 1 packet by mouth daily. Patient not taking: Reported on 10/04/2014 08/20/13   Earnstine Regal, PA-C  tamsulosin (FLOMAX) 0.4 MG CAPS capsule Take 1 capsule (0.4 mg total) by mouth daily. Patient not taking: Reported on 10/04/2014 04/19/14   Georganna Skeans, MD    Family History Family History  Problem Relation Age of Onset  . Heart disease Mother   . Brain cancer Father     Social History Social History  Substance Use Topics  . Smoking status: Former Smoker    Quit date: 10/09/1979  . Smokeless tobacco: Former Systems developer    Types: Chew  . Alcohol use No     Allergies   Review of patient's allergies indicates no known allergies.   Review of Systems Review of Systems  All other systems reviewed and are negative.    Physical Exam Updated Vital Signs BP 107/64 (BP Location: Right Arm)   Pulse 68   Temp 98.6 F (37 C) (Oral)   Resp 16   Ht 5\' 11"  (1.803 m)   Wt 76.4 kg   SpO2 98%   BMI 23.50 kg/m   Physical Exam  Constitutional: He is oriented to person, place, and time. He appears well-developed and well-nourished.  HENT:  Head: Normocephalic and atraumatic.  Eyes:  Conjunctivae are normal.  Neck: Neck supple.  Cardiovascular: Normal rate and regular rhythm.   No murmur heard. Pulmonary/Chest: Effort normal and breath sounds normal. No respiratory distress.  Abdominal: Soft. He exhibits distension. There is no tenderness. There is no rebound and no guarding.  Multiple well healed incisions, very tender to palpation bilateral lower quadrants, diminished bowel sounds   Musculoskeletal: Normal range of motion. He exhibits no edema.  Neurological: He is alert and oriented to person, place, and time.  Skin: Skin is warm and dry.  Psychiatric: He has a normal mood and affect.  Nursing note and vitals reviewed.    ED Treatments / Results  Labs (all labs ordered are listed, but only abnormal results are displayed)  Labs Reviewed  COMPREHENSIVE METABOLIC PANEL - Abnormal; Notable for the following:       Result Value   Glucose, Bld 107 (*)    AST 107 (*)    ALT 107 (*)    All other components within normal limits  COMPREHENSIVE METABOLIC PANEL - Abnormal; Notable for the following:    Calcium 8.8 (*)    Total Protein 6.2 (*)    Albumin 3.3 (*)    AST 50 (*)    ALT 67 (*)    Anion gap 3 (*)    All other components within normal limits  CBC WITH DIFFERENTIAL/PLATELET - Abnormal; Notable for the following:    WBC 2.9 (*)    RBC 3.98 (*)    Hemoglobin 11.3 (*)    HCT 35.3 (*)    Platelets 137 (*)    Neutro Abs 1.6 (*)    All other components within normal limits  CBC - Abnormal; Notable for the following:    WBC 3.6 (*)    RBC 4.00 (*)    Hemoglobin 11.4 (*)    HCT 35.4 (*)    All other components within normal limits  LIPASE, BLOOD  CBC  URINALYSIS, ROUTINE W REFLEX MICROSCOPIC (NOT AT [facility])  MAGNESIUM  OCCULT BLOOD X 1 CARD TO LAB, STOOL    EKG  EKG Interpretation None       Radiology Ct Abdomen Pelvis W Contrast  Result Date: 02/13/2016 CLINICAL DATA:  Upper abdominal pain several days. EXAM: CT ABDOMEN AND PELVIS WITH  CONTRAST TECHNIQUE: Multidetector CT imaging of the abdomen and pelvis was performed using the standard protocol following bolus administration of intravenous contrast. CONTRAST:  162mL ISOVUE-300 IOPAMIDOL (ISOVUE-300) INJECTION 61% COMPARISON:  06/26/2015, 03/16/2015 and 08/12/2013 FINDINGS: Lower chest:  Lung bases are unremarkable. Hepatobiliary: Previous cholecystectomy. Common bile duct measures 1.1 cm likely due to the post cholecystectomy state. Liver is within normal. Pancreas: No mass, inflammatory changes, or other significant abnormality. Spleen: Within normal limits in size and appearance. Adrenals/Urinary Tract: Adrenal glands are within normal. Kidneys are normal in size without hydronephrosis or nephrolithiasis. Visualize ureters are within normal. Bladder is within normal. Stomach/Bowel: Stomach is within normal. Small bowel is unremarkable. There is a surgical suture line over the junction of the descending to sigmoid colon in the left lower quadrant. Appendix is not seen. No significant free fluid or focal inflammation. Vascular/Lymphatic: No pathologically enlarged lymph nodes. No evidence of abdominal aortic aneurysm. Mild calcified plaque over the abdominal aorta. Reproductive: No mass or other significant abnormality. Other: Evidence of patient's previous abdominal wall hernia repair unchanged. Musculoskeletal:  Remaining bones and soft tissues are unchanged. IMPRESSION: No acute findings in the abdomen/pelvis. Previous abdominal wall hernia repair stable. Other postsurgical changes including previous cholecystectomy and previous colon surgery which are stable. Electronically Signed   By: Marin Olp M.D.   On: 02/13/2016 18:42   Dg Abd 2 Views  Result Date: 02/14/2016 CLINICAL DATA:  Abdominal pain for several days EXAM: ABDOMEN - 2 VIEW COMPARISON:  CT abdomen and pelvis February 13, 2016 FINDINGS: Supine and upright images obtained. There is moderate stool throughout the colon. There is  no bowel dilatation or air-fluid level suggesting obstruction. No free air. There are surgical clips the right upper quadrant region. There is contrast in the urinary bladder. IMPRESSION: No bowel obstruction or free air evident. Clips right upper quadrant region. Electronically Signed   By: Lowella Grip III M.D.   On: 02/14/2016 07:24  Procedures Procedures (including critical care time)  Medications Ordered in ED Medications  ondansetron (ZOFRAN) injection 4 mg (4 mg Intravenous Given 02/14/16 1350)  0.9 %  sodium chloride infusion ( Intravenous Rate/Dose Change 02/14/16 1237)  famotidine (PEPCID) IVPB 20 mg premix (20 mg Intravenous Given 02/14/16 0904)  HYDROmorphone (DILAUDID) injection 2 mg (2 mg Intravenous Given 02/14/16 1329)  bisacodyl (DULCOLAX) suppository 10 mg (10 mg Rectal Given 02/14/16 1330)  sodium chloride 0.9 % bolus 1,000 mL (0 mLs Intravenous Stopped 02/13/16 1721)  ondansetron (ZOFRAN) injection 4 mg (4 mg Intravenous Given 02/13/16 1630)  morphine 4 MG/ML injection 4 mg (4 mg Intravenous Given 02/13/16 1630)  iopamidol (ISOVUE-300) 61 % injection (100 mLs  Contrast Given 02/13/16 1729)  ondansetron (ZOFRAN) injection 4 mg (4 mg Intravenous Given 02/13/16 1948)  morphine 4 MG/ML injection 4 mg (4 mg Intravenous Given 02/13/16 1948)  sodium chloride 0.9 % bolus 1,000 mL (1,000 mLs Intravenous New Bag/Given 02/13/16 1949)     Initial Impression / Assessment and Plan / ED Course  I have reviewed the triage vital signs and the nursing notes.  Pertinent labs & imaging results that were available during my care of the patient were reviewed by me and considered in my medical decision making (see chart for details).  Clinical Course   Abdominal pain: Intractable N/V and unable to tolerate PO. History of multiple SBOs. VSS. Labs (CMP, CBC, Lipase, UA)  all unremarkable. CT abdomen with no sings of SBO at this time. Admit to medicine. Surgery consult.   Final Clinical  Impressions(s) / ED Diagnoses   Final diagnoses:  Nausea and vomiting, vomiting of unspecified type    New Prescriptions Current Discharge Medication List       Velna Ochs, MD 02/14/16 Vaughn Yao, MD 02/16/16 2217

## 2016-02-13 NOTE — ED Triage Notes (Signed)
Pt reports yesterday has had abdominal burning, stinging, had a BM then loose stool. Sent here from Dr. Grandville Silos for possible bowel obstruction. Pt is a x 4. In NAD. Feels nauseated but no vomiting. Has a hx of several bowel obstructions.

## 2016-02-14 ENCOUNTER — Observation Stay (HOSPITAL_COMMUNITY): Payer: Medicare Other

## 2016-02-14 DIAGNOSIS — Z79899 Other long term (current) drug therapy: Secondary | ICD-10-CM | POA: Diagnosis not present

## 2016-02-14 DIAGNOSIS — Z85828 Personal history of other malignant neoplasm of skin: Secondary | ICD-10-CM | POA: Diagnosis not present

## 2016-02-14 DIAGNOSIS — R109 Unspecified abdominal pain: Secondary | ICD-10-CM

## 2016-02-14 DIAGNOSIS — Z87891 Personal history of nicotine dependence: Secondary | ICD-10-CM | POA: Diagnosis not present

## 2016-02-14 DIAGNOSIS — Z9049 Acquired absence of other specified parts of digestive tract: Secondary | ICD-10-CM | POA: Diagnosis not present

## 2016-02-14 DIAGNOSIS — M549 Dorsalgia, unspecified: Secondary | ICD-10-CM | POA: Diagnosis present

## 2016-02-14 DIAGNOSIS — K5660 Unspecified intestinal obstruction: Secondary | ICD-10-CM | POA: Diagnosis present

## 2016-02-14 DIAGNOSIS — K219 Gastro-esophageal reflux disease without esophagitis: Secondary | ICD-10-CM | POA: Diagnosis present

## 2016-02-14 DIAGNOSIS — R112 Nausea with vomiting, unspecified: Secondary | ICD-10-CM | POA: Diagnosis present

## 2016-02-14 DIAGNOSIS — G8929 Other chronic pain: Secondary | ICD-10-CM | POA: Diagnosis present

## 2016-02-14 LAB — CBC WITH DIFFERENTIAL/PLATELET
BASOS ABS: 0 10*3/uL (ref 0.0–0.1)
BASOS PCT: 1 %
Eosinophils Absolute: 0.1 10*3/uL (ref 0.0–0.7)
Eosinophils Relative: 4 %
HEMATOCRIT: 35.3 % — AB (ref 39.0–52.0)
HEMOGLOBIN: 11.3 g/dL — AB (ref 13.0–17.0)
LYMPHS PCT: 31 %
Lymphs Abs: 0.9 10*3/uL (ref 0.7–4.0)
MCH: 28.4 pg (ref 26.0–34.0)
MCHC: 32 g/dL (ref 30.0–36.0)
MCV: 88.7 fL (ref 78.0–100.0)
MONO ABS: 0.3 10*3/uL (ref 0.1–1.0)
Monocytes Relative: 10 %
NEUTROS ABS: 1.6 10*3/uL — AB (ref 1.7–7.7)
NEUTROS PCT: 55 %
Platelets: 137 10*3/uL — ABNORMAL LOW (ref 150–400)
RBC: 3.98 MIL/uL — AB (ref 4.22–5.81)
RDW: 13.1 % (ref 11.5–15.5)
WBC: 2.9 10*3/uL — ABNORMAL LOW (ref 4.0–10.5)

## 2016-02-14 LAB — CBC
HEMATOCRIT: 35.4 % — AB (ref 39.0–52.0)
HEMOGLOBIN: 11.4 g/dL — AB (ref 13.0–17.0)
MCH: 28.5 pg (ref 26.0–34.0)
MCHC: 32.2 g/dL (ref 30.0–36.0)
MCV: 88.5 fL (ref 78.0–100.0)
Platelets: 154 10*3/uL (ref 150–400)
RBC: 4 MIL/uL — AB (ref 4.22–5.81)
RDW: 13.2 % (ref 11.5–15.5)
WBC: 3.6 10*3/uL — AB (ref 4.0–10.5)

## 2016-02-14 LAB — COMPREHENSIVE METABOLIC PANEL
ALBUMIN: 3.3 g/dL — AB (ref 3.5–5.0)
ALT: 67 U/L — ABNORMAL HIGH (ref 17–63)
ANION GAP: 3 — AB (ref 5–15)
AST: 50 U/L — ABNORMAL HIGH (ref 15–41)
Alkaline Phosphatase: 70 U/L (ref 38–126)
BILIRUBIN TOTAL: 1.1 mg/dL (ref 0.3–1.2)
BUN: 9 mg/dL (ref 6–20)
CO2: 30 mmol/L (ref 22–32)
Calcium: 8.8 mg/dL — ABNORMAL LOW (ref 8.9–10.3)
Chloride: 108 mmol/L (ref 101–111)
Creatinine, Ser: 0.92 mg/dL (ref 0.61–1.24)
GFR calc non Af Amer: >60 mL/min (ref >60)
GLUCOSE: 93 mg/dL (ref 65–99)
POTASSIUM: 4 mmol/L (ref 3.5–5.1)
SODIUM: 141 mmol/L (ref 135–145)
TOTAL PROTEIN: 6.2 g/dL — AB (ref 6.5–8.1)

## 2016-02-14 LAB — MAGNESIUM: Magnesium: 1.9 mg/dL (ref 1.7–2.4)

## 2016-02-14 MED ORDER — BISACODYL 10 MG RE SUPP
10.0000 mg | Freq: Every day | RECTAL | Status: DC
  Administered 2016-02-14: 10 mg via RECTAL
  Filled 2016-02-14 (×3): qty 1

## 2016-02-14 NOTE — Care Management Obs Status (Signed)
[facility] NOTIFICATION   Patient Details  Name: LIEL BAUN MRN: [id number] Date of Birth: 12-06-1940   Medicare Observation Status Notification Given:  Yes    Erenest Rasher, RN 02/14/2016, 3:50 PM

## 2016-02-14 NOTE — Progress Notes (Signed)
PROGRESS NOTE    Kent [name]  [id number] DOB: [birth year] DOA: 02/13/2016 PCP: Shirline Frees, MD  Brief Narrative: Kent [name] is a 75yo male who presented to [facility] 02/13/16 with severe abdominal pain that began on Sunday. He reports abdominal swelling as well as a sharp pain in his lower abdomen that was initially intermittent. This resolved on Monday when he had an episode of diarrhea. The pain returned on Tuesday. He had a small soft bowel movement but this time the pain did not resolve. It is now constant and mostly in his lower abdomen. He has had some associated nausea.  He typically has 1 bowel movement every day and takes probiotics to help keep him regular.  Assessment & Plan:  1. Abdominal pain/N/V -symptoms sound like intermittent PSBO that is self resolving -h/o numerous abd surgeries and SBO -CT on admission last Pm negative -NPo, IVF for now, re-assess this afternoon -will ask [facility] to eval  2. GERD -continue H2 blocker  3. Chronic back pain -resume oxycontin when able  DVT prophylaxis: SCDs Code Status:Full code Family Communication: wife at bedside Disposition Plan: home when improved   Consultants:  CCS  Subjective: Still having an abd pain  Objective: Vitals:   02/13/16 2030 02/13/16 2038 02/13/16 2103 02/14/16 0557  BP: (!) 122/52  (!) 144/90 107/64  Pulse: 70  81 68  Resp: 16  16 16  Temp:  98 F (36.7 C) 97.6 F (36.4 C) 98.6 F (37 C)  TempSrc:  Oral Oral Oral  SpO2: 100%  99% 98%  Weight:  76.4 kg (168 lb 8 oz)    Height:  5\' 11" (1.803 m)      Intake/Output Summary (Last 24 hours) at 02/14/16 1234 Last data filed at 02/14/16 0602  Gross per 24 hour  Intake                0 ml  Output             15 00 ml  Net            -1500 ml   Filed Weights   02/13/16 2038  Weight: 76.4 kg (168 lb 8 oz)    Examination:  General exam: Appears calm and comfortable  Respiratory system: Clear to auscultation. Respiratory effort  normal. Cardiovascular system: S1 & S2 heard, RRR. No JVD, murmurs, rubs, gallops or clicks. No pedal edema. Gastrointestinal system: Abdomen is nondistended, soft and nontender. No organomegaly or masses felt. Normal bowel sounds heard. Central nervous system: Alert and oriented. No focal neurological deficits. Extremities: Symmetric 5 x 5 power. Skin: No rashes, lesions or ulcers Psychiatry: Judgement and insight appear normal. Mood & affect appropriate.     Data Reviewed: I have personally reviewed following labs and imaging studies  CBC:  Recent Labs Lab 02/13/16 1345 02/14/16 0459  WBC 4.4 2.9*  NEUTROABS  --  1.6*  HGB 13.0 11.3*  HCT 39.2 35.3*  MCV 87.7 88.7  PLT 191 0000000*   Basic Metabolic Panel:  Recent Labs Lab 02/13/16 1345 02/14/16 0459  NA 137 141  K 4.6 4.0  CL 101 108  CO2 29 30  GLUCOSE 107* 93  BUN 11 9  CREATININE 1.03 0.92  CALCIUM 9.8 8.8*  MG  --  1.9   GFR: Estimated Creatinine Clearance: 76.2 mL/min (by C-G formula based on SCr of 0.92 mg/dL). Liver Function Tests:  Recent Labs Lab 02/13/16 1345 02/14/16 0459  AST 107* 50*  ALT 107* 67*  ALKPHOS 88 70  BILITOT 0.9 1.1  PROT 7.2 6.2*  ALBUMIN 4.2 3.3*    Recent Labs Lab 02/13/16 1345  LIPASE 23   No results for input(s): AMMONIA in the last 168 hours. Coagulation Profile: No results for input(s): INR, PROTIME in the last 168 hours. Cardiac Enzymes: No results for input(s): CKTOTAL, CKMB, CKMBINDEX, TROPONINI in the last 168 hours. BNP (last 3 results) No results for input(s): PROBNP in the last 8760 hours. HbA1C: No results for input(s): HGBA1C in the last 72 hours. CBG: No results for input(s): GLUCAP in the last 168 hours. Lipid Profile: No results for input(s): CHOL, HDL, LDLCALC, TRIG, CHOLHDL, LDLDIRECT in the last 72 hours. Thyroid Function Tests: No results for input(s): TSH, T4TOTAL, FREET4, T3FREE, THYROIDAB in the last 72 hours. Anemia Panel: No results for  input(s): VITAMINB12, FOLATE, FERRITIN, TIBC, IRON, RETICCTPCT in the last 72 hours. Urine analysis:    Component Value Date/Time   COLORURINE YELLOW 02/13/2016 [place] 02/13/2016 1604   LABSPEC 1.016 02/13/2016 1604   PHURINE 6.0 02/13/2016 1604   GLUCOSEU NEGATIVE 02/13/2016 1604   HGBUR NEGATIVE 02/13/2016 1604   BILIRUBINUR NEGATIVE 02/13/2016 1604   KETONESUR NEGATIVE 02/13/2016 1604   PROTEINUR NEGATIVE 02/13/2016 1604   UROBILINOGEN 0.2 10/04/2014 2223   NITRITE NEGATIVE 02/13/2016 1604   LEUKOCYTESUR NEGATIVE 02/13/2016 1604   Sepsis Labs: @LABRCNTIP (procalcitonin:4,lacticidven:4)  )No results found for this or any previous visit (from the past 240 hour(s)).       Radiology Studies: Ct Abdomen Pelvis W Contrast  Result Date: 02/13/2016 CLINICAL DATA:  Upper abdominal pain several days. EXAM: CT ABDOMEN AND PELVIS WITH CONTRAST TECHNIQUE: Multidetector CT imaging of the abdomen and pelvis was performed using the standard protocol following bolus administration of intravenous contrast. CONTRAST:  132mL ISOVUE-300 IOPAMIDOL (ISOVUE-300) INJECTION 61% COMPARISON:  06/26/2015, 03/16/2015 and 08/12/2013 FINDINGS: Lower chest:  Lung bases are unremarkable. Hepatobiliary: Previous cholecystectomy. Common bile duct measures 1.1 cm likely due to the post cholecystectomy state. Liver is within normal. Pancreas: No mass, inflammatory changes, or other significant abnormality. Spleen: Within normal limits in size and appearance. Adrenals/Urinary Tract: Adrenal glands are within normal. Kidneys are normal in size without hydronephrosis or nephrolithiasis. Visualize ureters are within normal. Bladder is within normal. Stomach/Bowel: Stomach is within normal. Small bowel is unremarkable. There is a surgical suture line over the junction of the descending to sigmoid colon in the left lower quadrant. Appendix is not seen. No significant free fluid or focal inflammation.  Vascular/Lymphatic: No pathologically enlarged lymph nodes. No evidence of abdominal aortic aneurysm. Mild calcified plaque over the abdominal aorta. Reproductive: No mass or other significant abnormality. Other: Evidence of patient's previous abdominal wall hernia repair unchanged. Musculoskeletal:  Remaining bones and soft tissues are unchanged. IMPRESSION: No acute findings in the abdomen/pelvis. Previous abdominal wall hernia repair stable. Other postsurgical changes including previous cholecystectomy and previous colon surgery which are stable. Electronically Signed   By: Marin Olp M.D.   On: 02/13/2016 18:42   Dg Abd 2 Views  Result Date: 02/14/2016 CLINICAL DATA:  Abdominal pain for several days EXAM: ABDOMEN - 2 VIEW COMPARISON:  CT abdomen and pelvis February 13, 2016 FINDINGS: Supine and upright images obtained. There is moderate stool throughout the colon. There is no bowel dilatation or air-fluid level suggesting obstruction. No free air. There are surgical clips the right upper quadrant region. There is contrast in the urinary bladder. IMPRESSION: No bowel obstruction or free air evident. Clips  right upper quadrant region. Electronically Signed   By: Lowella Grip III M.D.   On: 02/14/2016 07:24        Scheduled Meds: . bisacodyl  10 mg Rectal Daily  . famotidine (PEPCID) IV  20 mg Intravenous Q12H   Continuous Infusions: . sodium chloride 100 mL/hr at 02/14/16 0904     LOS: 0 days    Time spent: 58min    Domenic Polite, MD [facility] Pager [phone]  If 7PM-7AM, please contact night-coverage [email or web address] Password [facility] 02/14/2016, 12:34 PM

## 2016-02-14 NOTE — Consult Note (Signed)
[facility] Surgery Consult Note  Kent [name] June 09, 1941  [id number].    Requesting MD: Dr. Tennis Must, internal medicine Chief Complaint/Reason for Consult: Abdominal pain  HPI:  Kent [name] is a 75yo male who presented to [facility] 02/13/16 with severe abdominal pain that began on Sunday. He reports abdominal swelling as well as a sharp pain in his lower abdomen that was initially intermittent. This resolved on Monday when he had an episode of diarrhea. The pain returned on Tuesday. He had a small soft bowel movement but this time the pain did not resolve. It is now constant and mostly in his lower abdomen. He has had some associated nausea.  He typically has 1 bowel movement every day and takes probiotics to help keep him regular. Last week he did notice that his stools were at times white/brown in color, and this most previous episode of diarrhea it was a little darker. Denies fevers, hematochezia, dysuria, or hematuria. He has never had this type of pain before. Last BM Tuesday morning. Last meal Tuesday evening.  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 Watt Climes about 1 year ago was benign   ROS: All systems reviewed and otherwise negative except for as above  Family History  Problem Relation Age of Onset  . Heart disease Mother   . Brain cancer Father     Past Medical History:  Diagnosis Date  . 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 Cancer ([place])    basal cell cancer - skin  . Chronic back pain    s/p back surgery  . GERD (gastroesophageal reflux disease)   . GERD (gastroesophageal reflux disease) 08/18/2013  . Incisional hernia   . Sinus infection    currently on antibiotics  . Small bowel obstruction due to  adhesions ([place])   . Small bowel obstruction due to adhesions [facility])     Past Surgical History:  Procedure Laterality Date  . BACK SURGERY  08/25/2001   Neck 3 disk replacements  . BACK SURGERY     Placement/removal of nerve stimulator  . CHOLECYSTECTOMY N/A 10/16/2014   Procedure: LAPAROSCOPIC CHOLECYSTECTOMY WITH ATTEMPTED CHOLANGIOGRAM;  Surgeon: Georganna Skeans, MD;  Location: [place];  Service: General;  Laterality: N/A;  . COLON SURGERY  04/2002   Dr. Deon Pilling - Subtotal colectomy, ileostomy  . HERNIA REPAIR    . Ileostomy Takedown  08/2002   Dr. Deon Pilling  . INCISION AND DRAINAGE FOOT    . INCISIONAL HERNIA REPAIR N/A 01/20/2013   Procedure: HERNIA REPAIR INCISIONAL;  Surgeon: Zenovia Jarred, MD;  Location: [place];  Service: General;  Laterality: N/A;  . [place]  04/13/2014   with mesh    dr Grandville Silos  . INCISIONAL HERNIA REPAIR N/A 04/13/2014   Procedure: OPEN REPAIR INCISIONAL HERNIA ;  Surgeon: Georganna Skeans, MD;  Location: [place];  Service: General;  Laterality: N/A;  . INSERTION OF MESH N/A 04/13/2014   Procedure: INSERTION OF MESH;  Surgeon: Georganna Skeans, MD;  Location: [place];  Service: General;  Laterality: N/A;  . KNEE ARTHROSCOPY Right 2010  . LAPAROSCOPIC CHOLECYSTECTOMY  10/16/2014  . LAPAROTOMY N/A 01/20/2013   Procedure: EXPLORATORY LAPAROTOMY;  Surgeon: Zenovia Jarred, MD;  Location: [place];  Service: General;  Laterality: N/A;  .  Laparotomy with lysis of adhesions  2010   Dr. Grandville Silos - [facility]  . LYSIS OF ADHESION N/A 01/20/2013   Procedure: LYSIS OF ADHESION;  Surgeon: Zenovia Jarred, MD;  Location: [place];  Service: General;  Laterality: N/A;  . microsurgical  03/24/2002   Lateral recess decompression L5  . SACRAL NERVE STIMULATOR PLACEMENT     inserted and removed  . SHOULDER ARTHROSCOPY Bilateral   . VASECTOMY      Social History:  reports that he quit smoking about 36 years ago. He has quit using smokeless tobacco. His smokeless tobacco use included  Chew. He reports that he does not drink alcohol or use drugs.  Allergies: No Known Allergies  Medications Prior to Admission  Medication Sig Dispense Refill  . HYDROcodone-acetaminophen (NORCO) 10-325 MG per tablet Take 1 tablet by mouth every 4 (four) hours as needed. (Patient taking differently: Take 1 tablet by mouth every 8 (eight) hours as needed for moderate pain. ) 40 tablet 0  . lidocaine (LIDODERM) 5 % Place 1 patch onto the skin daily as needed (for pain). Remove & Discard patch within 12 hours or as directed by MD    . methocarbamol (ROBAXIN) 500 MG tablet Take 1,000 mg by mouth 2 (two) times daily.     Marland Kitchen omeprazole (PRILOSEC) 20 MG capsule Take 20 mg by mouth daily.    Marland Kitchen oxyCODONE (OXYCONTIN) 20 MG 12 hr tablet Take 20 mg by mouth every 12 (twelve) hours.    Marland Kitchen HYDROcodone-acetaminophen (NORCO/VICODIN) 5-325 MG per tablet Take 1 tablet by mouth every 4 (four) hours as needed (pain). (Patient not taking: Reported on 02/13/2016) 50 tablet 0  . psyllium (HYDROCIL/METAMUCIL) 95 % PACK Take 1 packet by mouth daily. (Patient not taking: Reported on 10/04/2014) 56 each   . tamsulosin (FLOMAX) 0.4 MG CAPS capsule Take 1 capsule (0.4 mg total) by mouth daily. (Patient not taking: Reported on 10/04/2014) 7 capsule 0    Blood pressure 107/64, pulse 68, temperature 98.6 F (37 C), temperature source Oral, resp. rate 16, height _0  (1.803 m), weight 168 lb 8 oz (76.4 kg), SpO2 98 %. Physical Exam: General: pleasant, WD/WN white male who is laying in bed in NAD HEENT: head is normocephalic, atraumatic. Heart: regular, rate, and rhythm.  No obvious murmurs, gallops, or rubs noted.  Palpable pedal pulses bilaterally Lungs: CTAB, no wheezes, rhonchi, or rales noted.  Respiratory effort nonlabored Abd: soft, minimally distended, +BS. Several well healed surgical incisions. No palpable masses or hernias. Tender bilateral lower quadrants RLQ>LLQ. No rebound tenderness or guarding. MS: all 4  extremities are symmetrical with no cyanosis, clubbing, or edema. Skin: warm and dry with no masses, lesions, or rashes Psych: A&Ox3 with an appropriate affect.  Results for orders placed or performed during the hospital encounter of 02/13/16 (from the past 48 hour(s))  Lipase, blood     Status: None   Collection Time: 02/13/16  1:45 PM  Result Value Ref Range   Lipase 23 11 - 51 U/L  Comprehensive metabolic panel     Status: Abnormal   Collection Time: 02/13/16  1:45 PM  Result Value Ref Range   Sodium 137 135 - 145 mmol/L   Potassium 4.6 3.5 - 5.1 mmol/L   Chloride 101 101 - 111 mmol/L   CO2 29 22 - 32 mmol/L   Glucose, Bld 107 (H) 65 - 99 mg/dL   BUN 11 6 - 20 mg/dL   Creatinine, Ser 1.03 0.61 - 1.24 mg/dL  Calcium 9.8 8.9 - 10.3 mg/dL   Total Protein 7.2 6.5 - 8.1 g/dL   Albumin 4.2 3.5 - 5.0 g/dL   AST 107 (H) 15 - 41 U/L   ALT 107 (H) 17 - 63 U/L   Alkaline Phosphatase 88 38 - 126 U/L   Total Bilirubin 0.9 0.3 - 1.2 mg/dL   GFR calc non Af Amer >60 >60 mL/min   GFR calc Af Amer >60 >60 mL/min    Comment: (NOTE) The eGFR has been calculated using the CKD EPI equation. This calculation has not been validated in all clinical situations. eGFR's persistently <60 mL/min signify possible Chronic Kidney Disease.    Anion gap 7 5 - 15  CBC     Status: None   Collection Time: 02/13/16  1:45 PM  Result Value Ref Range   WBC 4.4 4.0 - 10.5 K/uL   RBC 4.47 4.22 - 5.81 MIL/uL   Hemoglobin 13.0 13.0 - 17.0 g/dL   HCT 39.2 39.0 - 52.0 %   MCV 87.7 78.0 - 100.0 fL   MCH 29.1 26.0 - 34.0 pg   MCHC 33.2 30.0 - 36.0 g/dL   RDW 13.1 11.5 - 15.5 %   Platelets 191 150 - 400 K/uL  Urinalysis, Routine w reflex microscopic     Status: None   Collection Time: 02/13/16  4:04 PM  Result Value Ref Range   Color, Urine YELLOW YELLOW   APPearance CLEAR CLEAR   Specific Gravity, Urine 1.016 1.005 - 1.030   pH 6.0 5.0 - 8.0   Glucose, UA NEGATIVE NEGATIVE mg/dL   Hgb urine dipstick  NEGATIVE NEGATIVE   Bilirubin Urine NEGATIVE NEGATIVE   Ketones, ur NEGATIVE NEGATIVE mg/dL   Protein, ur NEGATIVE NEGATIVE mg/dL   Nitrite NEGATIVE NEGATIVE   Leukocytes, UA NEGATIVE NEGATIVE    Comment: MICROSCOPIC NOT DONE ON URINES WITH NEGATIVE PROTEIN, BLOOD, LEUKOCYTES, NITRITE, OR GLUCOSE <1000 mg/dL.  Comprehensive metabolic panel     Status: Abnormal   Collection Time: 02/14/16  4:59 AM  Result Value Ref Range   Sodium 141 135 - 145 mmol/L   Potassium 4.0 3.5 - 5.1 mmol/L   Chloride 108 101 - 111 mmol/L   CO2 30 22 - 32 mmol/L   Glucose, Bld 93 65 - 99 mg/dL   BUN 9 6 - 20 mg/dL   Creatinine, Ser 0.92 0.61 - 1.24 mg/dL   Calcium 8.8 (L) 8.9 - 10.3 mg/dL   Total Protein 6.2 (L) 6.5 - 8.1 g/dL   Albumin 3.3 (L) 3.5 - 5.0 g/dL   AST 50 (H) 15 - 41 U/L   ALT 67 (H) 17 - 63 U/L   Alkaline Phosphatase 70 38 - 126 U/L   Total Bilirubin 1.1 0.3 - 1.2 mg/dL   GFR calc non Af Amer >60 >60 mL/min   GFR calc Af Amer >60 >60 mL/min    Comment: (NOTE) The eGFR has been calculated using the CKD EPI equation. This calculation has not been validated in all clinical situations. eGFR's persistently <60 mL/min signify possible Chronic Kidney Disease.    Anion gap 3 (L) 5 - 15  CBC WITH DIFFERENTIAL     Status: Abnormal   Collection Time: 02/14/16  4:59 AM  Result Value Ref Range   WBC 2.9 (L) 4.0 - 10.5 K/uL   RBC 3.98 (L) 4.22 - 5.81 MIL/uL   Hemoglobin 11.3 (L) 13.0 - 17.0 g/dL   HCT 35.3 (L) 39.0 - 52.0 %  MCV 88.7 78.0 - 100.0 fL   MCH 28.4 26.0 - 34.0 pg   MCHC 32.0 30.0 - 36.0 g/dL   RDW 13.1 11.5 - 15.5 %   Platelets 137 (L) 150 - 400 K/uL   Neutrophils Relative % 55 %   Neutro Abs 1.6 (L) 1.7 - 7.7 K/uL   Lymphocytes Relative 31 %   Lymphs Abs 0.9 0.7 - 4.0 K/uL   Monocytes Relative 10 %   Monocytes Absolute 0.3 0.1 - 1.0 K/uL   Eosinophils Relative 4 %   Eosinophils Absolute 0.1 0.0 - 0.7 K/uL   Basophils Relative 1 %   Basophils Absolute 0.0 0.0 - 0.1 K/uL   Magnesium     Status: None   Collection Time: 02/14/16  4:59 AM  Result Value Ref Range   Magnesium 1.9 1.7 - 2.4 mg/dL   Ct Abdomen Pelvis W Contrast  Result Date: 02/13/2016 CLINICAL DATA:  Upper abdominal pain several days. EXAM: CT ABDOMEN AND PELVIS WITH CONTRAST TECHNIQUE: Multidetector CT imaging of the abdomen and pelvis was performed using the standard protocol following bolus administration of intravenous contrast. CONTRAST:  147m ISOVUE-300 IOPAMIDOL (ISOVUE-300) INJECTION 61% COMPARISON:  06/26/2015, 03/16/2015 and 08/12/2013 FINDINGS: Lower chest:  Lung bases are unremarkable. Hepatobiliary: Previous cholecystectomy. Common bile duct measures 1.1 cm likely due to the post cholecystectomy state. Liver is within normal. Pancreas: No mass, inflammatory changes, or other significant abnormality. Spleen: Within normal limits in size and appearance. Adrenals/Urinary Tract: Adrenal glands are within normal. Kidneys are normal in size without hydronephrosis or nephrolithiasis. Visualize ureters are within normal. Bladder is within normal. Stomach/Bowel: Stomach is within normal. Small bowel is unremarkable. There is a surgical suture line over the junction of the descending to sigmoid colon in the left lower quadrant. Appendix is not seen. No significant free fluid or focal inflammation. Vascular/Lymphatic: No pathologically enlarged lymph nodes. No evidence of abdominal aortic aneurysm. Mild calcified plaque over the abdominal aorta. Reproductive: No mass or other significant abnormality. Other: Evidence of patient's previous abdominal wall hernia repair unchanged. Musculoskeletal:  Remaining bones and soft tissues are unchanged. IMPRESSION: No acute findings in the abdomen/pelvis. Previous abdominal wall hernia repair stable. Other postsurgical changes including previous cholecystectomy and previous colon surgery which are stable. Electronically Signed   By: DMarin OlpM.D.   On: 02/13/2016 18:42    Dg Abd 2 Views  Result Date: 02/14/2016 CLINICAL DATA:  Abdominal pain for several days EXAM: ABDOMEN - 2 VIEW COMPARISON:  CT abdomen and pelvis February 13, 2016 FINDINGS: Supine and upright images obtained. There is moderate stool throughout the colon. There is no bowel dilatation or air-fluid level suggesting obstruction. No free air. There are surgical clips the right upper quadrant region. There is contrast in the urinary bladder. IMPRESSION: No bowel obstruction or free air evident. Clips right upper quadrant region. Electronically Signed   By: WLowella GripIII M.D.   On: 02/14/2016 07:24      Assessment/Plan 1.  General surgery consulted to evaluate abdominal pain. Per history sounds like partial SBO's that resolve spontaneously, but CT scan normal. Patient continues to complain of diffuse lower abdominal pain and swelling.  2.  Hemoglobin did drop today 11.3 from 13.0. Will recheck CBC. Test hemoccult. 3.  Will discuss with MD.  4.  Keep NPO for now. Add daily suppository.  BJerrye Beavers [facility] 02/14/2016, 9:18 AM Pager: [phone]: [phone]-Fri 7:00 am-4:30 pm Sat-Sun 7:00 am-11:30 am

## 2016-02-15 LAB — CBC
HEMATOCRIT: 33.4 % — AB (ref 39.0–52.0)
Hemoglobin: 10.9 g/dL — ABNORMAL LOW (ref 13.0–17.0)
MCH: 28.7 pg (ref 26.0–34.0)
MCHC: 32.6 g/dL (ref 30.0–36.0)
MCV: 87.9 fL (ref 78.0–100.0)
Platelets: 149 10*3/uL — ABNORMAL LOW (ref 150–400)
RBC: 3.8 MIL/uL — ABNORMAL LOW (ref 4.22–5.81)
RDW: 13 % (ref 11.5–15.5)
WBC: 3.7 10*3/uL — AB (ref 4.0–10.5)

## 2016-02-15 LAB — COMPREHENSIVE METABOLIC PANEL
ALK PHOS: 73 U/L (ref 38–126)
ALT: 52 U/L (ref 17–63)
AST: 36 U/L (ref 15–41)
Albumin: 3.2 g/dL — ABNORMAL LOW (ref 3.5–5.0)
Anion gap: 8 (ref 5–15)
BILIRUBIN TOTAL: 1.4 mg/dL — AB (ref 0.3–1.2)
BUN: 11 mg/dL (ref 6–20)
CALCIUM: 8.8 mg/dL — AB (ref 8.9–10.3)
CO2: 26 mmol/L (ref 22–32)
CREATININE: 0.96 mg/dL (ref 0.61–1.24)
Chloride: 106 mmol/L (ref 101–111)
GFR calc Af Amer: >60 mL/min (ref >60)
Glucose, Bld: 66 mg/dL (ref 65–99)
POTASSIUM: 3.6 mmol/L (ref 3.5–5.1)
Sodium: 140 mmol/L (ref 135–145)
TOTAL PROTEIN: 5.7 g/dL — AB (ref 6.5–8.1)

## 2016-02-15 LAB — OCCULT BLOOD X 1 CARD TO LAB, STOOL: Fecal Occult Bld: POSITIVE — AB

## 2016-02-15 MED ORDER — WHITE PETROLATUM GEL
Status: AC
  Administered 2016-02-15: 14:00:00
  Filled 2016-02-15: qty 1

## 2016-02-15 MED ORDER — POLYETHYLENE GLYCOL 3350 17 G PO PACK: 17.0000 g | PACK | Freq: Every day | ORAL | 0 refills | Status: DC | PRN

## 2016-02-15 MED ORDER — SORBITOL 70 % SOLN
960.0000 mL | TOPICAL_OIL | Freq: Once | ORAL | Status: DC | PRN
  Filled 2016-02-15: qty 240

## 2016-02-15 MED ORDER — OXYCODONE HCL ER 15 MG PO T12A
15.0000 mg | EXTENDED_RELEASE_TABLET | Freq: Two times a day (BID) | ORAL | Status: DC
  Administered 2016-02-15: 15 mg via ORAL
  Filled 2016-02-15: qty 1

## 2016-02-15 MED ORDER — FAMOTIDINE IN NACL 20-0.9 MG/50ML-% IV SOLN: 20.0000 mg | Freq: Two times a day (BID) | INTRAVENOUS | Status: DC

## 2016-02-15 MED ORDER — FAMOTIDINE 20 MG PO TABS: 20.0000 mg | ORAL_TABLET | Freq: Two times a day (BID) | ORAL | Status: DC

## 2016-02-15 NOTE — Progress Notes (Signed)
Feels slightly better this am. Tried suppository with small amount of output.   Vitals:   02/14/16 2142 02/15/16 0537  BP: 135/66 (!) 143/60  Pulse: 71 75  Resp: 19 19  Temp: 98 F (36.7 C) 98.1 F (36.7 C)    CBC Latest Ref Rng & Units 02/15/2016 02/14/2016 02/14/2016  WBC 4.0 - 10.5 K/uL 3.7(L) 3.6(L) 2.9(L)  Hemoglobin 13.0 - 17.0 g/dL 10.9(L) 11.4(L) 11.3(L)  Hematocrit 39.0 - 52.0 % 33.4(L) 35.4(L) 35.3(L)  Platelets 150 - 400 K/uL 149(L) 154 137(L)    BMP Latest Ref Rng & Units 02/15/2016 02/14/2016 02/13/2016  Glucose 65 - 99 mg/dL 66 93 107(H)  BUN 6 - 20 mg/dL 11 9 11   Creatinine 0.61 - 1.24 mg/dL 0.96 0.92 1.03  Sodium 135 - 145 mmol/L 140 141 137  Potassium 3.5 - 5.1 mmol/L 3.6 4.0 4.6  Chloride 101 - 111 mmol/L 106 108 101  CO2 22 - 32 mmol/L 26 30 29   Calcium 8.9 - 10.3 mg/dL 8.8(L) 8.8(L) 9.8          Intake/Output Summary (Last 24 hours) at 02/15/16 0803 Last data filed at 02/15/16 0555  Gross per 24 hour  Intake          2795.83 ml  Output                0 ml  Net          2795.83 ml    Aox3 no distress Unlabored resp Abdomen soft, minimally distended, nontender Ext WWP  A/P: Continue supportive care. Enema PRN ordered. OK to try sips of clears.

## 2016-02-15 NOTE — Progress Notes (Signed)
Patient discharged to home. Discharge instruction (AVS) was given to patient. Patient agreed and verbalized understanding. No further questions at this moment. Patient going home via private automobile accompanied by his son.  Kent [name] n 3:05 PM 02/15/16

## 2016-03-17 NOTE — Discharge Summary (Signed)
Physician Discharge Summary  Kent [name] [id number] DOB: 01-23-1941 DOA: 02/13/2016  PCP: Shirline Frees, MD  Admit date: 02/13/2016 Discharge date: 02/15/2016  Time spent: 45 minutes  Recommendations for Outpatient Follow-up:  1. PCP Dr.Harris in 1 week   Discharge Diagnoses:  Principal Problem:   Intractable abdominal pain Active Problems:   GERD (gastroesophageal reflux disease)   Chronic back pain   History of colectomy   Discharge Condition: stable  Diet recommendation: soft diet  Filed Weights   02/13/16 2038  Weight: 76.4 kg (168 lb 8 oz)    History of present illness:   Kent [name] is a 75 y.o. 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  Hospital Course:  . Abdominal pain/N/V -symptoms sound like intermittent PSBO that is self resolving -h/o numerous abd surgeries and SBO -CT on admission last Pm negative -improved with bowel rest, IVF, supportive care -seen by Dr.COnner nothing additional felt to be needed at this time, discharged home once improved and tolerating diet  2. GERD -continue H2 blocker  3. Chronic back pain -resumed oxycontin   Consultations:  [facility] Dr.Conner  Discharge Exam: Vitals:   02/15/16 0537 02/15/16 1355  BP: (!) 143/60 123/63  Pulse: 75 81  Resp: 19 19  Temp: 98.1 F (36.7 C) 98.2 F (36.8 C)    General: AAOx3 Cardiovascular: S1S2/RRR Respiratory: CTAB  Discharge Instructions   Discharge Instructions    Diet - low sodium heart healthy    Complete by:  As directed    Increase activity slowly    Complete by:  As directed      Discharge Medication List as of 02/15/2016  2:59 PM    START taking these medications   Details   polyethylene glycol (MIRALAX / GLYCOLAX) packet Take 17 g by mouth daily as needed for mild constipation., Starting Fri 02/15/2016, Print      CONTINUE these medications which have NOT CHANGED   Details  lidocaine (LIDODERM) 5 % Place 1 patch onto the skin daily as needed (for pain). Remove & Discard patch within 12 hours or as directed by MD, Historical Med    methocarbamol (ROBAXIN) 500 MG tablet Take 1,000 mg by mouth 2 (two) times daily. , Until Discontinued, Historical Med    omeprazole (PRILOSEC) 20 MG capsule Take 20 mg by mouth daily., Starting Sun 02/03/2016, Historical Med    oxyCODONE (OXYCONTIN) 20 MG 12 hr tablet Take 20 mg by mouth every 12 (twelve) hours., Until Discontinued, Historical Med    tamsulosin (FLOMAX) 0.4 MG CAPS capsule Take 1 capsule (0.4 mg total) by mouth daily., Starting 04/19/2014, Until Discontinued, Print      STOP taking these medications     HYDROcodone-acetaminophen (NORCO) 10-325 MG per tablet      HYDROcodone-acetaminophen (NORCO/VICODIN) 5-325 MG per tablet      psyllium (HYDROCIL/METAMUCIL) 95 % PACK        No Known Allergies Follow-up Information    Shirline Frees, MD. Schedule an appointment as soon as possible for a visit in 1 week(s).   Specialty:  Family Medicine Contact information: [place] [address] [phone]            The results of significant diagnostics from this hospitalization (including imaging,  microbiology, ancillary and laboratory) are listed below for reference.    Significant Diagnostic Studies: No results found.  Microbiology: No results found for this or any previous visit (from the past 240 hour(s)).   Labs: Basic Metabolic Panel: No results for input(s): NA, K, CL, CO2, GLUCOSE, BUN, CREATININE, CALCIUM, MG, PHOS in the last 168 hours. Liver Function Tests: No results for input(s): AST, ALT, ALKPHOS, BILITOT, PROT, ALBUMIN in the last 168 hours. No results for  input(s): LIPASE, AMYLASE in the last 168 hours. No results for input(s): AMMONIA in the last 168 hours. CBC: No results for input(s): WBC, NEUTROABS, HGB, HCT, MCV, PLT in the last 168 hours. Cardiac Enzymes: No results for input(s): CKTOTAL, CKMB, CKMBINDEX, TROPONINI in the last 168 hours. BNP: BNP (last 3 results) No results for input(s): BNP in the last 8760 hours.  ProBNP (last 3 results) No results for input(s): PROBNP in the last 8760 hours.  CBG: No results for input(s): GLUCAP in the last 168 hours.     SignedDomenic Polite MD.  [facility] 03/17/2016, 2:01 PM

## 2016-09-01 ENCOUNTER — Emergency Department (HOSPITAL_COMMUNITY): Payer: Medicare Other

## 2016-09-01 ENCOUNTER — Inpatient Hospital Stay (HOSPITAL_COMMUNITY)
Admission: EM | Admit: 2016-09-01 | Discharge: 2016-09-04 | DRG: 390 | Disposition: A | Discharge status: Home / Self Care | Payer: Medicare Other | Attending: Internal Medicine | Admitting: Internal Medicine

## 2016-09-01 ENCOUNTER — Encounter (HOSPITAL_COMMUNITY): Payer: Self-pay | Admitting: *Deleted

## 2016-09-01 DIAGNOSIS — K5651 Intestinal adhesions [bands], with partial obstruction: Secondary | ICD-10-CM | POA: Diagnosis present

## 2016-09-01 DIAGNOSIS — Z87891 Personal history of nicotine dependence: Secondary | ICD-10-CM | POA: Diagnosis not present

## 2016-09-01 DIAGNOSIS — K219 Gastro-esophageal reflux disease without esophagitis: Secondary | ICD-10-CM | POA: Diagnosis not present

## 2016-09-01 DIAGNOSIS — Z79891 Long term (current) use of opiate analgesic: Secondary | ICD-10-CM | POA: Diagnosis not present

## 2016-09-01 DIAGNOSIS — D72825 Bandemia: Secondary | ICD-10-CM

## 2016-09-01 DIAGNOSIS — R110 Nausea: Secondary | ICD-10-CM | POA: Diagnosis present

## 2016-09-01 DIAGNOSIS — D72829 Elevated white blood cell count, unspecified: Secondary | ICD-10-CM | POA: Diagnosis present

## 2016-09-01 DIAGNOSIS — R42 Dizziness and giddiness: Secondary | ICD-10-CM | POA: Diagnosis not present

## 2016-09-01 DIAGNOSIS — G8929 Other chronic pain: Secondary | ICD-10-CM | POA: Diagnosis present

## 2016-09-01 DIAGNOSIS — M479 Spondylosis, unspecified: Secondary | ICD-10-CM | POA: Diagnosis present

## 2016-09-01 DIAGNOSIS — R9431 Abnormal electrocardiogram [ECG] [EKG]: Secondary | ICD-10-CM | POA: Diagnosis present

## 2016-09-01 DIAGNOSIS — Z85828 Personal history of other malignant neoplasm of skin: Secondary | ICD-10-CM | POA: Diagnosis not present

## 2016-09-01 DIAGNOSIS — R1084 Generalized abdominal pain: Secondary | ICD-10-CM | POA: Diagnosis present

## 2016-09-01 DIAGNOSIS — R55 Syncope and collapse: Secondary | ICD-10-CM

## 2016-09-01 DIAGNOSIS — E860 Dehydration: Secondary | ICD-10-CM | POA: Diagnosis not present

## 2016-09-01 DIAGNOSIS — R748 Abnormal levels of other serum enzymes: Secondary | ICD-10-CM | POA: Diagnosis present

## 2016-09-01 DIAGNOSIS — M549 Dorsalgia, unspecified: Secondary | ICD-10-CM | POA: Diagnosis present

## 2016-09-01 DIAGNOSIS — Z79899 Other long term (current) drug therapy: Secondary | ICD-10-CM

## 2016-09-01 DIAGNOSIS — Z808 Family history of malignant neoplasm of other organs or systems: Secondary | ICD-10-CM | POA: Diagnosis not present

## 2016-09-01 DIAGNOSIS — Z8249 Family history of ischemic heart disease and other diseases of the circulatory system: Secondary | ICD-10-CM | POA: Diagnosis not present

## 2016-09-01 DIAGNOSIS — Z9049 Acquired absence of other specified parts of digestive tract: Secondary | ICD-10-CM

## 2016-09-01 DIAGNOSIS — K56609 Unspecified intestinal obstruction, unspecified as to partial versus complete obstruction: Secondary | ICD-10-CM | POA: Diagnosis present

## 2016-09-01 DIAGNOSIS — Z0189 Encounter for other specified special examinations: Secondary | ICD-10-CM

## 2016-09-01 HISTORY — DX: Syncope and collapse: R55

## 2016-09-01 HISTORY — DX: Dizziness and giddiness: R42

## 2016-09-01 LAB — URINALYSIS, ROUTINE W REFLEX MICROSCOPIC
BACTERIA UA: NONE SEEN
BILIRUBIN URINE: NEGATIVE
GLUCOSE, UA: NEGATIVE mg/dL
HGB URINE DIPSTICK: NEGATIVE
Ketones, ur: NEGATIVE mg/dL
Leukocytes, UA: NEGATIVE
Nitrite: NEGATIVE
PROTEIN: 30 mg/dL — AB
SPECIFIC GRAVITY, URINE: 1.01 (ref 1.005–1.030)
pH: 7 (ref 5.0–8.0)

## 2016-09-01 LAB — CBC
HEMATOCRIT: 44.6 % (ref 39.0–52.0)
HEMOGLOBIN: 15.2 g/dL (ref 13.0–17.0)
MCH: 29.3 pg (ref 26.0–34.0)
MCHC: 34.1 g/dL (ref 30.0–36.0)
MCV: 85.9 fL (ref 78.0–100.0)
Platelets: 261 10*3/uL (ref 150–400)
RBC: 5.19 MIL/uL (ref 4.22–5.81)
RDW: 13.5 % (ref 11.5–15.5)
WBC: 12.9 10*3/uL — AB (ref 4.0–10.5)

## 2016-09-01 LAB — COMPREHENSIVE METABOLIC PANEL
ALBUMIN: 4.7 g/dL (ref 3.5–5.0)
ALT: 25 U/L (ref 17–63)
ANION GAP: 10 (ref 5–15)
AST: 24 U/L (ref 15–41)
Alkaline Phosphatase: 78 U/L (ref 38–126)
BUN: 13 mg/dL (ref 6–20)
CO2: 27 mmol/L (ref 22–32)
Calcium: 10.2 mg/dL (ref 8.9–10.3)
Chloride: 98 mmol/L — ABNORMAL LOW (ref 101–111)
Creatinine, Ser: 1.27 mg/dL — ABNORMAL HIGH (ref 0.61–1.24)
GFR, EST NON AFRICAN AMERICAN: 54 mL/min — AB (ref >60)
Glucose, Bld: 138 mg/dL — ABNORMAL HIGH (ref 65–99)
Potassium: 4.6 mmol/L (ref 3.5–5.1)
Sodium: 135 mmol/L (ref 135–145)
TOTAL PROTEIN: 8.1 g/dL (ref 6.5–8.1)
Total Bilirubin: 0.9 mg/dL (ref 0.3–1.2)

## 2016-09-01 LAB — TROPONIN I

## 2016-09-01 LAB — MAGNESIUM: Magnesium: 2.2 mg/dL (ref 1.7–2.4)

## 2016-09-01 LAB — TYPE AND SCREEN
ABO/RH(D): O NEG
Antibody Screen: NEGATIVE

## 2016-09-01 LAB — ABO/RH: ABO/RH(D): O NEG

## 2016-09-01 LAB — LIPASE, BLOOD: Lipase: 18 U/L (ref 11–51)

## 2016-09-01 MED ORDER — SODIUM CHLORIDE 0.9 % IV SOLN
Freq: Once | INTRAVENOUS | Status: AC
  Administered 2016-09-01: 22:00:00 via INTRAVENOUS

## 2016-09-01 MED ORDER — ONDANSETRON HCL 4 MG/2ML IJ SOLN
4.0000 mg | Freq: Four times a day (QID) | INTRAMUSCULAR | Status: DC | PRN
  Administered 2016-09-01 – 2016-09-02 (×4): 4 mg via INTRAVENOUS
  Filled 2016-09-01 (×4): qty 2

## 2016-09-01 MED ORDER — MORPHINE SULFATE (PF) 4 MG/ML IV SOLN
4.0000 mg | Freq: Once | INTRAVENOUS | Status: AC
Start: 2016-09-01 — End: 2016-09-01
  Administered 2016-09-01: 4 mg via INTRAVENOUS
  Filled 2016-09-01: qty 1

## 2016-09-01 MED ORDER — IOPAMIDOL (ISOVUE-300) INJECTION 61%
INTRAVENOUS | Status: AC
  Administered 2016-09-01: 75 mL
  Filled 2016-09-01: qty 100

## 2016-09-01 MED ORDER — ONDANSETRON HCL 4 MG/2ML IJ SOLN
4.0000 mg | Freq: Once | INTRAMUSCULAR | Status: AC
  Administered 2016-09-01: 4 mg via INTRAVENOUS
  Filled 2016-09-01: qty 2

## 2016-09-01 MED ORDER — ACETAMINOPHEN 650 MG RE SUPP: 650.0000 mg | Freq: Four times a day (QID) | RECTAL | Status: DC | PRN

## 2016-09-01 MED ORDER — MORPHINE SULFATE (PF) 4 MG/ML IV SOLN
4.0000 mg | Freq: Once | INTRAVENOUS | Status: AC
  Administered 2016-09-01: 4 mg via INTRAVENOUS
  Filled 2016-09-01: qty 1

## 2016-09-01 MED ORDER — MORPHINE SULFATE (PF) 4 MG/ML IV SOLN
4.0000 mg | INTRAVENOUS | Status: DC | PRN
  Administered 2016-09-01 – 2016-09-02 (×4): 4 mg via INTRAVENOUS
  Filled 2016-09-01 (×4): qty 1

## 2016-09-01 MED ORDER — ONDANSETRON HCL 4 MG PO TABS: 4.0000 mg | ORAL_TABLET | Freq: Four times a day (QID) | ORAL | Status: DC | PRN

## 2016-09-01 MED ORDER — SODIUM CHLORIDE 0.9% FLUSH
3.0000 mL | Freq: Two times a day (BID) | INTRAVENOUS | Status: DC
  Administered 2016-09-01 – 2016-09-04 (×4): 3 mL via INTRAVENOUS

## 2016-09-01 MED ORDER — SODIUM CHLORIDE 0.9 % IV SOLN
Freq: Once | INTRAVENOUS | Status: AC
  Administered 2016-09-01: 19:00:00 via INTRAVENOUS

## 2016-09-01 MED ORDER — ACETAMINOPHEN 325 MG PO TABS: 650.0000 mg | ORAL_TABLET | Freq: Four times a day (QID) | ORAL | Status: DC | PRN

## 2016-09-01 NOTE — ED Notes (Addendum)
Admitting MD at bedside.

## 2016-09-01 NOTE — ED Provider Notes (Signed)
[facility] DEPT Provider Note   CSN: [id number] Arrival date & time: 09/01/16  1519     History   Chief Complaint Chief Complaint  Patient presents with  . Abdominal Pain    HPI Kent [name] is a 76 y.o. male.  The history is provided by the patient.  Abdominal Pain   This is a new problem. The current episode started 6 to 12 hours ago. The problem occurs constantly. The problem has been gradually worsening. The pain is associated with eating. The pain is located in the generalized abdominal region. The quality of the pain is aching. The pain is at a severity of 6/10. The pain is severe. Associated symptoms include anorexia and constipation. The symptoms are aggravated by eating and palpation. Nothing relieves the symptoms. Past workup does not include GI consult. Past medical history comments: Obstruction ho.       Past Medical History:  Diagnosis Date  . 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 Cancer ([place])    basal cell cancer - skin  . Chronic back pain    s/p back surgery  . GERD (gastroesophageal reflux disease)   . GERD (gastroesophageal reflux disease) 08/18/2013  . Incisional hernia   . Sinus infection    currently on antibiotics  . 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 Small bowel obstruction due to adhesions     Patient Active Problem List   Diagnosis Date Noted  . History of colectomy 02/13/2016  . Vomiting 02/13/2016  . Intractable abdominal pain 02/13/2016  . S/P laparoscopic cholecystectomy 10/16/2014  . S/P hernia repair 04/13/2014  . Pre-op evaluation 03/24/2014  . H/O abdominal surgery 08/18/2013  . GERD (gastroesophageal reflux disease) 08/18/2013  . Chronic back pain 08/18/2013  . Sinus infection 08/18/2013  . SBO (small bowel obstruction) 08/12/2013  . Incisional hernia, without obstruction or gangrene 02/02/2013    Past Surgical History:  Procedure Laterality Date  . BACK SURGERY  08/25/2001   Neck 3 disk replacements  . BACK  SURGERY     Placement/removal of nerve stimulator  . CHOLECYSTECTOMY N/A 10/16/2014   Procedure: LAPAROSCOPIC CHOLECYSTECTOMY WITH ATTEMPTED CHOLANGIOGRAM;  Surgeon: Georganna Skeans, MD;  Location: [place];  Service: General;  Laterality: N/A;  . COLON SURGERY  04/2002   Dr. Deon Pilling - Subtotal colectomy, ileostomy  . HERNIA REPAIR    . Ileostomy Takedown  08/2002   Dr. Deon Pilling  . INCISION AND DRAINAGE FOOT    . INCISIONAL HERNIA REPAIR N/A 01/20/2013   Procedure: HERNIA REPAIR INCISIONAL;  Surgeon: Zenovia Jarred, MD;  Location: [facility];  Service: General;  Laterality: N/A;  . [place]  04/13/2014   with mesh    dr Grandville Silos  . INCISIONAL HERNIA REPAIR N/A 04/13/2014   Procedure: OPEN REPAIR INCISIONAL HERNIA ;  Surgeon: Georganna Skeans, MD;  Location: [place];  Service: General;  Laterality: N/A;  . INSERTION OF MESH N/A 04/13/2014   Procedure: INSERTION OF MESH;  Surgeon: Georganna Skeans, MD;  Location: [place];  Service: General;  Laterality: N/A;  . KNEE ARTHROSCOPY Right 2010  . LAPAROSCOPIC CHOLECYSTECTOMY  10/16/2014  . LAPAROTOMY N/A 01/20/2013   Procedure: EXPLORATORY LAPAROTOMY;  Surgeon: Zenovia Jarred, MD;  Location: [facility];  Service: General;  Laterality: N/A;  . Laparotomy with lysis of adhesions  2010   Dr. Grandville Silos - [facility]  . LYSIS OF ADHESION N/A 01/20/2013   Procedure: LYSIS OF ADHESION;  Surgeon:  Zenovia Jarred, MD;  Location: [place] [facility];  Service: General;  Laterality: N/A;  . microsurgical  03/24/2002   Lateral recess decompression L5  . SACRAL NERVE STIMULATOR PLACEMENT     inserted and removed  . SHOULDER ARTHROSCOPY Bilateral   . VASECTOMY         Home Medications    Prior to Admission medications   Medication Sig Start Date End Date Taking? Authorizing Provider  lidocaine (LIDODERM) 5 % Place 1 patch onto the skin daily as needed (for pain). Remove & Discard patch within 12 hours or as directed by MD    Historical Provider, MD  methocarbamol (ROBAXIN) 500  MG tablet Take 1,000 mg by mouth 2 (two) times daily.     Historical Provider, MD  omeprazole (PRILOSEC) 20 MG capsule Take 20 mg by mouth daily. 02/03/16   Historical Provider, MD  oxyCODONE (OXYCONTIN) 20 MG 12 hr tablet Take 20 mg by mouth every 12 (twelve) hours.    Historical Provider, MD  polyethylene glycol (MIRALAX / GLYCOLAX) packet Take 17 g by mouth daily as needed for mild constipation. 02/15/16   Domenic Polite, MD  tamsulosin (FLOMAX) 0.4 MG CAPS capsule Take 1 capsule (0.4 mg total) by mouth daily. Patient not taking: Reported on 10/04/2014 04/19/14   Georganna Skeans, MD    Family History Family History  Problem Relation Age of Onset  . Heart disease Mother   . Brain cancer Father     Social History Social History  Substance Use Topics  . Smoking status: Former Smoker    Quit date: 10/09/1979  . Smokeless tobacco: Former Systems developer    Types: Chew  . Alcohol use No     Allergies   Patient has no known allergies.   Review of Systems Review of Systems  Constitutional: Negative for activity change.  Respiratory: Negative for shortness of breath.   Cardiovascular: Negative for chest pain.  Gastrointestinal: Positive for abdominal pain, anorexia and constipation.  All other systems reviewed and are negative.    Physical Exam Updated Vital Signs BP (!) 131/108 (BP Location: Left Arm)   Pulse 110   Temp 97.2 F (36.2 C) (Oral)   Resp 20   Ht 5\' 11"  (1.803 m)   Wt 162 lb (73.5 kg)   SpO2 100%   BMI 22.59 kg/m   Physical Exam  Constitutional: He is oriented to person, place, and time. He appears well-nourished.  HENT:  Head: Normocephalic.  Eyes: Conjunctivae are normal.  Cardiovascular: Normal rate and regular rhythm.   No murmur heard. Pulmonary/Chest: Effort normal and breath sounds normal. No respiratory distress. He has no wheezes.  Abdominal:  Diffusely painfuil  Neurological: He is oriented to person, place, and time.  Skin: Skin is warm and dry. He is  not diaphoretic.  Psychiatric: He has a normal mood and affect. His behavior is normal.     ED Treatments / Results  Labs (all labs ordered are listed, but only abnormal results are displayed) Labs Reviewed  COMPREHENSIVE METABOLIC PANEL - Abnormal; Notable for the following:       Result Value   Chloride 98 (*)    Glucose, Bld 138 (*)    Creatinine, Ser 1.27 (*)    GFR calc non Af Amer 54 (*)    All other components within normal limits  CBC - Abnormal; Notable for the following:    WBC 12.9 (*)    All other components within normal limits  LIPASE, BLOOD  URINALYSIS,  ROUTINE W REFLEX MICROSCOPIC  TYPE AND SCREEN    EKG  EKG Interpretation None       Radiology No results found.  Procedures Procedures (including critical care time)  Medications Ordered in ED Medications  morphine 4 MG/ML injection 4 mg (not administered)  ondansetron (ZOFRAN) injection 4 mg (not administered)     Initial Impression / Assessment and Plan / ED Course  I have reviewed the triage vital signs and the nursing notes.  Pertinent labs & imaging results that were available during my care of the patient were reviewed by me and considered in my medical decision making (see chart for details).     Patient is a pleasant 76 year old male with past mental history significant for multiple small small bowel obstructions, bowel resection ostomy and the reversal. Patient here with symptoms similar to prior times. Patient unable to take by mouth. Patient in pain, pain to palpation on exam. We'll get labs, CT.  6:31 PM Patient reassessed. CT shows partial small bowel traction. Patient still having continued pain and unable to tolerate by mouth. We will discuss with surgery.  Discussed with Dr. Grandville Silos. Because its  partial and likely not be surgical will admit to medicine and surgery will follow.    Final Clinical Impressions(s) / ED Diagnoses   Final diagnoses:  None    New  Prescriptions New Prescriptions   No medications on file     Abagael Kramm Julio Alm, MD 09/01/16 2255

## 2016-09-01 NOTE — Progress Notes (Signed)
MD was paged to know if it was okay to keep pt off IV fluid while he's NPO. Also, MD was asked if there was a need to check CBG sometimes since pt was NPO. MD transferred the order for fluid infusion. MD put in an order to check CBG if pt was assessed to be symptomatic. Will continue to monitor.

## 2016-09-01 NOTE — ED Triage Notes (Signed)
The pt is c/o abd pain with nausea for 2 days   Hx of bowel obstructions

## 2016-09-01 NOTE — Consult Note (Signed)
Reason for Consult: Partial small bowel obstruction Referring Physician: Dalene Seltzer [name] is an 76 y.o. male.  HPI: [name] is well known to our acute care surgery service status post lysis of adhesions in the past. Subsequently he underwent incisional hernia repair and laparoscopic cholecystectomy by myself. He has also had a subtotal colectomy in the past. He developed central abdominal pain last night after eating dinner. He had associated nausea but no vomiting. The pain persisted until this morning. It seemed to get worse today and he came to the emergency department. He underwent CT scan of the abdomen and pelvis which shows partial small bowel obstruction. Since arrival to the emergency department, he has had one episode of diarrhea.  Past Medical History:  Diagnosis Date  . 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 Cancer ([place])    basal cell cancer - skin  . Chronic back pain    s/p back surgery  . GERD (gastroesophageal reflux disease)   . GERD (gastroesophageal reflux disease) 08/18/2013  . Incisional hernia   . Sinus infection    currently on antibiotics  . 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 Small bowel obstruction due to adhesions     Past Surgical History:  Procedure Laterality Date  . BACK SURGERY  08/25/2001   Neck 3 disk replacements  . BACK SURGERY     Placement/removal of nerve stimulator  . CHOLECYSTECTOMY N/A 10/16/2014   Procedure: LAPAROSCOPIC CHOLECYSTECTOMY WITH ATTEMPTED CHOLANGIOGRAM;  Surgeon: Georganna Skeans, MD;  Location: [facility];  Service: General;  Laterality: N/A;  . COLON SURGERY  04/2002   Dr. Deon Pilling - Subtotal colectomy, ileostomy  . HERNIA REPAIR    . Ileostomy Takedown  08/2002   Dr. Deon Pilling  . INCISION AND DRAINAGE FOOT    . INCISIONAL HERNIA REPAIR N/A 01/20/2013   Procedure: HERNIA REPAIR INCISIONAL;  Surgeon: Zenovia Jarred, MD;  Location: [facility];  Service: General;  Laterality: N/A;  . [place]  04/13/2014    with mesh    dr Grandville Silos  . INCISIONAL HERNIA REPAIR N/A 04/13/2014   Procedure: OPEN REPAIR INCISIONAL HERNIA ;  Surgeon: Georganna Skeans, MD;  Location: [place];  Service: General;  Laterality: N/A;  . INSERTION OF MESH N/A 04/13/2014   Procedure: INSERTION OF MESH;  Surgeon: Georganna Skeans, MD;  Location: [facility];  Service: General;  Laterality: N/A;  . KNEE ARTHROSCOPY Right 2010  . LAPAROSCOPIC CHOLECYSTECTOMY  10/16/2014  . LAPAROTOMY N/A 01/20/2013   Procedure: EXPLORATORY LAPAROTOMY;  Surgeon: Zenovia Jarred, MD;  Location: [name];  Service: General;  Laterality: N/A;  . Laparotomy with lysis of adhesions  2010   Dr. Grandville Silos - [facility]  . LYSIS OF ADHESION N/A 01/20/2013   Procedure: LYSIS OF ADHESION;  Surgeon: Zenovia Jarred, MD;  Location: [place];  Service: General;  Laterality: N/A;  . microsurgical  03/24/2002   Lateral recess decompression L5  . SACRAL NERVE STIMULATOR PLACEMENT     inserted and removed  . SHOULDER ARTHROSCOPY Bilateral   . VASECTOMY      Family History  Problem Relation Age of Onset  . Heart disease Mother   . Brain cancer Father     Social History:  reports that he quit smoking about 36 years ago. He has quit using smokeless tobacco. His smokeless tobacco use included Chew. He reports that he does not drink alcohol or use drugs.  Allergies:  No Known Allergies  Medications: Prior to Admission:  (Not in a hospital admission)  Results for orders placed or performed during the hospital encounter of 09/01/16 (from the past 48 hour(s))  Lipase, blood     Status: None   Collection Time: 09/01/16  3:27 PM  Result Value Ref Range   Lipase 18 11 - 51 U/L  Comprehensive metabolic panel     Status: Abnormal   Collection Time: 09/01/16  3:27 PM  Result Value Ref Range   Sodium 135 135 - 145 mmol/L   Potassium 4.6 3.5 - 5.1 mmol/L   Chloride 98 (L) 101 - 111 mmol/L   CO2 27 22 - 32 mmol/L   Glucose, Bld 138 (H) 65 - 99 mg/dL   BUN 13 6 - 20 mg/dL   Creatinine,  Ser 1.27 (H) 0.61 - 1.24 mg/dL   Calcium 10.2 8.9 - 10.3 mg/dL   Total Protein 8.1 6.5 - 8.1 g/dL   Albumin 4.7 3.5 - 5.0 g/dL   AST 24 15 - 41 U/L   ALT 25 17 - 63 U/L   Alkaline Phosphatase 78 38 - 126 U/L   Total Bilirubin 0.9 0.3 - 1.2 mg/dL   GFR calc non Af Amer 54 (L) >60 mL/min   GFR calc Af Amer >60 >60 mL/min    Comment: (NOTE) The eGFR has been calculated using the CKD EPI equation. This calculation has not been validated in all clinical situations. eGFR's persistently <60 mL/min signify possible Chronic Kidney Disease.    Anion gap 10 5 - 15  CBC     Status: Abnormal   Collection Time: 09/01/16  3:27 PM  Result Value Ref Range   WBC 12.9 (H) 4.0 - 10.5 K/uL   RBC 5.19 4.22 - 5.81 MIL/uL   Hemoglobin 15.2 13.0 - 17.0 g/dL   HCT 44.6 39.0 - 52.0 %   MCV 85.9 78.0 - 100.0 fL   MCH 29.3 26.0 - 34.0 pg   MCHC 34.1 30.0 - 36.0 g/dL   RDW 13.5 11.5 - 15.5 %   Platelets 261 150 - 400 K/uL  Type and screen [place]     Status: None   Collection Time: 09/01/16  4:33 PM  Result Value Ref Range   ABO/RH(D) O NEG    Antibody Screen NEG    Sample Expiration 09/04/2016   Urinalysis, Routine w reflex microscopic     Status: Abnormal   Collection Time: 09/01/16  5:00 PM  Result Value Ref Range   Color, Urine YELLOW YELLOW   APPearance HAZY (A) CLEAR   Specific Gravity, Urine 1.010 1.005 - 1.030   pH 7.0 5.0 - 8.0   Glucose, UA NEGATIVE NEGATIVE mg/dL   Hgb urine dipstick NEGATIVE NEGATIVE   Bilirubin Urine NEGATIVE NEGATIVE   Ketones, ur NEGATIVE NEGATIVE mg/dL   Protein, ur 30 (A) NEGATIVE mg/dL   Nitrite NEGATIVE NEGATIVE   Leukocytes, UA NEGATIVE NEGATIVE   RBC / HPF 0-5 0 - 5 RBC/hpf   WBC, UA 0-5 0 - 5 WBC/hpf   Bacteria, UA NONE SEEN NONE SEEN   Squamous Epithelial / LPF 0-5 (A) NONE SEEN   Hyaline Casts, UA PRESENT   ABO/Rh     Status: None   Collection Time: 09/01/16  5:00 PM  Result Value Ref Range   ABO/RH(D) O NEG     Ct Abdomen  Pelvis W Contrast  Result Date: 09/01/2016 CLINICAL DATA:  76 year old male with history of diffuse  abdominal pain for 1 day. Diarrhea and nausea. Evaluate for potential bowel obstruction. EXAM: CT ABDOMEN AND PELVIS WITH CONTRAST TECHNIQUE: Multidetector CT imaging of the abdomen and pelvis was performed using the standard protocol following bolus administration of intravenous contrast. CONTRAST:  33m ISOVUE-300 IOPAMIDOL (ISOVUE-300) INJECTION 61% COMPARISON:  CT the abdomen and pelvis 02/13/2016. FINDINGS: Lower chest: 2 mm left lower lobe nodule (image 6 of series 205) unchanged over numerous prior examinations dating back to at least 09/01/2014, considered benign. Hepatobiliary: No cystic or solid hepatic lesions. Status post cholecystectomy. Minimal intrahepatic biliary ductal dilatation (unchanged) and mild dilatation of the common bile duct (10 mm in diameter, also unchanged), likely reflective of benign post cholecystectomy no pancreatic mass. No pancreatic ductal dilatation. No pancreatic or peripancreatic fluid or inflammatory changes. Pancreas: Unremarkable. Spleen: Unremarkable. Adrenals/Urinary Tract: Bilateral adrenal glands and bilateral kidneys are normal in appearance. No hydroureteronephrosis. Urinary bladder is normal in appearance. Stomach/Bowel: The appearance of the stomach is normal. Status post subtotal colectomy. In the left side of the abdomen there is a focally dilated loop of small bowel that contains a large amount of fluid measuring up to 4.6 cm in diameter (image 32 of series 201), which appears to be jejunal. This abruptly transitions to normal caliber bowel in an area where there is some mild angulation, which could suggest the presence of some adhesions. No other definite pathologic dilatation of small bowel is noted. Several loops of distal small bowel appear mildly thickened, particularly in the right side of the abdomen. Several loops in this region also appear clustered,  suggesting the presence of adhesions. Large amount of liquid stool throughout the remaining sigmoid colon and rectum compatible with the reported clinical history of diarrhea. Vascular/Lymphatic: Aortic atherosclerosis, without evidence of aneurysm or dissection in the abdominal or pelvic vasculature. No lymphadenopathy noted in the abdomen or pelvis. Reproductive: Prostate gland and seminal vesicles are unremarkable in appearance. Other: No significant volume of ascites.  No pneumoperitoneum. Musculoskeletal: There are no aggressive appearing lytic or blastic lesions noted in the visualized portions of the skeleton. IMPRESSION: 1. There is multifocal clustering of small bowel loops in the abdomen and pelvis, suggesting the presence of underlying adhesions. In the left upper quadrant there is a loop of jejunum which appears mildly dilated and fluid-filled, which could suggest very mild partial obstruction. However, there is a relatively large volume of distal colorectal liquid stool. 2. Status post subtotal colectomy. 3. Aortic atherosclerosis. 4. Additional incidental findings, as above. Electronically Signed   By: DVinnie LangtonM.D.   On: 09/01/2016 18:08    Review of Systems  Constitutional: Negative for chills and fever.  HENT: Negative.   Eyes: Negative for blurred vision.  Respiratory: Negative for cough and shortness of breath.   Cardiovascular: Negative for chest pain.  Gastrointestinal: Positive for abdominal pain, diarrhea and nausea. Negative for vomiting.  Genitourinary: Negative.   Musculoskeletal: Positive for back pain.  Skin: Negative.   Neurological: Negative.   Endo/Heme/Allergies: Negative.   Psychiatric/Behavioral: Negative.    Blood pressure (!) 131/108, pulse 110, temperature 97.2 F (36.2 C), temperature source Oral, resp. rate 20, height _0  (1.803 m), weight 73.5 kg (162 lb), SpO2 100 %. Physical Exam  Constitutional: He is oriented to person, place, and time. He  appears well-developed and well-nourished. No distress.  HENT:  Head: Normocephalic.  Right Ear: External ear normal.  Left Ear: External ear normal.  Nose: Nose normal.  Mouth/Throat: Oropharynx is clear and moist.  Eyes:  EOM are normal.  Neck: Neck supple. No tracheal deviation present.  Cardiovascular: Normal rate and normal heart sounds.   Respiratory: Effort normal and breath sounds normal. No respiratory distress. He has no wheezes.  GI: Soft. He exhibits distension. There is tenderness. There is no rebound and no guarding.  Moderate distention, some central abdominal tenderness but no peritonitis  Musculoskeletal: Normal range of motion.  Neurological: He is alert and oriented to person, place, and time.  Skin: Skin is warm.  Psychiatric: He has a normal mood and affect.    Assessment/Plan: Partial small bowel obstruction - possibly due to adhesions. He has had one episode of diarrhea in the emergency department so it may be improving already. No need for emergent surgery. Would hold off on NGT if no vomiting. Agree with medical admission, bowel rest, IV fluids. Would repeat abdominal films tomorrow and we will follow with you.  Keionte Swicegood E 09/01/2016, 6:52 PM

## 2016-09-01 NOTE — Progress Notes (Signed)
Pt arrived floor. Vital signs completed. Cardiac telemetry was initiated.MD was notified to have orders verified. Will continue to monitor.

## 2016-09-01 NOTE — H&P (Signed)
Kent [name] UJW:[id number] DOB: [birth year] DOA: 09/01/2016     PCP: Johny Blamer, MD   Outpatient Specialists: Surgery Janee Morn Patient coming from:    home Lives   With family    Chief Complaint: Abdominal pain and distention  HPI: Kent [name] is a 76 y.o. male with medical history significant of GERD recurrent small bowel obstruction, sp subtotal colectomy. Chronic back pain    Presented with abdominal pain and had distention similar to prior episodes of small bowel obstruction associated nausea but no vomiting symptoms are  worse with eating. He has been burping. Has a large loose BM in ER. Today have been very light headed no syncope no chest pain. Felt presyncope if tried to stand up. Have had abdominal pain similar to prior SBO.    Regarding pertinent Chronic problems: Last admission for small bowel obstruction was in August 2017 to his with conservative management and improved with bowel rest Has Chronic back pain take oxycontin  IN ER:  Temp (24hrs), Avg:97.2 F (36.2 C), Min:97.2 F (36.2 C), Max:97.2 F (36.2 C)    RR 15 98% Hr 89 SBP 93/61 Na 135 K 4.6 cR 1.27 ALB 4.7 Wbc 12.9 hG 15.2   Ct ABD: ADHESIONS, mild partial SBO, large fluid filled colon Following Medications were ordered in ER: Medications  morphine 4 MG/ML injection 4 mg (4 mg Intravenous Given 09/01/16 1712)  ondansetron (ZOFRAN) injection 4 mg (4 mg Intravenous Given 09/01/16 1712)  iopamidol (ISOVUE-300) 61 % injection (75 mLs  Contrast Given 09/01/16 1733)  morphine 4 MG/ML injection 4 mg (4 mg Intravenous Given 09/01/16 1909)  0.9 %  sodium chloride infusion ( Intravenous New Bag/Given 09/01/16 1909)     ER provider discussed case with:  Surgery who will see patient in consult  Hospitalist was called for admission for Partial small bowel obstruction  Review of Systems:    Pertinent positives include: abdominal pain, nausea, vomiting,  Constitutional:  No weight loss, night sweats,  Fevers, chills, fatigue, weight loss  HEENT:  No headaches, Difficulty swallowing,Tooth/dental problems,Sore throat,  No sneezing, itching, ear ache, nasal congestion, post nasal drip,  Cardio-vascular:  No chest pain, Orthopnea, PND, anasarca, dizziness, palpitations.no Bilateral lower extremity swelling  GI:  No heartburn, indigestion,  diarrhea, change in bowel habits, loss of appetite, melena, blood in stool, hematemesis Resp:  no shortness of breath at rest. No dyspnea on exertion, No excess mucus, no productive cough, No non-productive cough, No coughing up of blood.No change in color of mucus.No wheezing. Skin:  no rash or lesions. No jaundice GU:  no dysuria, change in color of urine, no urgency or frequency. No straining to urinate.  No flank pain.  Musculoskeletal:  No joint pain or no joint swelling. No decreased range of motion. No back pain.  Psych:  No change in mood or affect. No depression or anxiety. No memory loss.  Neuro: no localizing neurological complaints, no tingling, no weakness, no double vision, no gait abnormality, no slurred speech, no confusion  As per HPI otherwise 10 point review of systems negative.   Past Medical History: Past Medical History:  Diagnosis Date  . 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 Cancer (HCC)    basal cell cancer - skin  . Chronic back pain    s/p back surgery  . GERD (gastroesophageal reflux disease)   . GERD (gastroesophageal reflux disease) 08/18/2013  . Incisional hernia   . Sinus  infection    currently on antibiotics  . 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 BACK SURGERY  08/25/2001   Neck 3 disk replacements  . BACK SURGERY     Placement/removal of nerve stimulator  . CHOLECYSTECTOMY N/A 10/16/2014   Procedure: LAPAROSCOPIC CHOLECYSTECTOMY WITH ATTEMPTED CHOLANGIOGRAM;  Surgeon: Violeta Gelinas, MD;  Location: [facility] OR;   Service: General;  Laterality: N/A;  . COLON SURGERY  04/2002   Dr. Orson Slick - Subtotal colectomy, ileostomy  . HERNIA REPAIR    . Ileostomy Takedown  08/2002   Dr. Orson Slick  . INCISION AND DRAINAGE FOOT    . INCISIONAL HERNIA REPAIR N/A 01/20/2013   Procedure: HERNIA REPAIR INCISIONAL;  Surgeon: Liz Malady, MD;  Location: [facility] OR;  Service: General;  Laterality: N/A;  . INCISIONAL HERNIA REPAIR  04/13/2014   with mesh    dr Janee Morn  . INCISIONAL HERNIA REPAIR N/A 04/13/2014   Procedure: OPEN REPAIR INCISIONAL HERNIA ;  Surgeon: Violeta Gelinas, MD;  Location: [facility] OR;  Service: General;  Laterality: N/A;  . INSERTION OF MESH N/A 04/13/2014   Procedure: INSERTION OF MESH;  Surgeon: Violeta Gelinas, MD;  Location: [facility] OR;  Service: General;  Laterality: N/A;  . KNEE ARTHROSCOPY Right 2010  . LAPAROSCOPIC CHOLECYSTECTOMY  10/16/2014  . LAPAROTOMY N/A 01/20/2013   Procedure: EXPLORATORY LAPAROTOMY;  Surgeon: Liz Malady, MD;  Location: [facility] OR;  Service: General;  Laterality: N/A;  . Laparotomy with lysis of adhesions  2010   Dr. Janee Morn - SBO  . LYSIS OF ADHESION N/A 01/20/2013   Procedure: LYSIS OF ADHESION;  Surgeon: Liz Malady, MD;  Location: [facility] OR;  Service: General;  Laterality: N/A;  . microsurgical  03/24/2002   Lateral recess decompression L5  . SACRAL NERVE STIMULATOR PLACEMENT     inserted and removed  . SHOULDER ARTHROSCOPY Bilateral   . VASECTOMY       Social History:  Ambulatory   independently      reports that he quit smoking about 36 years ago. He has quit using smokeless tobacco. His smokeless tobacco use included Chew. He reports that he does not drink alcohol or use drugs.  Allergies:  No Known Allergies     Family History:   Family History  Problem Relation Age of Onset  . Heart disease Mother   . Brain cancer Father     Medications: Prior to Admission medications   Medication Sig Start Date End Date Taking? Authorizing Provider  lidocaine  (LIDODERM) 5 % Place 1 patch onto the skin daily as needed (for pain). Remove & Discard patch within 12 hours or as directed by MD    Historical Provider, MD  methocarbamol (ROBAXIN) 500 MG tablet Take 1,000 mg by mouth 2 (two) times daily.     Historical Provider, MD  omeprazole (PRILOSEC) 20 MG capsule Take 20 mg by mouth daily. 02/03/16   Historical Provider, MD  oxyCODONE (OXYCONTIN) 20 MG 12 hr tablet Take 20 mg by mouth every 12 (twelve) hours.    Historical Provider, MD  polyethylene glycol (MIRALAX / GLYCOLAX) packet Take 17 g by mouth daily as needed for mild constipation. 02/15/16   Zannie Cove, MD  tamsulosin (FLOMAX) 0.4 MG CAPS capsule Take 1 capsule (0.4 mg total) by mouth daily. Patient not taking: Reported on 10/04/2014 04/19/14   Violeta Gelinas, MD    Physical  Exam: Patient Vitals for the past 24 hrs:  BP Temp Temp src Pulse Resp SpO2 Height Weight  09/01/16 1900 93/61 - - 89 15 98 % - -  09/01/16 1845 (!) 103/54 - - 93 20 97 % - -  09/01/16 1529 (!) 131/108 97.2 F (36.2 C) Oral 110 20 100 % - -  09/01/16 1525 - - - - - - 5\' 11"  (1.803 m) 73.5 kg (162 lb)    1. General:  in No Acute distress 2. Psychological: Alert and   Oriented 3. Head/ENT:     Dry Mucous Membranes                          Head Non traumatic, neck supple                            Poor Dentition 4. SKIN:   decreased Skin turgor,  Skin clean Dry and intact no rash 5. Heart: Regular rate and rhythm no  Murmur, Rub or gallop 6. Lungs:   no wheezes or crackles   7. Abdomen: Soft, right  lower quadrant tender,   distended 8. Lower extremities: no clubbing, cyanosis, or edema 9. Neurologically Grossly intact, moving all 4 extremities equally  10. MSK: Normal range of motion   body mass index is 22.59 kg/m.  Labs on Admission:   Labs on Admission: I have personally reviewed following labs and imaging studies  CBC:  Recent Labs Lab 09/01/16 1527  WBC 12.9*  HGB 15.2  HCT 44.6  MCV 85.9    PLT 261   Basic Metabolic Panel:  Recent Labs Lab 09/01/16 1527  NA 135  K 4.6  CL 98*  CO2 27  GLUCOSE 138*  BUN 13  CREATININE 1.27*  CALCIUM 10.2   GFR: Estimated Creatinine Clearance: 53.9 mL/min (by C-G formula based on SCr of 1.27 mg/dL (H)). Liver Function Tests:  Recent Labs Lab 09/01/16 1527  AST 24  ALT 25  ALKPHOS 78  BILITOT 0.9  PROT 8.1  ALBUMIN 4.7    Recent Labs Lab 09/01/16 1527  LIPASE 18   No results for input(s): AMMONIA in the last 168 hours. Coagulation Profile: No results for input(s): INR, PROTIME in the last 168 hours. Cardiac Enzymes: No results for input(s): CKTOTAL, CKMB, CKMBINDEX, TROPONINI in the last 168 hours. BNP (last 3 results) No results for input(s): PROBNP in the last 8760 hours. HbA1C: No results for input(s): HGBA1C in the last 72 hours. CBG: No results for input(s): GLUCAP in the last 168 hours. Lipid Profile: No results for input(s): CHOL, HDL, LDLCALC, TRIG, CHOLHDL, LDLDIRECT in the last 72 hours. Thyroid Function Tests: No results for input(s): TSH, T4TOTAL, FREET4, T3FREE, THYROIDAB in the last 72 hours. Anemia Panel: No results for input(s): VITAMINB12, FOLATE, FERRITIN, TIBC, IRON, RETICCTPCT in the last 72 hours. Urine analysis:    Component Value Date/Time   COLORURINE YELLOW 09/01/2016 1700   APPEARANCEUR HAZY (A) 09/01/2016 1700   LABSPEC 1.010 09/01/2016 1700   PHURINE 7.0 09/01/2016 1700   GLUCOSEU NEGATIVE 09/01/2016 1700   HGBUR NEGATIVE 09/01/2016 1700   BILIRUBINUR NEGATIVE 09/01/2016 1700   KETONESUR NEGATIVE 09/01/2016 1700   PROTEINUR 30 (A) 09/01/2016 1700   UROBILINOGEN 0.2 10/04/2014 2223   NITRITE NEGATIVE 09/01/2016 1700   LEUKOCYTESUR NEGATIVE 09/01/2016 1700   Sepsis Labs: @LABRCNTIP (procalcitonin:4,lacticidven:4) )No results found for this or any previous visit (from the  past 240 hour(s)).    UA   no evidence of UTI     No results found for: HGBA1C  Estimated Creatinine  Clearance: 53.9 mL/min (by C-G formula based on SCr of 1.27 mg/dL (H)).  BNP (last 3 results) No results for input(s): PROBNP in the last 8760 hours.   ECG REPORT  Independently reviewed Rate: 98  Rhythm: NSR ST&T Change:ST segment minimal elevation similar to baseline QTC 441  Filed Weights   09/01/16 1525  Weight: 73.5 kg (162 lb)     Cultures:    Component Value Date/Time   SDES URINE, CLEAN CATCH 08/12/2013 1103   SPECREQUEST NONE 08/12/2013 1103   CULT NO GROWTH Performed at [facility] 08/12/2013 1103   REPTSTATUS 08/13/2013 FINAL 08/12/2013 1103     Radiological Exams on Admission: Ct Abdomen Pelvis W Contrast  Result Date: 09/01/2016 CLINICAL DATA:  76 year old male with history of diffuse abdominal pain for 1 day. Diarrhea and nausea. Evaluate for potential bowel obstruction. EXAM: CT ABDOMEN AND PELVIS WITH CONTRAST TECHNIQUE: Multidetector CT imaging of the abdomen and pelvis was performed using the standard protocol following bolus administration of intravenous contrast. CONTRAST:  75mL ISOVUE-300 IOPAMIDOL (ISOVUE-300) INJECTION 61% COMPARISON:  CT the abdomen and pelvis 02/13/2016. FINDINGS: Lower chest: 2 mm left lower lobe nodule (image 6 of series 205) unchanged over numerous prior examinations dating back to at least 09/01/2014, considered benign. Hepatobiliary: No cystic or solid hepatic lesions. Status post cholecystectomy. Minimal intrahepatic biliary ductal dilatation (unchanged) and mild dilatation of the common bile duct (10 mm in diameter, also unchanged), likely reflective of benign post cholecystectomy no pancreatic mass. No pancreatic ductal dilatation. No pancreatic or peripancreatic fluid or inflammatory changes. Pancreas: Unremarkable. Spleen: Unremarkable. Adrenals/Urinary Tract: Bilateral adrenal glands and bilateral kidneys are normal in appearance. No hydroureteronephrosis. Urinary bladder is normal in appearance. Stomach/Bowel: The  appearance of the stomach is normal. Status post subtotal colectomy. In the left side of the abdomen there is a focally dilated loop of small bowel that contains a large amount of fluid measuring up to 4.6 cm in diameter (image 32 of series 201), which appears to be jejunal. This abruptly transitions to normal caliber bowel in an area where there is some mild angulation, which could suggest the presence of some adhesions. No other definite pathologic dilatation of small bowel is noted. Several loops of distal small bowel appear mildly thickened, particularly in the right side of the abdomen. Several loops in this region also appear clustered, suggesting the presence of adhesions. Large amount of liquid stool throughout the remaining sigmoid colon and rectum compatible with the reported clinical history of diarrhea. Vascular/Lymphatic: Aortic atherosclerosis, without evidence of aneurysm or dissection in the abdominal or pelvic vasculature. No lymphadenopathy noted in the abdomen or pelvis. Reproductive: Prostate gland and seminal vesicles are unremarkable in appearance. Other: No significant volume of ascites.  No pneumoperitoneum. Musculoskeletal: There are no aggressive appearing lytic or blastic lesions noted in the visualized portions of the skeleton. IMPRESSION: 1. There is multifocal clustering of small bowel loops in the abdomen and pelvis, suggesting the presence of underlying adhesions. In the left upper quadrant there is a loop of jejunum which appears mildly dilated and fluid-filled, which could suggest very mild partial obstruction. However, there is a relatively large volume of distal colorectal liquid stool. 2. Status post subtotal colectomy. 3. Aortic atherosclerosis. 4. Additional incidental findings, as above. Electronically Signed   By: Trudie Reed M.D.   On: 09/01/2016 18:08  Chart has been reviewed    Assessment/Plan  76 y.o. male with medical history significant of GERD recurrent  small bowel obstruction, sp subtotal colectomy. Chronic back pain admitted for recurrent small bowel obstruction dehydration presyncope abnormal EKG Present on Admission:  . SBO (small bowel obstruction) recurrent surgery aware. Keep nothing by mouth.  KUB in a.m. Patient at this point not interested in NG tube placement but if has recurrent nausea or vomiting with highly recommended may need radiology placement of NG. . Dehydration admit administer IV fluids . Leukocytosis likely stress to margination but will monitor for any source of infection . Nonspecific abnormal electrocardiogram (ECG) (EKG) monitor on telemetry given presyncope and advanced age and possibility of needing surgical interventions will obtain echogram . GERD (gastroesophageal reflux disease) stable hold by mouth medications for tonight  Other plan as per orders.  DVT prophylaxis:  SCD      Code Status:  FULL CODE as per patient    Family Communication:   Family  at  Bedside  plan of care was discussed with   Wife,    Disposition Plan:    To home once workup is complete and patient is stable                              Consults called: General surgery  Admission status:  inpatient      Level of care    tele        I have spent a total of 57 min on this admission  Kent [name] 09/01/2016, 9:57 PM    [facility]  Pager [phone]   after 2 AM please page floor coverage PA If 7AM-7PM, please contact the day team taking care of the patient  [email or web address]  Password [email or web address]

## 2016-09-02 ENCOUNTER — Inpatient Hospital Stay (HOSPITAL_COMMUNITY): Payer: Medicare Other

## 2016-09-02 DIAGNOSIS — R9431 Abnormal electrocardiogram [ECG] [EKG]: Secondary | ICD-10-CM

## 2016-09-02 LAB — COMPREHENSIVE METABOLIC PANEL
ALBUMIN: 3.6 g/dL (ref 3.5–5.0)
ALK PHOS: 106 U/L (ref 38–126)
ALT: 140 U/L — AB (ref 17–63)
ANION GAP: 5 (ref 5–15)
AST: 178 U/L — ABNORMAL HIGH (ref 15–41)
BUN: 15 mg/dL (ref 6–20)
CALCIUM: 9.1 mg/dL (ref 8.9–10.3)
CO2: 27 mmol/L (ref 22–32)
CREATININE: 1.24 mg/dL (ref 0.61–1.24)
Chloride: 106 mmol/L (ref 101–111)
GFR calc Af Amer: >60 mL/min (ref >60)
GFR calc non Af Amer: 56 mL/min — ABNORMAL LOW (ref >60)
GLUCOSE: 104 mg/dL — AB (ref 65–99)
Potassium: 4.7 mmol/L (ref 3.5–5.1)
SODIUM: 138 mmol/L (ref 135–145)
Total Bilirubin: 1.1 mg/dL (ref 0.3–1.2)
Total Protein: 6.3 g/dL — ABNORMAL LOW (ref 6.5–8.1)

## 2016-09-02 LAB — ECHOCARDIOGRAM COMPLETE
Height: 71 in
WEIGHTICAEL: 2592 [oz_av]

## 2016-09-02 LAB — PHOSPHORUS: Phosphorus: 3.2 mg/dL (ref 2.5–4.6)

## 2016-09-02 LAB — CBC
HCT: 36.9 % — ABNORMAL LOW (ref 39.0–52.0)
HEMOGLOBIN: 12.3 g/dL — AB (ref 13.0–17.0)
MCH: 28.9 pg (ref 26.0–34.0)
MCHC: 33.3 g/dL (ref 30.0–36.0)
MCV: 86.6 fL (ref 78.0–100.0)
Platelets: 167 10*3/uL (ref 150–400)
RBC: 4.26 MIL/uL (ref 4.22–5.81)
RDW: 13.8 % (ref 11.5–15.5)
WBC: 4.2 10*3/uL (ref 4.0–10.5)

## 2016-09-02 LAB — TROPONIN I: Troponin I: <0.03 ng/mL (ref <0.03)

## 2016-09-02 LAB — MAGNESIUM: Magnesium: 2.1 mg/dL (ref 1.7–2.4)

## 2016-09-02 LAB — TSH: TSH: 1.096 u[IU]/mL (ref 0.350–4.500)

## 2016-09-02 MED ORDER — PHENOL 1.4 % MT LIQD
1.0000 | OROMUCOSAL | Status: DC | PRN
  Administered 2016-09-02: 1 via OROMUCOSAL
  Filled 2016-09-02: qty 177

## 2016-09-02 MED ORDER — LIDOCAINE VISCOUS 2 % MT SOLN
OROMUCOSAL | Status: AC
  Administered 2016-09-02: 5 mL via OROMUCOSAL
  Filled 2016-09-02: qty 15

## 2016-09-02 MED ORDER — HEPARIN SODIUM (PORCINE) 5000 UNIT/ML IJ SOLN
5000.0000 [IU] | Freq: Three times a day (TID) | INTRAMUSCULAR | Status: DC
  Administered 2016-09-02 – 2016-09-04 (×7): 5000 [IU] via SUBCUTANEOUS
  Filled 2016-09-02 (×7): qty 1

## 2016-09-02 MED ORDER — DIATRIZOATE MEGLUMINE & SODIUM 66-10 % PO SOLN
90.0000 mL | Freq: Once | ORAL | Status: AC
  Administered 2016-09-02: 90 mL via NASOGASTRIC
  Filled 2016-09-02: qty 90

## 2016-09-02 MED ORDER — LIDOCAINE VISCOUS 2 % MT SOLN
15.0000 mL | Freq: Once | OROMUCOSAL | Status: AC
  Administered 2016-09-02: 5 mL via OROMUCOSAL

## 2016-09-02 MED ORDER — SODIUM CHLORIDE 0.9 % IV SOLN
INTRAVENOUS | Status: DC
  Administered 2016-09-02 – 2016-09-03 (×3): via INTRAVENOUS

## 2016-09-02 NOTE — Care Management Note (Signed)
Case Management Note  Patient Details  Name: Kent [name] MRN: [id number] Date of Birth: 12-11-1940  Subjective/Objective:     Admitted with abdominal pain and distention/ SBO, medical history significant of GERD recurrent small bowel obstruction, sp subtotal colectomy. Chronic back pain. Resides with wife. Independent with ADL's , no DME usage. Family to transport pt to home @ d/c. Phamacy: [facility], [place].     Raiden Haydu (Spouse) Michah Minton (Son)     [phone]     PCP: Shirline Frees  Action/Plan: NPO, NGT to be placed..... General surgery following. CM to f/u with disposition needs.  Expected Discharge Date:                  Expected Discharge Plan:  Home/Self Care  In-House Referral:     Discharge planning Services  CM Consult  Post Acute Care Choice:    Choice offered to:     DME Arranged:    DME Agency:     HH Arranged:    HH Agency:     Status of Service:  In process, will continue to follow  If discussed at Long Length of Stay Meetings, dates discussed:    Additional Comments:  Sharin Mons, RN 09/02/2016, 10:58 AM

## 2016-09-02 NOTE — Progress Notes (Signed)
[facility] Progress Note  Subjective: Reports no issues over night. Had one diarrhea bowel movement in ER and a second episode of diarrhea upon arriving to the floor. No BMs or flatus since.Endorses mild to moderate persistent nausea. No vomiting. Reports continued mild abdominal pain to the right of his umbilicus.  Objective: Vital signs in last 24 hours: Temp:  [97.2 F (36.2 C)-97.9 F (36.6 C)] 97.9 F (36.6 C) (03/13 0607) Pulse Rate:  [67-110] 67 (03/13 0607) Resp:  [10-20] 18 (03/13 0607) BP: (91-131)/(48-108) 112/48 (03/13 0607) SpO2:  [93 %-100 %] 100 % (03/13 0607) Weight:  [73.5 kg (162 lb)] 73.5 kg (162 lb) (03/12 1525) Last BM Date: 09/01/16  Intake/Output from previous day: No intake/output data recorded. Intake/Output this shift: No intake/output data recorded.  PE: General: pleasant, WD, WN white male who is laying in bed in NAD HEENT: head is normocephalic, atraumatic.  Sclera are not injected and not icteric. Mouth is pink and dry. Heart: regular, rate, and rhythm.  Normal s1,s2. No obvious murmurs. Lungs: CTAB, no wheezes, rhonchi, or rales noted.  Respiratory effort nonlabored Abd: soft, mild tenderness to palpation to the right of his umbilicus, ND, hypoactive BS. No clinical signs of peritonitis. MS: all 4 extremities are symmetrical with no cyanosis, clubbing, or edema. Skin: warm and dry with no masses, lesions, or rashes Psych: A&Ox3 with an appropriate affect. Lab Results:   Recent Labs  09/01/16 1527  WBC 12.9*  HGB 15.2  HCT 44.6  PLT 261   BMET  Recent Labs  09/01/16 1527  NA 135  K 4.6  CL 98*  CO2 27  GLUCOSE 138*  BUN 13  CREATININE 1.27*  CALCIUM 10.2   PT/INR No results for input(s): LABPROT, INR in the last 72 hours. CMP     Component Value Date/Time   NA 135 09/01/2016 1527   K 4.6 09/01/2016 1527   CL 98 (L) 09/01/2016 1527   CO2 27 09/01/2016 1527   GLUCOSE 138 (H) 09/01/2016 1527   BUN 13  09/01/2016 1527   CREATININE 1.27 (H) 09/01/2016 1527   CALCIUM 10.2 09/01/2016 1527   PROT 8.1 09/01/2016 1527   ALBUMIN 4.7 09/01/2016 1527   AST 24 09/01/2016 1527   ALT 25 09/01/2016 1527   ALKPHOS 78 09/01/2016 1527   BILITOT 0.9 09/01/2016 1527   GFRNONAA 54 (L) 09/01/2016 1527   GFRAA >60 09/01/2016 1527   Lipase     Component Value Date/Time   LIPASE 18 09/01/2016 1527       Studies/Results: Dg Abd 1 View  Result Date: 09/02/2016 CLINICAL DATA:  Abdominal pain. EXAM: ABDOMEN - 1 VIEW COMPARISON:  CT 09/01/2016 . FINDINGS: Surgical clips right upper quadrant . Soft tissue structures are unremarkable. Several loops of dilated bowel again noted. Continued follow-up exam suggested demonstrate clearing. No free air. Pelvic calcifications noted consistent phleboliths. Contrast in the bladder prior CT. No acute bony abnormality IMPRESSION: Several loops of distended bowel are again noted. Continued follow-up exams to demonstrate clearing suggested. No free air. Electronically Signed   By: Marcello Moores  Register   On: 09/02/2016 07:32   Ct Abdomen Pelvis W Contrast  Result Date: 09/01/2016 CLINICAL DATA:  76 year old male with history of diffuse abdominal pain for 1 day. Diarrhea and nausea. Evaluate for potential bowel obstruction. EXAM: CT ABDOMEN AND PELVIS WITH CONTRAST TECHNIQUE: Multidetector CT imaging of the abdomen and pelvis was performed using the standard protocol following bolus administration of intravenous contrast. CONTRAST:  60mL ISOVUE-300 IOPAMIDOL (ISOVUE-300) INJECTION 61% COMPARISON:  CT the abdomen and pelvis 02/13/2016. FINDINGS: Lower chest: 2 mm left lower lobe nodule (image 6 of series 205) unchanged over numerous prior examinations dating back to at least 09/01/2014, considered benign. Hepatobiliary: No cystic or solid hepatic lesions. Status post cholecystectomy. Minimal intrahepatic biliary ductal dilatation (unchanged) and mild dilatation of the common bile duct  (10 mm in diameter, also unchanged), likely reflective of benign post cholecystectomy no pancreatic mass. No pancreatic ductal dilatation. No pancreatic or peripancreatic fluid or inflammatory changes. Pancreas: Unremarkable. Spleen: Unremarkable. Adrenals/Urinary Tract: Bilateral adrenal glands and bilateral kidneys are normal in appearance. No hydroureteronephrosis. Urinary bladder is normal in appearance. Stomach/Bowel: The appearance of the stomach is normal. Status post subtotal colectomy. In the left side of the abdomen there is a focally dilated loop of small bowel that contains a large amount of fluid measuring up to 4.6 cm in diameter (image 32 of series 201), which appears to be jejunal. This abruptly transitions to normal caliber bowel in an area where there is some mild angulation, which could suggest the presence of some adhesions. No other definite pathologic dilatation of small bowel is noted. Several loops of distal small bowel appear mildly thickened, particularly in the right side of the abdomen. Several loops in this region also appear clustered, suggesting the presence of adhesions. Large amount of liquid stool throughout the remaining sigmoid colon and rectum compatible with the reported clinical history of diarrhea. Vascular/Lymphatic: Aortic atherosclerosis, without evidence of aneurysm or dissection in the abdominal or pelvic vasculature. No lymphadenopathy noted in the abdomen or pelvis. Reproductive: Prostate gland and seminal vesicles are unremarkable in appearance. Other: No significant volume of ascites.  No pneumoperitoneum. Musculoskeletal: There are no aggressive appearing lytic or blastic lesions noted in the visualized portions of the skeleton. IMPRESSION: 1. There is multifocal clustering of small bowel loops in the abdomen and pelvis, suggesting the presence of underlying adhesions. In the left upper quadrant there is a loop of jejunum which appears mildly dilated and  fluid-filled, which could suggest very mild partial obstruction. However, there is a relatively large volume of distal colorectal liquid stool. 2. Status post subtotal colectomy. 3. Aortic atherosclerosis. 4. Additional incidental findings, as above. Electronically Signed   By: Vinnie Langton M.D.   On: 09/01/2016 18:08    Anti-infectives: Anti-infectives    None       Assessment/Plan HD # 2 with pSBO. Some clinical evidence of bowel function, but still with dilated loops of bowel on today's films and persistent nausea. Will suggest NGT placement with small bowel protocol. Continue NPO/bowel rest/IVF/Zofran. VTE: heparin 5000u SQ Q8hrs.     LOS: 1 day    LEE Nyzier Boivin, [facility] 09/02/2016, 8:43 AM

## 2016-09-02 NOTE — Progress Notes (Signed)
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 He had a large bowel movement in the emergency department, but due to continued abdominal pain, was admitted for further evaluation and treatment.  Assessment & Plan:   Active Problems:   SBO (small bowel obstruction)   GERD (gastroesophageal reflux disease)   Dehydration   Postural dizziness with presyncope   Leukocytosis   Nonspecific abnormal electrocardiogram (ECG) (EKG)  Partial SBO -Recurrent SBO -General surgery following -NGT  -SBO protocol  -IVF   Elevated liver enzymes -Trend LFT   EKG abnormality -EKG with NSR and J-point elevation in anterolateral leads -Troponin x 3 negative  -No chest pain  -Echo pending   DVT prophylaxis: subq hep  Code Status: Full Family Communication: wife at bedside Disposition Plan: pending further improvement   Consultants:   CCS  Procedures:   None  Antimicrobials:   None    Subjective: Patient states that this is approximately his third episode of bowel obstruction. He states that he had a small bowel movement in the emergency department, but has not been passing gas or bowel since being in his room. Denies any nausea or vomiting. No abdominal pain this morning.  Objective: Vitals:   09/01/16 2045 09/01/16 2225 09/02/16 0607 09/02/16 1351  BP: 96/62 121/76 (!) 112/48 125/73  Pulse: 87 87 67 90  Resp: 16 18 18 20   Temp:  97.9 F (36.6 C) 97.9 F (36.6 C) 98.4 F (36.9 C)  TempSrc:  Oral Oral Oral  SpO2: 96% 100% 100% 98%  Weight:      Height:        Intake/Output Summary (Last 24  hours) at 09/02/16 1440 Last data filed at 09/02/16 9381  Gross per 24 hour  Intake                0 ml  Output                0 ml  Net                0 ml   Filed Weights   09/01/16 1525  Weight: 73.5 kg (162 lb)    Examination:  General exam: Appears calm and comfortable  Respiratory system: Clear to auscultation. Respiratory effort normal. Cardiovascular system: S1 & S2 heard, RRR. No JVD, murmurs, rubs, gallops or clicks. No pedal edema.  Gastrointestinal system: Abdomen is nondistended, soft and nontender. No organomegaly or masses felt. Normal bowel sounds heard. Central nervous system: Alert and oriented. No focal neurological deficits. Extremities: Symmetric 5 x 5 power. Skin: No rashes, lesions or ulcers Psychiatry: Judgement and insight appear normal. Mood & affect appropriate.   Data Reviewed: I have personally reviewed following labs and imaging studies  CBC:  Recent Labs Lab 09/01/16 1527 09/02/16 0840  WBC 12.9* 4.2  HGB 15.2 12.3*  HCT 44.6 36.9*  MCV 85.9 86.6  PLT 261 017   Basic Metabolic Panel:  Recent Labs Lab 09/01/16 1527 09/01/16 2036 09/02/16 0840  NA 135  --  138  K 4.6  --  4.7  CL 98*  --  106  CO2  27  --  27  GLUCOSE 138*  --  104*  BUN 13  --  15  CREATININE 1.27*  --  1.24  CALCIUM 10.2  --  9.1  MG  --  2.2 2.1  PHOS  --   --  3.2   GFR: Estimated Creatinine Clearance: 55.2 mL/min (by C-G formula based on SCr of 1.24 mg/dL). Liver Function Tests:  Recent Labs Lab 09/01/16 1527 09/02/16 0840  AST 24 178*  ALT 25 140*  ALKPHOS 78 106  BILITOT 0.9 1.1  PROT 8.1 6.3*  ALBUMIN 4.7 3.6    Recent Labs Lab 09/01/16 1527  LIPASE 18   No results for input(s): AMMONIA in the last 168 hours. Coagulation Profile: No results for input(s): INR, PROTIME in the last 168 hours. Cardiac Enzymes:  Recent Labs Lab 09/01/16 2036 09/02/16 0154 09/02/16 0840  TROPONINI <0.03 <0.03 <0.03   BNP (last 3 results) No results  for input(s): PROBNP in the last 8760 hours. HbA1C: No results for input(s): HGBA1C in the last 72 hours. CBG: No results for input(s): GLUCAP in the last 168 hours. Lipid Profile: No results for input(s): CHOL, HDL, LDLCALC, TRIG, CHOLHDL, LDLDIRECT in the last 72 hours. Thyroid Function Tests:  Recent Labs  09/02/16 0154  TSH 1.096   Anemia Panel: No results for input(s): VITAMINB12, FOLATE, FERRITIN, TIBC, IRON, RETICCTPCT in the last 72 hours. Sepsis Labs: No results for input(s): PROCALCITON, LATICACIDVEN in the last 168 hours.  No results found for this or any previous visit (from the past 240 hour(s)).     Radiology Studies: Dg Abd 1 View  Result Date: 09/02/2016 CLINICAL DATA:  NG tube placement EXAM: ABDOMEN - 1 VIEW COMPARISON:  09/02/2016, earlier the same day. FINDINGS: NG tube tip is identified in the distal stomach. Bowel gas pattern is nonspecific. IMPRESSION: NG tube tip is in the distal stomach. Electronically Signed   By: Misty Stanley M.D.   On: 09/02/2016 13:50   Dg Abd 1 View  Result Date: 09/02/2016 CLINICAL DATA:  Abdominal pain. EXAM: ABDOMEN - 1 VIEW COMPARISON:  CT 09/01/2016 . FINDINGS: Surgical clips right upper quadrant . Soft tissue structures are unremarkable. Several loops of dilated bowel again noted. Continued follow-up exam suggested demonstrate clearing. No free air. Pelvic calcifications noted consistent phleboliths. Contrast in the bladder prior CT. No acute bony abnormality IMPRESSION: Several loops of distended bowel are again noted. Continued follow-up exams to demonstrate clearing suggested. No free air. Electronically Signed   By: Marcello Moores  Register   On: 09/02/2016 07:32   Ct Abdomen Pelvis W Contrast  Result Date: 09/01/2016 CLINICAL DATA:  76 year old male with history of diffuse abdominal pain for 1 day. Diarrhea and nausea. Evaluate for potential bowel obstruction. EXAM: CT ABDOMEN AND PELVIS WITH CONTRAST TECHNIQUE: Multidetector CT  imaging of the abdomen and pelvis was performed using the standard protocol following bolus administration of intravenous contrast. CONTRAST:  15mL ISOVUE-300 IOPAMIDOL (ISOVUE-300) INJECTION 61% COMPARISON:  CT the abdomen and pelvis 02/13/2016. FINDINGS: Lower chest: 2 mm left lower lobe nodule (image 6 of series 205) unchanged over numerous prior examinations dating back to at least 09/01/2014, considered benign. Hepatobiliary: No cystic or solid hepatic lesions. Status post cholecystectomy. Minimal intrahepatic biliary ductal dilatation (unchanged) and mild dilatation of the common bile duct (10 mm in diameter, also unchanged), likely reflective of benign post cholecystectomy no pancreatic mass. No pancreatic ductal dilatation. No pancreatic or peripancreatic fluid or inflammatory changes. Pancreas: Unremarkable. Spleen: Unremarkable. Adrenals/Urinary  Tract: Bilateral adrenal glands and bilateral kidneys are normal in appearance. No hydroureteronephrosis. Urinary bladder is normal in appearance. Stomach/Bowel: The appearance of the stomach is normal. Status post subtotal colectomy. In the left side of the abdomen there is a focally dilated loop of small bowel that contains a large amount of fluid measuring up to 4.6 cm in diameter (image 32 of series 201), which appears to be jejunal. This abruptly transitions to normal caliber bowel in an area where there is some mild angulation, which could suggest the presence of some adhesions. No other definite pathologic dilatation of small bowel is noted. Several loops of distal small bowel appear mildly thickened, particularly in the right side of the abdomen. Several loops in this region also appear clustered, suggesting the presence of adhesions. Large amount of liquid stool throughout the remaining sigmoid colon and rectum compatible with the reported clinical history of diarrhea. Vascular/Lymphatic: Aortic atherosclerosis, without evidence of aneurysm or dissection  in the abdominal or pelvic vasculature. No lymphadenopathy noted in the abdomen or pelvis. Reproductive: Prostate gland and seminal vesicles are unremarkable in appearance. Other: No significant volume of ascites.  No pneumoperitoneum. Musculoskeletal: There are no aggressive appearing lytic or blastic lesions noted in the visualized portions of the skeleton. IMPRESSION: 1. There is multifocal clustering of small bowel loops in the abdomen and pelvis, suggesting the presence of underlying adhesions. In the left upper quadrant there is a loop of jejunum which appears mildly dilated and fluid-filled, which could suggest very mild partial obstruction. However, there is a relatively large volume of distal colorectal liquid stool. 2. Status post subtotal colectomy. 3. Aortic atherosclerosis. 4. Additional incidental findings, as above. Electronically Signed   By: Vinnie Langton M.D.   On: 09/01/2016 18:08      Scheduled Meds: . heparin subcutaneous  5,000 Units Subcutaneous Q8H  . sodium chloride flush  3 mL Intravenous Q12H   Continuous Infusions: . sodium chloride 100 mL/hr at 09/02/16 0959     LOS: 1 day    Time spent: 40 minutes   Dessa Phi, DO [facility] [email or web address] Password [email or web address] 09/02/2016, 2:40 PM

## 2016-09-02 NOTE — Progress Notes (Signed)
  Echocardiogram 2D Echocardiogram has been performed.  Kent [name] 09/02/2016, 3:12 PM

## 2016-09-03 LAB — BASIC METABOLIC PANEL
Anion gap: 10 (ref 5–15)
BUN: 15 mg/dL (ref 6–20)
CALCIUM: 9.1 mg/dL (ref 8.9–10.3)
CO2: 22 mmol/L (ref 22–32)
Chloride: 108 mmol/L (ref 101–111)
Creatinine, Ser: 0.95 mg/dL (ref 0.61–1.24)
GFR calc Af Amer: >60 mL/min (ref >60)
Glucose, Bld: 69 mg/dL (ref 65–99)
POTASSIUM: 4 mmol/L (ref 3.5–5.1)
Sodium: 140 mmol/L (ref 135–145)

## 2016-09-03 LAB — CBC
HCT: 36.3 % — ABNORMAL LOW (ref 39.0–52.0)
Hemoglobin: 11.9 g/dL — ABNORMAL LOW (ref 13.0–17.0)
MCH: 28.7 pg (ref 26.0–34.0)
MCHC: 32.8 g/dL (ref 30.0–36.0)
MCV: 87.7 fL (ref 78.0–100.0)
PLATELETS: 172 10*3/uL (ref 150–400)
RBC: 4.14 MIL/uL — AB (ref 4.22–5.81)
RDW: 13.8 % (ref 11.5–15.5)
WBC: 4.2 10*3/uL (ref 4.0–10.5)

## 2016-09-03 LAB — HEPATIC FUNCTION PANEL
ALT: 98 U/L — AB (ref 17–63)
AST: 69 U/L — ABNORMAL HIGH (ref 15–41)
Albumin: 3.5 g/dL (ref 3.5–5.0)
Alkaline Phosphatase: 103 U/L (ref 38–126)
BILIRUBIN DIRECT: 0.2 mg/dL (ref 0.1–0.5)
BILIRUBIN INDIRECT: 0.7 mg/dL (ref 0.3–0.9)
Total Bilirubin: 0.9 mg/dL (ref 0.3–1.2)
Total Protein: 6.3 g/dL — ABNORMAL LOW (ref 6.5–8.1)

## 2016-09-03 MED ORDER — OXYCODONE HCL ER 10 MG PO T12A
20.0000 mg | EXTENDED_RELEASE_TABLET | Freq: Two times a day (BID) | ORAL | Status: DC
  Administered 2016-09-03 – 2016-09-04 (×3): 20 mg via ORAL
  Filled 2016-09-03 (×3): qty 2

## 2016-09-03 MED ORDER — MORPHINE SULFATE (PF) 2 MG/ML IV SOLN
2.0000 mg | INTRAVENOUS | Status: DC | PRN
  Administered 2016-09-03: 2 mg via INTRAVENOUS
  Filled 2016-09-03: qty 1

## 2016-09-03 MED ORDER — METHOCARBAMOL 500 MG PO TABS
1000.0000 mg | ORAL_TABLET | Freq: Two times a day (BID) | ORAL | Status: DC
  Administered 2016-09-03 – 2016-09-04 (×3): 1000 mg via ORAL
  Filled 2016-09-03 (×3): qty 2

## 2016-09-03 MED ORDER — OXYCODONE HCL 20 MG PO TB12: 20.0000 mg | ORAL_TABLET | Freq: Two times a day (BID) | ORAL | Status: DC

## 2016-09-03 NOTE — Progress Notes (Signed)
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 He had a large bowel movement in the emergency department, but due to continued abdominal pain, was admitted for further evaluation and treatment of SBO.   Assessment & Plan:   Active Problems:   SBO (small bowel obstruction)   GERD (gastroesophageal reflux disease)   Dehydration   Postural dizziness with presyncope   Leukocytosis   Nonspecific abnormal electrocardiogram (ECG) (EKG)  Partial SBO -Recurrent SBO -General surgery following -Clear liquid diet started by surgery   Elevated liver enzymes -Improving with IVF   EKG abnormality -EKG with NSR and J-point elevation in anterolateral leads -Troponin x 3 negative  -No chest pain  -Echo with EF 65-70%, no wall motion abnormalities, grade 1 diastolic dysfunction   Chronic back pain -Home oxycodone and robaxin   DVT prophylaxis: subq hep  Code Status: Full Family Communication: wife at bedside Disposition Plan: pending further improvement   Consultants:   CCS  Procedures:   None  Antimicrobials:   None    Subjective: Patient states that he has had about 8 bowel movements since he was started on small bowel protocol. Also had some nausea but no vomiting. States that his abdomen feels about the same without any pain.  Objective: Vitals:   09/02/16 0607 09/02/16 1351 09/02/16 2205 09/03/16 0429  BP: (!) 112/48 125/73 122/71 125/67  Pulse: 67 90 80 83  Resp: 18 20 17 17   Temp: 97.9 F (36.6 C) 98.4 F (36.9 C) 98.2 F (36.8 C)   TempSrc: Oral Oral Oral   SpO2: 100% 98% 90% 97%    Weight:      Height:        Intake/Output Summary (Last 24 hours) at 09/03/16 1151 Last data filed at 09/03/16 0545  Gross per 24 hour  Intake          2026.67 ml  Output              350 ml  Net          1676.67 ml   Filed Weights   09/01/16 1525  Weight: 73.5 kg (162 lb)    Examination:  General exam: Appears calm and comfortable  Respiratory system: Clear to auscultation. Respiratory effort normal. Cardiovascular system: S1 & S2 heard, RRR. No JVD, murmurs, rubs, gallops or clicks. No pedal edema.  Gastrointestinal system: Abdomen is nondistended, soft and nontender. No organomegaly or masses felt. Normal bowel sounds heard. Central nervous system: Alert and oriented. No focal neurological deficits. Extremities: Symmetric 5 x 5 power. Skin: No rashes, lesions or ulcers Psychiatry: Judgement and insight appear normal. Mood & affect appropriate.   Data Reviewed: I have personally reviewed following labs and imaging studies  CBC:  Recent Labs Lab 09/01/16 1527 09/02/16 0840 09/03/16 0459  WBC 12.9* 4.2 4.2  HGB 15.2 12.3* 11.9*  HCT 44.6 36.9* 36.3*  MCV 85.9 86.6 87.7  PLT 261 167 665   Basic Metabolic Panel:  Recent Labs Lab 09/01/16 1527 09/01/16 2036 09/02/16 0840 09/03/16 0459  NA 135  --  138 140  K 4.6  --  4.7 4.0  CL 98*  --  106 108  CO2 27  --  27 22  GLUCOSE 138*  --  104* 69  BUN 13  --  15 15  CREATININE 1.27*  --  1.24 0.95  CALCIUM 10.2  --  9.1 9.1  MG  --  2.2 2.1  --   PHOS  --   --  3.2  --    GFR: Estimated Creatinine Clearance: 72 mL/min (by C-G formula based on SCr of 0.95 mg/dL). Liver Function Tests:  Recent Labs Lab 09/01/16 1527 09/02/16 0840 09/03/16 0459  AST 24 178* 69*  ALT 25 140* 98*  ALKPHOS 78 106 103  BILITOT 0.9 1.1 0.9  PROT 8.1 6.3* 6.3*  ALBUMIN 4.7 3.6 3.5    Recent Labs Lab 09/01/16 1527  LIPASE 18   No results for input(s): AMMONIA in the last 168 hours. Coagulation Profile: No results for  input(s): INR, PROTIME in the last 168 hours. Cardiac Enzymes:  Recent Labs Lab 09/01/16 2036 09/02/16 0154 09/02/16 0840  TROPONINI <0.03 <0.03 <0.03   BNP (last 3 results) No results for input(s): PROBNP in the last 8760 hours. HbA1C: No results for input(s): HGBA1C in the last 72 hours. CBG: No results for input(s): GLUCAP in the last 168 hours. Lipid Profile: No results for input(s): CHOL, HDL, LDLCALC, TRIG, CHOLHDL, LDLDIRECT in the last 72 hours. Thyroid Function Tests:  Recent Labs  09/02/16 0154  TSH 1.096   Anemia Panel: No results for input(s): VITAMINB12, FOLATE, FERRITIN, TIBC, IRON, RETICCTPCT in the last 72 hours. Sepsis Labs: No results for input(s): PROCALCITON, LATICACIDVEN in the last 168 hours.  No results found for this or any previous visit (from the past 240 hour(s)).     Radiology Studies: Dg Abd 1 View  Result Date: 09/02/2016 CLINICAL DATA:  NG tube placement EXAM: ABDOMEN - 1 VIEW COMPARISON:  09/02/2016, earlier the same day. FINDINGS: NG tube tip is identified in the distal stomach. Bowel gas pattern is nonspecific. IMPRESSION: NG tube tip is in the distal stomach. Electronically Signed   By: Misty Stanley M.D.   On: 09/02/2016 13:50   Dg Abd 1 View  Result Date: 09/02/2016 CLINICAL DATA:  Abdominal pain. EXAM: ABDOMEN - 1 VIEW COMPARISON:  CT 09/01/2016 . FINDINGS: Surgical clips right upper quadrant . Soft tissue structures are unremarkable. Several loops of dilated bowel again noted. Continued follow-up exam suggested demonstrate clearing. No free air. Pelvic calcifications noted consistent phleboliths. Contrast in the bladder prior CT. No acute bony abnormality IMPRESSION: Several loops of distended bowel are again noted. Continued follow-up exams to demonstrate clearing suggested. No free air. Electronically Signed   By: Marcello Moores  Register   On: 09/02/2016 07:32   Ct Abdomen Pelvis W Contrast  Result Date: 09/01/2016 CLINICAL DATA:   76 year old male with history of diffuse abdominal pain for 1 day. Diarrhea and nausea. Evaluate for potential bowel obstruction. EXAM: CT ABDOMEN AND PELVIS WITH CONTRAST TECHNIQUE: Multidetector CT imaging of the abdomen and pelvis was performed using the standard protocol following bolus administration of intravenous contrast. CONTRAST:  61mL ISOVUE-300 IOPAMIDOL (ISOVUE-300) INJECTION 61% COMPARISON:  CT the abdomen and pelvis 02/13/2016. FINDINGS: Lower chest: 2 mm left lower lobe nodule (image 6 of series 205) unchanged over numerous prior examinations dating back to at least 09/01/2014, considered benign. Hepatobiliary: No cystic or solid hepatic lesions. Status post cholecystectomy. Minimal intrahepatic biliary ductal dilatation (unchanged) and mild dilatation of the common bile duct (10 mm in diameter,  also unchanged), likely reflective of benign post cholecystectomy no pancreatic mass. No pancreatic ductal dilatation. No pancreatic or peripancreatic fluid or inflammatory changes. Pancreas: Unremarkable. Spleen: Unremarkable. Adrenals/Urinary Tract: Bilateral adrenal glands and bilateral kidneys are normal in appearance. No hydroureteronephrosis. Urinary bladder is normal in appearance. Stomach/Bowel: The appearance of the stomach is normal. Status post subtotal colectomy. In the left side of the abdomen there is a focally dilated loop of small bowel that contains a large amount of fluid measuring up to 4.6 cm in diameter (image 32 of series 201), which appears to be jejunal. This abruptly transitions to normal caliber bowel in an area where there is some mild angulation, which could suggest the presence of some adhesions. No other definite pathologic dilatation of small bowel is noted. Several loops of distal small bowel appear mildly thickened, particularly in the right side of the abdomen. Several loops in this region also appear clustered, suggesting the presence of adhesions. Large amount of liquid  stool throughout the remaining sigmoid colon and rectum compatible with the reported clinical history of diarrhea. Vascular/Lymphatic: Aortic atherosclerosis, without evidence of aneurysm or dissection in the abdominal or pelvic vasculature. No lymphadenopathy noted in the abdomen or pelvis. Reproductive: Prostate gland and seminal vesicles are unremarkable in appearance. Other: No significant volume of ascites.  No pneumoperitoneum. Musculoskeletal: There are no aggressive appearing lytic or blastic lesions noted in the visualized portions of the skeleton. IMPRESSION: 1. There is multifocal clustering of small bowel loops in the abdomen and pelvis, suggesting the presence of underlying adhesions. In the left upper quadrant there is a loop of jejunum which appears mildly dilated and fluid-filled, which could suggest very mild partial obstruction. However, there is a relatively large volume of distal colorectal liquid stool. 2. Status post subtotal colectomy. 3. Aortic atherosclerosis. 4. Additional incidental findings, as above. Electronically Signed   By: Vinnie Langton M.D.   On: 09/01/2016 18:08   Dg Abd Portable 1v-small Bowel Obstruction Protocol-initial, 8 Hr Delay  Result Date: 09/02/2016 CLINICAL DATA:  Small bowel obstruction.  8 hour delayed. EXAM: PORTABLE ABDOMEN - 1 VIEW COMPARISON:  09/02/2016 at 1314 hours FINDINGS: An enteric tube is present with tip in the right upper quadrant consistent with location in the distal stomach. Surgical clips in the right upper quadrant. Contrast material is demonstrated in the descending colon and rectum suggesting no evidence of significant bowel obstruction. Gas-filled colon is present. No small or large bowel distention is identified. IMPRESSION: Contrast material is demonstrated in the descending colon and rectum suggesting no evidence of obstruction. Electronically Signed   By: Lucienne Capers M.D.   On: 09/02/2016 22:31      Scheduled Meds: . heparin  subcutaneous  5,000 Units Subcutaneous Q8H  . sodium chloride flush  3 mL Intravenous Q12H   Continuous Infusions: . sodium chloride 100 mL/hr at 09/03/16 0545     LOS: 2 days    Time spent: 30 minutes   Dessa Phi, DO [facility] [email or web address] Password [facility] 09/03/2016, 11:51 AM

## 2016-09-03 NOTE — Progress Notes (Signed)
Subjective: Passing bms/flatus, no n/v  Objective: Vital signs in last 24 hours: Temp:  [98.2 F (36.8 C)-98.4 F (36.9 C)] 98.2 F (36.8 C) (03/13 2205) Pulse Rate:  [80-90] 83 (03/14 0429) Resp:  [17-20] 17 (03/14 0429) BP: (122-125)/(67-73) 125/67 (03/14 0429) SpO2:  [90 %-98 %] 97 % (03/14 0429) Last BM Date: 09/02/16  Intake/Output from previous day: 03/13 0701 - 03/14 0700 In: 2026.7 [I.V.:1976.7; NG/GT:50] Out: 350 [Urine:350] Intake/Output this shift: No intake/output data recorded.  GI: soft nontender nondistended bs present  Lab Results:   Recent Labs  09/02/16 0840 09/03/16 0459  WBC 4.2 4.2  HGB 12.3* 11.9*  HCT 36.9* 36.3*  PLT 167 172   BMET  Recent Labs  09/02/16 0840 09/03/16 0459  NA 138 140  K 4.7 4.0  CL 106 108  CO2 27 22  GLUCOSE 104* 69  BUN 15 15  CREATININE 1.24 0.95  CALCIUM 9.1 9.1   PT/INR No results for input(s): LABPROT, INR in the last 72 hours. ABG No results for input(s): PHART, HCO3 in the last 72 hours.  Invalid input(s): PCO2, PO2  Studies/Results: Dg Abd 1 View  Result Date: 09/02/2016 CLINICAL DATA:  NG tube placement EXAM: ABDOMEN - 1 VIEW COMPARISON:  09/02/2016, earlier the same day. FINDINGS: NG tube tip is identified in the distal stomach. Bowel gas pattern is nonspecific. IMPRESSION: NG tube tip is in the distal stomach. Electronically Signed   By: Misty Stanley M.D.   On: 09/02/2016 13:50   Dg Abd 1 View  Result Date: 09/02/2016 CLINICAL DATA:  Abdominal pain. EXAM: ABDOMEN - 1 VIEW COMPARISON:  CT 09/01/2016 . FINDINGS: Surgical clips right upper quadrant . Soft tissue structures are unremarkable. Several loops of dilated bowel again noted. Continued follow-up exam suggested demonstrate clearing. No free air. Pelvic calcifications noted consistent phleboliths. Contrast in the bladder prior CT. No acute bony abnormality IMPRESSION: Several loops of distended bowel are again noted. Continued follow-up exams  to demonstrate clearing suggested. No free air. Electronically Signed   By: Marcello Moores  Register   On: 09/02/2016 07:32   Ct Abdomen Pelvis W Contrast  Result Date: 09/01/2016 CLINICAL DATA:  76 year old male with history of diffuse abdominal pain for 1 day. Diarrhea and nausea. Evaluate for potential bowel obstruction. EXAM: CT ABDOMEN AND PELVIS WITH CONTRAST TECHNIQUE: Multidetector CT imaging of the abdomen and pelvis was performed using the standard protocol following bolus administration of intravenous contrast. CONTRAST:  63mL ISOVUE-300 IOPAMIDOL (ISOVUE-300) INJECTION 61% COMPARISON:  CT the abdomen and pelvis 02/13/2016. FINDINGS: Lower chest: 2 mm left lower lobe nodule (image 6 of series 205) unchanged over numerous prior examinations dating back to at least 09/01/2014, considered benign. Hepatobiliary: No cystic or solid hepatic lesions. Status post cholecystectomy. Minimal intrahepatic biliary ductal dilatation (unchanged) and mild dilatation of the common bile duct (10 mm in diameter, also unchanged), likely reflective of benign post cholecystectomy no pancreatic mass. No pancreatic ductal dilatation. No pancreatic or peripancreatic fluid or inflammatory changes. Pancreas: Unremarkable. Spleen: Unremarkable. Adrenals/Urinary Tract: Bilateral adrenal glands and bilateral kidneys are normal in appearance. No hydroureteronephrosis. Urinary bladder is normal in appearance. Stomach/Bowel: The appearance of the stomach is normal. Status post subtotal colectomy. In the left side of the abdomen there is a focally dilated loop of small bowel that contains a large amount of fluid measuring up to 4.6 cm in diameter (image 32 of series 201), which appears to be jejunal. This abruptly transitions to normal caliber bowel in an area  where there is some mild angulation, which could suggest the presence of some adhesions. No other definite pathologic dilatation of small bowel is noted. Several loops of distal small  bowel appear mildly thickened, particularly in the right side of the abdomen. Several loops in this region also appear clustered, suggesting the presence of adhesions. Large amount of liquid stool throughout the remaining sigmoid colon and rectum compatible with the reported clinical history of diarrhea. Vascular/Lymphatic: Aortic atherosclerosis, without evidence of aneurysm or dissection in the abdominal or pelvic vasculature. No lymphadenopathy noted in the abdomen or pelvis. Reproductive: Prostate gland and seminal vesicles are unremarkable in appearance. Other: No significant volume of ascites.  No pneumoperitoneum. Musculoskeletal: There are no aggressive appearing lytic or blastic lesions noted in the visualized portions of the skeleton. IMPRESSION: 1. There is multifocal clustering of small bowel loops in the abdomen and pelvis, suggesting the presence of underlying adhesions. In the left upper quadrant there is a loop of jejunum which appears mildly dilated and fluid-filled, which could suggest very mild partial obstruction. However, there is a relatively large volume of distal colorectal liquid stool. 2. Status post subtotal colectomy. 3. Aortic atherosclerosis. 4. Additional incidental findings, as above. Electronically Signed   By: Vinnie Langton M.D.   On: 09/01/2016 18:08   Dg Abd Portable 1v-small Bowel Obstruction Protocol-initial, 8 Hr Delay  Result Date: 09/02/2016 CLINICAL DATA:  Small bowel obstruction.  8 hour delayed. EXAM: PORTABLE ABDOMEN - 1 VIEW COMPARISON:  09/02/2016 at 1314 hours FINDINGS: An enteric tube is present with tip in the right upper quadrant consistent with location in the distal stomach. Surgical clips in the right upper quadrant. Contrast material is demonstrated in the descending colon and rectum suggesting no evidence of significant bowel obstruction. Gas-filled colon is present. No small or large bowel distention is identified. IMPRESSION: Contrast material is  demonstrated in the descending colon and rectum suggesting no evidence of obstruction. Electronically Signed   By: Lucienne Capers M.D.   On: 09/02/2016 22:31    Anti-infectives: Anti-infectives    None      Assessment/Plan: Sbo, resolved Clears advance diet to fulls later If tolerates fulls can dc home  [facility] 09/03/2016

## 2016-09-04 NOTE — Progress Notes (Signed)
NURSING PROGRESS NOTE  Kent [name] [id number] Discharge Data: 09/04/2016 12:38 PM Attending Provider: Shon Millet* MCN:[id number], [name], MD     [name] to be D/C'd Home per MD order.  Discussed with the patient the After Visit Summary and all questions fully answered. All IV's discontinued with no bleeding noted. All belongings returned to patient for patient to take home.   Last Vital Signs:  Blood pressure 137/68, pulse 64, temperature 97.8 F (36.6 C), temperature source Oral, resp. rate 18, height 5\' 11"  (1.803 m), weight 73.5 kg (162 lb), SpO2 100 %.  Discharge Medication List Allergies as of 09/04/2016   No Known Allergies     Medication List    TAKE these medications   CLARITIN-D 24 HOUR PO Take 1 tablet by mouth daily.   HYDROcodone-acetaminophen 10-325 MG tablet Commonly known as:  NORCO Take 1 tablet by mouth every 6 (six) hours as needed for moderate pain.   lidocaine 5 % Commonly known as:  LIDODERM Place 1 patch onto the skin daily as needed (for pain). Remove & Discard patch within 12 hours or as directed by MD   methocarbamol 500 MG tablet Commonly known as:  ROBAXIN Take 1,000 mg by mouth 2 (two) times daily.   omeprazole 20 MG capsule Commonly known as:  PRILOSEC Take 20 mg by mouth daily.   oxyCODONE 20 MG 12 hr tablet Commonly known as:  OXYCONTIN Take 20 mg by mouth every 12 (twelve) hours.   PROBIOTIC PO Take 1 tablet by mouth daily.   VITAMIN C PO Take 1 tablet by mouth daily.

## 2016-09-04 NOTE — Discharge Instructions (Signed)
Small Bowel Obstruction °A small bowel obstruction is a blockage in the small bowel. The small bowel, which is also called the small intestine, is a long, slender tube that connects the stomach to the colon. When a person eats and drinks, food and fluids go from the stomach to the small bowel. This is where most of the nutrients in the food and fluids are absorbed. °A small bowel obstruction will prevent food and fluids from passing through the small bowel as they normally do during digestion. The small bowel can become partially or completely blocked. This can cause symptoms such as abdominal pain, vomiting, and bloating. If this condition is not treated, it can be dangerous because the small bowel could rupture. °What are the causes? °Common causes of this condition include: °· Scar tissue from previous surgery or radiation treatment. °· Recent surgery. This may cause the movements of the bowel to slow down and cause food to block the intestine. °· Hernias. °· Inflammatory bowel disease (colitis). °· Twisting of the bowel (volvulus). °· Tumors. °· A foreign body. °· Slipping of a part of the bowel into another part (intussusception). °What are the signs or symptoms? °Symptoms of this condition include: °· Abdominal pain. This may be dull cramps or sharp pain. It may occur in one area, or it may be present in the entire abdomen. Pain can range from mild to severe, depending on the degree of obstruction. °· Nausea and vomiting. Vomit may be greenish or a yellow bile color. °· Abdominal bloating. °· Constipation. °· Lack of passing gas. °· Frequent belching. °· Diarrhea. This may occur if the obstruction is partial and runny stool is able to leak around the obstruction. °How is this diagnosed? °This condition may be diagnosed based on a physical exam, medical history, and X-rays of the abdomen. You may also have other tests, such as a CT scan of the abdomen and pelvis. °How is this treated? °Treatment for this  condition depends on the cause and severity of the problem. Treatment options may include: °· Bed rest along with fluids and pain medicines that are given through an IV tube inserted into one of your veins. Sometimes, this is all that is needed for the obstruction to improve. °· Following a simple diet. In some cases, a clear liquid diet may be required for several days. This allows the bowel to rest. °· Placement of a small tube (nasogastric tube) into the stomach. When the bowel is blocked, it usually swells up like a balloon that is filled with air and fluids. The air and fluids may be removed by suction through the nasogastric tube. This can help with pain, discomfort, and nausea. It can also help the obstruction to clear up faster. °· Surgery. This may be required if other treatments do not work. Bowel obstruction from a hernia may require early surgery and can be an emergency procedure. Surgery may also be required for scar tissue that causes frequent or severe obstructions. °Follow these instructions at home: °· Get plenty of rest. °· Follow instructions from your health care provider about eating restrictions. You may need to avoid solid foods and consume only clear liquids until your condition improves. °· Take over-the-counter and prescription medicines only as told by your health care provider. °· Keep all follow-up visits as told by your health care provider. This is important. °Contact a health care provider if: °· You have a fever. °· You have chills. °Get help right away if: °· You have increased   pain or cramping. °· You vomit blood. °· You have uncontrolled vomiting or nausea. °· You cannot drink fluids because of vomiting or pain. °· You develop confusion. °· You begin feeling very dry or thirsty (dehydrated). °· You have severe bloating. °· You feel extremely weak or you faint. °This information is not intended to replace advice given to you by your health care provider. Make sure you discuss any  questions you have with your health care provider. °Document Released: 08/26/2005 Document Revised: 02/04/2016 Document Reviewed: 08/03/2014 °[employer] Interactive Patient Education © 2017 [employer] ° °

## 2016-09-04 NOTE — Discharge Summary (Signed)
Physician Discharge Summary  SIRCHARLES HOLZHEIMER ZSW:[id number] DOB: 1941/02/17 DOA: 09/01/2016  PCP: Shirline Frees, MD  Admit date: 09/01/2016 Discharge date: 09/04/2016  Admitted From: Home Disposition:  Home  Recommendations for Outpatient Follow-up:  1. Follow up with PCP in 1 week 2. 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 He had a large bowel movement in the emergency department, but due to continued abdominal pain, was admitted for further evaluation and treatment of partial SBO. Gen. surgery was consulted, patient underwent small bowel obstruction protocol. Patient had multiple bowel movements following the protocol. Elevated liver enzymes improved with IV fluids. He also underwent echocardiogram due to abnormal EKG on admission. Troponin 3 were negative. He had no complaints of chest pain. Echocardiogram results showed ejection fraction of 65-70%, no wall motion abnormalities, grade 1 diastolic dysfunction. His diet was advanced to clear liquids, then to full liquid diet. He tolerated this well with approximately 2-3 bowel movements over the course of the day, no nausea or vomiting, no worsening abdominal pain or distention.  Discharge Diagnoses:  Active Problems:   SBO (small bowel obstruction)   GERD (gastroesophageal reflux disease)   Dehydration   Postural dizziness with presyncope   Leukocytosis   Nonspecific abnormal electrocardiogram (ECG) (EKG)  Partial SBO -Recurrent SBO.  Resolved.  -General surgery following -Continue to advance diet as tolerated over the next few days  Elevated liver enzymes -Improving with IVF. Repeat labs as outpatient  EKG abnormality -EKG with NSR and J-point elevation in anterolateral leads -Troponin x 3 negative  -No chest pain  -Echo with EF 65-70%, no wall motion abnormalities, grade 1 diastolic dysfunction   Chronic back pain -Home oxycodone and robaxin   Discharge Instructions  Discharge Instructions    Call MD for:  persistant nausea and vomiting    Complete by:  As directed    Call MD for:  severe uncontrolled pain    Complete by:  As directed    Discharge instructions    Complete by:  As directed    Advance diet slowly from full liquid diet to soft diet over the next few days   Increase activity slowly    Complete by:  As directed      Allergies as of 09/04/2016   No Known Allergies     Medication List    TAKE these medications   CLARITIN-D 24 HOUR PO Take 1 tablet by mouth daily.   HYDROcodone-acetaminophen 10-325 MG tablet Commonly known as:  NORCO Take 1 tablet by mouth every 6 (six) hours as needed for moderate pain.   lidocaine 5 % Commonly known as:  LIDODERM Place 1 patch onto the skin daily as needed (for pain). Remove & Discard patch within 12 hours or as directed by MD   methocarbamol 500 MG tablet Commonly known as:  ROBAXIN Take 1,000 mg by mouth 2 (two) times daily.   omeprazole 20 MG capsule Commonly known as:  PRILOSEC Take 20 mg by mouth daily.   oxyCODONE 20 MG 12  hr tablet Commonly known as:  OXYCONTIN Take 20 mg by mouth every 12 (twelve) hours.   PROBIOTIC PO Take 1 tablet by mouth daily.   VITAMIN C PO Take 1 tablet by mouth daily.      Follow-up Information    Shirline Frees, MD. Schedule an appointment as soon as possible for a visit on 09/16/2016.   Specialty:  Family Medicine Why:  Appointment is on 09/16/16 at 12:15pm Contact information: [place] [address] [phone]          No Known Allergies  Consultations:  General surgery    Procedures/Studies: Dg Abd 1 View  Result Date: 09/02/2016 CLINICAL DATA:  NG tube placement EXAM: ABDOMEN - 1 VIEW COMPARISON:  09/02/2016, earlier the same day. FINDINGS: NG tube tip is identified in the distal stomach. Bowel gas pattern is nonspecific. IMPRESSION: NG tube tip is in the distal stomach. Electronically Signed   By: Misty Stanley M.D.   On: 09/02/2016 13:50   Dg Abd 1 View  Result Date: 09/02/2016 CLINICAL DATA:  Abdominal pain. EXAM: ABDOMEN - 1 VIEW COMPARISON:  CT 09/01/2016 . FINDINGS: Surgical clips right upper quadrant . Soft tissue structures are unremarkable. Several loops of dilated bowel again noted. Continued follow-up exam suggested demonstrate clearing. No free air. Pelvic calcifications noted consistent phleboliths. Contrast in the bladder prior CT. No acute bony abnormality IMPRESSION: Several loops of distended bowel are again noted. Continued follow-up exams to demonstrate clearing suggested. No free air. Electronically Signed   By: Marcello Moores  Register   On: 09/02/2016 07:32   Ct Abdomen Pelvis W Contrast  Result Date: 09/01/2016 CLINICAL DATA:  76 year old male with history of diffuse abdominal pain for 1 day. Diarrhea and nausea. Evaluate for potential bowel obstruction. EXAM: CT ABDOMEN AND PELVIS WITH CONTRAST TECHNIQUE: Multidetector CT imaging of the abdomen and pelvis was performed using the standard protocol following bolus administration of intravenous contrast. CONTRAST:  76mL ISOVUE-300 IOPAMIDOL (ISOVUE-300) INJECTION 61% COMPARISON:  CT the abdomen and pelvis 02/13/2016. FINDINGS: Lower chest: 2 mm left lower lobe nodule (image 6 of series 205) unchanged over numerous prior examinations dating back to at least 09/01/2014, considered benign. Hepatobiliary: No cystic or solid hepatic lesions. Status post cholecystectomy. Minimal  intrahepatic biliary ductal dilatation (unchanged) and mild dilatation of the common bile duct (10 mm in diameter, also unchanged), likely reflective of benign post cholecystectomy no pancreatic mass. No pancreatic ductal dilatation. No pancreatic or peripancreatic fluid or inflammatory changes. Pancreas: Unremarkable. Spleen: Unremarkable. Adrenals/Urinary Tract: Bilateral adrenal glands and bilateral kidneys are normal in appearance. No hydroureteronephrosis. Urinary bladder is normal in appearance. Stomach/Bowel: The appearance of the stomach is normal. Status post subtotal colectomy. In the left side of the abdomen there is a focally dilated loop of small bowel that contains a large amount of fluid measuring up to 4.6 cm in diameter (image 32 of series 201), which appears to be jejunal. This abruptly transitions to normal caliber bowel in an area where there is some mild angulation, which could suggest the presence of some adhesions. No other definite pathologic dilatation of small bowel is noted. Several loops of distal small bowel appear mildly thickened, particularly in the right side of the abdomen. Several loops in this region also appear clustered, suggesting the presence of adhesions. Large amount of liquid stool throughout the remaining sigmoid colon and rectum compatible with the reported clinical history of diarrhea. Vascular/Lymphatic: Aortic atherosclerosis, without evidence of aneurysm or dissection in  the abdominal or pelvic vasculature. No lymphadenopathy noted in the abdomen or pelvis. Reproductive: Prostate gland and seminal vesicles are unremarkable in appearance. Other: No significant volume of ascites.  No pneumoperitoneum. Musculoskeletal: There are no aggressive appearing lytic or blastic lesions noted in the visualized portions of the skeleton. IMPRESSION: 1. There is multifocal clustering of small bowel loops in the abdomen and pelvis, suggesting the presence of underlying adhesions. In  the left upper quadrant there is a loop of jejunum which appears mildly dilated and fluid-filled, which could suggest very mild partial obstruction. However, there is a relatively large volume of distal colorectal liquid stool. 2. Status post subtotal colectomy. 3. Aortic atherosclerosis. 4. Additional incidental findings, as above. Electronically Signed   By: Vinnie Langton M.D.   On: 09/01/2016 18:08   Dg Abd Portable 1v-small Bowel Obstruction Protocol-initial, 8 Hr Delay  Result Date: 09/02/2016 CLINICAL DATA:  Small bowel obstruction.  8 hour delayed. EXAM: PORTABLE ABDOMEN - 1 VIEW COMPARISON:  09/02/2016 at 1314 hours FINDINGS: An enteric tube is present with tip in the right upper quadrant consistent with location in the distal stomach. Surgical clips in the right upper quadrant. Contrast material is demonstrated in the descending colon and rectum suggesting no evidence of significant bowel obstruction. Gas-filled colon is present. No small or large bowel distention is identified. IMPRESSION: Contrast material is demonstrated in the descending colon and rectum suggesting no evidence of obstruction. Electronically Signed   By: Lucienne Capers M.D.   On: 09/02/2016 22:31   Echocardiogram  Study Conclusions - Left ventricle: The cavity size was normal. Wall thickness was   normal. Systolic function was vigorous. The estimated ejection   fraction was in the range of 65% to 70%. Wall motion was normal;   there were no regional wall motion abnormalities. Doppler   parameters are consistent with abnormal left ventricular   relaxation (grade 1 diastolic dysfunction). - Left atrium: The atrium was mildly dilated.  Impressions: - Vigorous LV systolic function; grade 1 diastolic dysfunction;   mild LAE.   Discharge Exam: Vitals:   09/03/16 2124 09/04/16 0515  BP: 136/69 137/68  Pulse: 69 64  Resp: 18 18  Temp: 98.2 F (36.8 C) 97.8 F (36.6 C)   Vitals:   09/03/16 0429 09/03/16 1423  09/03/16 2124 09/04/16 0515  BP: 125/67 (!) 141/67 136/69 137/68  Pulse: 83 85 69 64  Resp: 17 18 18 18   Temp:  97.8 F (36.6 C) 98.2 F (36.8 C) 97.8 F (36.6 C)  TempSrc:   Oral Oral  SpO2: 97% 100% 100% 100%  Weight:      Height:        General: Pt is alert, awake, not in acute distress Cardiovascular: RRR, S1/S2 +, no rubs, no gallops Respiratory: CTA bilaterally, no wheezing, no rhonchi Abdominal: Soft, NT, ND, bowel sounds + Extremities: no edema, no cyanosis    The results of significant diagnostics from this hospitalization (including imaging, microbiology, ancillary and laboratory) are listed below for reference.     Microbiology: No results found for this or any previous visit (from the past 240 hour(s)).   Labs: BNP (last 3 results) No results for input(s): BNP in the last 8760 hours. Basic Metabolic Panel:  Recent Labs Lab 09/01/16 1527 09/01/16 2036 09/02/16 0840 09/03/16 0459  NA 135  --  138 140  K 4.6  --  4.7 4.0  CL 98*  --  106 108  CO2 27  --  27 22  GLUCOSE 138*  --  104* 69  BUN 13  --  15 15  CREATININE 1.27*  --  1.24 0.95  CALCIUM 10.2  --  9.1 9.1  MG  --  2.2 2.1  --   PHOS  --   --  3.2  --    Liver Function Tests:  Recent Labs Lab 09/01/16 1527 09/02/16 0840 09/03/16 0459  AST 24 178* 69*  ALT 25 140* 98*  ALKPHOS 78 106 103  BILITOT 0.9 1.1 0.9  PROT 8.1 6.3* 6.3*  ALBUMIN 4.7 3.6 3.5    Recent Labs Lab 09/01/16 1527  LIPASE 18   No results for input(s): AMMONIA in the last 168 hours. CBC:  Recent Labs Lab 09/01/16 1527 09/02/16 0840 09/03/16 0459  WBC 12.9* 4.2 4.2  HGB 15.2 12.3* 11.9*  HCT 44.6 36.9* 36.3*  MCV 85.9 86.6 87.7  PLT 261 167 172   Cardiac Enzymes:  Recent Labs Lab 09/01/16 2036 09/02/16 0154 09/02/16 0840  TROPONINI <0.03 <0.03 <0.03   BNP: Invalid input(s): POCBNP CBG: No results for input(s): GLUCAP in the last 168 hours. D-Dimer No results for input(s): DDIMER in the  last 72 hours. Hgb A1c No results for input(s): HGBA1C in the last 72 hours. Lipid Profile No results for input(s): CHOL, HDL, LDLCALC, TRIG, CHOLHDL, LDLDIRECT in the last 72 hours. Thyroid function studies  Recent Labs  09/02/16 0154  TSH 1.096   Anemia work up No results for input(s): VITAMINB12, FOLATE, FERRITIN, TIBC, IRON, RETICCTPCT in the last 72 hours. Urinalysis    Component Value Date/Time   COLORURINE YELLOW 09/01/2016 1700   APPEARANCEUR HAZY (A) 09/01/2016 1700   LABSPEC 1.010 09/01/2016 1700   PHURINE 7.0 09/01/2016 1700   GLUCOSEU NEGATIVE 09/01/2016 1700   HGBUR NEGATIVE 09/01/2016 1700   BILIRUBINUR NEGATIVE 09/01/2016 1700   KETONESUR NEGATIVE 09/01/2016 1700   PROTEINUR 30 (A) 09/01/2016 1700   UROBILINOGEN 0.2 10/04/2014 2223   NITRITE NEGATIVE 09/01/2016 1700   LEUKOCYTESUR NEGATIVE 09/01/2016 1700   Sepsis Labs Invalid input(s): PROCALCITONIN,  WBC,  LACTICIDVEN Microbiology No results found for this or any previous visit (from the past 240 hour(s)).   Time coordinating discharge: 40 minutes  SIGNED:  Dessa Phi, DO [facility] Pager [phone]  If 7PM-7AM, please contact night-coverage [email or web address] Password [facility] 09/04/2016, 10:17 AM

## 2016-12-29 ENCOUNTER — Emergency Department (HOSPITAL_COMMUNITY): Payer: Medicare Other

## 2016-12-29 ENCOUNTER — Encounter (HOSPITAL_COMMUNITY): Payer: Self-pay

## 2016-12-29 ENCOUNTER — Inpatient Hospital Stay (HOSPITAL_COMMUNITY)
Admission: EM | Admit: 2016-12-29 | Discharge: 2017-01-01 | DRG: 390 | Disposition: A | Discharge status: Home / Self Care | Payer: Medicare Other | Attending: Family Medicine | Admitting: Family Medicine

## 2016-12-29 DIAGNOSIS — Z9049 Acquired absence of other specified parts of digestive tract: Secondary | ICD-10-CM

## 2016-12-29 DIAGNOSIS — Z87891 Personal history of nicotine dependence: Secondary | ICD-10-CM

## 2016-12-29 DIAGNOSIS — Z79891 Long term (current) use of opiate analgesic: Secondary | ICD-10-CM

## 2016-12-29 DIAGNOSIS — K5651 Intestinal adhesions [bands], with partial obstruction: Secondary | ICD-10-CM | POA: Diagnosis not present

## 2016-12-29 DIAGNOSIS — K56609 Unspecified intestinal obstruction, unspecified as to partial versus complete obstruction: Secondary | ICD-10-CM | POA: Diagnosis not present

## 2016-12-29 DIAGNOSIS — R000 Tachycardia, unspecified: Secondary | ICD-10-CM | POA: Diagnosis not present

## 2016-12-29 DIAGNOSIS — R030 Elevated blood-pressure reading, without diagnosis of hypertension: Secondary | ICD-10-CM | POA: Diagnosis present

## 2016-12-29 DIAGNOSIS — G8929 Other chronic pain: Secondary | ICD-10-CM | POA: Diagnosis present

## 2016-12-29 DIAGNOSIS — M549 Dorsalgia, unspecified: Secondary | ICD-10-CM | POA: Diagnosis present

## 2016-12-29 DIAGNOSIS — Z79899 Other long term (current) drug therapy: Secondary | ICD-10-CM

## 2016-12-29 DIAGNOSIS — Z85828 Personal history of other malignant neoplasm of skin: Secondary | ICD-10-CM

## 2016-12-29 DIAGNOSIS — K219 Gastro-esophageal reflux disease without esophagitis: Secondary | ICD-10-CM | POA: Diagnosis present

## 2016-12-29 HISTORY — DX: Incisional hernia without obstruction or gangrene: K43.2

## 2016-12-29 HISTORY — DX: Dizziness and giddiness: R42

## 2016-12-29 HISTORY — DX: Syncope and collapse: R55

## 2016-12-29 LAB — URINALYSIS, ROUTINE W REFLEX MICROSCOPIC
Bilirubin Urine: NEGATIVE
Glucose, UA: NEGATIVE mg/dL
Hgb urine dipstick: NEGATIVE
Ketones, ur: NEGATIVE mg/dL
LEUKOCYTES UA: NEGATIVE
Nitrite: NEGATIVE
PROTEIN: NEGATIVE mg/dL
Specific Gravity, Urine: 1.015 (ref 1.005–1.030)
pH: 6 (ref 5.0–8.0)

## 2016-12-29 LAB — I-STAT CG4 LACTIC ACID, ED: LACTIC ACID, VENOUS: 1.89 mmol/L (ref 0.5–1.9)

## 2016-12-29 LAB — COMPREHENSIVE METABOLIC PANEL
ALK PHOS: 91 U/L (ref 38–126)
ALT: 30 U/L (ref 17–63)
AST: 23 U/L (ref 15–41)
Albumin: 4.5 g/dL (ref 3.5–5.0)
Anion gap: 8 (ref 5–15)
BUN: 11 mg/dL (ref 6–20)
CALCIUM: 10.2 mg/dL (ref 8.9–10.3)
CHLORIDE: 97 mmol/L — AB (ref 101–111)
CO2: 30 mmol/L (ref 22–32)
CREATININE: 1.12 mg/dL (ref 0.61–1.24)
GFR calc Af Amer: >60 mL/min (ref >60)
Glucose, Bld: 179 mg/dL — ABNORMAL HIGH (ref 65–99)
Potassium: 4.2 mmol/L (ref 3.5–5.1)
Sodium: 135 mmol/L (ref 135–145)
Total Bilirubin: 0.9 mg/dL (ref 0.3–1.2)
Total Protein: 8.1 g/dL (ref 6.5–8.1)

## 2016-12-29 LAB — CBC
HCT: 40.2 % (ref 39.0–52.0)
Hemoglobin: 13.8 g/dL (ref 13.0–17.0)
MCH: 29.2 pg (ref 26.0–34.0)
MCHC: 34.3 g/dL (ref 30.0–36.0)
MCV: 85.2 fL (ref 78.0–100.0)
PLATELETS: 244 10*3/uL (ref 150–400)
RBC: 4.72 MIL/uL (ref 4.22–5.81)
RDW: 12.9 % (ref 11.5–15.5)
WBC: 9.5 10*3/uL (ref 4.0–10.5)

## 2016-12-29 LAB — LIPASE, BLOOD: LIPASE: 23 U/L (ref 11–51)

## 2016-12-29 MED ORDER — ONDANSETRON HCL 4 MG/2ML IJ SOLN
4.0000 mg | Freq: Once | INTRAMUSCULAR | Status: AC
  Administered 2016-12-29: 4 mg via INTRAVENOUS
  Filled 2016-12-29: qty 2

## 2016-12-29 MED ORDER — ONDANSETRON 4 MG PO TBDP
4.0000 mg | ORAL_TABLET | Freq: Once | ORAL | Status: DC | PRN
  Filled 2016-12-29: qty 1

## 2016-12-29 MED ORDER — HYDROMORPHONE HCL 1 MG/ML IJ SOLN
0.5000 mg | Freq: Once | INTRAMUSCULAR | Status: AC
  Administered 2016-12-29: 0.5 mg via INTRAVENOUS
  Filled 2016-12-29: qty 0.5

## 2016-12-29 MED ORDER — SODIUM CHLORIDE 0.9 % IV SOLN
INTRAVENOUS | Status: DC
  Administered 2016-12-29: 18:00:00 via INTRAVENOUS

## 2016-12-29 MED ORDER — IOPAMIDOL (ISOVUE-300) INJECTION 61%
INTRAVENOUS | Status: AC
  Administered 2016-12-29: 100 mL
  Filled 2016-12-29: qty 100

## 2016-12-29 MED ORDER — HYDROMORPHONE HCL 1 MG/ML IJ SOLN: 1.0000 mg | Freq: Once | INTRAMUSCULAR | Status: DC

## 2016-12-29 MED ORDER — SODIUM CHLORIDE 0.9 % IV BOLUS (SEPSIS)
250.0000 mL | Freq: Once | INTRAVENOUS | Status: AC
Start: 2016-12-29 — End: 2016-12-29
  Administered 2016-12-29: 250 mL via INTRAVENOUS

## 2016-12-29 NOTE — ED Provider Notes (Signed)
[facility] DEPT Provider Note   CSN: [id number] Arrival date & time: 12/29/16  1031     History   Chief Complaint Chief Complaint  Patient presents with  . Small Bowel Obstruction    HPI Kent [name] is a 76 y.o. male.  Patient with symptoms consistent with previous small bowel obstruction starting about 4 this morning. With periumbilical abdominal pain. Last admitted for small bowel obstruction March 18. Also in the past patient had umbilical hernia repair with mesh. This far as he knows it has not reoccurred. Associated with nausea but no vomiting. Patient in March was admitted by hospitalist team. Patient's also been consult on by Dr. Grandville Silos in the past. Dr. Grandville Silos isn't operated on him for lysis of adhesions secondary small bowel obstruction in the past as well.      Past Medical History:  Diagnosis Date  . 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 Cancer ([place])    basal cell cancer - skin  . Chronic back pain    s/p back surgery  . GERD (gastroesophageal reflux disease)   . GERD (gastroesophageal reflux disease) 08/18/2013  . Incisional hernia   . Sinus infection    currently on antibiotics  . Small bowel obstruction due to adhesions ([place])   . Small bowel obstruction due to adhesions [facility])     Patient Active Problem List   Diagnosis Date Noted  . Dehydration 09/01/2016  . Postural dizziness with presyncope 09/01/2016  . Leukocytosis 09/01/2016  . Nonspecific abnormal electrocardiogram (ECG) (EKG) 09/01/2016  . History of colectomy 02/13/2016  . Vomiting 02/13/2016  . Intractable abdominal pain 02/13/2016  . S/P laparoscopic cholecystectomy 10/16/2014  . S/P hernia repair 04/13/2014  . Pre-op evaluation 03/24/2014  . H/O abdominal surgery 08/18/2013  . GERD (gastroesophageal reflux disease) 08/18/2013  . Chronic back pain 08/18/2013  . Sinus infection 08/18/2013  . SBO (small bowel obstruction) ([place]) 08/12/2013  . Incisional hernia, without obstruction or  gangrene 02/02/2013    Past Surgical History:  Procedure Laterality Date  . BACK SURGERY  08/25/2001   Neck 3 disk replacements  . BACK SURGERY     Placement/removal of nerve stimulator  . CHOLECYSTECTOMY N/A 10/16/2014   Procedure: LAPAROSCOPIC CHOLECYSTECTOMY WITH ATTEMPTED CHOLANGIOGRAM;  Surgeon: Georganna Skeans, MD;  Location: [place];  Service: General;  Laterality: N/A;  . COLON SURGERY  04/2002   Dr. Deon Pilling - Subtotal colectomy, ileostomy  . HERNIA REPAIR    . Ileostomy Takedown  08/2002   Dr. Deon Pilling  . INCISION AND DRAINAGE FOOT    . INCISIONAL HERNIA REPAIR N/A 01/20/2013   Procedure: HERNIA REPAIR INCISIONAL;  Surgeon: Zenovia Jarred, MD;  Location: [place];  Service: General;  Laterality: N/A;  . [place]  04/13/2014   with mesh    dr Grandville Silos  . INCISIONAL HERNIA REPAIR N/A 04/13/2014   Procedure: OPEN REPAIR INCISIONAL HERNIA ;  Surgeon: Georganna Skeans, MD;  Location: [place];  Service: General;  Laterality: N/A;  . INSERTION OF MESH N/A 04/13/2014   Procedure: INSERTION OF MESH;  Surgeon: Georganna Skeans, MD;  Location: [facility];  Service: General;  Laterality: N/A;  . KNEE ARTHROSCOPY Right 2010  . LAPAROSCOPIC CHOLECYSTECTOMY  10/16/2014  . LAPAROTOMY N/A 01/20/2013   Procedure: EXPLORATORY LAPAROTOMY;  Surgeon: Zenovia Jarred, MD;  Location: [facility];  Service: General;  Laterality: N/A;  . Laparotomy with lysis of adhesions  2010   Dr. Grandville Silos - SBO  .  LYSIS OF ADHESION N/A 01/20/2013   Procedure: LYSIS OF ADHESION;  Surgeon: Zenovia Jarred, MD;  Location: [place];  Service: General;  Laterality: N/A;  . microsurgical  03/24/2002   Lateral recess decompression L5  . SACRAL NERVE STIMULATOR PLACEMENT     inserted and removed  . SHOULDER ARTHROSCOPY Bilateral   . VASECTOMY         Home Medications    Prior to Admission medications   Medication Sig Start Date End Date Taking? Authorizing Provider  Ascorbic Acid (VITAMIN C PO) Take 1 tablet by mouth daily.    Yes [provider]  HYDROcodone-acetaminophen (NORCO) 10-325 MG tablet Take 1 tablet by mouth every 6 (six) hours as needed for moderate pain.   Yes [provider]  lidocaine (LIDODERM) 5 % Place 1 patch onto the skin daily as needed (for pain). Remove & Discard patch within 12 hours or as directed by MD   Yes [provider]  Loratadine-Pseudoephedrine (CLARITIN-D 24 HOUR PO) Take 1 tablet by mouth daily.   Yes [provider]  methocarbamol (ROBAXIN) 500 MG tablet Take 1,000 mg by mouth 2 (two) times daily.    Yes [provider]  omeprazole (PRILOSEC) 20 MG capsule Take 20 mg by mouth daily. 02/03/16  Yes [provider]  oxyCODONE (OXYCONTIN) 20 MG 12 hr tablet Take 20 mg by mouth every 12 (twelve) hours.   Yes [provider]  predniSONE (STERAPRED UNI-PAK 21 TAB) 5 MG (21) TBPK tablet Take by mouth daily.   Yes [provider]  Probiotic Product (PROBIOTIC PO) Take 1 tablet by mouth daily.   Yes [provider]  vitamin E 400 UNIT capsule Take 400 Units by mouth daily.   Yes [provider]    Family History Family History  Problem Relation Age of Onset  . Heart disease Mother   . Brain cancer Father     Social History Social History  Substance Use Topics  . Smoking status: Former Smoker    Quit date: 10/09/1979  . Smokeless tobacco: Former Systems developer    Types: Chew  . Alcohol use No     Allergies   Patient has no known allergies.   Review of Systems Review of Systems  Constitutional: Negative for fever.  HENT: Negative for congestion.   Eyes: Negative for visual disturbance.  Respiratory: Negative for shortness of breath.   Cardiovascular: Negative for chest pain.  Gastrointestinal: Positive for abdominal pain and nausea.  Genitourinary: Negative for dysuria.  Musculoskeletal: Negative for back pain.  Neurological: Negative for headaches.  Hematological: Does not bruise/bleed  easily.  Psychiatric/Behavioral: Negative for confusion.     Physical Exam Updated Vital Signs BP (!) 151/95   Pulse 92   Temp 98.5 F (36.9 C) (Oral)   Resp 17   Ht 1.803 m (5\' 11" )   Wt 72.6 kg (160 lb)   SpO2 99%   BMI 22.32 kg/m   Physical Exam  Constitutional: He is oriented to person, place, and time. He appears well-developed and well-nourished. No distress.  HENT:  Head: Normocephalic and atraumatic.  Mouth/Throat: Oropharynx is clear and moist.  Eyes: Conjunctivae and EOM are normal. Pupils are equal, round, and reactive to light.  Neck: Normal range of motion. Neck supple.  Cardiovascular: Normal rate and regular rhythm.   Pulmonary/Chest: Effort normal and breath sounds normal. No respiratory distress.  Abdominal: Soft. Bowel sounds are normal. There is tenderness.  Musculoskeletal: Normal range of motion.  Neurological: He is alert and oriented to person, place, and time. No cranial nerve deficit or sensory deficit. He exhibits normal muscle tone. Coordination normal.  Skin: Skin is warm.  Nursing note and vitals reviewed.    ED Treatments / Results  Labs (all labs ordered are listed, but only abnormal results are displayed) Labs Reviewed  COMPREHENSIVE METABOLIC PANEL - Abnormal; Notable for the following:       Result Value   Chloride 97 (*)    Glucose, Bld 179 (*)    All other components within normal limits  LIPASE, BLOOD  CBC  URINALYSIS, ROUTINE W REFLEX MICROSCOPIC  I-STAT CG4 LACTIC ACID, ED  I-STAT CG4 LACTIC ACID, ED    EKG  EKG Interpretation None       Radiology Ct Abdomen Pelvis W Contrast  Result Date: 12/29/2016 CLINICAL DATA:  Abdominal pain with diarrhea EXAM: CT ABDOMEN AND PELVIS WITH CONTRAST TECHNIQUE: Multidetector CT imaging of the abdomen and pelvis was performed using the standard protocol following bolus administration of intravenous contrast. CONTRAST:  168mL ISOVUE-300 IOPAMIDOL (ISOVUE-300) INJECTION 61% COMPARISON:   Radiograph 09/02/2016, CT 09/01/2016, 03/16/2015 FINDINGS: Lower chest: Lung bases demonstrate a stable posterior left lower lobe lung nodule. No acute consolidation or pleural effusion. Normal heart size. Hepatobiliary: Calcified granuloma. Minimal intra hepatic biliary dilatation as before, surgical clips in the gallbladder fossa. Enlarged extrahepatic bile ducts also unchanged. Pancreas: Unremarkable. No pancreatic ductal dilatation or surrounding inflammatory changes. Spleen: Normal in size without focal abnormality. Adrenals/Urinary Tract: Adrenal glands are unremarkable. Kidneys are normal, without renal calculi, focal lesion, or hydronephrosis. Bladder is unremarkable. Stomach/Bowel: The stomach is nonenlarged. Status post subtotal colectomy. Dilated loops of fluid-filled small bowel within the central abdomen and the pelvis, measuring up to 5 cm. Fecalized segment of dilated distal small bowel in the pelvis with transition point suspected on coronal views in the low pelvis, series 5, image number 62. Small bowel distal to this is decompressed. No significant bowel wall thickening. Vascular/Lymphatic: Aortic atherosclerosis. No enlarged abdominal or pelvic lymph nodes. Reproductive: Prostate calcifications. Other: No free air.  Trace free fluid in the pelvis. Musculoskeletal: No acute or significant osseous findings. IMPRESSION: 1. Status post subtotal colectomy. Multiple loops of fluid-filled dilated mid to distal small bowel in the central abdomen and pelvis. There is a fecalized segment of dilated distal small bowel in the pelvis with transition point suspected in the central lower pelvis, the findings are suspicious for small bowel obstruction either due to stricture or adhesion. 2. Trace free fluid in the pelvis. 3. Stable mild intra and extrahepatic biliary dilatation, likely related to post cholecystectomy changes. Electronically Signed   By: Donavan Foil M.D.   On: 12/29/2016 20:24     Procedures Procedures (including critical care time)  Medications Ordered in ED Medications  ondansetron (ZOFRAN-ODT) disintegrating tablet 4 mg (not administered)  0.9 %  sodium chloride infusion ( Intravenous New Bag/Given 12/29/16 1825)  sodium chloride 0.9 % bolus 250 mL (0 mLs Intravenous Stopped 12/29/16 1920)  ondansetron (ZOFRAN) injection 4 mg (4 mg Intravenous Given 12/29/16 1823)  HYDROmorphone (DILAUDID) injection 0.5 mg (0.5 mg Intravenous Given 12/29/16 1824)  iopamidol (ISOVUE-300) 61 % injection (100 mLs  Contrast Given 12/29/16 1948)  HYDROmorphone (DILAUDID) injection 0.5 mg (0.5 mg Intravenous Given 12/29/16 2143)     Initial Impression / Assessment and Plan / ED Course  I have reviewed the triage vital signs and the nursing notes.  Pertinent labs & imaging results that were available  during my care of the patient were reviewed by me and considered in my medical decision making (see chart for details).     Patient followed by equal physicians. Also has been followed by Dr. Grandville Silos from [facility] surgery. Patient's had several episodes of small bowel obstructions. Has required surgery in the past for lysis of adhesions. But other times it has resolved with bowel rest.  Patient with symptoms consistent with bowel obstruction starting about 3 this morning. Pain is predominantly periumbilical. Associated with nausea but no vomiting. Patient last admitted for same in March 18. Patient known to have an umbilical hernia that was repaired with mesh, apparently not recurrent.  CT scan confirms at least partial small bowel obstruction. An suggestive of complete small bowel obstruction. We'll discuss with hospitalist team. Patient would like for Dr. Grandville Silos to consult on him in the morning.  Patient here nontoxic no acute distress.    Final Clinical Impressions(s) / ED Diagnoses   Final diagnoses:  SBO (small bowel obstruction) ([place])    New Prescriptions New  Prescriptions   No medications on file     Fredia Sorrow, MD 12/29/16 2226

## 2016-12-29 NOTE — ED Triage Notes (Signed)
Pt endorses sharp mid abd pain since 0300 this morning. Pt has hx of SBO and states this feels the same. Pt was told that Dr Grandville Silos would repair SBO if reoccurrence. VSS.

## 2016-12-30 ENCOUNTER — Encounter (HOSPITAL_COMMUNITY): Payer: Self-pay | Admitting: Emergency Medicine

## 2016-12-30 ENCOUNTER — Inpatient Hospital Stay (HOSPITAL_COMMUNITY): Payer: Medicare Other

## 2016-12-30 DIAGNOSIS — K5651 Intestinal adhesions [bands], with partial obstruction: Secondary | ICD-10-CM | POA: Diagnosis present

## 2016-12-30 DIAGNOSIS — K219 Gastro-esophageal reflux disease without esophagitis: Secondary | ICD-10-CM | POA: Diagnosis present

## 2016-12-30 DIAGNOSIS — R030 Elevated blood-pressure reading, without diagnosis of hypertension: Secondary | ICD-10-CM | POA: Diagnosis not present

## 2016-12-30 DIAGNOSIS — M549 Dorsalgia, unspecified: Secondary | ICD-10-CM | POA: Diagnosis present

## 2016-12-30 DIAGNOSIS — G8929 Other chronic pain: Secondary | ICD-10-CM | POA: Diagnosis present

## 2016-12-30 DIAGNOSIS — R000 Tachycardia, unspecified: Secondary | ICD-10-CM | POA: Diagnosis not present

## 2016-12-30 DIAGNOSIS — Z85828 Personal history of other malignant neoplasm of skin: Secondary | ICD-10-CM | POA: Diagnosis not present

## 2016-12-30 DIAGNOSIS — Z79899 Other long term (current) drug therapy: Secondary | ICD-10-CM | POA: Diagnosis not present

## 2016-12-30 DIAGNOSIS — Z87891 Personal history of nicotine dependence: Secondary | ICD-10-CM | POA: Diagnosis not present

## 2016-12-30 DIAGNOSIS — Z9049 Acquired absence of other specified parts of digestive tract: Secondary | ICD-10-CM | POA: Diagnosis not present

## 2016-12-30 DIAGNOSIS — K56609 Unspecified intestinal obstruction, unspecified as to partial versus complete obstruction: Secondary | ICD-10-CM | POA: Diagnosis not present

## 2016-12-30 DIAGNOSIS — Z79891 Long term (current) use of opiate analgesic: Secondary | ICD-10-CM | POA: Diagnosis not present

## 2016-12-30 LAB — BASIC METABOLIC PANEL
ANION GAP: 10 (ref 5–15)
BUN: 14 mg/dL (ref 6–20)
CO2: 26 mmol/L (ref 22–32)
Calcium: 9.6 mg/dL (ref 8.9–10.3)
Chloride: 101 mmol/L (ref 101–111)
Creatinine, Ser: 0.98 mg/dL (ref 0.61–1.24)
GFR calc Af Amer: >60 mL/min (ref >60)
GFR calc non Af Amer: >60 mL/min (ref >60)
GLUCOSE: 114 mg/dL — AB (ref 65–99)
POTASSIUM: 3.7 mmol/L (ref 3.5–5.1)
Sodium: 137 mmol/L (ref 135–145)

## 2016-12-30 LAB — CBC
HEMATOCRIT: 39.8 % (ref 39.0–52.0)
Hemoglobin: 13.4 g/dL (ref 13.0–17.0)
MCH: 29.1 pg (ref 26.0–34.0)
MCHC: 33.7 g/dL (ref 30.0–36.0)
MCV: 86.3 fL (ref 78.0–100.0)
Platelets: 210 10*3/uL (ref 150–400)
RBC: 4.61 MIL/uL (ref 4.22–5.81)
RDW: 13.1 % (ref 11.5–15.5)
WBC: 7.7 10*3/uL (ref 4.0–10.5)

## 2016-12-30 MED ORDER — ONDANSETRON HCL 4 MG/2ML IJ SOLN: 4.0000 mg | Freq: Four times a day (QID) | INTRAMUSCULAR | Status: DC | PRN

## 2016-12-30 MED ORDER — ENOXAPARIN SODIUM 40 MG/0.4ML ~~LOC~~ SOLN
40.0000 mg | Freq: Every day | SUBCUTANEOUS | Status: DC
  Administered 2016-12-30 – 2017-01-01 (×3): 40 mg via SUBCUTANEOUS
  Filled 2016-12-30 (×3): qty 0.4

## 2016-12-30 MED ORDER — METHOCARBAMOL 500 MG PO TABS: 1000.0000 mg | ORAL_TABLET | Freq: Two times a day (BID) | ORAL | Status: DC

## 2016-12-30 MED ORDER — METHOCARBAMOL 500 MG PO TABS
1000.0000 mg | ORAL_TABLET | Freq: Two times a day (BID) | ORAL | Status: DC
  Administered 2016-12-30 – 2017-01-01 (×6): 1000 mg via ORAL
  Filled 2016-12-30 (×7): qty 2

## 2016-12-30 MED ORDER — SODIUM CHLORIDE 0.9 % IV SOLN
INTRAVENOUS | Status: DC
  Administered 2016-12-30 – 2017-01-01 (×5): via INTRAVENOUS

## 2016-12-30 MED ORDER — ONDANSETRON HCL 4 MG/2ML IJ SOLN
4.0000 mg | Freq: Four times a day (QID) | INTRAMUSCULAR | Status: DC | PRN
  Administered 2016-12-30: 4 mg via INTRAVENOUS
  Filled 2016-12-30: qty 2

## 2016-12-30 MED ORDER — PANTOPRAZOLE SODIUM 40 MG PO TBEC
40.0000 mg | DELAYED_RELEASE_TABLET | Freq: Every day | ORAL | Status: DC
  Administered 2016-12-30 – 2017-01-01 (×3): 40 mg via ORAL
  Filled 2016-12-30 (×3): qty 1

## 2016-12-30 MED ORDER — HYDRALAZINE HCL 20 MG/ML IJ SOLN
5.0000 mg | INTRAMUSCULAR | Status: DC | PRN
  Administered 2016-12-30: 5 mg via INTRAVENOUS
  Filled 2016-12-30: qty 1

## 2016-12-30 MED ORDER — HYDROMORPHONE HCL 1 MG/ML IJ SOLN
0.5000 mg | INTRAMUSCULAR | Status: DC | PRN
  Administered 2016-12-30 – 2017-01-01 (×9): 0.5 mg via INTRAVENOUS
  Filled 2016-12-30: qty 0.5
  Filled 2016-12-30 (×9): qty 1

## 2016-12-30 NOTE — Progress Notes (Signed)
[facility] Progress Note     Subjective: CC:  Had 2 watery BMs that were non-bloody. Denies nausea or vomiting. Abdominal pain present but improved - located just below the navel.  Objective: Vital signs in last 24 hours: Temp:  [97.6 F (36.4 C)-98.9 F (37.2 C)] 98.4 F (36.9 C) (07/10 0225) Pulse Rate:  [68-114] 89 (07/10 0430) Resp:  [12-18] 16 (07/10 0225) BP: (109-196)/(68-101) 153/70 (07/10 0552) SpO2:  [93 %-100 %] 97 % (07/10 0225) Weight:  [72.6 kg (160 lb)] 72.6 kg (160 lb) (07/09 1108) Last BM Date: 12/30/16  Intake/Output from previous day: 07/09 0701 - 07/10 0700 In: 1153.8 [I.V.:903.8; IV Piggyback:250] Out: 200 [Urine:200] Intake/Output this shift: Total I/O In: 0  Out: 2 [Urine:1; Stool:1]  PE: Gen:  Alert, NAD, pleasant Card:  Regular rate and rhythm, pedal pulses 2+ BL Pulm:  Normal effort, clear to auscultation bilaterally Abd: Soft, TTP peri-umbilical and subrapubic region, mild distention, bowel sounds present in all 4 quadrants, no HSM, incisions C/D/I Skin: warm and dry, no rashes  Psych: A&Ox3   Lab Results:   Recent Labs  12/29/16 1112 12/30/16 0444  WBC 9.5 7.7  HGB 13.8 13.4  HCT 40.2 39.8  PLT 244 210   BMET  Recent Labs  12/29/16 1112 12/30/16 0444  NA 135 137  K 4.2 3.7  CL 97* 101  CO2 30 26  GLUCOSE 179* 114*  BUN 11 14  CREATININE 1.12 0.98  CALCIUM 10.2 9.6   CMP     Component Value Date/Time   NA 137 12/30/2016 0444   K 3.7 12/30/2016 0444   CL 101 12/30/2016 0444   CO2 26 12/30/2016 0444   GLUCOSE 114 (H) 12/30/2016 0444   BUN 14 12/30/2016 0444   CREATININE 0.98 12/30/2016 0444   CALCIUM 9.6 12/30/2016 0444   PROT 8.1 12/29/2016 1112   ALBUMIN 4.5 12/29/2016 1112   AST 23 12/29/2016 1112   ALT 30 12/29/2016 1112   ALKPHOS 91 12/29/2016 1112   BILITOT 0.9 12/29/2016 1112   GFRNONAA >60 12/30/2016 0444   GFRAA >60 12/30/2016 0444   Lipase     Component Value Date/Time   LIPASE 23  12/29/2016 1112   Studies/Results: Ct Abdomen Pelvis W Contrast  Result Date: 12/29/2016 CLINICAL DATA:  Abdominal pain with diarrhea EXAM: CT ABDOMEN AND PELVIS WITH CONTRAST TECHNIQUE: Multidetector CT imaging of the abdomen and pelvis was performed using the standard protocol following bolus administration of intravenous contrast. CONTRAST:  164mL ISOVUE-300 IOPAMIDOL (ISOVUE-300) INJECTION 61% COMPARISON:  Radiograph 09/02/2016, CT 09/01/2016, 03/16/2015 FINDINGS: Lower chest: Lung bases demonstrate a stable posterior left lower lobe lung nodule. No acute consolidation or pleural effusion. Normal heart size. Hepatobiliary: Calcified granuloma. Minimal intra hepatic biliary dilatation as before, surgical clips in the gallbladder fossa. Enlarged extrahepatic bile ducts also unchanged. Pancreas: Unremarkable. No pancreatic ductal dilatation or surrounding inflammatory changes. Spleen: Normal in size without focal abnormality. Adrenals/Urinary Tract: Adrenal glands are unremarkable. Kidneys are normal, without renal calculi, focal lesion, or hydronephrosis. Bladder is unremarkable. Stomach/Bowel: The stomach is nonenlarged. Status post subtotal colectomy. Dilated loops of fluid-filled small bowel within the central abdomen and the pelvis, measuring up to 5 cm. Fecalized segment of dilated distal small bowel in the pelvis with transition point suspected on coronal views in the low pelvis, series 5, image number 62. Small bowel distal to this is decompressed. No significant bowel wall thickening. Vascular/Lymphatic: Aortic atherosclerosis. No enlarged abdominal or pelvic lymph nodes. Reproductive: Prostate  calcifications. Other: No free air.  Trace free fluid in the pelvis. Musculoskeletal: No acute or significant osseous findings. IMPRESSION: 1. Status post subtotal colectomy. Multiple loops of fluid-filled dilated mid to distal small bowel in the central abdomen and pelvis. There is a fecalized segment of  dilated distal small bowel in the pelvis with transition point suspected in the central lower pelvis, the findings are suspicious for small bowel obstruction either due to stricture or adhesion. 2. Trace free fluid in the pelvis. 3. Stable mild intra and extrahepatic biliary dilatation, likely related to post cholecystectomy changes. Electronically Signed   By: Donavan Foil M.D.   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 - refused NG tube at presentation, repeat abdominal films pending  - continue bowel rest and IVF, may be able to advance diet after we review abominal films  - pain control, minimize narcotic use as able   GERD- PPI  FEN - NPO, IVF ID- none VTE - SCD's, Lovenox    LOS: 0 days    Jill Alexanders , [facility] 12/30/2016, 10:52 AM Pager: [phone] Consults: [phone] Mon-Fri 7:00 am-4:30 pm Sat-Sun 7:00 am-11:30 am

## 2016-12-30 NOTE — ED Notes (Signed)
Paged admitting to discuss patient's BP

## 2016-12-30 NOTE — H&P (Signed)
History and Physical    [name] TIR:[id number] DOB: March 29, 1941 DOA: 12/29/2016  PCP: Shirline Frees, MD  Patient coming from: Home  I have personally briefly reviewed patient's old medical records in [place]  Chief Complaint: SBO  HPI: Kent [name] is a 76 y.o. 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  Lysis of adhesions in 2010, 2014, hernia repair, (4-5 total abd surgeries).  Patient presents to ED with c/o periumbilical abdominal pain, symptoms c/w prior SBO.  Last admitted for SBO in March this year.  Patient seen by Dr. Grandville Silos in past.   ED Course: Patient with SBO again on CT scan.   Review of Systems: As per HPI otherwise 10 point review of systems negative.   Past Medical History:  Diagnosis Date  . 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 Cancer ([place])    basal cell cancer - skin  . Chronic back pain    s/p back surgery  . GERD (gastroesophageal reflux disease)   . GERD (gastroesophageal reflux disease) 08/18/2013  . Incisional hernia   . Sinus infection    currently on antibiotics  . Small bowel obstruction due to adhesions ([place])   . Small bowel obstruction due to adhesions [facility])     Past Surgical History:  Procedure Laterality Date  . BACK SURGERY  08/25/2001   Neck 3 disk replacements  . BACK SURGERY     Placement/removal of nerve stimulator  . CHOLECYSTECTOMY N/A 10/16/2014   Procedure: LAPAROSCOPIC CHOLECYSTECTOMY WITH ATTEMPTED CHOLANGIOGRAM;  Surgeon: Georganna Skeans, MD;  Location: [place];  Service: General;  Laterality: N/A;  . COLON SURGERY  04/2002   Dr. Deon Pilling - Subtotal colectomy, ileostomy  . HERNIA REPAIR    . Ileostomy Takedown  08/2002   Dr. Deon Pilling  . INCISION AND DRAINAGE FOOT    . INCISIONAL HERNIA REPAIR N/A 01/20/2013   Procedure: HERNIA REPAIR INCISIONAL;  Surgeon: Zenovia Jarred, MD;  Location: [place];  Service: General;  Laterality: N/A;  . [place]  04/13/2014    with mesh    dr Grandville Silos  . INCISIONAL HERNIA REPAIR N/A 04/13/2014   Procedure: OPEN REPAIR INCISIONAL HERNIA ;  Surgeon: Georganna Skeans, MD;  Location: [place];  Service: General;  Laterality: N/A;  . INSERTION OF MESH N/A 04/13/2014   Procedure: INSERTION OF MESH;  Surgeon: Georganna Skeans, MD;  Location: [place];  Service: General;  Laterality: N/A;  . KNEE ARTHROSCOPY Right 2010  . LAPAROSCOPIC CHOLECYSTECTOMY  10/16/2014  . LAPAROTOMY N/A 01/20/2013   Procedure: EXPLORATORY LAPAROTOMY;  Surgeon: Zenovia Jarred, MD;  Location: [place];  Service: General;  Laterality: N/A;  . Laparotomy with lysis of adhesions  2010   Dr. Grandville Silos - SBO  . LYSIS OF ADHESION N/A 01/20/2013   Procedure: LYSIS OF ADHESION;  Surgeon: Zenovia Jarred, MD;  Location: [place];  Service: General;  Laterality: N/A;  . microsurgical  03/24/2002   Lateral recess decompression L5  . SACRAL NERVE STIMULATOR PLACEMENT     inserted and removed  . SHOULDER ARTHROSCOPY Bilateral   . VASECTOMY       reports that he quit smoking about 37 years ago. He has quit using smokeless tobacco. His smokeless tobacco use included Chew. He reports that he does not drink alcohol or use drugs.  No Known Allergies  Family History  Problem Relation Age of Onset  . Heart disease  Mother   . Brain cancer Father      Prior to Admission medications   Medication Sig Start Date End Date Taking? Authorizing Provider  Ascorbic Acid (VITAMIN C PO) Take 1 tablet by mouth daily.   Yes [provider]  HYDROcodone-acetaminophen (NORCO) 10-325 MG tablet Take 1 tablet by mouth every 6 (six) hours as needed for moderate pain.   Yes [provider]  lidocaine (LIDODERM) 5 % Place 1 patch onto the skin daily as needed (for pain). Remove & Discard patch within 12 hours or as directed by MD   Yes [provider]  Loratadine-Pseudoephedrine (CLARITIN-D 24 HOUR PO) Take 1 tablet by mouth daily.   Yes [provider]    methocarbamol (ROBAXIN) 500 MG tablet Take 1,000 mg by mouth 2 (two) times daily.    Yes [provider]  omeprazole (PRILOSEC) 20 MG capsule Take 20 mg by mouth daily. 02/03/16  Yes [provider]  oxyCODONE (OXYCONTIN) 20 MG 12 hr tablet Take 20 mg by mouth every 12 (twelve) hours.   Yes [provider]  predniSONE (STERAPRED UNI-PAK 21 TAB) 5 MG (21) TBPK tablet Take by mouth daily.   Yes [provider]  Probiotic Product (PROBIOTIC PO) Take 1 tablet by mouth daily.   Yes [provider]  vitamin E 400 UNIT capsule Take 400 Units by mouth daily.   Yes [provider]    Physical Exam: Vitals:   12/29/16 2100 12/29/16 2200 12/29/16 2240 12/30/16 0000  BP: (!) 192/98 (!) 151/95 (!) 155/88 (!) 182/94  Pulse: 86 92 93 87  Resp: 18 17    Temp:      TempSrc:      SpO2: 97% 99% 97% 96%  Weight:      Height:        Constitutional: NAD, calm, comfortable Eyes: PERRL, lids and conjunctivae normal ENMT: Mucous membranes are moist. Posterior pharynx clear of any exudate or lesions.Normal dentition.  Neck: normal, supple, no masses, no thyromegaly Respiratory: clear to auscultation bilaterally, no wheezing, no crackles. Normal respiratory effort. No accessory muscle use.  Cardiovascular: Regular rate and rhythm, no murmurs / rubs / gallops. No extremity edema. 2+ pedal pulses. No carotid bruits.  Abdomen: no tenderness, no masses palpated. No hepatosplenomegaly. Bowel sounds positive.  Musculoskeletal: no clubbing / cyanosis. No joint deformity upper and lower extremities. Good ROM, no contractures. Normal muscle tone.  Skin: no rashes, lesions, ulcers. No induration Neurologic: CN 2-12 grossly intact. Sensation intact, DTR normal. Strength 5/5 in all 4.  Psychiatric: Normal judgment and insight. Alert and oriented x 3. Normal mood.    Labs on Admission: I have personally reviewed following labs and imaging studies  CBC:  Recent  Labs Lab 12/29/16 1112  WBC 9.5  HGB 13.8  HCT 40.2  MCV 85.2  PLT 811   Basic Metabolic Panel:  Recent Labs Lab 12/29/16 1112  NA 135  K 4.2  CL 97*  CO2 30  GLUCOSE 179*  BUN 11  CREATININE 1.12  CALCIUM 10.2   GFR: Estimated Creatinine Clearance: 60.3 mL/min (by C-G formula based on SCr of 1.12 mg/dL). Liver Function Tests:  Recent Labs Lab 12/29/16 1112  AST 23  ALT 30  ALKPHOS 91  BILITOT 0.9  PROT 8.1  ALBUMIN 4.5    Recent Labs Lab 12/29/16 1112  LIPASE 23   No results for input(s): AMMONIA in the last 168 hours. Coagulation Profile: No results for input(s): INR, PROTIME  in the last 168 hours. Cardiac Enzymes: No results for input(s): CKTOTAL, CKMB, CKMBINDEX, TROPONINI in the last 168 hours. BNP (last 3 results) No results for input(s): PROBNP in the last 8760 hours. HbA1C: No results for input(s): HGBA1C in the last 72 hours. CBG: No results for input(s): GLUCAP in the last 168 hours. Lipid Profile: No results for input(s): CHOL, HDL, LDLCALC, TRIG, CHOLHDL, LDLDIRECT in the last 72 hours. Thyroid Function Tests: No results for input(s): TSH, T4TOTAL, FREET4, T3FREE, THYROIDAB in the last 72 hours. Anemia Panel: No results for input(s): VITAMINB12, FOLATE, FERRITIN, TIBC, IRON, RETICCTPCT in the last 72 hours. Urine analysis:    Component Value Date/Time   COLORURINE YELLOW 12/29/2016 1745   APPEARANCEUR CLEAR 12/29/2016 1745   LABSPEC 1.015 12/29/2016 1745   PHURINE 6.0 12/29/2016 1745   GLUCOSEU NEGATIVE 12/29/2016 1745   HGBUR NEGATIVE 12/29/2016 1745   BILIRUBINUR NEGATIVE 12/29/2016 1745   KETONESUR NEGATIVE 12/29/2016 1745   PROTEINUR NEGATIVE 12/29/2016 1745   UROBILINOGEN 0.2 10/04/2014 2223   NITRITE NEGATIVE 12/29/2016 1745   LEUKOCYTESUR NEGATIVE 12/29/2016 1745    Radiological Exams on Admission: Ct Abdomen Pelvis W Contrast  Result Date: 12/29/2016 CLINICAL DATA:  Abdominal pain with diarrhea EXAM: CT ABDOMEN AND  PELVIS WITH CONTRAST TECHNIQUE: Multidetector CT imaging of the abdomen and pelvis was performed using the standard protocol following bolus administration of intravenous contrast. CONTRAST:  165mL ISOVUE-300 IOPAMIDOL (ISOVUE-300) INJECTION 61% COMPARISON:  Radiograph 09/02/2016, CT 09/01/2016, 03/16/2015 FINDINGS: Lower chest: Lung bases demonstrate a stable posterior left lower lobe lung nodule. No acute consolidation or pleural effusion. Normal heart size. Hepatobiliary: Calcified granuloma. Minimal intra hepatic biliary dilatation as before, surgical clips in the gallbladder fossa. Enlarged extrahepatic bile ducts also unchanged. Pancreas: Unremarkable. No pancreatic ductal dilatation or surrounding inflammatory changes. Spleen: Normal in size without focal abnormality. Adrenals/Urinary Tract: Adrenal glands are unremarkable. Kidneys are normal, without renal calculi, focal lesion, or hydronephrosis. Bladder is unremarkable. Stomach/Bowel: The stomach is nonenlarged. Status post subtotal colectomy. Dilated loops of fluid-filled small bowel within the central abdomen and the pelvis, measuring up to 5 cm. Fecalized segment of dilated distal small bowel in the pelvis with transition point suspected on coronal views in the low pelvis, series 5, image number 62. Small bowel distal to this is decompressed. No significant bowel wall thickening. Vascular/Lymphatic: Aortic atherosclerosis. No enlarged abdominal or pelvic lymph nodes. Reproductive: Prostate calcifications. Other: No free air.  Trace free fluid in the pelvis. Musculoskeletal: No acute or significant osseous findings. IMPRESSION: 1. Status post subtotal colectomy. Multiple loops of fluid-filled dilated mid to distal small bowel in the central abdomen and pelvis. There is a fecalized segment of dilated distal small bowel in the pelvis with transition point suspected in the central lower pelvis, the findings are suspicious for small bowel obstruction either  due to stricture or adhesion. 2. Trace free fluid in the pelvis. 3. Stable mild intra and extrahepatic biliary dilatation, likely related to post cholecystectomy changes. Electronically Signed   By: Donavan Foil M.D.   On: 12/29/2016 20:24    EKG: Independently reviewed.  Assessment/Plan Active Problems:   SBO (small bowel obstruction) ([place])    1. SBO -  1. Patient without vomiting, declined NGT at this time 1. Has to go to radiology to get NGT if they need to do it anyhow according to patient and Dr. Donne Hazel who is seeing patient. 2. NPO 3. IVF 4. Dilaudid PRN pain control  DVT prophylaxis: Lovenox Code Status: Full  Family Communication: No family in room Disposition Plan: Home after admit Consults called: Surgery Admission status: Admit to inpatient   Etta Quill DO [facility] Pager [phone]  If 7AM-7PM, please contact day team taking care of patient [email or web address] Password [facility]  12/30/2016, 12:27 AM

## 2016-12-30 NOTE — ED Notes (Signed)
Spoke to MD about patient's pain.  Recommended NG tube placement.  Pt refusing and MD aware.

## 2016-12-30 NOTE — Progress Notes (Signed)
Progress Note    ARCH PLANT  GEX:[id number] DOB: 1940-07-20  DOA: 12/29/2016 PCP: Johny Blamer, MD    Brief Narrative:   Chief complaint: Follow-up abdominal pain secondary to SBO  Medical records reviewed and are as summarized below:  Kent [name] is an 76 y.o. male with PMH of multiple abdominal surgeries including subtotal colectomy with ileostomy followed by ileostomy takedown, repair of ileostomy site hernia, repair of ventral incisional hernia with mesh as well as laparoscopic cholecystectomy who has had problems with recurrent SBO, and who was admitted 12/29/16 with a 24-hour history of abdominal pain and nausea. CT scan showed a recurrent SBO. Surgery has been consulted for further management.  Assessment/Plan:   Principal Problem:   SBO (small bowel obstruction) (HCC) in a patient with a history of multiple bowel surgeries Likely from adhesions. Initially refused NG tube for decompression. Continue bowel rest. Being followed by surgery.Continue conservative care for now with IV fluids, bowel rest. Appears to be resolving clinically.  Active problems   Elevated blood pressure No prior history of hypertension per patient. May be related to acute condition/pain. Hydralazine as needed for now.  Family Communication/Anticipated D/C date and plan/Code Status   DVT prophylaxis: Lovenox ordered. Code Status: Full Code.  Family Communication: No family currently at the bedside. Disposition Plan: Home in the next 24-48 hours if diet can be advanced.  Medical Consultants:    Surgery.   Anti-Infectives:    None  Subjective:   Had 2 loose stools this morning. Some nausea but no vomiting. Feels like abdomen is softer. No current abdominal pain.   Objective:    Vitals:   12/30/16 0225 12/30/16 0329 12/30/16 0430 12/30/16 0552  BP: (!) 196/100 (!) 170/84 (!) 167/86 (!) 153/70  Pulse: 88 82 89   Resp: 16     Temp: 98.4 F (36.9 C)     TempSrc: Oral       SpO2: 97%     Weight:      Height:        Intake/Output Summary (Last 24 hours) at 12/30/16 0907 Last data filed at 12/30/16 0500  Gross per 24 hour  Intake          1153.75 ml  Output              200 ml  Net           953.75 ml   Filed Weights   12/29/16 1108  Weight: 72.6 kg (160 lb)    Exam: General exam: Appears calm and comfortable.  Respiratory system: Clear to auscultation. Respiratory effort normal. Cardiovascular system: S1 & S2 heard, RRR. No JVD,  rubs, gallops or clicks. No murmurs. Gastrointestinal system: Abdomen is nondistended, soft but tender in the periumbilical area. No organomegaly or masses felt. Normal bowel sounds heard. Central nervous system: Alert and oriented. No focal neurological deficits. Extremities: No clubbing,  or cyanosis. No edema. Skin: No rashes, lesions or ulcers. Psychiatry: Judgement and insight appear normal. Mood & affect appropriate.   Labs:   Chemistries show a mildly elevated glucose at 114. Electrolytes and LFTs WNL. Lipase 23. WBC 7.7, hemoglobin 13.4.  Procedures and diagnostic studies    CT of the abdomen/pelvis 12/29/16: Multiple loops of fluid-filled dilated mid to distal small bowel. Finding suspicious for SBO.  2 views of the abdomen 12/30/16: Persistently dilated small bowel loops consistent with partial small bowel obstruction.   Medications:   . enoxaparin (LOVENOX) injection  40 mg Subcutaneous Daily  . methocarbamol  1,000 mg Oral BID  . pantoprazole  40 mg Oral Daily   Continuous Infusions: . sodium chloride 75 mL/hr at 12/29/16 1825  . sodium chloride 100 mL/hr at 12/30/16 0209   Medical decision making is moderately complex therefore this is a level 2 visit. ( >3 data points, moderate risk)   Problems/DDx Points   Self limited or minor (max 2)       1  1  Established problem, stable       1  1  Established problem, worsening       2   New problem, no additional W/U planned (max 1)       3   New  problem, additional W/U planned        4    Data Reviewed Points   Review/order clinical lab tests       1  1  Review/order x-rays       1   Review/order tests (Echo, EKG, PFTs, etc)       1   Discussion of test results w/ performing MD       1   Independent review of image, tracing or specimen       2  2  Decision to obtain old records       1   Review and summation of old records       2  2   Level of risk Presenting prob Diagnostics Management   Minimal 1 self limited/minor Labs CXR EKG/EEG U/A U/S Rest Gargles Bandages Dressings   Low 2 or more self limited/minor 1 stable chronic Acute uncomplicated illness Tests (PFTS) Non-CV imaging Arterial labs Biopsies of skin OTC drugs Minor surgery-no risk PT OT IVF without additives    Moderate 1 or more chronic illnesses w/ mild exac, progression or S/E from tx 2 or more stable chronic illnesses Undiagnosed new problem w/ uncertain prognosis Acute complicated injury  Stress tests Endoscopies with no risk factors Deep needle or incisional bx CV imaging without risk LP Thoracentesis Paracentesis Minor surgery w/ risks Elective major surgery w/ no risk (open, percutaneous or endoscopic) Prescription drugs Therapeutic nucl med IVF with additives Closed tx of fracture/dislocation    High Severe exac of chronic illness Acute or chronic illness/injury may pose a threat to life or bodily function (ARF) Change in neuro status    CV imaging w/ contrast and risk Cardio electophysiologic tests Endoscopies w/ risk Discography Elective major surgery Emergency major surgery Parenteral controlled substances Drug therapy req monitoring for toxicity DNR/de-escalation of care    MDM Prob points Data points Risk   Straightforward    <1    <1    Min   Low complexity    2    2    Low   Moderate    3    3    Mod   High Complexity    4 or more    4 or more    High      LOS: 0 days   Azelie Noguera  [facility] Pager [phone]. If unable to reach me by pager, please call my cell phone at [phone].  *Please refer to [email or web address], password [email or web address] to get updated schedule on who will round on this patient, as hospitalists switch teams weekly. If 7PM-7AM, please contact night-coverage at [email or web address], password [email or web address] for any overnight needs.  12/30/2016, 9:07 AM

## 2016-12-30 NOTE — Consult Note (Signed)
Reason for Consult:sbo Referring Physician: Dr Gaetano Hawthorne is an 76 y.o. male.  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  He has been admitted in august of 2017, march 2018 with sbo.  He returns today after noting onset abdominal pain on Sunday. No flatus or bm last 24 hours. Some nausea no emesis. Was not getting better so came to er.  He underwent ct scan that shows likely sbo and we were consulted.  No fevers/chills.    Past Medical History:  Diagnosis Date  . 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 Cancer ([place])    basal cell cancer - skin  . Chronic back pain    s/p back surgery  . GERD (gastroesophageal reflux disease)   . GERD (gastroesophageal reflux disease) 08/18/2013  . Incisional hernia   . Sinus infection    currently on antibiotics  . 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 Small bowel obstruction due to adhesions [facility])     Past Surgical History:  Procedure Laterality Date  . BACK SURGERY  08/25/2001   Neck 3 disk replacements  . BACK SURGERY     Placement/removal of nerve stimulator  . CHOLECYSTECTOMY N/A 10/16/2014   Procedure: LAPAROSCOPIC CHOLECYSTECTOMY WITH ATTEMPTED CHOLANGIOGRAM;  Surgeon: Georganna Skeans, MD;  Location: [place];  Service: General;  Laterality: N/A;  . COLON SURGERY  04/2002   Dr. Deon Pilling - Subtotal colectomy, ileostomy  . HERNIA REPAIR    . Ileostomy Takedown  08/2002   Dr. Deon Pilling  . INCISION AND DRAINAGE FOOT    . INCISIONAL HERNIA REPAIR N/A 01/20/2013   Procedure: HERNIA REPAIR INCISIONAL;  Surgeon: Zenovia Jarred, MD;  Location: [place];  Service: General;  Laterality: N/A;  . [place]  04/13/2014   with mesh    dr Grandville Silos  . INCISIONAL HERNIA REPAIR N/A 04/13/2014   Procedure: OPEN REPAIR INCISIONAL HERNIA ;  Surgeon: Georganna Skeans, MD;  Location: [place];  Service:  General;  Laterality: N/A;  . INSERTION OF MESH N/A 04/13/2014   Procedure: INSERTION OF MESH;  Surgeon: Georganna Skeans, MD;  Location: [place];  Service: General;  Laterality: N/A;  . KNEE ARTHROSCOPY Right 2010  . LAPAROSCOPIC CHOLECYSTECTOMY  10/16/2014  . LAPAROTOMY N/A 01/20/2013   Procedure: EXPLORATORY LAPAROTOMY;  Surgeon: Zenovia Jarred, MD;  Location: [place];  Service: General;  Laterality: N/A;  . Laparotomy with lysis of adhesions  2010   Dr. Grandville Silos - SBO  . LYSIS OF ADHESION N/A 01/20/2013   Procedure: LYSIS OF ADHESION;  Surgeon: Zenovia Jarred, MD;  Location: [place];  Service: General;  Laterality: N/A;  . microsurgical  03/24/2002   Lateral recess decompression L5  . SACRAL NERVE STIMULATOR PLACEMENT     inserted and removed  . SHOULDER ARTHROSCOPY Bilateral   . VASECTOMY      Family History  Problem Relation Age of Onset  . Heart disease Mother   . Brain cancer Father     Social History:  reports that he quit smoking about 37 years ago. He has quit using smokeless tobacco. His smokeless tobacco use included Chew. He reports that he does not drink alcohol or use drugs.  Allergies: No Known Allergies  Medications: I have reviewed the patient's current medications.  Results for orders  placed or performed during the hospital encounter of 12/29/16 (from the past 48 hour(s))  Lipase, blood     Status: None   Collection Time: 12/29/16 11:12 AM  Result Value Ref Range   Lipase 23 11 - 51 U/L  Comprehensive metabolic panel     Status: Abnormal   Collection Time: 12/29/16 11:12 AM  Result Value Ref Range   Sodium 135 135 - 145 mmol/L   Potassium 4.2 3.5 - 5.1 mmol/L   Chloride 97 (L) 101 - 111 mmol/L   CO2 30 22 - 32 mmol/L   Glucose, Bld 179 (H) 65 - 99 mg/dL   BUN 11 6 - 20 mg/dL   Creatinine, Ser 1.12 0.61 - 1.24 mg/dL   Calcium 10.2 8.9 - 10.3 mg/dL   Total Protein 8.1 6.5 - 8.1 g/dL   Albumin 4.5 3.5 - 5.0 g/dL   AST 23 15 - 41 U/L   ALT 30 17 - 63 U/L    Alkaline Phosphatase 91 38 - 126 U/L   Total Bilirubin 0.9 0.3 - 1.2 mg/dL   GFR calc non Af Amer >60 >60 mL/min   GFR calc Af Amer >60 >60 mL/min    Comment: (NOTE) The eGFR has been calculated using the CKD EPI equation. This calculation has not been validated in all clinical situations. eGFR's persistently <60 mL/min signify possible Chronic Kidney Disease.    Anion gap 8 5 - 15  CBC     Status: None   Collection Time: 12/29/16 11:12 AM  Result Value Ref Range   WBC 9.5 4.0 - 10.5 K/uL   RBC 4.72 4.22 - 5.81 MIL/uL   Hemoglobin 13.8 13.0 - 17.0 g/dL   HCT 40.2 39.0 - 52.0 %   MCV 85.2 78.0 - 100.0 fL   MCH 29.2 26.0 - 34.0 pg   MCHC 34.3 30.0 - 36.0 g/dL   RDW 12.9 11.5 - 15.5 %   Platelets 244 150 - 400 K/uL  I-Stat CG4 Lactic Acid, ED     Status: None   Collection Time: 12/29/16 11:36 AM  Result Value Ref Range   Lactic Acid, Venous 1.89 0.5 - 1.9 mmol/L  Urinalysis, Routine w reflex microscopic     Status: None   Collection Time: 12/29/16  5:45 PM  Result Value Ref Range   Color, Urine YELLOW YELLOW   APPearance CLEAR CLEAR   Specific Gravity, Urine 1.015 1.005 - 1.030   pH 6.0 5.0 - 8.0   Glucose, UA NEGATIVE NEGATIVE mg/dL   Hgb urine dipstick NEGATIVE NEGATIVE   Bilirubin Urine NEGATIVE NEGATIVE   Ketones, ur NEGATIVE NEGATIVE mg/dL   Protein, ur NEGATIVE NEGATIVE mg/dL   Nitrite NEGATIVE NEGATIVE   Leukocytes, UA NEGATIVE NEGATIVE    Ct Abdomen Pelvis W Contrast  Result Date: 12/29/2016 CLINICAL DATA:  Abdominal pain with diarrhea EXAM: CT ABDOMEN AND PELVIS WITH CONTRAST TECHNIQUE: Multidetector CT imaging of the abdomen and pelvis was performed using the standard protocol following bolus administration of intravenous contrast. CONTRAST:  162m ISOVUE-300 IOPAMIDOL (ISOVUE-300) INJECTION 61% COMPARISON:  Radiograph 09/02/2016, CT 09/01/2016, 03/16/2015 FINDINGS: Lower chest: Lung bases demonstrate a stable posterior left lower lobe lung nodule. No acute  consolidation or pleural effusion. Normal heart size. Hepatobiliary: Calcified granuloma. Minimal intra hepatic biliary dilatation as before, surgical clips in the gallbladder fossa. Enlarged extrahepatic bile ducts also unchanged. Pancreas: Unremarkable. No pancreatic ductal dilatation or surrounding inflammatory changes. Spleen: Normal in size without focal abnormality. Adrenals/Urinary Tract: Adrenal glands are  unremarkable. Kidneys are normal, without renal calculi, focal lesion, or hydronephrosis. Bladder is unremarkable. Stomach/Bowel: The stomach is nonenlarged. Status post subtotal colectomy. Dilated loops of fluid-filled small bowel within the central abdomen and the pelvis, measuring up to 5 cm. Fecalized segment of dilated distal small bowel in the pelvis with transition point suspected on coronal views in the low pelvis, series 5, image number 62. Small bowel distal to this is decompressed. No significant bowel wall thickening. Vascular/Lymphatic: Aortic atherosclerosis. No enlarged abdominal or pelvic lymph nodes. Reproductive: Prostate calcifications. Other: No free air.  Trace free fluid in the pelvis. Musculoskeletal: No acute or significant osseous findings. IMPRESSION: 1. Status post subtotal colectomy. Multiple loops of fluid-filled dilated mid to distal small bowel in the central abdomen and pelvis. There is a fecalized segment of dilated distal small bowel in the pelvis with transition point suspected in the central lower pelvis, the findings are suspicious for small bowel obstruction either due to stricture or adhesion. 2. Trace free fluid in the pelvis. 3. Stable mild intra and extrahepatic biliary dilatation, likely related to post cholecystectomy changes. Electronically Signed   By: Donavan Foil M.D.   On: 12/29/2016 20:24    Review of Systems  Gastrointestinal: Positive for abdominal pain and nausea. Negative for vomiting.  All other systems reviewed and are negative.  Blood  pressure (!) 182/94, pulse 87, temperature 98.5 F (36.9 C), temperature source Oral, resp. rate 17, height '5\' 11"'  (1.803 m), weight 72.6 kg (160 lb), SpO2 96 %. Physical Exam  Vitals reviewed. Constitutional: He is oriented to person, place, and time. He appears well-developed and well-nourished.  HENT:  Head: Normocephalic and atraumatic.  Right Ear: External ear normal.  Left Ear: External ear normal.  Mouth/Throat: Oropharynx is clear and moist.  Eyes: EOM are normal. Pupils are equal, round, and reactive to light.  Neck: Neck supple.  Cardiovascular: Normal rate, regular rhythm and normal heart sounds.   Respiratory: Effort normal and breath sounds normal.  GI: He exhibits distension (mild). Bowel sounds are decreased. There is tenderness in the periumbilical area. No hernia. Hernia confirmed negative in the ventral area.    Musculoskeletal: Normal range of motion. He exhibits no edema or tenderness.  Lymphadenopathy:    He has no cervical adenopathy.  Neurological: He is alert and oriented to person, place, and time.  Skin: Skin is warm and dry. He is not diaphoretic.  Psychiatric: He has a normal mood and affect. His behavior is normal.    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 I think he needs ng tube now given dilation but he does not want to.  He states can only be done in radiology as well. Will follow npo, admission to medical service planned, recheck films in am, will rediscuss ng tube. Discussed role of surgery which he would like to pursue Can use lovenox.   Ahaan Zobrist 12/30/2016, 12:36 AM

## 2016-12-31 DIAGNOSIS — R030 Elevated blood-pressure reading, without diagnosis of hypertension: Secondary | ICD-10-CM

## 2016-12-31 DIAGNOSIS — K56609 Unspecified intestinal obstruction, unspecified as to partial versus complete obstruction: Secondary | ICD-10-CM

## 2016-12-31 LAB — BASIC METABOLIC PANEL
Anion gap: 5 (ref 5–15)
BUN: 15 mg/dL (ref 6–20)
CALCIUM: 9.1 mg/dL (ref 8.9–10.3)
CO2: 26 mmol/L (ref 22–32)
Chloride: 108 mmol/L (ref 101–111)
Creatinine, Ser: 0.86 mg/dL (ref 0.61–1.24)
Glucose, Bld: 90 mg/dL (ref 65–99)
POTASSIUM: 4.1 mmol/L (ref 3.5–5.1)
Sodium: 139 mmol/L (ref 135–145)

## 2016-12-31 MED ORDER — LORATADINE 10 MG PO TABS
10.0000 mg | ORAL_TABLET | Freq: Every day | ORAL | Status: DC
  Administered 2016-12-31 – 2017-01-01 (×2): 10 mg via ORAL
  Filled 2016-12-31 (×3): qty 1

## 2016-12-31 NOTE — Progress Notes (Signed)
[facility] Progress Note     Subjective: CC:  Had severe peri-umbilical/LLQ abdominal pain around 0200 associated with nausea. Reports one episode of flatus today around 0500. Denies BM since yesterday. Patient is frustrated with recurrent pSBO - states that if the surgeon is not going to fix his problem, he is ready to drink some clears and go home to find someone that will help him. Has been managing pain and obstructive symptoms at home by backing off his diet whenever he feels the pain start or gets distended.  Afebrile, some HTN last night - now improved, one episode of sinus tachycardia (107 bpm) noted.  Objective: Vital signs in last 24 hours: Temp:  [98 F (36.7 C)-98.4 F (36.9 C)] 98.3 F (36.8 C) (07/11 0500) Pulse Rate:  [79-107] 107 (07/11 0500) Resp:  [16-18] 18 (07/11 0500) BP: (126-142)/(60-65) 140/65 (07/11 0500) SpO2:  [95 %-98 %] 97 % (07/11 0500) Last BM Date: 12/30/16  Intake/Output from previous day: 07/10 0701 - 07/11 0700 In: 1925 [I.V.:1925] Out: 3 [Urine:2; Stool:1] Intake/Output this shift: No intake/output data recorded.  PE: Gen:  Alert, NAD, pleasant Card:  Regular rate and rhythm, pedal pulses 2+ BL Pulm:  Normal effort, clear to auscultation bilaterally Abd: Soft, TTP peri-umbilical region, mild abdominal fullness, bowel sounds present in all 4 quadrants - tinkering bowel sounds in LUQ, no HSM, previous surgical scars noted Skin: warm and dry, no rashes   Psych: A&Ox3   Lab Results:   Recent Labs  12/29/16 1112 12/30/16 0444  WBC 9.5 7.7  HGB 13.8 13.4  HCT 40.2 39.8  PLT 244 210   BMET  Recent Labs  12/30/16 0444 12/31/16 0440  NA 137 139  K 3.7 4.1  CL 101 108  CO2 26 26  GLUCOSE 114* 90  BUN 14 15  CREATININE 0.98 0.86  CALCIUM 9.6 9.1   PT/INR No results for input(s): LABPROT, INR in the last 72 hours. CMP     Component Value Date/Time   NA 139 12/31/2016 0440   K 4.1 12/31/2016 0440   CL 108  12/31/2016 0440   CO2 26 12/31/2016 0440   GLUCOSE 90 12/31/2016 0440   BUN 15 12/31/2016 0440   CREATININE 0.86 12/31/2016 0440   CALCIUM 9.1 12/31/2016 0440   PROT 8.1 12/29/2016 1112   ALBUMIN 4.5 12/29/2016 1112   AST 23 12/29/2016 1112   ALT 30 12/29/2016 1112   ALKPHOS 91 12/29/2016 1112   BILITOT 0.9 12/29/2016 1112   GFRNONAA >60 12/31/2016 0440   GFRAA >60 12/31/2016 0440   Lipase     Component Value Date/Time   LIPASE 23 12/29/2016 1112       Studies/Results: Ct Abdomen Pelvis W Contrast  Result Date: 12/29/2016 CLINICAL DATA:  Abdominal pain with diarrhea EXAM: CT ABDOMEN AND PELVIS WITH CONTRAST TECHNIQUE: Multidetector CT imaging of the abdomen and pelvis was performed using the standard protocol following bolus administration of intravenous contrast. CONTRAST:  162mL ISOVUE-300 IOPAMIDOL (ISOVUE-300) INJECTION 61% COMPARISON:  Radiograph 09/02/2016, CT 09/01/2016, 03/16/2015 FINDINGS: Lower chest: Lung bases demonstrate a stable posterior left lower lobe lung nodule. No acute consolidation or pleural effusion. Normal heart size. Hepatobiliary: Calcified granuloma. Minimal intra hepatic biliary dilatation as before, surgical clips in the gallbladder fossa. Enlarged extrahepatic bile ducts also unchanged. Pancreas: Unremarkable. No pancreatic ductal dilatation or surrounding inflammatory changes. Spleen: Normal in size without focal abnormality. Adrenals/Urinary Tract: Adrenal glands are unremarkable. Kidneys are normal, without renal calculi, focal lesion, or  hydronephrosis. Bladder is unremarkable. Stomach/Bowel: The stomach is nonenlarged. Status post subtotal colectomy. Dilated loops of fluid-filled small bowel within the central abdomen and the pelvis, measuring up to 5 cm. Fecalized segment of dilated distal small bowel in the pelvis with transition point suspected on coronal views in the low pelvis, series 5, image number 62. Small bowel distal to this is decompressed. No  significant bowel wall thickening. Vascular/Lymphatic: Aortic atherosclerosis. No enlarged abdominal or pelvic lymph nodes. Reproductive: Prostate calcifications. Other: No free air.  Trace free fluid in the pelvis. Musculoskeletal: No acute or significant osseous findings. IMPRESSION: 1. Status post subtotal colectomy. Multiple loops of fluid-filled dilated mid to distal small bowel in the central abdomen and pelvis. There is a fecalized segment of dilated distal small bowel in the pelvis with transition point suspected in the central lower pelvis, the findings are suspicious for small bowel obstruction either due to stricture or adhesion. 2. Trace free fluid in the pelvis. 3. Stable mild intra and extrahepatic biliary dilatation, likely related to post cholecystectomy changes. Electronically Signed   By: Donavan Foil M.D.   On: 12/29/2016 20:24   Dg Abd 2 Views  Result Date: 12/30/2016 CLINICAL DATA:  Nausea, vomiting, diarrhea for 2 days, history of multiple small bowel obstructions EXAM: ABDOMEN - 2 VIEW COMPARISON:  CT abdomen pelvis of 12/29/2016 FINDINGS: There is still some gaseous distention of small bowel loops consistent with a persistent partial small bowel obstruction. Very little colonic bowel gas is seen. No free air is noted on the erect view. IMPRESSION: Persistently dilated small bowel loops consistent with partial small bowel obstruction. Electronically Signed   By: Ivar Drape M.D.   On: 12/30/2016 12:56    Anti-infectives: Anti-infectives    None     Assessment/Plan SBO, recurrent - suspect 2/2 to adhesive disease from multiple abdominal surgeries (stc with ileostomy, ileostomy takedown, repair of ileostomy site hernia, repair of ventral incisional hernia with mesh, and lap chole). - Abdominal film 7/10 showed interval improvement in small bowel distention, still with some dilated loops.  - start clears  GERD- PPI  Chronic back pain- robaxin, hydrocodone at home; reports  recent course of steroids  FEN - clears; BMET WNL this AM ID- none VTE - SCD's, Lovenox    LOS: 1 day    Jill Alexanders , [facility] Surgery 12/31/2016, 7:26 AM Pager: [phone] Consults: [phone] Mon-Fri 7:00 am-4:30 pm Sat-Sun 7:00 am-11:30 am

## 2016-12-31 NOTE — Progress Notes (Signed)
[facility]  PROGRESS NOTE  Kent [name] NLG:[id number] DOB: 05/24/1941 DOA: 12/29/2016 PCP: Shirline Frees, MD   Brief HPI:   76 y.o. male with PMH of multiple abdominal surgeries including subtotal colectomy with ileostomy followed by ileostomy takedown, repair of ileostomy site hernia, repair of ventral incisional hernia with mesh as well as laparoscopic cholecystectomy who has had problems with recurrent SBO, and who was admitted 12/29/16 with a 24-hour history of abdominal pain and nausea. CT scan showed a recurrent SBO. Surgery has been consulted for further management.     Subjective   Patient seen and examined, denies nausea vomiting. Passing some flatus this morning.   Assessment/Plan:     1. Recurrent small bowel obstruction- patient has history of multiple bowel surgeries. Likely from adhesions. Initially he refused NG tube for decompression. Now he is improving. Passed some flatus this morning. General surgery has started on clear liquid diet and plans to see him as outpatient for surgery. 2. Elevated blood pressure-patient had elevated blood pressure at the time of admission. Resolved at this time. Continue hydralazine as needed.    DVT prophylaxis: Lovenox  Code Status: Full code  Family Communication: Discussed with patient's wife at bedside   Disposition Plan: Likely home in next 24 hours   Consultants:  Gen. surgery  Procedures:  None   Continuous infusions . sodium chloride 75 mL/hr at 12/29/16 1825  . sodium chloride 100 mL/hr at 12/30/16 2105      Antibiotics:   Anti-infectives    None       Objective   Vitals:   12/30/16 1300 12/30/16 2100 12/31/16 0500 12/31/16 0908  BP: 126/63 (!) 142/60 140/65 126/70  Pulse: 90 79 (!) 107   Resp: 16 16 18    Temp: 98 F (36.7 C) 98.4 F (36.9 C) 98.3 F (36.8 C)   TempSrc: Oral Oral Oral   SpO2: 95% 98% 97%   Weight:      Height:        Intake/Output Summary (Last 24 hours) at  12/31/16 1407 Last data filed at 12/30/16 1700  Gross per 24 hour  Intake             1925 ml  Output                0 ml  Net             1925 ml   Filed Weights   12/29/16 1108  Weight: 72.6 kg (160 lb)     Physical Examination:  Physical Exam: Eyes: No icterus, extraocular muscles intact  Mouth: Oral mucosa is moist, no lesions on palate,  Neck: Supple, no deformities, masses, or tenderness Lungs: Normal respiratory effort, bilateral clear to auscultation, no crackles or wheezes.  Heart: Regular rate and rhythm, S1 and S2 normal, no murmurs, rubs auscultated Abdomen: BS normoactive,soft,nondistended,non-tender to palpation,no organomegaly Extremities: No pretibial edema, no erythema, no cyanosis, no clubbing Neuro : Alert and oriented to time, place and person, No focal deficits      Data Reviewed: I have personally reviewed following labs and imaging studies  CBG: No results for input(s): GLUCAP in the last 168 hours.  CBC:  Recent Labs Lab 12/29/16 1112 12/30/16 0444  WBC 9.5 7.7  HGB 13.8 13.4  HCT 40.2 39.8  MCV 85.2 86.3  PLT 244 081    Basic Metabolic Panel:  Recent Labs Lab 12/29/16 1112 12/30/16 0444 12/31/16 0440  NA 135 137 139  K 4.2 3.7  4.1  CL 97* 101 108  CO2 30 26 26   GLUCOSE 179* 114* 90  BUN 11 14 15   CREATININE 1.12 0.98 0.86  CALCIUM 10.2 9.6 9.1    No results found for this or any previous visit (from the past 240 hour(s)).   Liver Function Tests:  Recent Labs Lab 12/29/16 1112  AST 23  ALT 30  ALKPHOS 91  BILITOT 0.9  PROT 8.1  ALBUMIN 4.5    Recent Labs Lab 12/29/16 1112  LIPASE 23      Studies: Ct Abdomen Pelvis W Contrast  Result Date: 12/29/2016 CLINICAL DATA:  Abdominal pain with diarrhea EXAM: CT ABDOMEN AND PELVIS WITH CONTRAST TECHNIQUE: Multidetector CT imaging of the abdomen and pelvis was performed using the standard protocol following bolus administration of intravenous contrast. CONTRAST:   156mL ISOVUE-300 IOPAMIDOL (ISOVUE-300) INJECTION 61% COMPARISON:  Radiograph 09/02/2016, CT 09/01/2016, 03/16/2015 FINDINGS: Lower chest: Lung bases demonstrate a stable posterior left lower lobe lung nodule. No acute consolidation or pleural effusion. Normal heart size. Hepatobiliary: Calcified granuloma. Minimal intra hepatic biliary dilatation as before, surgical clips in the gallbladder fossa. Enlarged extrahepatic bile ducts also unchanged. Pancreas: Unremarkable. No pancreatic ductal dilatation or surrounding inflammatory changes. Spleen: Normal in size without focal abnormality. Adrenals/Urinary Tract: Adrenal glands are unremarkable. Kidneys are normal, without renal calculi, focal lesion, or hydronephrosis. Bladder is unremarkable. Stomach/Bowel: The stomach is nonenlarged. Status post subtotal colectomy. Dilated loops of fluid-filled small bowel within the central abdomen and the pelvis, measuring up to 5 cm. Fecalized segment of dilated distal small bowel in the pelvis with transition point suspected on coronal views in the low pelvis, series 5, image number 62. Small bowel distal to this is decompressed. No significant bowel wall thickening. Vascular/Lymphatic: Aortic atherosclerosis. No enlarged abdominal or pelvic lymph nodes. Reproductive: Prostate calcifications. Other: No free air.  Trace free fluid in the pelvis. Musculoskeletal: No acute or significant osseous findings. IMPRESSION: 1. Status post subtotal colectomy. Multiple loops of fluid-filled dilated mid to distal small bowel in the central abdomen and pelvis. There is a fecalized segment of dilated distal small bowel in the pelvis with transition point suspected in the central lower pelvis, the findings are suspicious for small bowel obstruction either due to stricture or adhesion. 2. Trace free fluid in the pelvis. 3. Stable mild intra and extrahepatic biliary dilatation, likely related to post cholecystectomy changes. Electronically Signed    By: Donavan Foil M.D.   On: 12/29/2016 20:24   Dg Abd 2 Views  Result Date: 12/30/2016 CLINICAL DATA:  Nausea, vomiting, diarrhea for 2 days, history of multiple small bowel obstructions EXAM: ABDOMEN - 2 VIEW COMPARISON:  CT abdomen pelvis of 12/29/2016 FINDINGS: There is still some gaseous distention of small bowel loops consistent with a persistent partial small bowel obstruction. Very little colonic bowel gas is seen. No free air is noted on the erect view. IMPRESSION: Persistently dilated small bowel loops consistent with partial small bowel obstruction. Electronically Signed   By: Ivar Drape M.D.   On: 12/30/2016 12:56    Scheduled Meds: . enoxaparin (LOVENOX) injection  40 mg Subcutaneous Daily  . loratadine  10 mg Oral Daily  . methocarbamol  1,000 mg Oral BID  . pantoprazole  40 mg Oral Daily      Time spent: 25 min  [facility] Pager [phone]. If 7PM-7AM, please contact night-coverage at [email or web address], Office  [phone]  password [email or web address]  12/31/2016, 2:07 PM  LOS: 1 day

## 2017-01-01 MED ORDER — OXYCODONE HCL ER 20 MG PO T12A
20.0000 mg | EXTENDED_RELEASE_TABLET | Freq: Two times a day (BID) | ORAL | Status: DC
  Administered 2017-01-01: 20 mg via ORAL
  Filled 2017-01-01: qty 1

## 2017-01-01 NOTE — Discharge Summary (Signed)
Physician Discharge Summary  Kent [name] VWU:[id number] DOB: 1941/04/04 DOA: 12/29/2016  PCP: Johny Blamer, MD  Admit date: 12/29/2016 Discharge date: 01/01/2017  Time spent: 25* minutes  Recommendations for Outpatient Follow-up:  1. Follow up general surgery in 2 weeks 2. 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 CT scan showed a recurrent SBO. Surgery was consulted for further management.    Hospital Course:  1. Recurrent small bowel obstruction- patient has history of multiple bowel surgeries. Likely from adhesions. Initially he refused NG tube for decompression. Now he is improving. . General surgery has started on soft diet and plans to see him as outpatient for surgery. Patient be discharged home. 2. Elevated blood pressure-patient had elevated blood pressure at the time of admission. Likely from pain and anxiety as patient says that his blood pressure is stable at home. No antihypertensives will be prescribed at this time. Patient to follow up PCP for checking blood pressures as outpatient  Procedures:  None  Consultations:  Gen. surgery  Discharge Exam: Vitals:   12/31/16 2135 01/01/17 0622  BP: (!) 150/78 (!) 160/69  Pulse: 70 76  Resp: 18 16  Temp: 97.8 F (36.6 C) 98.1 F (36.7 C)    General: Appears in  no acute distress Cardiovascular: S1-S2, regular Respiratory: Clear to auscultation bilaterally Abdomen- soft, non-tender, no organomegaly  Discharge Instructions   Discharge Instructions    Diet - low sodium heart healthy    Complete by:  As directed    Increase activity slowly    Complete by:  As directed      Current Discharge Medication List    CONTINUE these medications which have NOT CHANGED   Details  Ascorbic Acid (VITAMIN C PO) Take 1 tablet by mouth daily.    HYDROcodone-acetaminophen (NORCO) 10-325 MG tablet Take 1 tablet by mouth every 6 (six) hours as needed for moderate pain.    lidocaine (LIDODERM) 5 % Place 1 patch onto the skin daily as needed (for pain). Remove & Discard patch within 12 hours or as directed by MD    Loratadine-Pseudoephedrine (CLARITIN-D 24 HOUR PO) Take 1 tablet by mouth daily.    methocarbamol (ROBAXIN) 500 MG tablet Take 1,000 mg by mouth 2 (two) times daily.     omeprazole (PRILOSEC) 20 MG capsule Take 20 mg by mouth daily.    oxyCODONE (OXYCONTIN) 20 MG 12 hr tablet Take 20 mg by mouth every 12 (twelve) hours.    Probiotic Product (PROBIOTIC PO) Take 1 tablet by mouth daily.    vitamin E 400 UNIT capsule Take 400 Units by mouth daily.      STOP taking these medications     predniSONE (STERAPRED UNI-PAK 21 TAB) 5 MG (21) TBPK tablet        No Known Allergies Follow-up Information    Kent Gelinas, MD. Schedule an appointment as soon  as possible for a visit in 2 week(s).   Specialty:  General Surgery Contact information: [address] [place] [address] [phone]            The results of significant diagnostics from this hospitalization (including imaging, microbiology, ancillary and laboratory) are listed below for reference.    Significant Diagnostic Studies: Ct Abdomen Pelvis W Contrast  Result Date: 12/29/2016 CLINICAL DATA:  Abdominal pain with diarrhea EXAM: CT ABDOMEN AND PELVIS WITH  CONTRAST TECHNIQUE: Multidetector CT imaging of the abdomen and pelvis was performed using the standard protocol following bolus administration of intravenous contrast. CONTRAST:  ISOVUE-300 IOPAMIDOL (ISOVUE-300) INJECTION 61% COMPARISON:  Radiograph 09/02/2016, CT 09/01/2016, 03/16/2015 FINDINGS: Lower chest: Lung bases demonstrate a stable posterior left lower lobe lung nodule. No acute consolidation or pleural effusion. Normal heart size. Hepatobiliary: Calcified granuloma. Minimal intra hepatic biliary dilatation as before, surgical clips in the gallbladder fossa. Enlarged extrahepatic bile ducts also unchanged. Pancreas: Unremarkable. No pancreatic ductal dilatation or surrounding inflammatory changes. Spleen: Normal in size without focal abnormality. Adrenals/Urinary Tract: Adrenal glands are unremarkable. Kidneys are normal, without renal calculi, focal lesion, or hydronephrosis. Bladder is unremarkable. Stomach/Bowel: The stomach is nonenlarged. Status post subtotal colectomy. Dilated loops of fluid-filled small bowel within the central abdomen and the pelvis, measuring up to 5 cm. Fecalized segment of dilated distal small bowel in the pelvis with transition point suspected on coronal views in the low pelvis, series 5, image number 62. Small bowel distal to this is decompressed. No significant bowel wall thickening. Vascular/Lymphatic: Aortic atherosclerosis. No enlarged abdominal or pelvic lymph nodes. Reproductive: Prostate calcifications. Other: No free air.  Trace free fluid in the pelvis. Musculoskeletal: No acute or significant osseous findings. IMPRESSION: 1. Status post subtotal colectomy. Multiple loops of fluid-filled dilated mid to distal small bowel in the central abdomen and pelvis. There is a fecalized segment of dilated distal small bowel in the pelvis with transition point suspected in the central lower pelvis, the findings are suspicious for small bowel obstruction either due to  stricture or adhesion. 2. Trace free fluid in the pelvis. 3. Stable mild intra and extrahepatic biliary dilatation, likely related to post cholecystectomy changes. Electronically Signed   By: Jasmine Pang M.D.   On: 12/29/2016 20:24   Dg Abd 2 Views  Result Date: 12/30/2016 CLINICAL DATA:  Nausea, vomiting, diarrhea for 2 days, history of multiple small bowel obstructions EXAM: ABDOMEN - 2 VIEW COMPARISON:  CT abdomen pelvis of 12/29/2016 FINDINGS: There is still some gaseous distention of small bowel loops consistent with a persistent partial small bowel obstruction. Very little colonic bowel gas is seen. No free air is noted on the erect view. IMPRESSION: Persistently dilated small bowel loops consistent with partial small bowel obstruction. Electronically Signed   By: Dwyane Dee M.D.   On: 12/30/2016 12:56    Microbiology: No results found for this or any previous visit (from the past 240 hour(s)).   Labs: Basic Metabolic Panel:  Recent Labs Lab 12/29/16 1112 12/30/16 0444 12/31/16 0440  NA 135 137 139  K 4.2 3.7 4.1  CL 97* 101 108  CO2 30 26 26   GLUCOSE 179* 114* 90  BUN 11 14 15   CREATININE 1.12 0.98 0.86  CALCIUM 10.2 9.6 9.1   Liver Function Tests:  Recent Labs Lab 12/29/16 1112  AST 23  ALT 30  ALKPHOS 91  BILITOT 0.9  PROT 8.1  ALBUMIN 4.5    Recent Labs Lab  12/29/16 1112  LIPASE 23   No results for input(s): AMMONIA in the last 168 hours. CBC:  Recent Labs Lab 12/29/16 1112 12/30/16 0444  WBC 9.5 7.7  HGB 13.8 13.4  HCT 40.2 39.8  MCV 85.2 86.3  PLT 244 210       Signed:  Shaqueena Mauceri S MD.  [facility] 01/01/2017, 1:37 PM

## 2017-01-01 NOTE — Discharge Instructions (Signed)
Small Bowel Obstruction °A small bowel obstruction means that something is blocking the small bowel. The small bowel is also called the small intestine. It is the long tube that connects the stomach to the colon. An obstruction will stop food and fluids from passing through the small bowel. Treatment depends on what is causing the problem and how bad the problem is. °Follow these instructions at home: °· Get a lot of rest. °· Follow your diet as told by your doctor. You may need to: °? Only drink clear liquids until you start to get better. °? Avoid solid foods as told by your doctor. °· Take over-the-counter and prescription medicines only as told by your doctor. °· Keep all follow-up visits as told by your doctor. This is important. °Contact a doctor if: °· You have a fever. °· You have chills. °Get help right away if: °· You have pain or cramps that get worse. °· You throw up (vomit) blood. °· You have a feeling of being sick to your stomach (nausea) that does not go away. °· You cannot stop throwing up. °· You cannot drink fluids. °· You feel confused. °· You feel dry or thirsty (dehydrated). °· Your belly gets more bloated. °· You feel weak or you pass out (faint). °This information is not intended to replace advice given to you by your health care provider. Make sure you discuss any questions you have with your health care provider. °Document Released: 07/17/2004 Document Revised: 02/04/2016 Document Reviewed: 08/03/2014 °Elsevier Interactive Patient Education © 2018 [employer] Inc[employer] ° °

## 2017-01-01 NOTE — Progress Notes (Signed)
   Subjective/Chief Complaint: Tolerated clears and had several more liquid BMs, asking for his oxycontin for his back   Objective: Vital signs in last 24 hours: Temp:  [97.8 F (36.6 C)-98.1 F (36.7 C)] 98.1 F (36.7 C) (07/12 0622) Pulse Rate:  [70-100] 76 (07/12 0622) Resp:  [16-20] 16 (07/12 0622) BP: (126-160)/(63-78) 160/69 (07/12 0622) SpO2:  [98 %-100 %] 99 % (07/12 0622) Last BM Date: 12/31/16  Intake/Output from previous day: 07/11 0701 - 07/12 0700 In: 450 [P.O.:450] Out: 2 [Urine:2] Intake/Output this shift: Total I/O In: 260 [P.O.:260] Out: 1 [Urine:1]  General appearance: alert and cooperative Resp: clear to auscultation bilaterally Cardio: regular rate and rhythm GI: soft, less distended, +BS, nontender  Lab Results:   Recent Labs  12/29/16 1112 12/30/16 0444  WBC 9.5 7.7  HGB 13.8 13.4  HCT 40.2 39.8  PLT 244 210   BMET  Recent Labs  12/30/16 0444 12/31/16 0440  NA 137 139  K 3.7 4.1  CL 101 108  CO2 26 26  GLUCOSE 114* 90  BUN 14 15  CREATININE 0.98 0.86  CALCIUM 9.6 9.1   PT/INR No results for input(s): LABPROT, INR in the last 72 hours. ABG No results for input(s): PHART, HCO3 in the last 72 hours.  Invalid input(s): PCO2, PO2  Studies/Results: Dg Abd 2 Views  Result Date: 12/30/2016 CLINICAL DATA:  Nausea, vomiting, diarrhea for 2 days, history of multiple small bowel obstructions EXAM: ABDOMEN - 2 VIEW COMPARISON:  CT abdomen pelvis of 12/29/2016 FINDINGS: There is still some gaseous distention of small bowel loops consistent with a persistent partial small bowel obstruction. Very little colonic bowel gas is seen. No free air is noted on the erect view. IMPRESSION: Persistently dilated small bowel loops consistent with partial small bowel obstruction. Electronically Signed   By: Ivar Drape M.D.   On: 12/30/2016 12:56    Anti-infectives: Anti-infectives    None      Assessment/Plan: SBO, recurrent - advance to soft  diet - can be D/Cd if tolerates - I will follow in the office  GERD- PPI  Chronic back pain- home oxycontin, MM relaxer  LOS: 2 days    Kent [name] E 01/01/2017

## 2017-01-01 NOTE — Progress Notes (Signed)
Pt expressed concerns about discharging home this afternoon after eating a soft lunch tray because he was having increased abdominal pain and bloating. Pt ambulated around unit for about an hour after eating with no relief of pain. Melina Modena, PA notified and stated that pt was still ok to discharge today as long as pt did not have any vomiting. Pt and Dr. Darrick Meigs was made aware.  Discharge instructions were reviewed with pt, when he again stated he didn't know if he should be discharged today. Pt says he "doesn't want to go home and end up right back here." He asked about speaking directly with Dr. Grandville Silos. RN paged Dr. Grandville Silos, who spoke with pt on the phone. The pt seemed assured afterwards. The pt stated he contacted his granddaughter to come pick him up.   Pt's PIV removed, and belongings sent with pt.   [place], Jerry Caras

## 2017-01-14 ENCOUNTER — Ambulatory Visit: Payer: Self-pay | Admitting: General Surgery

## 2017-03-04 NOTE — Pre-Procedure Instructions (Signed)
Kent [name]  03/04/2017      [id number] - [place], [place] - [place]. AT [name] [place] [address] Phone: [phone] Fax: [facility] [name], [place] - [address] [place] [name] [address] Phone: [phone] Fax: [phone]    Your procedure is scheduled on Sept 18  Report to [facility] at 530 A.M.  Call this number if you have problems the morning of surgery:  [phone]   Remember:  Do not eat food or drink liquids after midnight.  Take these medicines the morning of surgery with A SIP OF WATER Hydrocodone if needed, Methocarbamol (Robaxin), Oxycodone (Oxycontin)  7 days prior to surgery stop taking aspirin, BC's, Goody's, Herbal medications, Fish Oil, Ibuprofen, Advil, Motrin, Aleve   Do not wear jewelry, make-up or nail polish.  Do not wear lotions, powders, or perfumes, or deoderant.  Do not shave 48 hours prior to surgery.  Men may shave face and neck.  Do not bring valuables to the hospital.  [facility] is not responsible for any belongings or valuables.  Contacts, dentures or bridgework may not be worn into surgery.  Leave your suitcase in the car.  After surgery it may be brought to your room.  For patients admitted to the hospital, discharge time will be determined by your treatment team.  Patients discharged the day of surgery will not be allowed to drive home.  Special instructions:  [facility] - Preparing for Surgery  Before surgery, you can play an important role.  Because skin is not sterile, your skin needs to be as free of germs as possible.  You can reduce the number of germs on you skin by washing with CHG (chlorahexidine gluconate) soap before surgery.  CHG is an antiseptic cleaner which kills germs and bonds with the skin to continue killing germs even after washing.  Please DO NOT use if you  have an allergy to CHG or antibacterial soaps.  If your skin becomes reddened/irritated stop using the CHG and inform your nurse when you arrive at Short Stay.  Do not shave (including legs and underarms) for at least 48 hours prior to the first CHG shower.  You may shave your face.  Please follow these instructions carefully:   1.  Shower with CHG Soap the night before surgery and the  morning of Surgery.  2.  If you choose to wash your hair, wash your hair first as usual with your  normal shampoo.  3.  After you shampoo, rinse your hair and body thoroughly to remove the Shampoo.  4.  Use CHG as you would any other liquid soap.  You can apply chg directly   to the skin and wash gently with scrungie or a clean washcloth.  5.  Apply the CHG Soap to your body ONLY FROM THE NECK DOWN.  Do not use on open wounds or open sores.  Avoid contact with your eyes,  ears, mouth and genitals (private parts).  Wash genitals (private parts)  with your normal soap.  6.  Wash thoroughly, paying special attention to the area where your surgery will be performed.  7.  Thoroughly rinse your body with warm water from the neck down.  8.  DO NOT shower/wash with your normal soap after using and rinsing off  the CHG Soap.  9.  Pat yourself dry with a clean towel.  10.  Wear clean pajamas.            11.  Place clean sheets on your bed the night of your first shower and do not  sleep with pets.  Day of Surgery  Do not apply any lotions/deoderants the morning of surgery.  Please wear clean clothes to the hospital/surgery center.     Please read over the following fact sheets that you were given. Pain Booklet, Coughing and Deep Breathing, MRSA Information and Surgical Site Infection Prevention

## 2017-03-05 ENCOUNTER — Encounter (HOSPITAL_COMMUNITY)
Admission: RE | Admit: 2017-03-05 | Discharge: 2017-03-05 | Disposition: A | Discharge status: Home / Self Care | Payer: Medicare Other | Source: Ambulatory Visit | Attending: General Surgery | Admitting: General Surgery

## 2017-03-05 ENCOUNTER — Other Ambulatory Visit: Payer: Self-pay

## 2017-03-05 ENCOUNTER — Encounter (HOSPITAL_COMMUNITY): Payer: Self-pay

## 2017-03-05 DIAGNOSIS — K56600 Partial intestinal obstruction, unspecified as to cause: Secondary | ICD-10-CM | POA: Diagnosis not present

## 2017-03-05 DIAGNOSIS — Z01810 Encounter for preprocedural cardiovascular examination: Secondary | ICD-10-CM | POA: Diagnosis not present

## 2017-03-05 LAB — CBC
HEMATOCRIT: 35.5 % — AB (ref 39.0–52.0)
HEMOGLOBIN: 12 g/dL — AB (ref 13.0–17.0)
MCH: 29.6 pg (ref 26.0–34.0)
MCHC: 33.8 g/dL (ref 30.0–36.0)
MCV: 87.4 fL (ref 78.0–100.0)
Platelets: 172 10*3/uL (ref 150–400)
RBC: 4.06 MIL/uL — AB (ref 4.22–5.81)
RDW: 13.9 % (ref 11.5–15.5)
WBC: 4.3 10*3/uL (ref 4.0–10.5)

## 2017-03-05 LAB — TYPE AND SCREEN
ABO/RH(D): O NEG
ANTIBODY SCREEN: NEGATIVE

## 2017-03-05 LAB — BASIC METABOLIC PANEL
ANION GAP: 7 (ref 5–15)
BUN: 14 mg/dL (ref 6–20)
CALCIUM: 9 mg/dL (ref 8.9–10.3)
CO2: 25 mmol/L (ref 22–32)
Chloride: 103 mmol/L (ref 101–111)
Creatinine, Ser: 1 mg/dL (ref 0.61–1.24)
GFR calc non Af Amer: >60 mL/min (ref >60)
Glucose, Bld: 93 mg/dL (ref 65–99)
POTASSIUM: 4.5 mmol/L (ref 3.5–5.1)
Sodium: 135 mmol/L (ref 135–145)

## 2017-03-05 NOTE — Progress Notes (Signed)
Echo was done earlier this year because pt. Had mentioned to his PCP ( Dr. Earney Mallet) that he was dizzy upon exiting the shower. Pt. Reports he was told that the ECHO was wnl. Pt. Reports his bedroom is upstairs & he goes up & down stairs without stopping multiple times per day.  Pt. Denies all complaints today relative to his chest & breathing.

## 2017-03-05 NOTE — Pre-Procedure Instructions (Signed)
Kent [name]  03/05/2017      [id number] - [place], [place] - [place]. AT [address]. [place] [address] Phone: [phone] Fax: [facility] [address] [place] - [address] [place] [place] [address] Phone: [phone] Fax: [phone]    Your procedure is scheduled on Sept 18  Report to [facility] at 530 A.M.  Call this number if you have problems the morning of surgery:  [phone]   Remember:  Do not eat food or drink liquids after midnight.  Take these medicines the morning of surgery with A SIP OF WATER Methocarbamol (Robaxin), Oxycodone (Oxycontin)  7 days prior to surgery stop taking aspirin, BC's, Goody's, Herbal medications, Fish Oil, Ibuprofen, Advil, Motrin, Aleve, IN ADDITION STOP- VITAMIN E   Do not wear jewelry, make-up or nail polish.  Do not wear lotions, powders, or perfumes, or deoderant.  Do not shave 48 hours prior to surgery.  Men may shave face and neck.  Do not bring valuables to the hospital.  [facility] is not responsible for any belongings or valuables.  Contacts, dentures or bridgework may not be worn into surgery.  Leave your suitcase in the car.  After surgery it may be brought to your room.  For patients admitted to the hospital, discharge time will be determined by your treatment team.  Patients discharged the day of surgery will not be allowed to drive home.  Special instructions:  [facility] - Preparing for Surgery  Before surgery, you can play an important role.  Because skin is not sterile, your skin needs to be as free of germs as possible.  You can reduce the number of germs on you skin by washing with CHG (chlorahexidine gluconate) soap before surgery.  CHG is an antiseptic cleaner which kills germs and bonds with the skin to continue killing germs even after washing.  Please DO NOT use if  you have an allergy to CHG or antibacterial soaps.  If your skin becomes reddened/irritated stop using the CHG and inform your nurse when you arrive at Short Stay.  Do not shave (including legs and underarms) for at least 48 hours prior to the first CHG shower.  You may shave your face.  Please follow these instructions carefully:   1.  Shower with CHG Soap the night before surgery and the  morning of Surgery.  2.  If you choose to wash your hair, wash your hair first as usual with your  normal shampoo.  3.  After you shampoo, rinse your hair and body thoroughly to remove the Shampoo.  4.  Use CHG as you would any other liquid soap.  You can apply chg directly   to the skin and wash gently with scrungie or a clean washcloth.  5.  Apply the CHG Soap to your body ONLY FROM THE NECK DOWN.  Do not use on open wounds or open sores.  Avoid contact with your eyes,  ears, mouth and genitals (private parts).  Wash genitals (private parts)  with your normal soap.  6.  Wash thoroughly, paying special attention to the area where your surgery will be performed.  7.  Thoroughly rinse your body with warm water from the neck down.  8.  DO NOT shower/wash with your normal soap after using and rinsing off  the CHG Soap.  9.  Pat yourself dry with a clean towel.  10.  Wear clean pajamas.            11.  Place clean sheets on your bed the night of your first shower and do not  sleep with pets.  Day of Surgery  Do not apply any lotions/deoderants the morning of surgery.  Please wear clean clothes to the hospital/surgery center.     Please read over the following fact sheets that you were given. Pain Booklet, Coughing and Deep Breathing, MRSA Information and Surgical Site Infection Prevention

## 2017-03-09 NOTE — Anesthesia Preprocedure Evaluation (Addendum)
Anesthesia Evaluation  Patient identified by MRN, date of birth, ID band Patient awake    Reviewed: Allergy & Precautions, NPO status , Patient's Chart, lab work & pertinent test results  History of Anesthesia Complications Negative for: history of anesthetic complications  Airway Mallampati: II  TM Distance: >3 FB Neck ROM: Full    Dental no notable dental hx. (+) Caps, Dental Advisory Given   Pulmonary former smoker,    Pulmonary exam normal        Cardiovascular negative cardio ROS Normal cardiovascular exam     Neuro/Psych negative neurological ROS  negative psych ROS   GI/Hepatic Neg liver ROS, GERD  ,  Endo/Other  negative endocrine ROS  Renal/GU negative Renal ROS     Musculoskeletal negative musculoskeletal ROS (+)   Abdominal   Peds  Hematology negative hematology ROS (+)   Anesthesia Other Findings Day of surgery medications reviewed with the patient.  Reproductive/Obstetrics                            Anesthesia Physical Anesthesia Plan  ASA: III  Anesthesia Plan: General   Post-op Pain Management:    Induction: Intravenous, Rapid sequence and Cricoid pressure planned  PONV Risk Score and Plan: 3 and Ondansetron, Dexamethasone and Diphenhydramine  Airway Management Planned: Oral ETT  Additional Equipment:   Intra-op Plan:   Post-operative Plan: Extubation in OR  Informed Consent: I have reviewed the patients History and Physical, chart, labs and discussed the procedure including the risks, benefits and alternatives for the proposed anesthesia with the patient or authorized representative who has indicated his/her understanding and acceptance.   Dental advisory given  Plan Discussed with: CRNA, Anesthesiologist and Surgeon  Anesthesia Plan Comments:        Anesthesia Quick Evaluation

## 2017-03-10 ENCOUNTER — Inpatient Hospital Stay (HOSPITAL_COMMUNITY)
Admission: RE | Admit: 2017-03-10 | Discharge: 2017-03-16 | DRG: 336 | Disposition: A | Discharge status: Home / Self Care | Payer: Medicare Other | Source: Ambulatory Visit | Attending: General Surgery | Admitting: General Surgery

## 2017-03-10 ENCOUNTER — Inpatient Hospital Stay (HOSPITAL_COMMUNITY): Payer: Medicare Other | Admitting: Certified Registered Nurse Anesthetist

## 2017-03-10 ENCOUNTER — Encounter (HOSPITAL_COMMUNITY)
Admission: RE | Disposition: A | Discharge status: Home / Self Care | Payer: Self-pay | Source: Ambulatory Visit | Attending: General Surgery

## 2017-03-10 ENCOUNTER — Encounter (HOSPITAL_COMMUNITY): Payer: Self-pay | Admitting: Certified Registered Nurse Anesthetist

## 2017-03-10 DIAGNOSIS — S301XXA Contusion of abdominal wall, initial encounter: Secondary | ICD-10-CM | POA: Diagnosis present

## 2017-03-10 DIAGNOSIS — M549 Dorsalgia, unspecified: Secondary | ICD-10-CM | POA: Diagnosis present

## 2017-03-10 DIAGNOSIS — K219 Gastro-esophageal reflux disease without esophagitis: Secondary | ICD-10-CM | POA: Diagnosis present

## 2017-03-10 DIAGNOSIS — Z9049 Acquired absence of other specified parts of digestive tract: Secondary | ICD-10-CM

## 2017-03-10 DIAGNOSIS — Z79899 Other long term (current) drug therapy: Secondary | ICD-10-CM

## 2017-03-10 DIAGNOSIS — K56600 Partial intestinal obstruction, unspecified as to cause: Principal | ICD-10-CM | POA: Diagnosis present

## 2017-03-10 DIAGNOSIS — X58XXXA Exposure to other specified factors, initial encounter: Secondary | ICD-10-CM | POA: Diagnosis present

## 2017-03-10 DIAGNOSIS — Z87890 Personal history of sex reassignment: Secondary | ICD-10-CM | POA: Diagnosis not present

## 2017-03-10 DIAGNOSIS — K660 Peritoneal adhesions (postprocedural) (postinfection): Secondary | ICD-10-CM | POA: Diagnosis present

## 2017-03-10 DIAGNOSIS — Z9889 Other specified postprocedural states: Secondary | ICD-10-CM

## 2017-03-10 DIAGNOSIS — S36439A Laceration of unspecified part of small intestine, initial encounter: Secondary | ICD-10-CM | POA: Diagnosis not present

## 2017-03-10 DIAGNOSIS — Y848 Other medical procedures as the cause of abnormal reaction of the patient, or of later complication, without mention of misadventure at the time of the procedure: Secondary | ICD-10-CM | POA: Diagnosis present

## 2017-03-10 DIAGNOSIS — Y92234 Operating room of hospital as the place of occurrence of the external cause: Secondary | ICD-10-CM | POA: Diagnosis present

## 2017-03-10 DIAGNOSIS — G8929 Other chronic pain: Secondary | ICD-10-CM | POA: Diagnosis present

## 2017-03-10 DIAGNOSIS — I808 Phlebitis and thrombophlebitis of other sites: Secondary | ICD-10-CM | POA: Diagnosis present

## 2017-03-10 HISTORY — PX: EXPLORATORY LAPAROTOMY: SUR591

## 2017-03-10 HISTORY — PX: LAPAROTOMY: SHX154

## 2017-03-10 HISTORY — PX: LYSIS OF ADHESION: SHX5961

## 2017-03-10 LAB — CREATININE, SERUM: Creatinine, Ser: 0.95 mg/dL (ref 0.61–1.24)

## 2017-03-10 LAB — CBC
HCT: 37 % — ABNORMAL LOW (ref 39.0–52.0)
Hemoglobin: 12.1 g/dL — ABNORMAL LOW (ref 13.0–17.0)
MCH: 28.3 pg (ref 26.0–34.0)
MCHC: 32.7 g/dL (ref 30.0–36.0)
MCV: 86.7 fL (ref 78.0–100.0)
PLATELETS: 176 10*3/uL (ref 150–400)
RBC: 4.27 MIL/uL (ref 4.22–5.81)
RDW: 13.6 % (ref 11.5–15.5)
WBC: 8.7 10*3/uL (ref 4.0–10.5)

## 2017-03-10 SURGERY — LAPAROTOMY, EXPLORATORY
Anesthesia: General | Site: Abdomen

## 2017-03-10 MED ORDER — ONDANSETRON HCL 4 MG/2ML IJ SOLN
INTRAMUSCULAR | Status: DC | PRN
  Administered 2017-03-10: 4 mg via INTRAVENOUS

## 2017-03-10 MED ORDER — HYDROMORPHONE HCL 1 MG/ML IJ SOLN
0.2500 mg | INTRAMUSCULAR | Status: DC | PRN
  Administered 2017-03-10 (×4): 0.5 mg via INTRAVENOUS

## 2017-03-10 MED ORDER — 0.9 % SODIUM CHLORIDE (POUR BTL) OPTIME
TOPICAL | Status: DC | PRN
  Administered 2017-03-10 (×2): 1000 mL

## 2017-03-10 MED ORDER — MIDAZOLAM HCL 5 MG/5ML IJ SOLN
INTRAMUSCULAR | Status: DC | PRN
  Administered 2017-03-10 (×2): 1 mg via INTRAVENOUS

## 2017-03-10 MED ORDER — METHOCARBAMOL 1000 MG/10ML IJ SOLN
1000.0000 mg | Freq: Three times a day (TID) | INTRAVENOUS | Status: DC | PRN
  Administered 2017-03-10 – 2017-03-16 (×9): 1000 mg via INTRAVENOUS
  Filled 2017-03-10 (×11): qty 10

## 2017-03-10 MED ORDER — MIDAZOLAM HCL 2 MG/2ML IJ SOLN
INTRAMUSCULAR | Status: AC
  Filled 2017-03-10: qty 2

## 2017-03-10 MED ORDER — SODIUM CHLORIDE 0.9 % IV SOLN
0.1000 mg/kg/h | INTRAVENOUS | Status: DC
  Administered 2017-03-10: 2 mg/kg/h via INTRAVENOUS
  Filled 2017-03-10: qty 2

## 2017-03-10 MED ORDER — SUCCINYLCHOLINE CHLORIDE 200 MG/10ML IV SOSY
PREFILLED_SYRINGE | INTRAVENOUS | Status: DC | PRN
  Administered 2017-03-10: 140 mg via INTRAVENOUS

## 2017-03-10 MED ORDER — LIDOCAINE 5 % EX PTCH
1.0000 | MEDICATED_PATCH | Freq: Every day | CUTANEOUS | Status: DC | PRN
  Administered 2017-03-11: 1 via TRANSDERMAL
  Filled 2017-03-10: qty 1

## 2017-03-10 MED ORDER — ONDANSETRON HCL 4 MG/2ML IJ SOLN
INTRAMUSCULAR | Status: AC
  Filled 2017-03-10: qty 2

## 2017-03-10 MED ORDER — HYDROMORPHONE 1 MG/ML IV SOLN
INTRAVENOUS | Status: AC
  Administered 2017-03-10: 10:00:00
  Filled 2017-03-10: qty 25

## 2017-03-10 MED ORDER — DIPHENHYDRAMINE HCL 50 MG/ML IJ SOLN: 12.5000 mg | Freq: Four times a day (QID) | INTRAMUSCULAR | Status: DC | PRN

## 2017-03-10 MED ORDER — KETAMINE HCL 10 MG/ML IJ SOLN
INTRAMUSCULAR | Status: DC | PRN
  Administered 2017-03-10: 35 mg via INTRAVENOUS

## 2017-03-10 MED ORDER — DIPHENHYDRAMINE HCL 12.5 MG/5ML PO ELIX: 12.5000 mg | ORAL_SOLUTION | Freq: Four times a day (QID) | ORAL | Status: DC | PRN

## 2017-03-10 MED ORDER — SUGAMMADEX SODIUM 200 MG/2ML IV SOLN
INTRAVENOUS | Status: DC | PRN
  Administered 2017-03-10: 200 mg via INTRAVENOUS

## 2017-03-10 MED ORDER — PROPOFOL 10 MG/ML IV BOLUS
INTRAVENOUS | Status: AC
  Filled 2017-03-10: qty 40

## 2017-03-10 MED ORDER — DEXAMETHASONE SODIUM PHOSPHATE 10 MG/ML IJ SOLN
INTRAMUSCULAR | Status: AC
  Filled 2017-03-10: qty 1

## 2017-03-10 MED ORDER — CHLORHEXIDINE GLUCONATE CLOTH 2 % EX PADS: 6.0000 | MEDICATED_PAD | Freq: Once | CUTANEOUS | Status: DC

## 2017-03-10 MED ORDER — PANTOPRAZOLE SODIUM 40 MG IV SOLR
40.0000 mg | Freq: Every day | INTRAVENOUS | Status: DC
  Administered 2017-03-10 – 2017-03-14 (×5): 40 mg via INTRAVENOUS
  Filled 2017-03-10 (×5): qty 40

## 2017-03-10 MED ORDER — PHENYLEPHRINE HCL 10 MG/ML IJ SOLN
INTRAVENOUS | Status: DC | PRN
  Administered 2017-03-10: 25 ug/min via INTRAVENOUS

## 2017-03-10 MED ORDER — HYDROMORPHONE HCL 1 MG/ML IJ SOLN
INTRAMUSCULAR | Status: AC
  Filled 2017-03-10: qty 1

## 2017-03-10 MED ORDER — PROPOFOL 10 MG/ML IV BOLUS
INTRAVENOUS | Status: DC | PRN
  Administered 2017-03-10: 160 mg via INTRAVENOUS

## 2017-03-10 MED ORDER — ROCURONIUM BROMIDE 100 MG/10ML IV SOLN
INTRAVENOUS | Status: DC | PRN
  Administered 2017-03-10: 20 mg via INTRAVENOUS
  Administered 2017-03-10: 50 mg via INTRAVENOUS

## 2017-03-10 MED ORDER — OXYMETAZOLINE HCL 0.05 % NA SOLN
NASAL | Status: DC | PRN
  Administered 2017-03-10: 2 via NASAL

## 2017-03-10 MED ORDER — CEFAZOLIN SODIUM-DEXTROSE 2-4 GM/100ML-% IV SOLN
2.0000 g | INTRAVENOUS | Status: AC
  Administered 2017-03-10: 2 g via INTRAVENOUS

## 2017-03-10 MED ORDER — HYDROMORPHONE 1 MG/ML IV SOLN
INTRAVENOUS | Status: DC
  Administered 2017-03-10: 5.4 mg via INTRAVENOUS
  Administered 2017-03-10: 2.4 mg via INTRAVENOUS
  Administered 2017-03-11: 0.3 mg via INTRAVENOUS
  Administered 2017-03-11: 2.4 mg via INTRAVENOUS
  Administered 2017-03-11: 1.5 mg via INTRAVENOUS
  Administered 2017-03-11: 4.2 mg via INTRAVENOUS
  Administered 2017-03-11: 19:00:00 via INTRAVENOUS
  Administered 2017-03-11: 1.5 mg via INTRAVENOUS
  Administered 2017-03-11: 2.1 mg via INTRAVENOUS
  Administered 2017-03-11: 1.2 mg via INTRAVENOUS
  Administered 2017-03-11: 1.6 mg via INTRAVENOUS
  Administered 2017-03-12: 1.5 mg via INTRAVENOUS
  Administered 2017-03-12: 0 mg via INTRAVENOUS
  Filled 2017-03-10: qty 25

## 2017-03-10 MED ORDER — MENTHOL 3 MG MT LOZG
1.0000 | LOZENGE | OROMUCOSAL | Status: DC | PRN
  Administered 2017-03-10: 3 mg via ORAL
  Filled 2017-03-10 (×2): qty 9

## 2017-03-10 MED ORDER — ROCURONIUM BROMIDE 10 MG/ML (PF) SYRINGE
PREFILLED_SYRINGE | INTRAVENOUS | Status: AC
  Filled 2017-03-10: qty 5

## 2017-03-10 MED ORDER — KCL IN DEXTROSE-NACL 20-5-0.45 MEQ/L-%-% IV SOLN
INTRAVENOUS | Status: DC
  Administered 2017-03-10 – 2017-03-14 (×7): via INTRAVENOUS
  Filled 2017-03-10 (×11): qty 1000

## 2017-03-10 MED ORDER — EPHEDRINE SULFATE-NACL 50-0.9 MG/10ML-% IV SOSY
PREFILLED_SYRINGE | INTRAVENOUS | Status: DC | PRN
  Administered 2017-03-10: 5 mg via INTRAVENOUS
  Administered 2017-03-10: 10 mg via INTRAVENOUS

## 2017-03-10 MED ORDER — FENTANYL CITRATE (PF) 100 MCG/2ML IJ SOLN
INTRAMUSCULAR | Status: DC | PRN
  Administered 2017-03-10: 50 ug via INTRAVENOUS
  Administered 2017-03-10: 100 ug via INTRAVENOUS

## 2017-03-10 MED ORDER — LIDOCAINE 2% (20 MG/ML) 5 ML SYRINGE
INTRAMUSCULAR | Status: AC
  Filled 2017-03-10: qty 5

## 2017-03-10 MED ORDER — SODIUM CHLORIDE 0.9% FLUSH: 9.0000 mL | INTRAVENOUS | Status: DC | PRN

## 2017-03-10 MED ORDER — LACTATED RINGERS IV SOLN
INTRAVENOUS | Status: DC | PRN
  Administered 2017-03-10 (×2): via INTRAVENOUS

## 2017-03-10 MED ORDER — PROMETHAZINE HCL 25 MG/ML IJ SOLN: 6.2500 mg | INTRAMUSCULAR | Status: DC | PRN

## 2017-03-10 MED ORDER — CEFAZOLIN SODIUM-DEXTROSE 2-4 GM/100ML-% IV SOLN
INTRAVENOUS | Status: AC
  Filled 2017-03-10: qty 100

## 2017-03-10 MED ORDER — OXYMETAZOLINE HCL 0.05 % NA SOLN
NASAL | Status: AC
  Filled 2017-03-10: qty 15

## 2017-03-10 MED ORDER — HYDRALAZINE HCL 20 MG/ML IJ SOLN: 10.0000 mg | INTRAMUSCULAR | Status: DC | PRN

## 2017-03-10 MED ORDER — PHENYLEPHRINE 40 MCG/ML (10ML) SYRINGE FOR IV PUSH (FOR BLOOD PRESSURE SUPPORT)
PREFILLED_SYRINGE | INTRAVENOUS | Status: AC
  Filled 2017-03-10: qty 10

## 2017-03-10 MED ORDER — INFLUENZA VAC SPLIT HIGH-DOSE 0.5 ML IM SUSY
0.5000 mL | PREFILLED_SYRINGE | INTRAMUSCULAR | Status: DC
  Filled 2017-03-10: qty 0.5

## 2017-03-10 MED ORDER — SUCCINYLCHOLINE CHLORIDE 200 MG/10ML IV SOSY
PREFILLED_SYRINGE | INTRAVENOUS | Status: AC
  Filled 2017-03-10: qty 10

## 2017-03-10 MED ORDER — NALOXONE HCL 0.4 MG/ML IJ SOLN: 0.4000 mg | INTRAMUSCULAR | Status: DC | PRN

## 2017-03-10 MED ORDER — ONDANSETRON HCL 4 MG/2ML IJ SOLN
4.0000 mg | Freq: Four times a day (QID) | INTRAMUSCULAR | Status: DC | PRN
  Administered 2017-03-11 (×3): 4 mg via INTRAVENOUS
  Filled 2017-03-10 (×3): qty 2

## 2017-03-10 MED ORDER — ALBUMIN HUMAN 5 % IV SOLN
INTRAVENOUS | Status: DC | PRN
  Administered 2017-03-10: 09:00:00 via INTRAVENOUS

## 2017-03-10 MED ORDER — SUGAMMADEX SODIUM 200 MG/2ML IV SOLN
INTRAVENOUS | Status: AC
  Filled 2017-03-10: qty 2

## 2017-03-10 MED ORDER — ENOXAPARIN SODIUM 40 MG/0.4ML ~~LOC~~ SOLN
40.0000 mg | SUBCUTANEOUS | Status: DC
  Administered 2017-03-11 – 2017-03-16 (×6): 40 mg via SUBCUTANEOUS
  Filled 2017-03-10 (×6): qty 0.4

## 2017-03-10 MED ORDER — PHENYLEPHRINE 40 MCG/ML (10ML) SYRINGE FOR IV PUSH (FOR BLOOD PRESSURE SUPPORT)
PREFILLED_SYRINGE | INTRAVENOUS | Status: DC | PRN
  Administered 2017-03-10 (×2): 80 ug via INTRAVENOUS

## 2017-03-10 MED ORDER — DEXAMETHASONE SODIUM PHOSPHATE 10 MG/ML IJ SOLN
INTRAMUSCULAR | Status: DC | PRN
  Administered 2017-03-10: 10 mg via INTRAVENOUS

## 2017-03-10 MED ORDER — LIDOCAINE HCL (CARDIAC) 20 MG/ML IV SOLN
INTRAVENOUS | Status: DC | PRN
  Administered 2017-03-10: 100 mg via INTRAVENOUS

## 2017-03-10 MED ORDER — FENTANYL CITRATE (PF) 250 MCG/5ML IJ SOLN
INTRAMUSCULAR | Status: AC
  Filled 2017-03-10: qty 5

## 2017-03-10 SURGICAL SUPPLY — 39 items
BLADE CLIPPER SURG (BLADE) ×3 IMPLANT
CANISTER SUCT 3000ML PPV (MISCELLANEOUS) ×3 IMPLANT
CHLORAPREP W/TINT 26ML (MISCELLANEOUS) ×3 IMPLANT
COVER SURGICAL LIGHT HANDLE (MISCELLANEOUS) ×3 IMPLANT
DRAPE LAPAROSCOPIC ABDOMINAL (DRAPES) ×3 IMPLANT
DRAPE WARM FLUID 44X44 (DRAPE) ×3 IMPLANT
DRSG OPSITE POSTOP 4X8 (GAUZE/BANDAGES/DRESSINGS) ×3 IMPLANT
ELECT BLADE 6.5 EXT (BLADE) ×3 IMPLANT
ELECT CAUTERY BLADE 6.4 (BLADE) ×3 IMPLANT
ELECT REM PT RETURN 9FT ADLT (ELECTROSURGICAL) ×3
ELECTRODE REM PT RTRN 9FT ADLT (ELECTROSURGICAL) ×1 IMPLANT
GLOVE BIO SURGEON STRL SZ7 (GLOVE) ×3 IMPLANT
GLOVE BIO SURGEON STRL SZ8 (GLOVE) ×3 IMPLANT
GLOVE BIOGEL PI IND STRL 7.5 (GLOVE) ×1 IMPLANT
GLOVE BIOGEL PI IND STRL 8 (GLOVE) ×2 IMPLANT
GLOVE BIOGEL PI IND STRL 8.5 (GLOVE) ×1 IMPLANT
GLOVE BIOGEL PI INDICATOR 7.5 (GLOVE) ×2
GLOVE BIOGEL PI INDICATOR 8 (GLOVE) ×4
GLOVE BIOGEL PI INDICATOR 8.5 (GLOVE) ×2
GLOVE ECLIPSE 8.0 STRL XLNG CF (GLOVE) ×3 IMPLANT
GOWN STRL REUS W/ TWL LRG LVL3 (GOWN DISPOSABLE) ×1 IMPLANT
GOWN STRL REUS W/ TWL XL LVL3 (GOWN DISPOSABLE) ×2 IMPLANT
GOWN STRL REUS W/TWL LRG LVL3 (GOWN DISPOSABLE) ×2
GOWN STRL REUS W/TWL XL LVL3 (GOWN DISPOSABLE) ×4
KIT BASIN OR (CUSTOM PROCEDURE TRAY) ×3 IMPLANT
KIT ROOM TURNOVER OR (KITS) ×3 IMPLANT
NS IRRIG 1000ML POUR BTL (IV SOLUTION) ×6 IMPLANT
PACK GENERAL/GYN (CUSTOM PROCEDURE TRAY) ×3 IMPLANT
PAD ARMBOARD 7.5X6 YLW CONV (MISCELLANEOUS) ×3 IMPLANT
SPONGE LAP 18X18 X RAY DECT (DISPOSABLE) ×3 IMPLANT
STAPLER VISISTAT 35W (STAPLE) ×3 IMPLANT
SUCTION POOLE TIP (SUCTIONS) ×3 IMPLANT
SUT PDS AB 1 TP1 96 (SUTURE) ×6 IMPLANT
SUT SILK 2 0 SH CR/8 (SUTURE) ×3 IMPLANT
SUT SILK 2 0 TIES 10X30 (SUTURE) ×3 IMPLANT
SUT SILK 3 0 SH CR/8 (SUTURE) ×3 IMPLANT
SUT SILK 3 0 TIES 10X30 (SUTURE) ×3 IMPLANT
TOWEL OR 17X26 10 PK STRL BLUE (TOWEL DISPOSABLE) ×3 IMPLANT
TRAY FOLEY W/METER SILVER 16FR (SET/KITS/TRAYS/PACK) ×3 IMPLANT

## 2017-03-10 NOTE — H&P (Signed)
  Kent [name] 01/14/2017 11:42 AM Location: [facility] Patient #: [id number] DOB: Oct 26, 1940 Married / Language: English / Race: White Male   History of Present Illness Kent Neri E. Grandville Silos MD; 01/14/2017 11:54 AM) The patient is a 76 year old male who presents with small bowel obstruction. Kent [name] presents for possible small bowel obstruction. He was admitted to the hospital again a couple weeks ago. His obstruction resolved with bowel rest and IV fluids. He has had multiple recurrences. Previous abdominal surgeries have included partial colectomy, incisional hernia at previous stoma site, incisional hernia repair with mesh, and laparoscopic cholecystectomy. At home he has been eating better but still has some crampy intermittent abdominal pain.   Allergies (Armen Eulas Post, CMA; 01/14/2017 11:45 AM) ASPIRIN  No Known Drug Allergies 01/14/2017 (Marked as Inactive) CEPHALEXIN  Vicodin *ANALGESICS - OPIOID*  Naprosyn *ANALGESICS - ANTI-INFLAMMATORY*  Allergies Reconciled   Medication History (Armen Glenn, CMA; 01/14/2017 11:48 AM) PreserVision AREDS 2 (Oral) Active. Probiotic (Oral) Active. Ascorbic Acid (100MG  Tablet, Oral) Active. Vitamins-Lipotropics ER (Oral) Active. Pantoprazole Sodium (40MG  Tablet DR, Oral) Active. PriLOSEC OTC (20MG  Tablet DR, Oral daily) Active. Methocarbamol (500MG  Tablet, Oral two times daily) Active. Multi Vitamin Daily (Oral) Active. Omeprazole (20MG  Capsule DR, Oral) Active. Vitamin C (1000MG  Tablet, Oral) Active. Vitamin E (1000UNIT Tablet, Oral) Active. Claritin-D 24 Hour (10-240MG  Tablet ER 24HR, Oral) Active. Medications Reconciled  Vitals (Armen Glenn CMA; 01/14/2017 11:43 AM) 01/14/2017 11:42 AM Weight: 162.25 lb Height: 70in Body Surface Area: 1.91 m Body Mass Index: 23.28 kg/m  Temp.: 97.84F  Pulse: 90 (Regular)  P.OX: 97% (Room air) BP: 126/62 (Sitting, Left Arm, Standard)       Physical  Exam Kent Neri E. Grandville Silos MD; 01/14/2017 11:56 AM) General Note: no distress   Head and Neck Note: normocephalic, supple   Eye Note: PERL   Chest and Lung Exam Note: CTA B   Cardiovascular Note: RRR no murmur   Abdomen Note: Soft, nondistended, midline incision intact, right lower quadrant incision intact, no significant tenderness, bowel sounds are normal, small bruise from recent Lovenox injection R abdomen   Neurologic Note: Awake and alert, oriented, normal gait   Musculoskeletal Note: No deformity     Assessment & Plan Kent Neri E. Grandville Silos MD; 01/14/2017 11:56 AM) SMALL BOWEL OBSTRUCTION, PARTIAL (K56.600) Impression: This is recurrent with multiple episodes. During his last hospitalization and again today I discussed the option of scheduling an elective exploratory laparotomy and lysis of adhesions. The goal will be to decrease the recurrence of these obstructions. I discussed the procedure, risks, and benefits with him and he is agreeable. He has become quite frustrated with this process. He has also discussed it with his primary care physician.  9/18 update - no changes in exam. Labs reviewed. Ready for surgery.  Georganna Skeans, MD, MPH, FACS Trauma: [phone] General Surgery: [phone]

## 2017-03-10 NOTE — Anesthesia Postprocedure Evaluation (Signed)
Anesthesia Post Note  Patient: Kent [name]  Procedure(s) Performed: Procedure(s) (LRB): EXPLORATORY LAPAROTOMY (N/A) LYSIS OF ADHESION (N/A)     Patient location during evaluation: PACU Anesthesia Type: General Level of consciousness: sedated Pain management: pain level controlled Vital Signs Assessment: post-procedure vital signs reviewed and stable Respiratory status: spontaneous breathing and respiratory function stable Cardiovascular status: stable Postop Assessment: no apparent nausea or vomiting Anesthetic complications: no    Last Vitals:  Vitals:   03/10/17 1425 03/10/17 1547  BP: 136/74   Pulse: 98   Resp: 16 18  Temp: 36.5 C   SpO2: 100% 96%    Last Pain:  Vitals:   03/10/17 1547  TempSrc:   PainSc: 4                  Wisam Siefring DANIEL

## 2017-03-10 NOTE — Transfer of Care (Signed)
Immediate Anesthesia Transfer of Care Note  Patient: Kent [name]  Procedure(s) Performed: Procedure(s): EXPLORATORY LAPAROTOMY (N/A) LYSIS OF ADHESION (N/A)  Patient Location: PACU  Anesthesia Type:General  Level of Consciousness: awake, alert  and oriented  Airway & Oxygen Therapy: Patient Spontanous Breathing and Patient connected to nasal cannula oxygen  Post-op Assessment: Report given to RN and Post -op Vital signs reviewed and stable  Post vital signs: Reviewed and stable  Last Vitals:  Vitals:   03/10/17 0702  BP: (!) 156/71  Pulse: 87  Resp: 18  Temp: 36.7 C  SpO2: 100%    Last Pain:  Vitals:   03/10/17 0702  TempSrc: Oral  PainSc: 3       Patients Stated Pain Goal: 2 (29/24/46 2863)  Complications: No apparent anesthesia complications

## 2017-03-10 NOTE — Op Note (Signed)
03/10/2017  9:42 AM  PATIENT:  Kent [name]  76 y.o. male  PRE-OPERATIVE DIAGNOSIS:  recurrent partial small bowel obstruction  POST-OPERATIVE DIAGNOSIS:  recurrent partial small bowel obstruction  PROCEDURE:  Procedure(s): EXPLORATORY LAPAROTOMY LYSIS OF ADHESION 95 MINUTES  SURGEON:  Georganna Skeans, M.D.  ASSISTANTS: Donnie Mesa, M.D.   ANESTHESIA:   general  EBL:  Total I/O In: 1250 [I.V.:1000; IV Piggyback:250] Out: 550 [Urine:525; Blood:25]  BLOOD ADMINISTERED:none  DRAINS: Nasogastric Tube   SPECIMEN:  No Specimen  DISPOSITION OF SPECIMEN:  N/A  COUNTS:  YES  DICTATION: .Dragon Dictation Procedure in detail: [name] presents for lysis of adhesions for chronic recurrent partial small bowel obstructions. He received intravenous antibiotics. Informed consent was obtained. He was brought to the operating room. General endotracheal anesthesia was administered by the anesthesia staff. Foley catheter was placed by nursing. His abdomen was prepped and draped in a sterile fashion. We did time out procedure. Midline incision was made and continued up to just above his umbilicus. Subcutaneous tissues were dissected mL enhancing anterior fascia. This was gradually divided sharply and carefully due to his previous incisional hernia repair with mesh. In a meticulous fashion, we were able to go through the mesh and into the abdomen. Fortunately, there were no adhesions right along the midline directly. The fascia was opened to the length of the incision. We then explored the abdomen and located a large amount of small bowel stuck up to the underside of the mesh in the right lower quadrant area. We began a meticulous 95 minute adhesiolysis. The first rib to gradually detached the grouping of small bowel from the underside of the mesh in the right lower quadrant. We then were able to run the bowel from the ligament of Treitz down to the ileocolonic anastomosis which is on the left side.  There were 3 areas of significant interloop small bowel adhesions, the largest and worst was where the bowel had been stuck up to the mesh. Gradual meticulous dissection was able to free up the bowel completely. There was one small serosal tear which was repaired with a single 3-0 silk stitch. The 2 other areas of significant interloop adhesions were also taken down. Total adhesiolysis was 95 minutes. There were no enterotomies. We then ran the bowel again rechecked all of the areas and there was no evidence of perforation or other complication. The bowel was all viable. The bowel was replaced in anatomic position in the abdomen. Abdomen was irrigated and hemostasis was ensured. The fascia was then closed with 2 lengths of running #1 looped PDS tied in the middle. Skin was closed with staples and a sterile dressing was applied. All counts were correct. He tolerated the procedure well without apparent complication and was taken to recovery room in stable condition. PATIENT DISPOSITION:  PACU - hemodynamically stable.   Delay start of Pharmacological VTE agent (>24hrs) due to surgical blood loss or risk of bleeding:  no  Georganna Skeans, MD, MPH, FACS Pager: [phone]  9/18/20189:42 AM

## 2017-03-10 NOTE — Interval H&P Note (Signed)
History and Physical Interval Note:  03/10/2017 6:52 AM  Kent [name]  has presented today for surgery, with the diagnosis of recurrent partial small bowel obstruction  The various methods of treatment have been discussed with the patient and family. After consideration of risks, benefits and other options for treatment, the patient has consented to  Procedure(s): EXPLORATORY LAPAROTOMY (N/A) LYSIS OF ADHESION (N/A) as a surgical intervention .  The patient's history has been reviewed, patient examined, no change in status, stable for surgery.  I have reviewed the patient's chart and labs.  Questions were answered to the patient's satisfaction.     [name]

## 2017-03-10 NOTE — Anesthesia Procedure Notes (Signed)
Procedure Name: Intubation Date/Time: 03/10/2017 7:37 AM Performed by: Candis Shine Pre-anesthesia Checklist: Patient identified, Emergency Drugs available, Suction available and Patient being monitored Patient Re-evaluated:Patient Re-evaluated prior to induction Oxygen Delivery Method: Circle System Utilized Preoxygenation: Pre-oxygenation with 100% oxygen Induction Type: IV induction, Rapid sequence and Cricoid Pressure applied Laryngoscope Size: Mac and 4 Grade View: Grade I Tube type: Oral Tube size: 7.5 mm Number of attempts: 1 Airway Equipment and Method: Stylet Placement Confirmation: ETT inserted through vocal cords under direct vision,  positive ETCO2 and breath sounds checked- equal and bilateral Secured at: 23 cm Tube secured with: Tape Dental Injury: Teeth and Oropharynx as per pre-operative assessment

## 2017-03-11 ENCOUNTER — Encounter (HOSPITAL_COMMUNITY): Payer: Self-pay | Admitting: General Surgery

## 2017-03-11 LAB — BASIC METABOLIC PANEL
Anion gap: 6 (ref 5–15)
BUN: 9 mg/dL (ref 6–20)
CHLORIDE: 102 mmol/L (ref 101–111)
CO2: 29 mmol/L (ref 22–32)
CREATININE: 0.86 mg/dL (ref 0.61–1.24)
Calcium: 9 mg/dL (ref 8.9–10.3)
GFR calc Af Amer: >60 mL/min (ref >60)
GFR calc non Af Amer: >60 mL/min (ref >60)
GLUCOSE: 140 mg/dL — AB (ref 65–99)
Potassium: 4.7 mmol/L (ref 3.5–5.1)
Sodium: 137 mmol/L (ref 135–145)

## 2017-03-11 LAB — CBC
HEMATOCRIT: 35.6 % — AB (ref 39.0–52.0)
HEMOGLOBIN: 11.4 g/dL — AB (ref 13.0–17.0)
MCH: 27.9 pg (ref 26.0–34.0)
MCHC: 32 g/dL (ref 30.0–36.0)
MCV: 87 fL (ref 78.0–100.0)
Platelets: 171 10*3/uL (ref 150–400)
RBC: 4.09 MIL/uL — ABNORMAL LOW (ref 4.22–5.81)
RDW: 13.6 % (ref 11.5–15.5)
WBC: 10.2 10*3/uL (ref 4.0–10.5)

## 2017-03-11 MED ORDER — PROCHLORPERAZINE EDISYLATE 5 MG/ML IJ SOLN
10.0000 mg | Freq: Four times a day (QID) | INTRAMUSCULAR | Status: DC | PRN
  Administered 2017-03-11 – 2017-03-15 (×2): 10 mg via INTRAVENOUS
  Filled 2017-03-11 (×2): qty 2

## 2017-03-11 NOTE — Progress Notes (Addendum)
1 Day Post-Op   Subjective/Chief Complaint: Fair pain control. NGT sump filter came off spilling fluid overnight   Objective: Vital signs in last 24 hours: Temp:  [97.2 F (36.2 C)-98.2 F (36.8 C)] 98.2 F (36.8 C) (09/19 0532) Pulse Rate:  [76-98] 82 (09/19 0532) Resp:  [12-25] 15 (09/19 0821) BP: (136-188)/(64-122) 155/77 (09/19 0532) SpO2:  [96 %-100 %] 99 % (09/19 0821) Last BM Date: 03/09/17  Intake/Output from previous day: 09/18 0701 - 09/19 0700 In: 3276.7 [I.V.:2436.7; NG/GT:30; IV Piggyback:310] Out: 5176 [Urine:3525; Emesis/NG output:600; Blood:25] Intake/Output this shift: No intake/output data recorded.  General appearance: cooperative Resp: clear to auscultation bilaterally Cardio: regular rate and rhythm GI: soft, dry stain on dressing, quiet Extremities: PAS BLE  Lab Results:   Recent Labs  03/10/17 1443 03/11/17 0353  WBC 8.7 10.2  HGB 12.1* 11.4*  HCT 37.0* 35.6*  PLT 176 171   BMET  Recent Labs  03/10/17 1443 03/11/17 0353  NA  --  137  K  --  4.7  CL  --  102  CO2  --  29  GLUCOSE  --  140*  BUN  --  9  CREATININE 0.95 0.86  CALCIUM  --  9.0   PT/INR No results for input(s): LABPROT, INR in the last 72 hours. ABG No results for input(s): PHART, HCO3 in the last 72 hours.  Invalid input(s): PCO2, PO2  Studies/Results: No results found.  Anti-infectives: Anti-infectives    Start     Dose/Rate Route Frequency Ordered Stop   03/10/17 0654  ceFAZolin (ANCEF) 2-4 GM/100ML-% IVPB    Comments:  Forte, Lindsi   : cabinet override      03/10/17 0654 03/10/17 0745   03/10/17 0650  ceFAZolin (ANCEF) IVPB 2g/100 mL premix     2 g 200 mL/hr over 30 Minutes Intravenous On call to O.R. 03/10/17 1607 03/10/17 0815      Assessment/Plan: s/p Procedure(s): EXPLORATORY LAPAROTOMY (N/A) LYSIS OF ADHESION (N/A) POD#1 - await return of bowel function Chronic back pain - robaxin IV, home PO narcs held, PCA FEN - ice chips, lytes OK, D/C  foley later today or in AM VTE - Lovenox Dispo - above, mobilize   LOS: 1 day    Minnah Llamas E 03/11/2017

## 2017-03-12 LAB — CBC
HCT: 39 % (ref 39.0–52.0)
Hemoglobin: 12.6 g/dL — ABNORMAL LOW (ref 13.0–17.0)
MCH: 28.4 pg (ref 26.0–34.0)
MCHC: 32.3 g/dL (ref 30.0–36.0)
MCV: 88 fL (ref 78.0–100.0)
PLATELETS: 186 10*3/uL (ref 150–400)
RBC: 4.43 MIL/uL (ref 4.22–5.81)
RDW: 13.3 % (ref 11.5–15.5)
WBC: 8.4 10*3/uL (ref 4.0–10.5)

## 2017-03-12 LAB — BASIC METABOLIC PANEL
ANION GAP: 7 (ref 5–15)
BUN: 7 mg/dL (ref 6–20)
CALCIUM: 9.2 mg/dL (ref 8.9–10.3)
CO2: 30 mmol/L (ref 22–32)
CREATININE: 0.85 mg/dL (ref 0.61–1.24)
Chloride: 100 mmol/L — ABNORMAL LOW (ref 101–111)
Glucose, Bld: 134 mg/dL — ABNORMAL HIGH (ref 65–99)
Potassium: 4 mmol/L (ref 3.5–5.1)
Sodium: 137 mmol/L (ref 135–145)

## 2017-03-12 MED ORDER — HYDROMORPHONE HCL 1 MG/ML IJ SOLN
1.0000 mg | INTRAMUSCULAR | Status: DC | PRN
  Administered 2017-03-12 (×4): 1 mg via INTRAVENOUS
  Filled 2017-03-12 (×4): qty 1

## 2017-03-12 NOTE — Progress Notes (Signed)
2 Days Post-Op   Subjective/Chief Complaint: Inadequate pain control with PCA, no flatus   Objective: Vital signs in last 24 hours: Temp:  [97.6 F (36.4 C)-100.3 F (37.9 C)] 97.6 F (36.4 C) (09/20 1059) Pulse Rate:  [57-113] 90 (09/20 1059) Resp:  [13-22] 13 (09/20 1059) BP: (125-166)/(66-91) 125/66 (09/20 1059) SpO2:  [93 %-100 %] 97 % (09/20 1059) Last BM Date: 03/09/17  Intake/Output from previous day: 09/19 0701 - 09/20 0700 In: 2588.7 [P.O.:30; I.V.:2438.7; IV Piggyback:120] Out: 4400 [Urine:3900; Emesis/NG output:500] Intake/Output this shift: Total I/O In: -  Out: 400 [Urine:400]  General appearance: cooperative Resp: clear to auscultation bilaterally GI: Soft, dry state and undressing, a few bowel sounds, moderate distention Correction: Dry stain on dressing Lab Results:   Recent Labs  03/11/17 0353 03/12/17 0444  WBC 10.2 8.4  HGB 11.4* 12.6*  HCT 35.6* 39.0  PLT 171 186   BMET  Recent Labs  03/11/17 0353 03/12/17 0444  NA 137 137  K 4.7 4.0  CL 102 100*  CO2 29 30  GLUCOSE 140* 134*  BUN 9 7  CREATININE 0.86 0.85  CALCIUM 9.0 9.2   PT/INR No results for input(s): LABPROT, INR in the last 72 hours. ABG No results for input(s): PHART, HCO3 in the last 72 hours.  Invalid input(s): PCO2, PO2  Studies/Results: No results found.  Anti-infectives: Anti-infectives    Start     Dose/Rate Route Frequency Ordered Stop   03/10/17 0654  ceFAZolin (ANCEF) 2-4 GM/100ML-% IVPB    Comments:  Forte, Lindsi   : cabinet override      03/10/17 0654 03/10/17 0745   03/10/17 0650  ceFAZolin (ANCEF) IVPB 2g/100 mL premix     2 g 200 mL/hr over 30 Minutes Intravenous On call to O.R. 03/10/17 0650 03/10/17 0815      Assessment/Plan: POD#2 - await return of bowel function Chronic back pain - robaxin IV, home PO narcs held, PCA FEN - ice chips, DC PCA, Dilaudid PRN VTE - Lovenox Dispo - above, mobilize. I spoke with his wife.  LOS: 2 days     Sontee Desena E 03/12/2017

## 2017-03-13 MED ORDER — OXYCODONE HCL ER 10 MG PO T12A
20.0000 mg | EXTENDED_RELEASE_TABLET | Freq: Two times a day (BID) | ORAL | Status: DC
  Administered 2017-03-13 – 2017-03-16 (×6): 20 mg via ORAL
  Filled 2017-03-13 (×6): qty 2

## 2017-03-13 NOTE — Care Management Important Message (Signed)
Important Message  Patient Details  Name: Kent [name] MRN: [id number] Date of Birth: 11/11/1940   Medicare Important Message Given:  Yes    [name] 03/13/2017, 12:15 PM

## 2017-03-14 MED ORDER — OXYCODONE HCL 5 MG PO TABS
5.0000 mg | ORAL_TABLET | ORAL | Status: DC | PRN
Start: 2017-03-14 — End: 2017-03-16
  Administered 2017-03-14 – 2017-03-16 (×4): 10 mg via ORAL
  Filled 2017-03-14 (×4): qty 2

## 2017-03-14 NOTE — Progress Notes (Signed)
4 Days Post-Op   Subjective/Chief Complaint: tol clears   Objective: Vital signs in last 24 hours: Temp:  [97.9 F (36.6 C)-98.4 F (36.9 C)] 97.9 F (36.6 C) (09/21 2036) Pulse Rate:  [84-120] 84 (09/21 2036) Resp:  [16] 16 (09/21 2036) BP: (115-118)/(55-68) 115/55 (09/21 2036) SpO2:  [100 %] 100 % (09/21 2036) Last BM Date: 03/13/17  Intake/Output from previous day: 09/21 0701 - 09/22 0700 In: 2590 [P.O.:660; I.V.:1930] Out: 300 [Urine:300] Intake/Output this shift: Total I/O In: 1040 [P.O.:60; I.V.:980] Out: -   General appearance: cooperative Resp: clear to auscultation bilaterally GI: soft, +BS, incision CDI, small ecchymosis inferiorly  Lab Results:   Recent Labs  03/12/17 0444  WBC 8.4  HGB 12.6*  HCT 39.0  PLT 186   BMET  Recent Labs  03/12/17 0444  NA 137  K 4.0  CL 100*  CO2 30  GLUCOSE 134*  BUN 7  CREATININE 0.85  CALCIUM 9.2   PT/INR No results for input(s): LABPROT, INR in the last 72 hours. ABG No results for input(s): PHART, HCO3 in the last 72 hours.  Invalid input(s): PCO2, PO2  Studies/Results: No results found.  Anti-infectives: Anti-infectives    Start     Dose/Rate Route Frequency Ordered Stop   03/10/17 0654  ceFAZolin (ANCEF) 2-4 GM/100ML-% IVPB    Comments:  Forte, Lindsi   : cabinet override      03/10/17 0654 03/10/17 0745   03/10/17 0650  ceFAZolin (ANCEF) IVPB 2g/100 mL premix     2 g 200 mL/hr over 30 Minutes Intravenous On call to O.R. 03/10/17 0650 03/10/17 0815      Assessment/Plan: POD#3 - bowel function returning Chronic back pain - robaxin IV, home PO narcs resumed FEN - fulls VTE - Lovenox Dispo - above, mobilize  LOS: 4 days    Dorathea Faerber E 03/14/2017

## 2017-03-15 MED ORDER — PANTOPRAZOLE SODIUM 40 MG PO TBEC
40.0000 mg | DELAYED_RELEASE_TABLET | Freq: Every day | ORAL | Status: DC
  Administered 2017-03-15: 40 mg via ORAL
  Filled 2017-03-15: qty 1

## 2017-03-15 NOTE — Progress Notes (Signed)
5 Days Post-Op   Subjective/Chief Complaint: Walked, sore. tol fulls  Objective: Vital signs in last 24 hours: Temp:  [98.1 F (36.7 C)-98.3 F (36.8 C)] 98.1 F (36.7 C) (09/23 0434) Pulse Rate:  [71-89] 72 (09/23 0434) Resp:  [16-18] 18 (09/23 0434) BP: (113-150)/(63-66) 113/64 (09/23 0434) SpO2:  [96 %-100 %] 96 % (09/23 0434) Last BM Date: 03/13/17  Intake/Output from previous day: 09/22 0701 - 09/23 0700 In: 320 [P.O.:320] Out: 2100 [Urine:2100] Intake/Output this shift: Total I/O In: -  Out: 1900 [Urine:1900]  General appearance: cooperative Resp: clear to auscultation bilaterally Cardio: regular rate and rhythm GI: soft, contusion lower abd, incision OTX CDI  Lab Results:  No results for input(s): WBC, HGB, HCT, PLT in the last 72 hours. BMET No results for input(s): NA, K, CL, CO2, GLUCOSE, BUN, CREATININE, CALCIUM in the last 72 hours. PT/INR No results for input(s): LABPROT, INR in the last 72 hours. ABG No results for input(s): PHART, HCO3 in the last 72 hours.  Invalid input(s): PCO2, PO2  Studies/Results: No results found.  Anti-infectives: Anti-infectives    Start     Dose/Rate Route Frequency Ordered Stop   03/10/17 0654  ceFAZolin (ANCEF) 2-4 GM/100ML-% IVPB    Comments:  Forte, Lindsi   : cabinet override      03/10/17 0654 03/10/17 0745   03/10/17 0650  ceFAZolin (ANCEF) IVPB 2g/100 mL premix     2 g 200 mL/hr over 30 Minutes Intravenous On call to O.R. 03/10/17 0650 03/10/17 0815      Assessment/Plan: POD#4 - bowel function returning Chronic back pain - robaxin IV, home PO narcs resumed FEN - advance to regular VTE - Lovenox Dispo - above, mobilize  LOS: 5 days    Farron Lafond E 03/15/2017

## 2017-03-16 MED ORDER — ASPIRIN EC 325 MG PO TBEC: 325.0000 mg | DELAYED_RELEASE_TABLET | Freq: Every day | ORAL | Status: DC

## 2017-03-16 NOTE — Progress Notes (Signed)
Discharge instructions reviewed with pt.  Pt verbalized understanding and had no questions.  Pt discharged in stable condition via wheelchair with wife.  Kent [name]   

## 2017-03-16 NOTE — Discharge Instructions (Signed)
[facility]      [facility] [phone]  OPEN ABDOMINAL SURGERY: POST OP INSTRUCTIONS  Always review your discharge instruction sheet given to you by the facility where your surgery was performed.  IF YOU HAVE DISABILITY OR FAMILY LEAVE FORMS, YOU MUST BRING THEM TO THE OFFICE FOR PROCESSING.  PLEASE DO NOT GIVE THEM TO YOUR DOCTOR.  1. A prescription for pain medication may be given to you upon discharge.  Take your pain medication as prescribed, if needed.  If narcotic pain medicine is not needed, then you may take acetaminophen (Tylenol) or ibuprofen (Advil) as needed. 2. Take your usually prescribed medications unless otherwise directed. 3. If you need a refill on your pain medication, please contact your pharmacy. They will contact our office to request authorization.  Prescriptions will not be filled after 5pm or on week-ends. 4. You should follow a light diet the first few days after arrival home, such as soup and crackers, pudding, etc.unless your doctor has advised otherwise. A high-fiber, low fat diet can be resumed as tolerated.   Be sure to include lots of fluids daily. Most patients will experience some swelling and bruising on the chest and neck area.  Ice packs will help.  Swelling and bruising can take several days to resolve 5. Most patients will experience some swelling and bruising in the area of the incision. Ice pack will help. Swelling and bruising can take several days to resolve..  6. It is common to experience some constipation if taking pain medication after surgery.  Increasing fluid intake and taking a stool softener will usually help or prevent this problem from occurring.  A mild laxative (Milk of Magnesia or Miralax) should be taken according to package directions if there are no bowel movements after 48 hours. 7.  You may have steri-strips (small skin tapes) in place directly over the incision.  These strips should be left on the skin for 7-10 days.  If your  surgeon used skin glue on the incision, you may shower in 24 hours.  The glue will flake off over the next 2-3 weeks.  Any sutures or staples will be removed at the office during your follow-up visit. You may find that a light gauze bandage over your incision may keep your staples from being rubbed or pulled. You may shower and replace the bandage daily. 8. ACTIVITIES:  You may resume regular (light) daily activities beginning the next day--such as daily self-care, walking, climbing stairs--gradually increasing activities as tolerated.  You may have sexual intercourse when it is comfortable.  Refrain from any heavy lifting or straining until approved by your doctor. a. You may drive when you no longer are taking prescription pain medication, you can comfortably wear a seatbelt, and you can safely maneuver your car and apply brakes b. Return to Work: ___________________________________ 9. You should see your doctor in the office for a follow-up appointment approximately two weeks after your surgery.  Make sure that you call for this appointment within a day or two after you arrive home to insure a convenient appointment time. OTHER INSTRUCTIONS:  _____________________________________________________________ _____________________________________________________________  WHEN TO CALL YOUR DOCTOR: 1. Fever over 101.0 2. Inability to urinate 3. Nausea and/or vomiting 4. Extreme swelling or bruising 5. Continued bleeding from incision. 6. Increased pain, redness, or drainage from the incision. 7. Difficulty swallowing or breathing 8. Muscle cramping or spasms. 9. Numbness or tingling in hands or feet or around lips.  The clinic staff is available to   answer your questions during regular business hours.  Please don't hesitate to call and ask to speak to one of the nurses if you have concerns.  For further questions, please visit [email or web address]   

## 2017-03-16 NOTE — Discharge Summary (Signed)
Physician Discharge Summary  Patient ID: Kent [name] MRN: [id number] DOB/AGE: 02/27/1941 76 y.o.  Admit date: 03/10/2017 Discharge date: 03/16/2017  Admission Diagnoses:recurrent SBO  Discharge Diagnoses: S/P lysis of adhesions Active Problems:   S/P exploratory laparotomy   Discharged Condition: good  Hospital Course: [name] underwent extensive POA. Post op his bowel function gradually returned and his diet was advanced. Pain control transitioned to PO. He had a mild L forearm thrombophlebitis so he will take ASA for 2d at home.  Consults: None  Significant Diagnostic Studies: no  Treatments: surgery: .   Discharge Exam: Blood pressure (!) 143/68, pulse 86, temperature 98.3 F (36.8 C), temperature source Oral, resp. rate 18, height 5' 11.5" (1.816 m), weight 72.2 kg (159 lb 1.6 oz), SpO2 97 %. General appearance: cooperative Resp: clear to auscultation bilaterally Cardio: regular rate and rhythm GI: incision CDI, soft  Disposition: 01-Home or Self Care   Allergies as of 03/16/2017   No Known Allergies     Medication List    TAKE these medications   aspirin EC 325 MG tablet Take 1 tablet (325 mg total) by mouth daily.   CLARITIN-D 24 HOUR PO Take 1 tablet by mouth daily as needed (for allergies/congestion.).   guaiFENesin 600 MG 12 hr tablet Commonly known as:  MUCINEX Take 600-1,200 mg by mouth 2 (two) times daily as needed (for allergies/congestion.).   HYDROcodone-acetaminophen 10-325 MG tablet Commonly known as:  NORCO Take 1 tablet by mouth every 8 (eight) hours.   lidocaine 5 % Commonly known as:  LIDODERM Place 1 patch onto the skin daily as needed (for back pain). Remove & Discard patch within 12 hours or as directed by MD   methocarbamol 500 MG tablet Commonly known as:  ROBAXIN Take 1,000 mg by mouth every 12 (twelve) hours.   omeprazole 20 MG capsule Commonly known as:  PRILOSEC Take 20 mg by mouth at bedtime.   oxyCODONE 20 MG 12 hr  tablet Commonly known as:  OXYCONTIN Take 20 mg by mouth every 12 (twelve) hours.   PRESERVISION AREDS 2 PO Take 1 tablet by mouth daily at 2 PM.   PROBIOTIC PO Take 1 tablet by mouth daily with breakfast. Nature's Bounty 20 million culture.   vitamin C 1000 MG tablet Take 1,000 mg by mouth daily at 2 PM.   VITAMIN D3 PO Take 1 tablet by mouth daily at 2 PM.   vitamin E 400 UNIT capsule Take 400 Units by mouth daily at 2 PM.            Discharge Care Instructions        Start     Ordered   03/16/17 0000  aspirin EC 325 MG tablet  Daily     03/16/17 0743     Follow-up Information    Georganna Skeans, MD Follow up.   Specialty:  General Surgery Why:  F/U 10/3. My office will call you and tell you the appointment time. Contact information: [place] [address] [phone]           Signed: Zenovia Jarred 03/16/2017, 7:44 AM

## 2017-08-18 ENCOUNTER — Other Ambulatory Visit: Payer: Self-pay | Admitting: Orthopaedic Surgery

## 2017-08-18 DIAGNOSIS — M179 Osteoarthritis of knee, unspecified: Secondary | ICD-10-CM

## 2017-08-25 ENCOUNTER — Ambulatory Visit
Admission: RE | Admit: 2017-08-25 | Discharge: 2017-08-25 | Disposition: A | Discharge status: Home / Self Care | Payer: Medicare Other | Source: Ambulatory Visit | Attending: Orthopaedic Surgery | Admitting: Orthopaedic Surgery

## 2017-08-25 DIAGNOSIS — M179 Osteoarthritis of knee, unspecified: Secondary | ICD-10-CM

## 2018-02-10 ENCOUNTER — Emergency Department (HOSPITAL_COMMUNITY): Payer: Medicare Other

## 2018-02-10 ENCOUNTER — Encounter (HOSPITAL_COMMUNITY): Payer: Self-pay | Admitting: Emergency Medicine

## 2018-02-10 ENCOUNTER — Inpatient Hospital Stay (HOSPITAL_COMMUNITY)
Admission: EM | Admit: 2018-02-10 | Discharge: 2018-02-18 | DRG: 390 | Disposition: A | Discharge status: Home / Self Care | Payer: Medicare Other | Attending: Internal Medicine | Admitting: Internal Medicine

## 2018-02-10 ENCOUNTER — Other Ambulatory Visit: Payer: Self-pay

## 2018-02-10 DIAGNOSIS — M4690 Unspecified inflammatory spondylopathy, site unspecified: Secondary | ICD-10-CM | POA: Diagnosis present

## 2018-02-10 DIAGNOSIS — Z85828 Personal history of other malignant neoplasm of skin: Secondary | ICD-10-CM

## 2018-02-10 DIAGNOSIS — Z9852 Vasectomy status: Secondary | ICD-10-CM

## 2018-02-10 DIAGNOSIS — Z9049 Acquired absence of other specified parts of digestive tract: Secondary | ICD-10-CM

## 2018-02-10 DIAGNOSIS — Z8249 Family history of ischemic heart disease and other diseases of the circulatory system: Secondary | ICD-10-CM

## 2018-02-10 DIAGNOSIS — K56600 Partial intestinal obstruction, unspecified as to cause: Principal | ICD-10-CM | POA: Diagnosis present

## 2018-02-10 DIAGNOSIS — Z0189 Encounter for other specified special examinations: Secondary | ICD-10-CM

## 2018-02-10 DIAGNOSIS — Z808 Family history of malignant neoplasm of other organs or systems: Secondary | ICD-10-CM | POA: Diagnosis not present

## 2018-02-10 DIAGNOSIS — Z79891 Long term (current) use of opiate analgesic: Secondary | ICD-10-CM | POA: Diagnosis not present

## 2018-02-10 DIAGNOSIS — Z87891 Personal history of nicotine dependence: Secondary | ICD-10-CM

## 2018-02-10 DIAGNOSIS — G894 Chronic pain syndrome: Secondary | ICD-10-CM | POA: Diagnosis present

## 2018-02-10 DIAGNOSIS — Z9889 Other specified postprocedural states: Secondary | ICD-10-CM

## 2018-02-10 DIAGNOSIS — Z7982 Long term (current) use of aspirin: Secondary | ICD-10-CM | POA: Diagnosis not present

## 2018-02-10 DIAGNOSIS — K562 Volvulus: Secondary | ICD-10-CM | POA: Diagnosis present

## 2018-02-10 DIAGNOSIS — R109 Unspecified abdominal pain: Secondary | ICD-10-CM | POA: Diagnosis not present

## 2018-02-10 DIAGNOSIS — K219 Gastro-esophageal reflux disease without esophagitis: Secondary | ICD-10-CM | POA: Diagnosis present

## 2018-02-10 DIAGNOSIS — K56609 Unspecified intestinal obstruction, unspecified as to partial versus complete obstruction: Secondary | ICD-10-CM | POA: Diagnosis not present

## 2018-02-10 LAB — URINALYSIS, ROUTINE W REFLEX MICROSCOPIC
BILIRUBIN URINE: NEGATIVE
Glucose, UA: NEGATIVE mg/dL
Hgb urine dipstick: NEGATIVE
KETONES UR: NEGATIVE mg/dL
Leukocytes, UA: NEGATIVE
Nitrite: NEGATIVE
PH: 5 (ref 5.0–8.0)
Protein, ur: NEGATIVE mg/dL
SPECIFIC GRAVITY, URINE: 1.008 (ref 1.005–1.030)

## 2018-02-10 LAB — CBC WITH DIFFERENTIAL/PLATELET
ABS IMMATURE GRANULOCYTES: 0 10*3/uL (ref 0.0–0.1)
Basophils Absolute: 0.1 10*3/uL (ref 0.0–0.1)
Basophils Relative: 1 %
EOS PCT: 2 %
Eosinophils Absolute: 0.1 10*3/uL (ref 0.0–0.7)
HEMATOCRIT: 39.7 % (ref 39.0–52.0)
HEMOGLOBIN: 13 g/dL (ref 13.0–17.0)
Immature Granulocytes: 0 %
LYMPHS ABS: 1.2 10*3/uL (ref 0.7–4.0)
Lymphocytes Relative: 19 %
MCH: 29.3 pg (ref 26.0–34.0)
MCHC: 32.7 g/dL (ref 30.0–36.0)
MCV: 89.6 fL (ref 78.0–100.0)
Monocytes Absolute: 0.5 10*3/uL (ref 0.1–1.0)
Monocytes Relative: 8 %
NEUTROS ABS: 4.6 10*3/uL (ref 1.7–7.7)
Neutrophils Relative %: 70 %
Platelets: 224 10*3/uL (ref 150–400)
RBC: 4.43 MIL/uL (ref 4.22–5.81)
RDW: 12.9 % (ref 11.5–15.5)
WBC: 6.5 10*3/uL (ref 4.0–10.5)

## 2018-02-10 LAB — COMPREHENSIVE METABOLIC PANEL
ALBUMIN: 4.1 g/dL (ref 3.5–5.0)
ALK PHOS: 71 U/L (ref 38–126)
ALT: 37 U/L (ref 0–44)
AST: 42 U/L — AB (ref 15–41)
Anion gap: 8 (ref 5–15)
BILIRUBIN TOTAL: 0.9 mg/dL (ref 0.3–1.2)
BUN: 11 mg/dL (ref 8–23)
CALCIUM: 9.8 mg/dL (ref 8.9–10.3)
CO2: 30 mmol/L (ref 22–32)
CREATININE: 1.16 mg/dL (ref 0.61–1.24)
Chloride: 100 mmol/L (ref 98–111)
GFR calc Af Amer: >60 mL/min (ref >60)
GFR calc non Af Amer: 60 mL/min — ABNORMAL LOW (ref >60)
GLUCOSE: 140 mg/dL — AB (ref 70–99)
Potassium: 4.1 mmol/L (ref 3.5–5.1)
SODIUM: 138 mmol/L (ref 135–145)
TOTAL PROTEIN: 7.5 g/dL (ref 6.5–8.1)

## 2018-02-10 LAB — LIPASE, BLOOD: Lipase: 30 U/L (ref 11–51)

## 2018-02-10 MED ORDER — METHOCARBAMOL 500 MG PO TABS: 1000.0000 mg | ORAL_TABLET | Freq: Two times a day (BID) | ORAL | Status: DC

## 2018-02-10 MED ORDER — ONDANSETRON HCL 4 MG/2ML IJ SOLN
4.0000 mg | Freq: Four times a day (QID) | INTRAMUSCULAR | Status: DC | PRN
  Administered 2018-02-11 (×2): 4 mg via INTRAVENOUS
  Filled 2018-02-10 (×2): qty 2

## 2018-02-10 MED ORDER — SODIUM CHLORIDE 0.9 % IV BOLUS
1000.0000 mL | Freq: Once | INTRAVENOUS | Status: AC
  Administered 2018-02-10: 1000 mL via INTRAVENOUS

## 2018-02-10 MED ORDER — MORPHINE SULFATE (PF) 2 MG/ML IV SOLN: 2.0000 mg | INTRAVENOUS | Status: DC | PRN

## 2018-02-10 MED ORDER — ACETAMINOPHEN 325 MG PO TABS
650.0000 mg | ORAL_TABLET | Freq: Four times a day (QID) | ORAL | Status: DC | PRN
  Filled 2018-02-10: qty 2

## 2018-02-10 MED ORDER — ONDANSETRON HCL 4 MG PO TABS
4.0000 mg | ORAL_TABLET | Freq: Four times a day (QID) | ORAL | Status: DC | PRN
  Administered 2018-02-11 – 2018-02-15 (×3): 4 mg via ORAL
  Filled 2018-02-10 (×3): qty 1

## 2018-02-10 MED ORDER — IOPAMIDOL (ISOVUE-300) INJECTION 61%
100.0000 mL | Freq: Once | INTRAVENOUS | Status: AC | PRN
  Administered 2018-02-10: 100 mL via INTRAVENOUS

## 2018-02-10 MED ORDER — ONDANSETRON 4 MG PO TBDP
8.0000 mg | ORAL_TABLET | Freq: Once | ORAL | Status: AC
  Administered 2018-02-10: 8 mg via ORAL
  Filled 2018-02-10: qty 2

## 2018-02-10 MED ORDER — DIATRIZOATE MEGLUMINE & SODIUM 66-10 % PO SOLN
90.0000 mL | Freq: Once | ORAL | Status: DC
  Filled 2018-02-10 (×2): qty 90

## 2018-02-10 MED ORDER — MORPHINE SULFATE (PF) 4 MG/ML IV SOLN: 4.0000 mg | INTRAVENOUS | Status: DC | PRN

## 2018-02-10 MED ORDER — ACETAMINOPHEN 650 MG RE SUPP: 650.0000 mg | Freq: Four times a day (QID) | RECTAL | Status: DC | PRN

## 2018-02-10 MED ORDER — SODIUM CHLORIDE 0.9 % IV SOLN
INTRAVENOUS | Status: DC
  Administered 2018-02-11 – 2018-02-14 (×5): via INTRAVENOUS
  Administered 2018-02-15: 1000 mL via INTRAVENOUS
  Administered 2018-02-16: 02:00:00 via INTRAVENOUS

## 2018-02-10 MED ORDER — ONDANSETRON HCL 4 MG/2ML IJ SOLN
4.0000 mg | Freq: Once | INTRAMUSCULAR | Status: AC
  Administered 2018-02-10: 4 mg via INTRAVENOUS
  Filled 2018-02-10: qty 2

## 2018-02-10 MED ORDER — OXYCODONE HCL 20 MG PO TB12: 20.0000 mg | ORAL_TABLET | Freq: Two times a day (BID) | ORAL | Status: DC

## 2018-02-10 MED ORDER — MORPHINE SULFATE (PF) 4 MG/ML IV SOLN
4.0000 mg | INTRAVENOUS | Status: DC | PRN
  Administered 2018-02-11 (×3): 4 mg via INTRAVENOUS
  Filled 2018-02-10 (×3): qty 1

## 2018-02-10 MED ORDER — IOPAMIDOL (ISOVUE-300) INJECTION 61%
INTRAVENOUS | Status: AC
  Filled 2018-02-10: qty 100

## 2018-02-10 MED ORDER — MORPHINE SULFATE (PF) 4 MG/ML IV SOLN
4.0000 mg | Freq: Once | INTRAVENOUS | Status: AC
  Administered 2018-02-10: 4 mg via INTRAVENOUS
  Filled 2018-02-10: qty 1

## 2018-02-10 NOTE — ED Provider Notes (Signed)
Patient placed in Quick Look pathway, seen and evaluated   Chief Complaint: abdominal pain.    HPI:  Pt states pain started yesterday. reports chronic constipation, takes miralax daily. States had normal bowel movement yesterday. States abdomen feels swollen. States very tender. Reports hx of SBO, feels similar. States has nausea, no vomiting. Last SBO about a year ago due to adhesions requiring surgery    ROS: abdominal pain, nausea  Physical Exam:   Gen: No distress  Neuro: Awake and Alert  Skin: Warm    Focused Exam: diffuse abd distention, diffuse abd tenderness. Decreased bowel sounds   Initiation of care has begun. The patient has been counseled on the process, plan, and necessity for staying for the completion/evaluation, and the remainder of the medical screening examination   Exam and hx concerning for SBO. Labs, ct abd pelvis ordered. Vs normal. Will give zofran for nausea  Vitals:   02/10/18 1710  BP: 103/78  Pulse: 86  Resp: 16  Temp: 97.6 F (36.4 C)  SpO2: 100%      Jeannett Senior, PA-C 02/10/18 [place], [place], DO 02/10/18 1955

## 2018-02-10 NOTE — ED Provider Notes (Signed)
[facility] EMERGENCY DEPARTMENT Provider Note   CSN: [id number] Arrival date & time: 02/10/18  1655     History   Chief Complaint Chief Complaint  Patient presents with  . Abdominal Pain    HPI Kent [name] is a 77 y.o. male.  77 yo M with a cc of abdominal pain.  Going on since last night and progressively worsening.  Hx of multiple SBO, feels similar.  No flatus, nausea without vomiting.  Diffuse abdominal pain, worse to the lower abdomen.    Had surgery about a year ago for the same with Dr. Grandville Silos.   The history is provided by the patient.  Abdominal Pain   This is a recurrent problem. The current episode started yesterday. The problem occurs constantly. The problem has been gradually worsening. The pain is located in the generalized abdominal region. The quality of the pain is sharp and shooting. The pain is at a severity of 7/10. The pain is moderate. Associated symptoms include nausea and constipation. Pertinent negatives include fever, diarrhea, vomiting, headaches, arthralgias and myalgias. Nothing aggravates the symptoms. Nothing relieves the symptoms.    Past Medical History:  Diagnosis Date  . 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 Cancer ([place])    basal cell cancer - skin  . Chronic back pain    s/p back surgery  . GERD (gastroesophageal reflux disease)   . GERD (gastroesophageal reflux disease) 08/18/2013  . Incisional hernia   . Incisional hernia, without obstruction or gangrene 02/02/2013  . Postural dizziness with presyncope 09/01/2016  . Sinus infection    currently on antibiotics  . Small bowel obstruction due to adhesions ([place])   . Small bowel obstruction due to adhesions [facility])     Patient Active Problem List   Diagnosis Date Noted  . S/P exploratory laparotomy 03/10/2017  . Elevated blood pressure reading 12/30/2016  . Nonspecific abnormal electrocardiogram (ECG) (EKG) 09/01/2016  . History of colectomy 02/13/2016  . Intractable  abdominal pain 02/13/2016  . S/P laparoscopic cholecystectomy 10/16/2014  . S/P hernia repair 04/13/2014  . Pre-op evaluation 03/24/2014  . H/O abdominal surgery 08/18/2013  . GERD (gastroesophageal reflux disease) 08/18/2013  . Chronic back pain 08/18/2013  . SBO (small bowel obstruction) ([place]) 08/12/2013    Past Surgical History:  Procedure Laterality Date  . BACK SURGERY  08/25/2001   Neck 3 disk replacements  . BACK SURGERY     Placement/removal of nerve stimulator  . CHOLECYSTECTOMY N/A 10/16/2014   Procedure: LAPAROSCOPIC CHOLECYSTECTOMY WITH ATTEMPTED CHOLANGIOGRAM;  Surgeon: Georganna Skeans, MD;  Location: [facility];  Service: General;  Laterality: N/A;  . COLON SURGERY  04/2002   Dr. Deon Pilling - Subtotal colectomy, ileostomy  . ELBOW SURGERY Right    for blood vessel & " hung" nerve  . ESOPHAGEAL DILATION    . EXPLORATORY LAPAROTOMY  03/10/2017  . HERNIA REPAIR    . Ileostomy Takedown  08/2002   Dr. Deon Pilling  . INCISION AND DRAINAGE FOOT    . INCISIONAL HERNIA REPAIR N/A 01/20/2013   Procedure: HERNIA REPAIR INCISIONAL;  Surgeon: Zenovia Jarred, MD;  Location: [facility];  Service: General;  Laterality: N/A;  . [place]  04/13/2014   with mesh    dr Grandville Silos  . INCISIONAL HERNIA REPAIR N/A 04/13/2014   Procedure: OPEN REPAIR INCISIONAL HERNIA ;  Surgeon: Georganna Skeans, MD;  Location: [facility];  Service: General;  Laterality: N/A;  . INSERTION OF  MESH N/A 04/13/2014   Procedure: INSERTION OF MESH;  Surgeon: Georganna Skeans, MD;  Location: [facility];  Service: General;  Laterality: N/A;  . KNEE ARTHROSCOPY Right 2010  . LAPAROSCOPIC CHOLECYSTECTOMY  10/16/2014  . LAPAROTOMY N/A 01/20/2013   Procedure: EXPLORATORY LAPAROTOMY;  Surgeon: Zenovia Jarred, MD;  Location: [facility];  Service: General;  Laterality: N/A;  . LAPAROTOMY N/A 03/10/2017   Procedure: EXPLORATORY LAPAROTOMY;  Surgeon: Georganna Skeans, MD;  Location: [facility];  Service: General;  Laterality: N/A;  . Laparotomy with  lysis of adhesions  2010   Dr. Grandville Silos - SBO  . LYSIS OF ADHESION N/A 01/20/2013   Procedure: LYSIS OF ADHESION;  Surgeon: Zenovia Jarred, MD;  Location: [facility];  Service: General;  Laterality: N/A;  . LYSIS OF ADHESION N/A 03/10/2017   Procedure: LYSIS OF ADHESION;  Surgeon: Georganna Skeans, MD;  Location: [facility];  Service: General;  Laterality: N/A;  . microsurgical  03/24/2002   Lateral recess decompression L5  . SACRAL NERVE STIMULATOR PLACEMENT     inserted and removed  . SHOULDER ARTHROSCOPY Bilateral   . VASECTOMY          Home Medications    Prior to Admission medications   Medication Sig Start Date End Date Taking? Authorizing Provider  Ascorbic Acid (VITAMIN C) 1000 MG tablet Take 1,000 mg by mouth daily at 2 PM.    [provider]  aspirin EC 325 MG tablet Take 1 tablet (325 mg total) by mouth daily. 03/16/17 03/18/18  Georganna Skeans, MD  Cholecalciferol (VITAMIN D3 PO) Take 1 tablet by mouth daily at 2 PM.    [provider]  guaiFENesin (MUCINEX) 600 MG 12 hr tablet Take 600-1,200 mg by mouth 2 (two) times daily as needed (for allergies/congestion.).    [provider]  HYDROcodone-acetaminophen (NORCO) 10-325 MG tablet Take 1 tablet by mouth every 8 (eight) hours.     [provider]  lidocaine (LIDODERM) 5 % Place 1 patch onto the skin daily as needed (for back pain). Remove & Discard patch within 12 hours or as directed by MD     [provider]  Loratadine-Pseudoephedrine (CLARITIN-D 24 HOUR PO) Take 1 tablet by mouth daily as needed (for allergies/congestion.).     [provider]  methocarbamol (ROBAXIN) 500 MG tablet Take 1,000 mg by mouth every 12 (twelve) hours.     [provider]  Multiple Vitamins-Minerals (PRESERVISION AREDS 2 PO) Take 1 tablet by mouth daily at 2 PM.    [provider]  omeprazole (PRILOSEC) 20 MG capsule Take 20 mg by mouth at bedtime.  02/03/16   [provider]    oxyCODONE (OXYCONTIN) 20 MG 12 hr tablet Take 20 mg by mouth every 12 (twelve) hours.    [provider]  Probiotic Product (PROBIOTIC PO) Take 1 tablet by mouth daily with breakfast. Nature's Bounty 20 million culture.    [provider]  vitamin E 400 UNIT capsule Take 400 Units by mouth daily at 2 PM.     [provider]    Family History Family History  Problem Relation Age of Onset  . Heart disease Mother   . Brain cancer Father     Social History Social History   Tobacco Use  . Smoking status: Former Smoker    Last attempt to quit: 10/09/1979    Years since quitting: 38.3  . Smokeless tobacco: Former Systems developer    Types: Loss adjuster, chartered  Quit date: 03/05/1986  Substance Use Topics  . Alcohol use: No  . Drug use: No     Allergies   Patient has no known allergies.   Review of Systems Review of Systems  Constitutional: Negative for chills and fever.  HENT: Negative for congestion and facial swelling.   Eyes: Negative for discharge and visual disturbance.  Respiratory: Negative for shortness of breath.   Cardiovascular: Negative for chest pain and palpitations.  Gastrointestinal: Positive for abdominal pain, constipation and nausea. Negative for diarrhea and vomiting.  Musculoskeletal: Negative for arthralgias and myalgias.  Skin: Negative for color change and rash.  Neurological: Negative for tremors, syncope and headaches.  Psychiatric/Behavioral: Negative for confusion and dysphoric mood.     Physical Exam Updated Vital Signs BP (!) 172/75   Pulse 80   Temp 97.6 F (36.4 C)   Resp 11   Ht 5\' 11"  (1.803 m)   Wt 70.8 kg   SpO2 99%   BMI 21.76 kg/m   Physical Exam  Constitutional: He is oriented to person, place, and time. He appears well-developed and well-nourished.  HENT:  Head: Normocephalic and atraumatic.  Eyes: Pupils are equal, round, and reactive to light. EOM are normal.  Neck: Normal range of motion. Neck supple. No JVD  present.  Cardiovascular: Normal rate and regular rhythm. Exam reveals no gallop and no friction rub.  No murmur heard. Pulmonary/Chest: No respiratory distress. He has no wheezes.  Abdominal: He exhibits distension. There is tenderness. There is no rebound and no guarding.  Musculoskeletal: Normal range of motion.  Neurological: He is alert and oriented to person, place, and time.  Skin: No rash noted. No pallor.  Psychiatric: He has a normal mood and affect. His behavior is normal.  Nursing note and vitals reviewed.    ED Treatments / Results  Labs (all labs ordered are listed, but only abnormal results are displayed) Labs Reviewed  COMPREHENSIVE METABOLIC PANEL - Abnormal; Notable for the following components:      Result Value   Glucose, Bld 140 (*)    AST 42 (*)    GFR calc non Af Amer 60 (*)    All other components within normal limits  CBC WITH DIFFERENTIAL/PLATELET  LIPASE, BLOOD  URINALYSIS, ROUTINE W REFLEX MICROSCOPIC    EKG None  Radiology Ct Abdomen Pelvis W Contrast  Result Date: 02/10/2018 CLINICAL DATA:  Bowel obstruction multiple surgery EXAM: CT ABDOMEN AND PELVIS WITH CONTRAST TECHNIQUE: Multidetector CT imaging of the abdomen and pelvis was performed using the standard protocol following bolus administration of intravenous contrast. CONTRAST:  126mL ISOVUE-300 IOPAMIDOL (ISOVUE-300) INJECTION 61% COMPARISON:  12/30/2016, CT 12/29/2016, 09/01/2016, 02/13/2016 FINDINGS: Lower chest: Lung bases demonstrate no acute consolidation or effusion. The heart size is normal. Hepatobiliary: Status post cholecystectomy. Stable mild intra and extrahepatic biliary dilatation. Pancreas: Unremarkable. No pancreatic ductal dilatation or surrounding inflammatory changes. Atrophic Spleen: Normal in size without focal abnormality. Adrenals/Urinary Tract: Adrenal glands are unremarkable. Kidneys are normal, without renal calculi, focal lesion, or hydronephrosis. Bladder is  unremarkable. Stomach/Bowel: Stomach is nonenlarged. Dilated fluid-filled distal small bowel measuring up to 4 cm. Status post subtotal colectomy. Transition point visualized within the upper pelvis near the midline where there is swirled appearance of mesentery and vasculature, this is best seen on sagittal images series 7 image 49 through 51. No bowel wall thickening. No pneumatosis. Vascular/Lymphatic: Moderate aortic atherosclerosis. No aneurysm. No significantly enlarged lymph nodes. Reproductive: Slightly enlarged prostate with calcification Other: Negative for free air  or free fluid.  Ventral scarring Musculoskeletal: No acute or significant osseous findings. IMPRESSION: 1. Status post subtotal colectomy with findings consistent with distal small-bowel obstruction. Transition point appears to be within the upper pelvis where there is swirled appearance of mesentery and vascular structures, possibly representing mesenteric twist/volvulus or internal hernia. No pneumatosis or free air. 2. Status post cholecystectomy with stable mild intra and extrahepatic biliary dilatation. Electronically Signed   By: Donavan Foil M.D.   On: 02/10/2018 19:48    Procedures Procedures (including critical care time)  Medications Ordered in ED Medications  sodium chloride 0.9 % bolus 1,000 mL (1,000 mLs Intravenous New Bag/Given 02/10/18 2202)  diatrizoate meglumine-sodium (GASTROGRAFIN) 66-10 % solution 90 mL (has no administration in time range)  ondansetron (ZOFRAN-ODT) disintegrating tablet 8 mg (8 mg Oral Given 02/10/18 1717)  iopamidol (ISOVUE-300) 61 % injection 100 mL (100 mLs Intravenous Contrast Given 02/10/18 1911)  morphine 4 MG/ML injection 4 mg (4 mg Intravenous Given 02/10/18 2202)  ondansetron (ZOFRAN) injection 4 mg (4 mg Intravenous Given 02/10/18 2202)     Initial Impression / Assessment and Plan / ED Course  I have reviewed the triage vital signs and the nursing notes.  Pertinent labs & imaging  results that were available during my care of the patient were reviewed by me and considered in my medical decision making (see chart for details).     77 yo M with a cc of abdominal pain, nausea and constipation.  Going on for the past day.  Worsening, hx of sbo, feels the same.  CT here with SBO.  Discussed with Dr. Brantley Stage, gen surgery, will come and evaluate the patient.  Holding off on NG at this time without vomiting.  Will discuss with hospitalist for admission.   The patients results and plan were reviewed and discussed.   Any x-rays performed were independently reviewed by myself.   Differential diagnosis were considered with the presenting HPI.  Medications  sodium chloride 0.9 % bolus 1,000 mL (1,000 mLs Intravenous New Bag/Given 02/10/18 2202)  diatrizoate meglumine-sodium (GASTROGRAFIN) 66-10 % solution 90 mL (has no administration in time range)  ondansetron (ZOFRAN-ODT) disintegrating tablet 8 mg (8 mg Oral Given 02/10/18 1717)  iopamidol (ISOVUE-300) 61 % injection 100 mL (100 mLs Intravenous Contrast Given 02/10/18 1911)  morphine 4 MG/ML injection 4 mg (4 mg Intravenous Given 02/10/18 2202)  ondansetron (ZOFRAN) injection 4 mg (4 mg Intravenous Given 02/10/18 2202)    Vitals:   02/10/18 2145 02/10/18 2200 02/10/18 2215 02/10/18 2230  BP: (!) 156/95 (!) 153/76 (!) 154/98 (!) 172/75  Pulse: 77 84 83 80  Resp: 13 13 17 11   Temp:      SpO2: 96% 100% 100% 99%  Weight:      Height:        Final diagnoses:  Encounter for imaging study to confirm nasogastric (NG) tube placement  Small bowel obstruction ([place])    Admission/ observation were discussed with the admitting physician, patient and/or family and they are comfortable with the plan.    Final Clinical Impressions(s) / ED Diagnoses   Final diagnoses:  Encounter for imaging study to confirm nasogastric (NG) tube placement  Small bowel obstruction [facility])    ED Discharge Orders    None       Deno Etienne,  DO 02/10/18 2245

## 2018-02-10 NOTE — ED Triage Notes (Signed)
Patient to ED c/o generalized abdominal pain since yesterday. Hx Small Bowel Obstruction last September, requiring surgery. He reports he takes PO morphine daily and typically relies on Miralax for assistance with bowel movements. He reports last BM yesterday and normal. Bowel sounds hypoactive. Endorses abdominal distention and nausea without vomiting.

## 2018-02-10 NOTE — Consult Note (Signed)
Reason for Consult: Small bowel obstruction Referring Physician: Tyrone Nine DO  Kent [name] is an 77 y.o. male.  HPI: Asked to see patient at request of Dr. Tyrone Nine for small bowel obstruction.  Patient is a 1 day history of increasing abdominal distention, nausea vomiting abdominal pain which is diffuse.  His last bowel movement was yesterday.  He has a history of a subtotal colectomy.  He has had multiple abdominal procedures by Dr. Grandville Silos including incisional hernia repairs with mesh and lysis of adhesions.  He presents with a 1 day history of progressive abdominal distention, nausea, vomiting abdominal pain.  CT scan shows findings consistent with small bowel obstruction.  He has chronic pain and takes chronic narcotics.  Currently he is relatively comfortable.  He said no further nausea or vomiting in the emergency room.  He is not passing flatus.  Past Medical History:  Diagnosis Date  . 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 Cancer ([place])    basal cell cancer - skin  . Chronic back pain    s/p back surgery  . GERD (gastroesophageal reflux disease)   . GERD (gastroesophageal reflux disease) 08/18/2013  . Incisional hernia   . Incisional hernia, without obstruction or gangrene 02/02/2013  . Postural dizziness with presyncope 09/01/2016  . Sinus infection    currently on antibiotics  . Small bowel obstruction due to adhesions ([place])   . Small bowel obstruction due to adhesions [facility])     Past Surgical History:  Procedure Laterality Date  . BACK SURGERY  08/25/2001   Neck 3 disk replacements  . BACK SURGERY     Placement/removal of nerve stimulator  . CHOLECYSTECTOMY N/A 10/16/2014   Procedure: LAPAROSCOPIC CHOLECYSTECTOMY WITH ATTEMPTED CHOLANGIOGRAM;  Surgeon: Georganna Skeans, MD;  Location: [place];  Service: General;  Laterality: N/A;  . COLON SURGERY  04/2002   Dr. Deon Pilling - Subtotal colectomy, ileostomy  . ELBOW SURGERY Right    for blood vessel & " hung" nerve  . ESOPHAGEAL DILATION    .  EXPLORATORY LAPAROTOMY  03/10/2017  . HERNIA REPAIR    . Ileostomy Takedown  08/2002   Dr. Deon Pilling  . INCISION AND DRAINAGE FOOT    . INCISIONAL HERNIA REPAIR N/A 01/20/2013   Procedure: HERNIA REPAIR INCISIONAL;  Surgeon: Zenovia Jarred, MD;  Location: [place];  Service: General;  Laterality: N/A;  . [place]  04/13/2014   with mesh    dr Grandville Silos  . INCISIONAL HERNIA REPAIR N/A 04/13/2014   Procedure: OPEN REPAIR INCISIONAL HERNIA ;  Surgeon: Georganna Skeans, MD;  Location: [place];  Service: General;  Laterality: N/A;  . INSERTION OF MESH N/A 04/13/2014   Procedure: INSERTION OF MESH;  Surgeon: Georganna Skeans, MD;  Location: [place];  Service: General;  Laterality: N/A;  . KNEE ARTHROSCOPY Right 2010  . LAPAROSCOPIC CHOLECYSTECTOMY  10/16/2014  . LAPAROTOMY N/A 01/20/2013   Procedure: EXPLORATORY LAPAROTOMY;  Surgeon: Zenovia Jarred, MD;  Location: [place];  Service: General;  Laterality: N/A;  . LAPAROTOMY N/A 03/10/2017   Procedure: EXPLORATORY LAPAROTOMY;  Surgeon: Georganna Skeans, MD;  Location: [place];  Service: General;  Laterality: N/A;  . Laparotomy with lysis of adhesions  2010   Dr. Grandville Silos - SBO  . LYSIS OF ADHESION N/A 01/20/2013   Procedure: LYSIS OF ADHESION;  Surgeon: Zenovia Jarred, MD;  Location: [place];  Service: General;  Laterality: N/A;  . LYSIS OF ADHESION N/A 03/10/2017  Procedure: LYSIS OF ADHESION;  Surgeon: Georganna Skeans, MD;  Location: [place];  Service: General;  Laterality: N/A;  . microsurgical  03/24/2002   Lateral recess decompression L5  . SACRAL NERVE STIMULATOR PLACEMENT     inserted and removed  . SHOULDER ARTHROSCOPY Bilateral   . VASECTOMY      Family History  Problem Relation Age of Onset  . Heart disease Mother   . Brain cancer Father     Social History:  reports that he quit smoking about 38 years ago. He quit smokeless tobacco use about 31 years ago.  His smokeless tobacco use included chew. He reports that he does not drink  alcohol or use drugs.  Allergies: No Known Allergies  Medications: I have reviewed the patient's current medications.  Results for orders placed or performed during the hospital encounter of 02/10/18 (from the past 48 hour(s))  CBC with Differential     Status: None   Collection Time: 02/10/18  5:10 PM  Result Value Ref Range   WBC 6.5 4.0 - 10.5 K/uL   RBC 4.43 4.22 - 5.81 MIL/uL   Hemoglobin 13.0 13.0 - 17.0 g/dL   HCT 39.7 39.0 - 52.0 %   MCV 89.6 78.0 - 100.0 fL   MCH 29.3 26.0 - 34.0 pg   MCHC 32.7 30.0 - 36.0 g/dL   RDW 12.9 11.5 - 15.5 %   Platelets 224 150 - 400 K/uL   Neutrophils Relative % 70 %   Neutro Abs 4.6 1.7 - 7.7 K/uL   Lymphocytes Relative 19 %   Lymphs Abs 1.2 0.7 - 4.0 K/uL   Monocytes Relative 8 %   Monocytes Absolute 0.5 0.1 - 1.0 K/uL   Eosinophils Relative 2 %   Eosinophils Absolute 0.1 0.0 - 0.7 K/uL   Basophils Relative 1 %   Basophils Absolute 0.1 0.0 - 0.1 K/uL   Immature Granulocytes 0 %   Abs Immature Granulocytes 0.0 0.0 - 0.1 K/uL    Comment: Performed at [facility], [address] [place], [place] [address]  Comprehensive metabolic panel     Status: Abnormal   Collection Time: 02/10/18  5:10 PM  Result Value Ref Range   Sodium 138 135 - 145 mmol/L   Potassium 4.1 3.5 - 5.1 mmol/L   Chloride 100 98 - 111 mmol/L   CO2 30 22 - 32 mmol/L   Glucose, Bld 140 (H) 70 - 99 mg/dL   BUN 11 8 - 23 mg/dL   Creatinine, Ser 1.16 0.61 - 1.24 mg/dL   Calcium 9.8 8.9 - 10.3 mg/dL   Total Protein 7.5 6.5 - 8.1 g/dL   Albumin 4.1 3.5 - 5.0 g/dL   AST 42 (H) 15 - 41 U/L   ALT 37 0 - 44 U/L   Alkaline Phosphatase 71 38 - 126 U/L   Total Bilirubin 0.9 0.3 - 1.2 mg/dL   GFR calc non Af Amer 60 (L) >60 mL/min   GFR calc Af Amer >60 >60 mL/min    Comment: (NOTE) The eGFR has been calculated using the CKD EPI equation. This calculation has not been validated in all clinical situations. eGFR's persistently <60 mL/min signify possible Chronic  Kidney Disease.    Anion gap 8 5 - 15    Comment: Performed at [facility] [address], [place], [place] [address]  Lipase, blood     Status: None   Collection Time: 02/10/18  5:10 PM  Result Value Ref  Range   Lipase 30 11 - 51 U/L    Comment: Performed at [facility] [address], [place], [place] [address]  Urinalysis, Routine w reflex microscopic     Status: None   Collection Time: 02/10/18  8:16 PM  Result Value Ref Range   Color, Urine YELLOW YELLOW   APPearance CLEAR CLEAR   Specific Gravity, Urine 1.008 1.005 - 1.030   pH 5.0 5.0 - 8.0   Glucose, UA NEGATIVE NEGATIVE mg/dL   Hgb urine dipstick NEGATIVE NEGATIVE   Bilirubin Urine NEGATIVE NEGATIVE   Ketones, ur NEGATIVE NEGATIVE mg/dL   Protein, ur NEGATIVE NEGATIVE mg/dL   Nitrite NEGATIVE NEGATIVE   Leukocytes, UA NEGATIVE NEGATIVE    Comment: Performed at [facility] [address], [place], [place] [address]    Ct Abdomen Pelvis W Contrast  Result Date: 02/10/2018 CLINICAL DATA:  Bowel obstruction multiple surgery EXAM: CT ABDOMEN AND PELVIS WITH CONTRAST TECHNIQUE: Multidetector CT imaging of the abdomen and pelvis was performed using the standard protocol following bolus administration of intravenous contrast. CONTRAST:  164m ISOVUE-300 IOPAMIDOL (ISOVUE-300) INJECTION 61% COMPARISON:  12/30/2016, CT 12/29/2016, 09/01/2016, 02/13/2016 FINDINGS: Lower chest: Lung bases demonstrate no acute consolidation or effusion. The heart size is normal. Hepatobiliary: Status post cholecystectomy. Stable mild intra and extrahepatic biliary dilatation. Pancreas: Unremarkable. No pancreatic ductal dilatation or surrounding inflammatory changes. Atrophic Spleen: Normal in size without focal abnormality. Adrenals/Urinary Tract: Adrenal glands are unremarkable. Kidneys are normal, without renal calculi, focal lesion, or hydronephrosis. Bladder is unremarkable. Stomach/Bowel: Stomach is nonenlarged. Dilated  fluid-filled distal small bowel measuring up to 4 cm. Status post subtotal colectomy. Transition point visualized within the upper pelvis near the midline where there is swirled appearance of mesentery and vasculature, this is best seen on sagittal images series 7 image 49 through 51. No bowel wall thickening. No pneumatosis. Vascular/Lymphatic: Moderate aortic atherosclerosis. No aneurysm. No significantly enlarged lymph nodes. Reproductive: Slightly enlarged prostate with calcification Other: Negative for free air or free fluid.  Ventral scarring Musculoskeletal: No acute or significant osseous findings. IMPRESSION: 1. Status post subtotal colectomy with findings consistent with distal small-bowel obstruction. Transition point appears to be within the upper pelvis where there is swirled appearance of mesentery and vascular structures, possibly representing mesenteric twist/volvulus or internal hernia. No pneumatosis or free air. 2. Status post cholecystectomy with stable mild intra and extrahepatic biliary dilatation. Electronically Signed   By: KDonavan FoilM.D.   On: 02/10/2018 19:48    Review of Systems  Constitutional: Negative.   HENT: Negative.   Eyes: Negative.   Respiratory: Negative.   Cardiovascular: Negative.   Gastrointestinal: Positive for abdominal pain, heartburn, nausea and vomiting.  Genitourinary: Negative.   Musculoskeletal: Negative.   Skin: Negative.   Neurological: Negative.   Psychiatric/Behavioral: Negative.    Blood pressure (!) 156/95, pulse 77, temperature 97.6 F (36.4 C), resp. rate 13, height _0  (1.803 m), weight 70.8 kg, SpO2 96 %. Physical Exam  Constitutional: He appears well-developed and well-nourished.  HENT:  Head: Normocephalic and atraumatic.  Eyes: Pupils are equal, round, and reactive to light. EOM are normal.  Neck: Normal range of motion. Neck supple.  Cardiovascular: Normal rate and regular rhythm.  Respiratory: Effort normal and breath  sounds normal.  GI: Soft. Bowel sounds are normal. He exhibits distension. There is tenderness. There is no rebound and no guarding.  Musculoskeletal: Normal range of motion.  Neurological: He is alert.  Skin: Skin is warm  and dry.    Assessment/Plan: Recurrent small bowel obstruction  NG tube and small bowel protocol initiated.  Keep n.p.o. overnight.  Repeat films in a.m. to evaluate progress.  If no progress over the next 12 to 24 hours, he may require laparotomy.  CT scan reviewed.  No free fluid.  There is some questionable mesenteric swirling.  I see no evidence of bowel compromise on exam and his white count currently is normal.  We will follow these parameters closely as well as his examination.  Plan explained to patient and wife at bedside.  Medicine to admit.  Natallia Stellmach A Elif Yonts 02/10/2018, 10:22 PM

## 2018-02-10 NOTE — H&P (Signed)
History and Physical    Kent [name] RCV:[id number] DOB: 1940-09-05 DOA: 02/10/2018  PCP: Shirline Frees, MD  Patient coming from: Home  I have personally briefly reviewed patient's old medical records in [place]  Chief Complaint: Abd pain  HPI: Kent [name] is a 77 y.o. 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  Last admit for SBO was in July 2018.  Ultimately had Ex lap and 95 min lysis of adhesions in Sept 2018.  Patient presents to ED with c/o 1 day h/o worsening abd distention, N/V, abd pain which is diffuse.  Last BM yesterday.   ED Course: CT confirms SBO, suspicious for volvulus or internal hernia.  Dr. Brantley Stage consulted.  Hospitalist asked to admit.   Review of Systems: As per HPI otherwise 10 point review of systems negative.   Past Medical History:  Diagnosis Date  . 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 Cancer ([place])    basal cell cancer - skin  . Chronic back pain    s/p back surgery  . GERD (gastroesophageal reflux disease)   . GERD (gastroesophageal reflux disease) 08/18/2013  . Incisional hernia   . Incisional hernia, without obstruction or gangrene 02/02/2013  . Postural dizziness with presyncope 09/01/2016  . Sinus infection    currently on antibiotics  . Small bowel obstruction due to adhesions ([place])   . Small bowel obstruction due to adhesions [facility])     Past Surgical History:  Procedure Laterality Date  . BACK SURGERY  08/25/2001   Neck 3 disk replacements  . BACK SURGERY     Placement/removal of nerve stimulator  . CHOLECYSTECTOMY N/A 10/16/2014   Procedure: LAPAROSCOPIC CHOLECYSTECTOMY WITH ATTEMPTED CHOLANGIOGRAM;  Surgeon: Georganna Skeans, MD;  Location: [place];  Service: General;  Laterality: N/A;  . COLON SURGERY  04/2002   Dr. Deon Pilling - Subtotal colectomy, ileostomy  . ELBOW SURGERY Right    for blood vessel & " hung" nerve  .  ESOPHAGEAL DILATION    . EXPLORATORY LAPAROTOMY  03/10/2017  . HERNIA REPAIR    . Ileostomy Takedown  08/2002   Dr. Deon Pilling  . INCISION AND DRAINAGE FOOT    . INCISIONAL HERNIA REPAIR N/A 01/20/2013   Procedure: HERNIA REPAIR INCISIONAL;  Surgeon: Zenovia Jarred, MD;  Location: [place];  Service: General;  Laterality: N/A;  . [place]  04/13/2014   with mesh    dr Grandville Silos  . INCISIONAL HERNIA REPAIR N/A 04/13/2014   Procedure: OPEN REPAIR INCISIONAL HERNIA ;  Surgeon: Georganna Skeans, MD;  Location: [place];  Service: General;  Laterality: N/A;  . INSERTION OF MESH N/A 04/13/2014   Procedure: INSERTION OF MESH;  Surgeon: Georganna Skeans, MD;  Location: [place];  Service: General;  Laterality: N/A;  . KNEE ARTHROSCOPY Right 2010  . LAPAROSCOPIC CHOLECYSTECTOMY  10/16/2014  . LAPAROTOMY N/A 01/20/2013   Procedure: EXPLORATORY LAPAROTOMY;  Surgeon: Zenovia Jarred, MD;  Location: [place];  Service: General;  Laterality: N/A;  . LAPAROTOMY N/A 03/10/2017   Procedure: EXPLORATORY LAPAROTOMY;  Surgeon: Georganna Skeans, MD;  Location: [place];  Service: General;  Laterality: N/A;  . Laparotomy with lysis of adhesions  2010   Dr. Grandville Silos - SBO  . LYSIS OF ADHESION N/A 01/20/2013   Procedure: LYSIS OF ADHESION;  Surgeon: Zenovia Jarred, MD;  Location: [place];  Service: General;  Laterality: N/A;  . LYSIS OF ADHESION N/A 03/10/2017   Procedure: LYSIS OF ADHESION;  Surgeon: Georganna Skeans, MD;  Location: [place];  Service: General;  Laterality: N/A;  . microsurgical  03/24/2002   Lateral recess decompression L5  . SACRAL NERVE STIMULATOR PLACEMENT     inserted and removed  . SHOULDER ARTHROSCOPY Bilateral   . VASECTOMY       reports that he quit smoking about 38 years ago. He quit smokeless tobacco use about 31 years ago.  His smokeless tobacco use included chew. He reports that he does not drink alcohol or use drugs.  No Known Allergies  Family History  Problem Relation Age of Onset  .  Heart disease Mother   . Brain cancer Father      Prior to Admission medications   Medication Sig Start Date End Date Taking? Authorizing Provider  Ascorbic Acid (VITAMIN C) 1000 MG tablet Take 1,000 mg by mouth daily at 2 PM.    [provider]  aspirin EC 325 MG tablet Take 1 tablet (325 mg total) by mouth daily. 03/16/17 03/18/18  Georganna Skeans, MD  Cholecalciferol (VITAMIN D3 PO) Take 1 tablet by mouth daily at 2 PM.    [provider]  guaiFENesin (MUCINEX) 600 MG 12 hr tablet Take 600-1,200 mg by mouth 2 (two) times daily as needed (for allergies/congestion.).    [provider]  HYDROcodone-acetaminophen (NORCO) 10-325 MG tablet Take 1 tablet by mouth every 8 (eight) hours.     [provider]  lidocaine (LIDODERM) 5 % Place 1 patch onto the skin daily as needed (for back pain). Remove & Discard patch within 12 hours or as directed by MD     [provider]  Loratadine-Pseudoephedrine (CLARITIN-D 24 HOUR PO) Take 1 tablet by mouth daily as needed (for allergies/congestion.).     [provider]  methocarbamol (ROBAXIN) 500 MG tablet Take 1,000 mg by mouth every 12 (twelve) hours.     [provider]  Multiple Vitamins-Minerals (PRESERVISION AREDS 2 PO) Take 1 tablet by mouth daily at 2 PM.    [provider]  omeprazole (PRILOSEC) 20 MG capsule Take 20 mg by mouth at bedtime.  02/03/16   [provider]  oxyCODONE (OXYCONTIN) 20 MG 12 hr tablet Take 20 mg by mouth every 12 (twelve) hours.    [provider]  Probiotic Product (PROBIOTIC PO) Take 1 tablet by mouth daily with breakfast. Nature's Bounty 20 million culture.    [provider]  vitamin E 400 UNIT capsule Take 400 Units by mouth daily at 2 PM.     [provider]    Physical Exam: Vitals:   02/10/18 2200 02/10/18 2215 02/10/18 2230 02/10/18 2300  BP: (!) 153/76 (!) 154/98 (!) 172/75 (!) 162/93  Pulse: 84 83 80 89    Resp: 13 17 11 15   Temp:      SpO2: 100% 100% 99% 99%  Weight:      Height:        Constitutional: NAD, calm, comfortable Eyes: PERRL, lids and conjunctivae normal ENMT: Mucous membranes are moist. Posterior pharynx clear of any exudate or lesions.Normal dentition.  Neck: normal, supple, no masses, no thyromegaly Respiratory: clear to auscultation bilaterally, no wheezing, no crackles. Normal respiratory effort. No accessory muscle use.  Cardiovascular: Regular rate and rhythm, no murmurs / rubs / gallops. No extremity edema. 2+ pedal pulses. No carotid bruits.  Abdomen: Distended, diffuse tenderness. Musculoskeletal: no clubbing /  cyanosis. No joint deformity upper and lower extremities. Good ROM, no contractures. Normal muscle tone.  Skin: no rashes, lesions, ulcers. No induration Neurologic: CN 2-12 grossly intact. Sensation intact, DTR normal. Strength 5/5 in all 4.  Psychiatric: Normal judgment and insight. Alert and oriented x 3. Normal mood.    Labs on Admission: I have personally reviewed following labs and imaging studies  CBC: Recent Labs  Lab 02/10/18 1710  WBC 6.5  NEUTROABS 4.6  HGB 13.0  HCT 39.7  MCV 89.6  PLT 742   Basic Metabolic Panel: Recent Labs  Lab 02/10/18 1710  NA 138  K 4.1  CL 100  CO2 30  GLUCOSE 140*  BUN 11  CREATININE 1.16  CALCIUM 9.8   GFR: Estimated Creatinine Clearance: 55.1 mL/min (by C-G formula based on SCr of 1.16 mg/dL). Liver Function Tests: Recent Labs  Lab 02/10/18 1710  AST 42*  ALT 37  ALKPHOS 71  BILITOT 0.9  PROT 7.5  ALBUMIN 4.1   Recent Labs  Lab 02/10/18 1710  LIPASE 30   No results for input(s): AMMONIA in the last 168 hours. Coagulation Profile: No results for input(s): INR, PROTIME in the last 168 hours. Cardiac Enzymes: No results for input(s): CKTOTAL, CKMB, CKMBINDEX, TROPONINI in the last 168 hours. BNP (last 3 results) No results for input(s): PROBNP in the last 8760 hours. HbA1C: No  results for input(s): HGBA1C in the last 72 hours. CBG: No results for input(s): GLUCAP in the last 168 hours. Lipid Profile: No results for input(s): CHOL, HDL, LDLCALC, TRIG, CHOLHDL, LDLDIRECT in the last 72 hours. Thyroid Function Tests: No results for input(s): TSH, T4TOTAL, FREET4, T3FREE, THYROIDAB in the last 72 hours. Anemia Panel: No results for input(s): VITAMINB12, FOLATE, FERRITIN, TIBC, IRON, RETICCTPCT in the last 72 hours. Urine analysis:    Component Value Date/Time   COLORURINE YELLOW 02/10/2018 2016   APPEARANCEUR CLEAR 02/10/2018 2016   LABSPEC 1.008 02/10/2018 2016   PHURINE 5.0 02/10/2018 2016   GLUCOSEU NEGATIVE 02/10/2018 2016   HGBUR NEGATIVE 02/10/2018 2016   BILIRUBINUR NEGATIVE 02/10/2018 2016   KETONESUR NEGATIVE 02/10/2018 2016   PROTEINUR NEGATIVE 02/10/2018 2016   UROBILINOGEN 0.2 10/04/2014 2223   NITRITE NEGATIVE 02/10/2018 2016   LEUKOCYTESUR NEGATIVE 02/10/2018 2016    Radiological Exams on Admission: Ct Abdomen Pelvis W Contrast  Result Date: 02/10/2018 CLINICAL DATA:  Bowel obstruction multiple surgery EXAM: CT ABDOMEN AND PELVIS WITH CONTRAST TECHNIQUE: Multidetector CT imaging of the abdomen and pelvis was performed using the standard protocol following bolus administration of intravenous contrast. CONTRAST:  139mL ISOVUE-300 IOPAMIDOL (ISOVUE-300) INJECTION 61% COMPARISON:  12/30/2016, CT 12/29/2016, 09/01/2016, 02/13/2016 FINDINGS: Lower chest: Lung bases demonstrate no acute consolidation or effusion. The heart size is normal. Hepatobiliary: Status post cholecystectomy. Stable mild intra and extrahepatic biliary dilatation. Pancreas: Unremarkable. No pancreatic ductal dilatation or surrounding inflammatory changes. Atrophic Spleen: Normal in size without focal abnormality. Adrenals/Urinary Tract: Adrenal glands are unremarkable. Kidneys are normal, without renal calculi, focal lesion, or hydronephrosis. Bladder is unremarkable. Stomach/Bowel:  Stomach is nonenlarged. Dilated fluid-filled distal small bowel measuring up to 4 cm. Status post subtotal colectomy. Transition point visualized within the upper pelvis near the midline where there is swirled appearance of mesentery and vasculature, this is best seen on sagittal images series 7 image 49 through 51. No bowel wall thickening. No pneumatosis. Vascular/Lymphatic: Moderate aortic atherosclerosis. No aneurysm. No significantly enlarged lymph nodes. Reproductive: Slightly enlarged prostate with calcification Other: Negative for free air or free  fluid.  Ventral scarring Musculoskeletal: No acute or significant osseous findings. IMPRESSION: 1. Status post subtotal colectomy with findings consistent with distal small-bowel obstruction. Transition point appears to be within the upper pelvis where there is swirled appearance of mesentery and vascular structures, possibly representing mesenteric twist/volvulus or internal hernia. No pneumatosis or free air. 2. Status post cholecystectomy with stable mild intra and extrahepatic biliary dilatation. Electronically Signed   By: Donavan Foil M.D.   On: 02/10/2018 19:48    EKG: Independently reviewed.  Assessment/Plan Principal Problem:   SBO (small bowel obstruction) (HCC) Active Problems:   H/O abdominal surgery    1. SBO - with volvulus 1. See surgery consult note 2. NPO 3. IVF 4. NGT and gastrografin SBO pathway 5. PRN morphine for pain control 6. Possible surgery if no improvement in next 12-24h per surgery consult note 2. Chronic back pain - 1. Will put on PRN morphine IV for pain control while patient NPO  DVT prophylaxis: SCDs - possible need for surgery Code Status: Full Family Communication: Wife at bedside Disposition Plan: Home after admit Consults called: Dr. Brantley Stage Admission status: Admit to inpatient   Etta Quill DO [facility] Pager [phone] Only works nights!  If 7AM-7PM, please contact the primary  day team physician taking care of patient  [email or web address] Password [facility]  02/10/2018, 11:25 PM

## 2018-02-11 ENCOUNTER — Inpatient Hospital Stay (HOSPITAL_COMMUNITY): Payer: Medicare Other

## 2018-02-11 ENCOUNTER — Other Ambulatory Visit: Payer: Self-pay

## 2018-02-11 DIAGNOSIS — G894 Chronic pain syndrome: Secondary | ICD-10-CM

## 2018-02-11 LAB — BASIC METABOLIC PANEL
ANION GAP: 5 (ref 5–15)
BUN: 10 mg/dL (ref 8–23)
CO2: 30 mmol/L (ref 22–32)
Calcium: 9.1 mg/dL (ref 8.9–10.3)
Chloride: 103 mmol/L (ref 98–111)
Creatinine, Ser: 0.96 mg/dL (ref 0.61–1.24)
GFR calc Af Amer: >60 mL/min (ref >60)
Glucose, Bld: 100 mg/dL — ABNORMAL HIGH (ref 70–99)
POTASSIUM: 4.5 mmol/L (ref 3.5–5.1)
SODIUM: 138 mmol/L (ref 135–145)

## 2018-02-11 MED ORDER — PHENOL 1.4 % MT LIQD
1.0000 | OROMUCOSAL | Status: DC | PRN
  Filled 2018-02-11: qty 177

## 2018-02-11 MED ORDER — HYDROCODONE-ACETAMINOPHEN 10-325 MG PO TABS
1.0000 | ORAL_TABLET | Freq: Four times a day (QID) | ORAL | Status: DC | PRN
  Administered 2018-02-12 – 2018-02-16 (×7): 1 via ORAL
  Filled 2018-02-11 (×8): qty 1

## 2018-02-11 MED ORDER — METHOCARBAMOL 500 MG PO TABS
1000.0000 mg | ORAL_TABLET | Freq: Four times a day (QID) | ORAL | Status: DC | PRN
  Administered 2018-02-11 – 2018-02-18 (×15): 1000 mg via ORAL
  Filled 2018-02-11 (×16): qty 2

## 2018-02-11 MED ORDER — MORPHINE SULFATE ER 15 MG PO TBCR
30.0000 mg | EXTENDED_RELEASE_TABLET | Freq: Two times a day (BID) | ORAL | Status: DC
  Administered 2018-02-11 – 2018-02-18 (×15): 30 mg via ORAL
  Filled 2018-02-11 (×15): qty 2

## 2018-02-11 MED ORDER — FAMOTIDINE IN NACL 20-0.9 MG/50ML-% IV SOLN
20.0000 mg | INTRAVENOUS | Status: DC
  Administered 2018-02-11: 20 mg via INTRAVENOUS
  Filled 2018-02-11: qty 50

## 2018-02-11 NOTE — Progress Notes (Signed)
Per Grandville Silos MD, okay for pt to NOT have NGT at this time.

## 2018-02-11 NOTE — Progress Notes (Signed)
Received pt alert and oriented. Pt ambulated to room, with some nausea and mild abdominal pain. Oriented pt with room. Will monitor pt.

## 2018-02-11 NOTE — Progress Notes (Signed)
Attempted NG tube insertion, unsuccessful. Pt refusing NG insertion at this time. Pt requesting to have it done in radiology.  MD made aware.

## 2018-02-11 NOTE — Progress Notes (Signed)
Subjective/Chief Complaint: Passed a large amount of gas two times, no BM, much less pain   Objective: Vital signs in last 24 hours: Temp:  [97.6 F (36.4 C)-98.3 F (36.8 C)] 98.3 F (36.8 C) (08/22 0425) Pulse Rate:  [74-89] 78 (08/22 0425) Resp:  [11-18] 18 (08/22 0425) BP: (103-172)/(70-98) 163/73 (08/22 0425) SpO2:  [96 %-100 %] 96 % (08/22 0425) Weight:  [70.8 kg-72.9 kg] 72.9 kg (08/22 0041) Last BM Date: 02/10/18  Intake/Output from previous day: 08/21 0701 - 08/22 0700 In: 196.4 [I.V.:196.4] Out: -  Intake/Output this shift: No intake/output data recorded.  A&O Lungs CTA CV RRR Abd soft, mod dist, mild tend Ext calves soft  Lab Results:  Recent Labs    02/10/18 1710  WBC 6.5  HGB 13.0  HCT 39.7  PLT 224   BMET Recent Labs    02/10/18 1710 02/11/18 0625  NA 138 138  K 4.1 4.5  CL 100 103  CO2 30 30  GLUCOSE 140* 100*  BUN 11 10  CREATININE 1.16 0.96  CALCIUM 9.8 9.1   PT/INR No results for input(s): LABPROT, INR in the last 72 hours. ABG No results for input(s): PHART, HCO3 in the last 72 hours.  Invalid input(s): PCO2, PO2  Studies/Results: Ct Abdomen Pelvis W Contrast  Result Date: 02/10/2018 CLINICAL DATA:  Bowel obstruction multiple surgery EXAM: CT ABDOMEN AND PELVIS WITH CONTRAST TECHNIQUE: Multidetector CT imaging of the abdomen and pelvis was performed using the standard protocol following bolus administration of intravenous contrast. CONTRAST:  167mL ISOVUE-300 IOPAMIDOL (ISOVUE-300) INJECTION 61% COMPARISON:  12/30/2016, CT 12/29/2016, 09/01/2016, 02/13/2016 FINDINGS: Lower chest: Lung bases demonstrate no acute consolidation or effusion. The heart size is normal. Hepatobiliary: Status post cholecystectomy. Stable mild intra and extrahepatic biliary dilatation. Pancreas: Unremarkable. No pancreatic ductal dilatation or surrounding inflammatory changes. Atrophic Spleen: Normal in size without focal abnormality. Adrenals/Urinary  Tract: Adrenal glands are unremarkable. Kidneys are normal, without renal calculi, focal lesion, or hydronephrosis. Bladder is unremarkable. Stomach/Bowel: Stomach is nonenlarged. Dilated fluid-filled distal small bowel measuring up to 4 cm. Status post subtotal colectomy. Transition point visualized within the upper pelvis near the midline where there is swirled appearance of mesentery and vasculature, this is best seen on sagittal images series 7 image 49 through 51. No bowel wall thickening. No pneumatosis. Vascular/Lymphatic: Moderate aortic atherosclerosis. No aneurysm. No significantly enlarged lymph nodes. Reproductive: Slightly enlarged prostate with calcification Other: Negative for free air or free fluid.  Ventral scarring Musculoskeletal: No acute or significant osseous findings. IMPRESSION: 1. Status post subtotal colectomy with findings consistent with distal small-bowel obstruction. Transition point appears to be within the upper pelvis where there is swirled appearance of mesentery and vascular structures, possibly representing mesenteric twist/volvulus or internal hernia. No pneumatosis or free air. 2. Status post cholecystectomy with stable mild intra and extrahepatic biliary dilatation. Electronically Signed   By: Donavan Foil M.D.   On: 02/10/2018 19:48   Dg Abd Portable 1v-small Bowel Protocol-position Verification  Result Date: 02/10/2018 CLINICAL DATA:  NG tube placement EXAM: PORTABLE ABDOMEN - 1 VIEW COMPARISON:  CT 02/10/2018 FINDINGS: No esophageal tube is visualized within the abdomen. Multiple dilated loops of bowel up to 5.5 cm consistent with a bowel obstruction. IMPRESSION: 1. No esophageal tube is visualized over the abdomen or lower chest. 2. Multiple loops of dilated bowel, consistent with a bowel obstruction. Electronically Signed   By: Donavan Foil M.D.   On: 02/10/2018 23:43    Anti-infectives: Anti-infectives (From admission,  onward)   None       Assessment/Plan: SBO Hx mult abdominal surgeries, last was LOA 9/18  Seems to have improved overnight. Despite multiple attempts, NGT could not be placed. Will repeat films, hold off on protocol in light of flatus.  VTE - PAS FEN - ice chips Chronic back pain - home MS Contin Dispo - above  LOS: 1 day    Zenovia Jarred 02/11/2018

## 2018-02-11 NOTE — Progress Notes (Signed)
Patient ID: Kent [name], male   DOB: [birth year], 77 y.o.   MRN: [id number] +BM Tolerating clears Plan fulls in AM I spoke with his wife.  Georganna Skeans, MD, MPH, FACS Trauma: [phone] General Surgery: [phone]

## 2018-02-11 NOTE — Progress Notes (Signed)
PROGRESS NOTE  Kent PIATEK OIZ:[id number] DOB: June 07, 1941 DOA: 02/10/2018 PCP: Shirline Frees, MD  HPI/Recap of past 24 hours: Kent [name] is a 77 y.o. 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 Last admit for SBO was in July 2018. Patient presents to ED c/o 1 day h/o worsening abd distention, N/V, abd pain which is diffuse. In the ED, CT confirms SBO, suspicious for volvulus or internal hernia. General surgery consulted. Pt admitted for further management.  Today, patient reported passing frequent gas, also reported one bowel movement today.  Patient denies any further nausea/vomiting, abdomen still mildly TTP.  Patient denies any chest pain, shortness of breath, fever/chills.  Assessment/Plan: Principal Problem:   SBO (small bowel obstruction) (HCC) Active Problems:   H/O abdominal surgery  SBO Reported passing gas and an episode of BM today History of multiple abdominal surgeries in the past Despite multiple attempts, NG tube could not be placed CT abdomen showed: Status post subtotal colectomy with findings consistent with distal small-bowel obstruction  General surgery on board: Repeat abdominal x-ray, CLD for now Monitor closely  Chronic pain syndrome Continue MS Contin    Code Status: Full  Family Communication: None at bedside  Disposition Plan: Home once significant clinical improvement   Consultants: General surgery  Procedures:  None  Antimicrobials:  None  DVT prophylaxis: SCDs   Objective: Vitals:   02/10/18 2230 02/10/18 2300 02/11/18 0041 02/11/18 0425  BP: (!) 172/75 (!) 162/93 (!) 167/94 (!) 163/73  Pulse: 80 89 86 78  Resp: 11 15 18 18   Temp:   98.2 F (36.8 C) 98.3 F (36.8 C)  TempSrc:   Oral Oral  SpO2: 99% 99% 100% 96%  Weight:   72.9 kg   Height:   5\' 11"  (1.803 m)     Intake/Output Summary (Last 24 hours) at  02/11/2018 1314 Last data filed at 02/11/2018 1300 Gross per 24 hour  Intake 1381.31 ml  Output -  Net 1381.31 ml   Filed Weights   02/10/18 1709 02/11/18 0041  Weight: 70.8 kg 72.9 kg    Exam:   General: NAD  Cardiovascular: S1, S2 present  Respiratory: CTA B  Abdomen: Soft, moderately distended, mild TTP, BS present  Musculoskeletal: No pedal edema bilaterally  Skin: Normal  Psychiatry: Normal mood   Data Reviewed: CBC: Recent Labs  Lab 02/10/18 1710  WBC 6.5  NEUTROABS 4.6  HGB 13.0  HCT 39.7  MCV 89.6  PLT 338   Basic Metabolic Panel: Recent Labs  Lab 02/10/18 1710 02/11/18 0625  NA 138 138  K 4.1 4.5  CL 100 103  CO2 30 30  GLUCOSE 140* 100*  BUN 11 10  CREATININE 1.16 0.96  CALCIUM 9.8 9.1   GFR: Estimated Creatinine Clearance: 68.6 mL/min (by C-G formula based on SCr of 0.96 mg/dL). Liver Function Tests: Recent Labs  Lab 02/10/18 1710  AST 42*  ALT 37  ALKPHOS 71  BILITOT 0.9  PROT 7.5  ALBUMIN 4.1   Recent Labs  Lab 02/10/18 1710  LIPASE 30   No results for input(s): AMMONIA in the last 168 hours. Coagulation Profile: No results for input(s): INR, PROTIME in the last 168 hours. Cardiac Enzymes: No results for input(s): CKTOTAL, CKMB, CKMBINDEX, TROPONINI in the last 168 hours. BNP (last 3 results) No results for input(s): PROBNP in the last 8760 hours. HbA1C: No results for input(s): HGBA1C in the  last 72 hours. CBG: No results for input(s): GLUCAP in the last 168 hours. Lipid Profile: No results for input(s): CHOL, HDL, LDLCALC, TRIG, CHOLHDL, LDLDIRECT in the last 72 hours. Thyroid Function Tests: No results for input(s): TSH, T4TOTAL, FREET4, T3FREE, THYROIDAB in the last 72 hours. Anemia Panel: No results for input(s): VITAMINB12, FOLATE, FERRITIN, TIBC, IRON, RETICCTPCT in the last 72 hours. Urine analysis:    Component Value Date/Time   COLORURINE YELLOW 02/10/2018 2016   APPEARANCEUR CLEAR 02/10/2018 2016    LABSPEC 1.008 02/10/2018 2016   PHURINE 5.0 02/10/2018 2016   GLUCOSEU NEGATIVE 02/10/2018 2016   HGBUR NEGATIVE 02/10/2018 2016   BILIRUBINUR NEGATIVE 02/10/2018 2016   KETONESUR NEGATIVE 02/10/2018 2016   PROTEINUR NEGATIVE 02/10/2018 2016   UROBILINOGEN 0.2 10/04/2014 2223   NITRITE NEGATIVE 02/10/2018 2016   LEUKOCYTESUR NEGATIVE 02/10/2018 2016   Sepsis Labs: @LABRCNTIP (procalcitonin:4,lacticidven:4)  )No results found for this or any previous visit (from the past 240 hour(s)).    Studies: Ct Abdomen Pelvis W Contrast  Result Date: 02/10/2018 CLINICAL DATA:  Bowel obstruction multiple surgery EXAM: CT ABDOMEN AND PELVIS WITH CONTRAST TECHNIQUE: Multidetector CT imaging of the abdomen and pelvis was performed using the standard protocol following bolus administration of intravenous contrast. CONTRAST:  167mL ISOVUE-300 IOPAMIDOL (ISOVUE-300) INJECTION 61% COMPARISON:  12/30/2016, CT 12/29/2016, 09/01/2016, 02/13/2016 FINDINGS: Lower chest: Lung bases demonstrate no acute consolidation or effusion. The heart size is normal. Hepatobiliary: Status post cholecystectomy. Stable mild intra and extrahepatic biliary dilatation. Pancreas: Unremarkable. No pancreatic ductal dilatation or surrounding inflammatory changes. Atrophic Spleen: Normal in size without focal abnormality. Adrenals/Urinary Tract: Adrenal glands are unremarkable. Kidneys are normal, without renal calculi, focal lesion, or hydronephrosis. Bladder is unremarkable. Stomach/Bowel: Stomach is nonenlarged. Dilated fluid-filled distal small bowel measuring up to 4 cm. Status post subtotal colectomy. Transition point visualized within the upper pelvis near the midline where there is swirled appearance of mesentery and vasculature, this is best seen on sagittal images series 7 image 49 through 51. No bowel wall thickening. No pneumatosis. Vascular/Lymphatic: Moderate aortic atherosclerosis. No aneurysm. No significantly enlarged lymph  nodes. Reproductive: Slightly enlarged prostate with calcification Other: Negative for free air or free fluid.  Ventral scarring Musculoskeletal: No acute or significant osseous findings. IMPRESSION: 1. Status post subtotal colectomy with findings consistent with distal small-bowel obstruction. Transition point appears to be within the upper pelvis where there is swirled appearance of mesentery and vascular structures, possibly representing mesenteric twist/volvulus or internal hernia. No pneumatosis or free air. 2. Status post cholecystectomy with stable mild intra and extrahepatic biliary dilatation. Electronically Signed   By: Donavan Foil M.D.   On: 02/10/2018 19:48   Dg Abd 2 Views  Result Date: 02/11/2018 CLINICAL DATA:  Small bowel obstruction EXAM: ABDOMEN - 2 VIEW COMPARISON:  02/10/2018 FINDINGS: Diffuse gaseous distention of bowel, most pronounced involving the small bowel concerning for small bowel obstruction. Prior cholecystectomy. No free air organomegaly. No significant change since prior study. IMPRESSION: Distal small bowel obstruction pattern again noted, not significantly changed. Electronically Signed   By: Rolm Baptise M.D.   On: 02/11/2018 09:36   Dg Abd Portable 1v-small Bowel Protocol-position Verification  Result Date: 02/10/2018 CLINICAL DATA:  NG tube placement EXAM: PORTABLE ABDOMEN - 1 VIEW COMPARISON:  CT 02/10/2018 FINDINGS: No esophageal tube is visualized within the abdomen. Multiple dilated loops of bowel up to 5.5 cm consistent with a bowel obstruction. IMPRESSION: 1. No esophageal tube is visualized over the abdomen or lower chest. 2. Multiple  loops of dilated bowel, consistent with a bowel obstruction. Electronically Signed   By: Donavan Foil M.D.   On: 02/10/2018 23:43    Scheduled Meds: . diatrizoate meglumine-sodium  90 mL Per NG tube Once  . morphine  30 mg Oral Q12H    Continuous Infusions: . sodium chloride 100 mL/hr at 02/11/18 1300  . famotidine  (PEPCID) IV Stopped (02/11/18 0905)     LOS: 1 day     Alma Friendly, MD [facility]   If 7PM-7AM, please contact night-coverage [email or web address] Password [email or web address] 02/11/2018, 1:14 PM

## 2018-02-11 NOTE — Care Management Note (Signed)
Case Management Note  Patient Details  Name: ROEN MACGOWAN MRN: [id number] Date of Birth: Dec 07, 1940  Subjective/Objective:    SBO               Action/Plan: Patient lives at home; PCP: Shirline Frees, MD; has private insurance with Medicare; CM will continue to follow for progression of care.   Expected Discharge Date:  02/18/18               Expected Discharge Plan:   Home  Status of Service:   In progress  Sherrilyn Rist [phone] 02/11/2018, 2:31 PM

## 2018-02-12 LAB — BASIC METABOLIC PANEL WITH GFR
Anion gap: 6 (ref 5–15)
BUN: 7 mg/dL — ABNORMAL LOW (ref 8–23)
CO2: 28 mmol/L (ref 22–32)
Calcium: 8.8 mg/dL — ABNORMAL LOW (ref 8.9–10.3)
Chloride: 103 mmol/L (ref 98–111)
Creatinine, Ser: 1.02 mg/dL (ref 0.61–1.24)
GFR calc Af Amer: >60 mL/min
GFR calc non Af Amer: >60 mL/min
Glucose, Bld: 109 mg/dL — ABNORMAL HIGH (ref 70–99)
Potassium: 4.1 mmol/L (ref 3.5–5.1)
Sodium: 137 mmol/L (ref 135–145)

## 2018-02-12 LAB — CBC
HCT: 35.1 % — ABNORMAL LOW (ref 39.0–52.0)
HEMOGLOBIN: 11.6 g/dL — AB (ref 13.0–17.0)
MCH: 29.1 pg (ref 26.0–34.0)
MCHC: 33 g/dL (ref 30.0–36.0)
MCV: 88.2 fL (ref 78.0–100.0)
Platelets: 174 10*3/uL (ref 150–400)
RBC: 3.98 MIL/uL — AB (ref 4.22–5.81)
RDW: 12.6 % (ref 11.5–15.5)
WBC: 4.5 10*3/uL (ref 4.0–10.5)

## 2018-02-12 MED ORDER — FAMOTIDINE 20 MG PO TABS
20.0000 mg | ORAL_TABLET | Freq: Every day | ORAL | Status: DC
  Administered 2018-02-12 – 2018-02-18 (×7): 20 mg via ORAL
  Filled 2018-02-12 (×7): qty 1

## 2018-02-12 MED ORDER — ENOXAPARIN SODIUM 40 MG/0.4ML ~~LOC~~ SOLN
40.0000 mg | SUBCUTANEOUS | Status: DC
  Administered 2018-02-12 – 2018-02-17 (×6): 40 mg via SUBCUTANEOUS
  Filled 2018-02-12 (×6): qty 0.4

## 2018-02-12 NOTE — Care Management Important Message (Signed)
Important Message  Patient Details  Name: Kent [name] MRN: [id number] Date of Birth: May 20, 1941   Medicare Important Message Given:  Yes    [name] 02/12/2018, 3:46 PM

## 2018-02-12 NOTE — Progress Notes (Signed)
Subjective/Chief Complaint: Another liquid BM earlier this AM, less pain   Objective: Vital signs in last 24 hours: Temp:  [98.2 F (36.8 C)-98.7 F (37.1 C)] 98.7 F (37.1 C) (08/22 2255) Pulse Rate:  [73-76] 76 (08/22 2255) Resp:  [14-16] 14 (08/22 2255) BP: (129-148)/(52-74) 148/52 (08/22 2255) SpO2:  [100 %] 100 % (08/22 2255) Last BM Date: 02/11/18  Intake/Output from previous day: 08/22 0701 - 08/23 0700 In: 2884.9 [P.O.:522; I.V.:2312.8; IV Piggyback:50] Out: -  Intake/Output this shift: Total I/O In: 900 [I.V.:900] Out: -   General appearance: alert and cooperative Resp: clear to auscultation bilaterally Cardio: regular rate and rhythm GI: soft, moderate distention, mild tenderness  Lab Results:  Recent Labs    02/10/18 1710  WBC 6.5  HGB 13.0  HCT 39.7  PLT 224   BMET Recent Labs    02/10/18 1710 02/11/18 0625  NA 138 138  K 4.1 4.5  CL 100 103  CO2 30 30  GLUCOSE 140* 100*  BUN 11 10  CREATININE 1.16 0.96  CALCIUM 9.8 9.1   PT/INR No results for input(s): LABPROT, INR in the last 72 hours. ABG No results for input(s): PHART, HCO3 in the last 72 hours.  Invalid input(s): PCO2, PO2  Studies/Results: Ct Abdomen Pelvis W Contrast  Result Date: 02/10/2018 CLINICAL DATA:  Bowel obstruction multiple surgery EXAM: CT ABDOMEN AND PELVIS WITH CONTRAST TECHNIQUE: Multidetector CT imaging of the abdomen and pelvis was performed using the standard protocol following bolus administration of intravenous contrast. CONTRAST:  156mL ISOVUE-300 IOPAMIDOL (ISOVUE-300) INJECTION 61% COMPARISON:  12/30/2016, CT 12/29/2016, 09/01/2016, 02/13/2016 FINDINGS: Lower chest: Lung bases demonstrate no acute consolidation or effusion. The heart size is normal. Hepatobiliary: Status post cholecystectomy. Stable mild intra and extrahepatic biliary dilatation. Pancreas: Unremarkable. No pancreatic ductal dilatation or surrounding inflammatory changes. Atrophic Spleen:  Normal in size without focal abnormality. Adrenals/Urinary Tract: Adrenal glands are unremarkable. Kidneys are normal, without renal calculi, focal lesion, or hydronephrosis. Bladder is unremarkable. Stomach/Bowel: Stomach is nonenlarged. Dilated fluid-filled distal small bowel measuring up to 4 cm. Status post subtotal colectomy. Transition point visualized within the upper pelvis near the midline where there is swirled appearance of mesentery and vasculature, this is best seen on sagittal images series 7 image 49 through 51. No bowel wall thickening. No pneumatosis. Vascular/Lymphatic: Moderate aortic atherosclerosis. No aneurysm. No significantly enlarged lymph nodes. Reproductive: Slightly enlarged prostate with calcification Other: Negative for free air or free fluid.  Ventral scarring Musculoskeletal: No acute or significant osseous findings. IMPRESSION: 1. Status post subtotal colectomy with findings consistent with distal small-bowel obstruction. Transition point appears to be within the upper pelvis where there is swirled appearance of mesentery and vascular structures, possibly representing mesenteric twist/volvulus or internal hernia. No pneumatosis or free air. 2. Status post cholecystectomy with stable mild intra and extrahepatic biliary dilatation. Electronically Signed   By: Donavan Foil M.D.   On: 02/10/2018 19:48   Dg Abd 2 Views  Result Date: 02/11/2018 CLINICAL DATA:  Small bowel obstruction EXAM: ABDOMEN - 2 VIEW COMPARISON:  02/10/2018 FINDINGS: Diffuse gaseous distention of bowel, most pronounced involving the small bowel concerning for small bowel obstruction. Prior cholecystectomy. No free air organomegaly. No significant change since prior study. IMPRESSION: Distal small bowel obstruction pattern again noted, not significantly changed. Electronically Signed   By: Rolm Baptise M.D.   On: 02/11/2018 09:36   Dg Abd Portable 1v-small Bowel Protocol-position Verification  Result Date:  02/10/2018 CLINICAL DATA:  NG tube placement  EXAM: PORTABLE ABDOMEN - 1 VIEW COMPARISON:  CT 02/10/2018 FINDINGS: No esophageal tube is visualized within the abdomen. Multiple dilated loops of bowel up to 5.5 cm consistent with a bowel obstruction. IMPRESSION: 1. No esophageal tube is visualized over the abdomen or lower chest. 2. Multiple loops of dilated bowel, consistent with a bowel obstruction. Electronically Signed   By: Donavan Foil M.D.   On: 02/10/2018 23:43    Anti-infectives: Anti-infectives (From admission, onward)   None      Assessment/Plan: SBO Hx mult abdominal surgeries, last was LOA 9/18  Improving, fulls and hope to advance by tomorrow  Chronic back pain - home MS Contin Dispo - above  LOS: 2 days    Kent [name] 02/12/2018

## 2018-02-12 NOTE — Progress Notes (Signed)
PROGRESS NOTE  Kent [name] BPZ:[id number] DOB: 06/02/1941 DOA: 02/10/2018 PCP: Shirline Frees, MD  HPI/Recap of past 24 hours: Kent [name] is a 77 y.o. 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 Last admit for SBO was in July 2018. Patient presents to ED c/o 1 day h/o worsening abd distention, N/V, abd pain which is diffuse. In the ED, CT confirms SBO, suspicious for volvulus or internal hernia. General surgery consulted. Pt admitted for further management.  Today, patient reported having 1 loose BM this am, still with some mild abdominal pain. Patient denies any new complaints  Assessment/Plan: Principal Problem:   SBO (small bowel obstruction) (HCC) Active Problems:   H/O abdominal surgery  SBO Improving History of multiple abdominal surgeries in the past Despite multiple attempts, NG tube could not be placed CT abdomen showed: Status post subtotal colectomy with findings consistent with distal small-bowel obstruction  General surgery on board: Advanced to FLD Monitor closely  Chronic pain syndrome Continue MS Contin    Code Status: Full  Family Communication: None at bedside  Disposition Plan: Home once significant clinical improvement   Consultants: General surgery  Procedures:  None  Antimicrobials:  None  DVT prophylaxis: Lovenox   Objective: Vitals:   02/11/18 0425 02/11/18 1345 02/11/18 2255 02/12/18 1302  BP: (!) 163/73 129/74 (!) 148/52 135/62  Pulse: 78 73 76 61  Resp: 18 16 14 14   Temp: 98.3 F (36.8 C) 98.2 F (36.8 C) 98.7 F (37.1 C) 98.1 F (36.7 C)  TempSrc: Oral Oral Oral Oral  SpO2: 96% 100% 100% 100%  Weight:      Height:        Intake/Output Summary (Last 24 hours) at 02/12/2018 1525 Last data filed at 02/12/2018 1400 Gross per 24 hour  Intake 1799.97 ml  Output -  Net 1799.97 ml   Filed Weights   02/10/18 1709  02/11/18 0041  Weight: 70.8 kg 72.9 kg    Exam:   General: NAD  Cardiovascular: S1, S2 present  Respiratory: CTA B  Abdomen: Soft, distended, mild TTP, BS present  Musculoskeletal: No pedal edema bilaterally  Skin: Normal  Psychiatry: Normal mood   Data Reviewed: CBC: Recent Labs  Lab 02/10/18 1710 02/12/18 0551  WBC 6.5 4.5  NEUTROABS 4.6  --   HGB 13.0 11.6*  HCT 39.7 35.1*  MCV 89.6 88.2  PLT 224 242   Basic Metabolic Panel: Recent Labs  Lab 02/10/18 1710 02/11/18 0625 02/12/18 0551  NA 138 138 137  K 4.1 4.5 4.1  CL 100 103 103  CO2 30 30 28   GLUCOSE 140* 100* 109*  BUN 11 10 7*  CREATININE 1.16 0.96 1.02  CALCIUM 9.8 9.1 8.8*   GFR: Estimated Creatinine Clearance: 64.5 mL/min (by C-G formula based on SCr of 1.02 mg/dL). Liver Function Tests: Recent Labs  Lab 02/10/18 1710  AST 42*  ALT 37  ALKPHOS 71  BILITOT 0.9  PROT 7.5  ALBUMIN 4.1   Recent Labs  Lab 02/10/18 1710  LIPASE 30   No results for input(s): AMMONIA in the last 168 hours. Coagulation Profile: No results for input(s): INR, PROTIME in the last 168 hours. Cardiac Enzymes: No results for input(s): CKTOTAL, CKMB, CKMBINDEX, TROPONINI in the last 168 hours. BNP (last 3 results) No results for input(s): PROBNP in the last 8760 hours. HbA1C: No results for input(s): HGBA1C in the last 72 hours. CBG:  No results for input(s): GLUCAP in the last 168 hours. Lipid Profile: No results for input(s): CHOL, HDL, LDLCALC, TRIG, CHOLHDL, LDLDIRECT in the last 72 hours. Thyroid Function Tests: No results for input(s): TSH, T4TOTAL, FREET4, T3FREE, THYROIDAB in the last 72 hours. Anemia Panel: No results for input(s): VITAMINB12, FOLATE, FERRITIN, TIBC, IRON, RETICCTPCT in the last 72 hours. Urine analysis:    Component Value Date/Time   COLORURINE YELLOW 02/10/2018 2016   APPEARANCEUR CLEAR 02/10/2018 2016   LABSPEC 1.008 02/10/2018 2016   PHURINE 5.0 02/10/2018 2016    GLUCOSEU NEGATIVE 02/10/2018 2016   HGBUR NEGATIVE 02/10/2018 2016   BILIRUBINUR NEGATIVE 02/10/2018 2016   KETONESUR NEGATIVE 02/10/2018 2016   PROTEINUR NEGATIVE 02/10/2018 2016   UROBILINOGEN 0.2 10/04/2014 2223   NITRITE NEGATIVE 02/10/2018 2016   LEUKOCYTESUR NEGATIVE 02/10/2018 2016   Sepsis Labs: @LABRCNTIP (procalcitonin:4,lacticidven:4)  )No results found for this or any previous visit (from the past 240 hour(s)).    Studies: No results found.  Scheduled Meds: . famotidine  20 mg Oral Daily  . morphine  30 mg Oral Q12H    Continuous Infusions: . sodium chloride 100 mL/hr at 02/12/18 0510     LOS: 2 days     Alma Friendly, MD [facility]   If 7PM-7AM, please contact night-coverage [email or web address] Password [facility] 02/12/2018, 3:25 PM

## 2018-02-13 LAB — BASIC METABOLIC PANEL
ANION GAP: 7 (ref 5–15)
BUN: 7 mg/dL — ABNORMAL LOW (ref 8–23)
CO2: 28 mmol/L (ref 22–32)
Calcium: 8.8 mg/dL — ABNORMAL LOW (ref 8.9–10.3)
Chloride: 101 mmol/L (ref 98–111)
Creatinine, Ser: 1.01 mg/dL (ref 0.61–1.24)
GLUCOSE: 129 mg/dL — AB (ref 70–99)
POTASSIUM: 3.9 mmol/L (ref 3.5–5.1)
Sodium: 136 mmol/L (ref 135–145)

## 2018-02-13 NOTE — Progress Notes (Signed)
PROGRESS NOTE  Kent [name]:[id number] DOB: Apr 13, 1941 DOA: 02/10/2018 PCP: Shirline Frees, MD  HPI/Recap of past 24 hours: Kent [name] is a 77 y.o. 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 Last admit for SBO was in July 2018. Patient presents to ED c/o 1 day h/o worsening abd distention, N/V, abd pain which is diffuse. In the ED, CT confirms SBO, suspicious for volvulus or internal hernia. General surgery consulted. Pt admitted for further management.  Today, patient reported having a good BM last night. Still with distension and abdominal pain. Pt continues to ambulate.  Assessment/Plan: Principal Problem:   SBO (small bowel obstruction) (HCC) Active Problems:   H/O abdominal surgery  SBO Ongoing History of multiple abdominal surgeries in the past Despite multiple attempts, NG tube could not be placed CT abdomen showed: Status post subtotal colectomy with findings consistent with distal small-bowel obstruction  General surgery on board: Appreciate recs. Currently following patient clinically, advanced diet to soft Monitor closely  Chronic pain syndrome Continue MS Contin    Code Status: Full  Family Communication: None at bedside  Disposition Plan: Home once significant clinical improvement   Consultants: General surgery  Procedures:  None  Antimicrobials:  None  DVT prophylaxis: Lovenox   Objective: Vitals:   02/12/18 1302 02/12/18 2129 02/13/18 0501 02/13/18 1335  BP: 135/62 (!) 138/54 (!) 126/59 (!) 166/82  Pulse: 61 66 70 70  Resp: 14 17 17 18   Temp: 98.1 F (36.7 C) 97.8 F (36.6 C) 98.1 F (36.7 C) 98.1 F (36.7 C)  TempSrc: Oral Oral Oral Oral  SpO2: 100% 100% 100% 100%  Weight:      Height:        Intake/Output Summary (Last 24 hours) at 02/13/2018 1500 Last data filed at 02/13/2018 0617 Gross per 24 hour  Intake 2483.61  ml  Output -  Net 2483.61 ml   Filed Weights   02/10/18 1709 02/11/18 0041  Weight: 70.8 kg 72.9 kg    Exam:   General: NAD  Cardiovascular: S1, S2 present  Respiratory: CTA B  Abdomen: Soft, distended, mild TTP at lower abdomen, BS present  Musculoskeletal: No pedal edema bilaterally  Skin: Normal  Psychiatry: Normal mood   Data Reviewed: CBC: Recent Labs  Lab 02/10/18 1710 02/12/18 0551  WBC 6.5 4.5  NEUTROABS 4.6  --   HGB 13.0 11.6*  HCT 39.7 35.1*  MCV 89.6 88.2  PLT 224 287   Basic Metabolic Panel: Recent Labs  Lab 02/10/18 1710 02/11/18 0625 02/12/18 0551 02/13/18 0431  NA 138 138 137 136  K 4.1 4.5 4.1 3.9  CL 100 103 103 101  CO2 30 30 28 28   GLUCOSE 140* 100* 109* 129*  BUN 11 10 7* 7*  CREATININE 1.16 0.96 1.02 1.01  CALCIUM 9.8 9.1 8.8* 8.8*   GFR: Estimated Creatinine Clearance: 65.2 mL/min (by C-G formula based on SCr of 1.01 mg/dL). Liver Function Tests: Recent Labs  Lab 02/10/18 1710  AST 42*  ALT 37  ALKPHOS 71  BILITOT 0.9  PROT 7.5  ALBUMIN 4.1   Recent Labs  Lab 02/10/18 1710  LIPASE 30   No results for input(s): AMMONIA in the last 168 hours. Coagulation Profile: No results for input(s): INR, PROTIME in the last 168 hours. Cardiac Enzymes: No results for input(s): CKTOTAL, CKMB, CKMBINDEX, TROPONINI in the last 168 hours. BNP (last 3 results) No results  for input(s): PROBNP in the last 8760 hours. HbA1C: No results for input(s): HGBA1C in the last 72 hours. CBG: No results for input(s): GLUCAP in the last 168 hours. Lipid Profile: No results for input(s): CHOL, HDL, LDLCALC, TRIG, CHOLHDL, LDLDIRECT in the last 72 hours. Thyroid Function Tests: No results for input(s): TSH, T4TOTAL, FREET4, T3FREE, THYROIDAB in the last 72 hours. Anemia Panel: No results for input(s): VITAMINB12, FOLATE, FERRITIN, TIBC, IRON, RETICCTPCT in the last 72 hours. Urine analysis:    Component Value Date/Time   COLORURINE  YELLOW 02/10/2018 2016   APPEARANCEUR CLEAR 02/10/2018 2016   LABSPEC 1.008 02/10/2018 2016   PHURINE 5.0 02/10/2018 2016   GLUCOSEU NEGATIVE 02/10/2018 2016   HGBUR NEGATIVE 02/10/2018 2016   BILIRUBINUR NEGATIVE 02/10/2018 2016   KETONESUR NEGATIVE 02/10/2018 2016   PROTEINUR NEGATIVE 02/10/2018 2016   UROBILINOGEN 0.2 10/04/2014 2223   NITRITE NEGATIVE 02/10/2018 2016   LEUKOCYTESUR NEGATIVE 02/10/2018 2016   Sepsis Labs: @LABRCNTIP (procalcitonin:4,lacticidven:4)  )No results found for this or any previous visit (from the past 240 hour(s)).    Studies: No results found.  Scheduled Meds: . enoxaparin (LOVENOX) injection  40 mg Subcutaneous Q24H  . famotidine  20 mg Oral Daily  . morphine  30 mg Oral Q12H    Continuous Infusions: . sodium chloride 75 mL/hr at 02/13/18 0000     LOS: 3 days     Alma Friendly, MD [facility]   If 7PM-7AM, please contact night-coverage [email or web address] Password [email or web address] 02/13/2018, 3:00 PM

## 2018-02-13 NOTE — Progress Notes (Signed)
   Subjective/Chief Complaint: Complains of lower abd pain. Having bm's   Objective: Vital signs in last 24 hours: Temp:  [97.8 F (36.6 C)-98.1 F (36.7 C)] 98.1 F (36.7 C) (08/24 0501) Pulse Rate:  [61-70] 70 (08/24 0501) Resp:  [14-17] 17 (08/24 0501) BP: (126-138)/(54-62) 126/59 (08/24 0501) SpO2:  [100 %] 100 % (08/24 0501) Last BM Date: 02/12/18  Intake/Output from previous day: 08/23 0701 - 08/24 0700 In: 3083.6 [P.O.:930; I.V.:2153.6] Out: -  Intake/Output this shift: No intake/output data recorded.  General appearance: alert and cooperative Resp: clear to auscultation bilaterally Cardio: regular rate and rhythm GI: distended and mild to moderately tender  Lab Results:  Recent Labs    02/10/18 1710 02/12/18 0551  WBC 6.5 4.5  HGB 13.0 11.6*  HCT 39.7 35.1*  PLT 224 174   BMET Recent Labs    02/12/18 0551 02/13/18 0431  NA 137 136  K 4.1 3.9  CL 103 101  CO2 28 28  GLUCOSE 109* 129*  BUN 7* 7*  CREATININE 1.02 1.01  CALCIUM 8.8* 8.8*   PT/INR No results for input(s): LABPROT, INR in the last 72 hours. ABG No results for input(s): PHART, HCO3 in the last 72 hours.  Invalid input(s): PCO2, PO2  Studies/Results: No results found.  Anti-infectives: Anti-infectives (From admission, onward)   None      Assessment/Plan: s/p * No surgery found * Advance diet as he can tolerate Ambulate Dr. Grandville Silos prefers to follow clinically but if pain persists may need to repeat abd xrays  LOS: 3 days    TOTH III,PAUL S 02/13/2018

## 2018-02-13 NOTE — Progress Notes (Signed)
Patient verbalized he has abdominal pain with soft diet, requested back to full liquid diet-done

## 2018-02-14 ENCOUNTER — Inpatient Hospital Stay (HOSPITAL_COMMUNITY): Payer: Medicare Other

## 2018-02-14 LAB — CBC WITH DIFFERENTIAL/PLATELET
Abs Immature Granulocytes: 0 10*3/uL (ref 0.0–0.1)
BASOS PCT: 0 %
Basophils Absolute: 0 10*3/uL (ref 0.0–0.1)
EOS ABS: 0.1 10*3/uL (ref 0.0–0.7)
Eosinophils Relative: 3 %
HCT: 35.7 % — ABNORMAL LOW (ref 39.0–52.0)
Hemoglobin: 11.8 g/dL — ABNORMAL LOW (ref 13.0–17.0)
IMMATURE GRANULOCYTES: 0 %
Lymphocytes Relative: 22 %
Lymphs Abs: 0.9 10*3/uL (ref 0.7–4.0)
MCH: 29 pg (ref 26.0–34.0)
MCHC: 33.1 g/dL (ref 30.0–36.0)
MCV: 87.7 fL (ref 78.0–100.0)
Monocytes Absolute: 0.4 10*3/uL (ref 0.1–1.0)
Monocytes Relative: 9 %
NEUTROS PCT: 66 %
Neutro Abs: 2.7 10*3/uL (ref 1.7–7.7)
PLATELETS: 169 10*3/uL (ref 150–400)
RBC: 4.07 MIL/uL — AB (ref 4.22–5.81)
RDW: 12.5 % (ref 11.5–15.5)
WBC: 4 10*3/uL (ref 4.0–10.5)

## 2018-02-14 LAB — BASIC METABOLIC PANEL
ANION GAP: 6 (ref 5–15)
BUN: 6 mg/dL — ABNORMAL LOW (ref 8–23)
CALCIUM: 9 mg/dL (ref 8.9–10.3)
CO2: 30 mmol/L (ref 22–32)
Chloride: 98 mmol/L (ref 98–111)
Creatinine, Ser: 0.91 mg/dL (ref 0.61–1.24)
GLUCOSE: 117 mg/dL — AB (ref 70–99)
Potassium: 3.8 mmol/L (ref 3.5–5.1)
Sodium: 134 mmol/L — ABNORMAL LOW (ref 135–145)

## 2018-02-14 NOTE — Plan of Care (Signed)
°  Problem: Elimination: °Goal: Will not experience complications related to bowel motility °Outcome: Progressing °  °Problem: Pain Managment: °Goal: General experience of comfort will improve °Outcome: Progressing °  °

## 2018-02-14 NOTE — Progress Notes (Signed)
   Subjective/Chief Complaint: Complains of lower abdominal pain. Not able to eat   Objective: Vital signs in last 24 hours: Temp:  [98.1 F (36.7 C)-98.2 F (36.8 C)] 98.2 F (36.8 C) (08/25 0526) Pulse Rate:  [66-85] 85 (08/25 0526) Resp:  [16-18] 16 (08/25 0526) BP: (162-179)/(72-92) 167/92 (08/25 0526) SpO2:  [97 %-100 %] 97 % (08/25 0526) Last BM Date: 02/13/18  Intake/Output from previous day: 08/24 0701 - 08/25 0700 In: 951.2 [P.O.:480; I.V.:471.2] Out: -  Intake/Output this shift: Total I/O In: 850 [I.V.:850] Out: -   General appearance: alert and cooperative Resp: clear to auscultation bilaterally Cardio: regular rate and rhythm GI: distended and tender over lower abd  Lab Results:  Recent Labs    02/12/18 0551  WBC 4.5  HGB 11.6*  HCT 35.1*  PLT 174   BMET Recent Labs    02/12/18 0551 02/13/18 0431  NA 137 136  K 4.1 3.9  CL 103 101  CO2 28 28  GLUCOSE 109* 129*  BUN 7* 7*  CREATININE 1.02 1.01  CALCIUM 8.8* 8.8*   PT/INR No results for input(s): LABPROT, INR in the last 72 hours. ABG No results for input(s): PHART, HCO3 in the last 72 hours.  Invalid input(s): PCO2, PO2  Studies/Results: No results found.  Anti-infectives: Anti-infectives (From admission, onward)   None      Assessment/Plan: s/p * No surgery found * check lytes and wbc  Repeat abd xrays sbo   LOS: 4 days    TOTH III,Dontae Minerva S 02/14/2018

## 2018-02-14 NOTE — Progress Notes (Signed)
PROGRESS NOTE  QUILL GRINDER YDX:[id number] DOB: Jul 02, 1940 DOA: 02/10/2018 PCP: Shirline Frees, MD  HPI/Recap of past 24 hours: Kent [name] is a 77 y.o. 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 Last admit for SBO was in July 2018. Patient presents to ED c/o 1 day h/o worsening abd distention, N/V, abd pain which is diffuse. In the ED, CT confirms SBO, suspicious for volvulus or internal hernia. General surgery consulted. Pt admitted for further management.  Today, patient reported having more abdominal pain after advancing diet to soft. Diet changed to FLD. Still with distension.  Assessment/Plan: Principal Problem:   SBO (small bowel obstruction) (HCC) Active Problems:   H/O abdominal surgery  High grade partial SBO Ongoing History of multiple abdominal surgeries in the past Despite multiple attempts, NG tube could not be placed CT abdomen showed: Status post subtotal colectomy with findings consistent with distal small-bowel obstruction  Repeat Abd xray showed persistent high grade SBO General surgery on board: Appreciate recs Monitor closely  Chronic pain syndrome Continue MS Contin    Code Status: Full  Family Communication: None at bedside  Disposition Plan: Home once significant clinical improvement   Consultants: General surgery  Procedures:  None  Antimicrobials:  None  DVT prophylaxis: Lovenox   Objective: Vitals:   02/13/18 1335 02/13/18 2027 02/13/18 2030 02/14/18 0526  BP: (!) 166/82 (!) 179/73 (!) 162/72 (!) 167/92  Pulse: 70 70 66 85  Resp: 18 17  16   Temp: 98.1 F (36.7 C) 98.2 F (36.8 C)  98.2 F (36.8 C)  TempSrc: Oral Oral  Oral  SpO2: 100% 100% 100% 97%  Weight:      Height:        Intake/Output Summary (Last 24 hours) at 02/14/2018 1432 Last data filed at 02/14/2018 0900 Gross per 24 hour  Intake 1561.2 ml  Output  -  Net 1561.2 ml   Filed Weights   02/10/18 1709 02/11/18 0041  Weight: 70.8 kg 72.9 kg    Exam:   General: NAD  Cardiovascular: S1, S2 present  Respiratory: CTA B  Abdomen: Soft, distended, TTP at lower abdomen, BS present  Musculoskeletal: No pedal edema bilaterally  Skin: Normal  Psychiatry: Normal mood   Data Reviewed: CBC: Recent Labs  Lab 02/10/18 1710 02/12/18 0551 02/14/18 1013  WBC 6.5 4.5 4.0  NEUTROABS 4.6  --  2.7  HGB 13.0 11.6* 11.8*  HCT 39.7 35.1* 35.7*  MCV 89.6 88.2 87.7  PLT 224 174 720   Basic Metabolic Panel: Recent Labs  Lab 02/10/18 1710 02/11/18 0625 02/12/18 0551 02/13/18 0431 02/14/18 1013  NA 138 138 137 136 134*  K 4.1 4.5 4.1 3.9 3.8  CL 100 103 103 101 98  CO2 30 30 28 28 30   GLUCOSE 140* 100* 109* 129* 117*  BUN 11 10 7* 7* 6*  CREATININE 1.16 0.96 1.02 1.01 0.91  CALCIUM 9.8 9.1 8.8* 8.8* 9.0   GFR: Estimated Creatinine Clearance: 72.3 mL/min (by C-G formula based on SCr of 0.91 mg/dL). Liver Function Tests: Recent Labs  Lab 02/10/18 1710  AST 42*  ALT 37  ALKPHOS 71  BILITOT 0.9  PROT 7.5  ALBUMIN 4.1   Recent Labs  Lab 02/10/18 1710  LIPASE 30   No results for input(s): AMMONIA in the last 168 hours. Coagulation Profile: No results for input(s): INR, PROTIME in the last 168 hours. Cardiac Enzymes: No  results for input(s): CKTOTAL, CKMB, CKMBINDEX, TROPONINI in the last 168 hours. BNP (last 3 results) No results for input(s): PROBNP in the last 8760 hours. HbA1C: No results for input(s): HGBA1C in the last 72 hours. CBG: No results for input(s): GLUCAP in the last 168 hours. Lipid Profile: No results for input(s): CHOL, HDL, LDLCALC, TRIG, CHOLHDL, LDLDIRECT in the last 72 hours. Thyroid Function Tests: No results for input(s): TSH, T4TOTAL, FREET4, T3FREE, THYROIDAB in the last 72 hours. Anemia Panel: No results for input(s): VITAMINB12, FOLATE, FERRITIN, TIBC, IRON, RETICCTPCT in the last 72  hours. Urine analysis:    Component Value Date/Time   COLORURINE YELLOW 02/10/2018 2016   APPEARANCEUR CLEAR 02/10/2018 2016   LABSPEC 1.008 02/10/2018 2016   PHURINE 5.0 02/10/2018 2016   GLUCOSEU NEGATIVE 02/10/2018 2016   HGBUR NEGATIVE 02/10/2018 2016   BILIRUBINUR NEGATIVE 02/10/2018 2016   KETONESUR NEGATIVE 02/10/2018 2016   PROTEINUR NEGATIVE 02/10/2018 2016   UROBILINOGEN 0.2 10/04/2014 2223   NITRITE NEGATIVE 02/10/2018 2016   LEUKOCYTESUR NEGATIVE 02/10/2018 2016   Sepsis Labs: @LABRCNTIP (procalcitonin:4,lacticidven:4)  )No results found for this or any previous visit (from the past 240 hour(s)).    Studies: Dg Abd 2 Views  Result Date: 02/14/2018 CLINICAL DATA:  Follow-up small bowel obstruction EXAM: ABDOMEN - 2 VIEW COMPARISON:  02/11/2018 FINDINGS: Persistent small bowel obstruction pattern with dilated fluid and air-filled small intestine. Some air does reach the colon, indicating that this is not complete. No sign of free air. IMPRESSION: Persistent high-grade partial small bowel obstruction pattern. Electronically Signed   By: Nelson Chimes M.D.   On: 02/14/2018 11:53    Scheduled Meds: . enoxaparin (LOVENOX) injection  40 mg Subcutaneous Q24H  . famotidine  20 mg Oral Daily  . morphine  30 mg Oral Q12H    Continuous Infusions: . sodium chloride 50 mL/hr at 02/14/18 1137     LOS: 4 days     Alma Friendly, MD [facility]   If 7PM-7AM, please contact night-coverage [email or web address] Password [facility] 02/14/2018, 2:32 PM

## 2018-02-15 LAB — BASIC METABOLIC PANEL
Anion gap: 3 — ABNORMAL LOW (ref 5–15)
BUN: 7 mg/dL — ABNORMAL LOW (ref 8–23)
CO2: 29 mmol/L (ref 22–32)
CREATININE: 0.87 mg/dL (ref 0.61–1.24)
Calcium: 8.8 mg/dL — ABNORMAL LOW (ref 8.9–10.3)
Chloride: 104 mmol/L (ref 98–111)
GFR calc Af Amer: >60 mL/min (ref >60)
GFR calc non Af Amer: >60 mL/min (ref >60)
GLUCOSE: 88 mg/dL (ref 70–99)
Potassium: 4.1 mmol/L (ref 3.5–5.1)
Sodium: 136 mmol/L (ref 135–145)

## 2018-02-15 LAB — CBC WITH DIFFERENTIAL/PLATELET
Abs Immature Granulocytes: 0 10*3/uL (ref 0.0–0.1)
Basophils Absolute: 0 10*3/uL (ref 0.0–0.1)
Basophils Relative: 1 %
EOS ABS: 0.1 10*3/uL (ref 0.0–0.7)
EOS PCT: 5 %
HEMATOCRIT: 32.4 % — AB (ref 39.0–52.0)
Hemoglobin: 10.7 g/dL — ABNORMAL LOW (ref 13.0–17.0)
Immature Granulocytes: 0 %
LYMPHS ABS: 1.1 10*3/uL (ref 0.7–4.0)
Lymphocytes Relative: 39 %
MCH: 28.9 pg (ref 26.0–34.0)
MCHC: 33 g/dL (ref 30.0–36.0)
MCV: 87.6 fL (ref 78.0–100.0)
MONOS PCT: 10 %
Monocytes Absolute: 0.3 10*3/uL (ref 0.1–1.0)
Neutro Abs: 1.3 10*3/uL — ABNORMAL LOW (ref 1.7–7.7)
Neutrophils Relative %: 45 %
Platelets: 160 10*3/uL (ref 150–400)
RBC: 3.7 MIL/uL — ABNORMAL LOW (ref 4.22–5.81)
RDW: 12.6 % (ref 11.5–15.5)
WBC: 2.9 10*3/uL — AB (ref 4.0–10.5)

## 2018-02-15 MED ORDER — DIATRIZOATE MEGLUMINE & SODIUM 66-10 % PO SOLN
50.0000 mL | Freq: Once | ORAL | Status: DC
  Filled 2018-02-15: qty 60

## 2018-02-15 MED ORDER — DIATRIZOATE MEGLUMINE & SODIUM 66-10 % PO SOLN
90.0000 mL | Freq: Once | ORAL | Status: AC
  Administered 2018-02-15: 90 mL via ORAL
  Filled 2018-02-15: qty 90

## 2018-02-15 NOTE — Progress Notes (Signed)
[facility] Progress Note     Subjective: CC: abdominal pain and distention Patient complaining of worsened distention and burning pain in lower abdomen. Passing very small amounts diarrhea and flatus with this. Denies nausea. Eating small amounts of fulls. Patient concerned that even if this resolves conservatively this time, that he will just end up back here in a few weeks.   Objective: Vital signs in last 24 hours: Temp:  [97.5 F (36.4 C)-98.3 F (36.8 C)] 98.3 F (36.8 C) (08/26 0439) Pulse Rate:  [67-74] 70 (08/26 0439) Resp:  [16-18] 16 (08/26 0439) BP: (132-133)/(63-80) 133/63 (08/26 0439) SpO2:  [97 %-100 %] 97 % (08/26 0439) Last BM Date: 02/15/18  Intake/Output from previous day: 08/25 0701 - 08/26 0700 In: 1824.9 [P.O.:844; I.V.:980.9] Out: -  Intake/Output this shift: Total I/O In: 720 [P.O.:120; I.V.:600] Out: -   PE: Gen:  Alert, NAD, pleasant Card:  Regular rate and rhythm, pedal pulses 2+ BL Pulm:  Normal effort, clear to auscultation bilaterally Abd: Soft, mildly TTP in lower abdomen, distended, bowel sounds present in all 4 quadrants, no HSM, midline surgical scar present Skin: warm and dry, no rashes  Psych: A&Ox3   Lab Results:  Recent Labs    02/14/18 1013 02/15/18 0444  WBC 4.0 2.9*  HGB 11.8* 10.7*  HCT 35.7* 32.4*  PLT 169 160   BMET Recent Labs    02/14/18 1013 02/15/18 0444  NA 134* 136  K 3.8 4.1  CL 98 104  CO2 30 29  GLUCOSE 117* 88  BUN 6* 7*  CREATININE 0.91 0.87  CALCIUM 9.0 8.8*   PT/INR No results for input(s): LABPROT, INR in the last 72 hours. CMP     Component Value Date/Time   NA 136 02/15/2018 0444   K 4.1 02/15/2018 0444   CL 104 02/15/2018 0444   CO2 29 02/15/2018 0444   GLUCOSE 88 02/15/2018 0444   BUN 7 (L) 02/15/2018 0444   CREATININE 0.87 02/15/2018 0444   CALCIUM 8.8 (L) 02/15/2018 0444   PROT 7.5 02/10/2018 1710   ALBUMIN 4.1 02/10/2018 1710   AST 42 (H) 02/10/2018 1710   ALT 37  02/10/2018 1710   ALKPHOS 71 02/10/2018 1710   BILITOT 0.9 02/10/2018 1710   GFRNONAA >60 02/15/2018 0444   GFRAA >60 02/15/2018 0444   Lipase     Component Value Date/Time   LIPASE 30 02/10/2018 1710       Studies/Results: Dg Abd 2 Views  Result Date: 02/14/2018 CLINICAL DATA:  Follow-up small bowel obstruction EXAM: ABDOMEN - 2 VIEW COMPARISON:  02/11/2018 FINDINGS: Persistent small bowel obstruction pattern with dilated fluid and air-filled small intestine. Some air does reach the colon, indicating that this is not complete. No sign of free air. IMPRESSION: Persistent high-grade partial small bowel obstruction pattern. Electronically Signed   By: Nelson Chimes M.D.   On: 02/14/2018 11:53    Anti-infectives: Anti-infectives (From admission, onward)   None       Assessment/Plan GERD Chronic back pain  SBO - patient passing some small amount stool - imaging not very helpful as pt is s/p subtotal colectomy and has chronically dilated small bowel - try PO gastrografin today and see if that helps open things up - mobilize as able - hopefully this will resolve with conservative management   FEN: FLD, IVF VTE: SCDs, lovenox ID: no abx indicated  LOS: 5 days    Brigid Re , [facility] Surgery 02/15/2018, 9:32 AM Pager:  [phone] Consults: [phone] Mon-Fri 7:00 am-4:30 pm Sat-Sun 7:00 am-11:30 am

## 2018-02-15 NOTE — Progress Notes (Signed)
PROGRESS NOTE  Kent [name] [id number]:[id number] DOB: December 22, 1940 DOA: 02/10/2018 PCP: Shirline Frees, MD  HPI/Recap of past 24 hours: Kent [name] is a 77 y.o. 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 Last admit for SBO was in July 2018. Patient presents to ED c/o 1 day h/o worsening abd distention, N/V, abd pain which is diffuse. In the ED, CT confirms SBO, suspicious for volvulus or internal hernia. General surgery consulted. Pt admitted for further management.  Today, patient reported having more abdominal pain, distension. Passing small loose stools, denies any N/V. Able to tolerate some FLD.  Assessment/Plan: Principal Problem:   SBO (small bowel obstruction) (HCC) Active Problems:   H/O abdominal surgery  High grade partial SBO Ongoing History of multiple abdominal surgeries in the past Despite multiple attempts, NG tube could not be placed CT abdomen showed: Status post subtotal colectomy with findings consistent with distal small-bowel obstruction  Repeat Abd xray showed persistent high grade SBO General surgery on board: Plan for gastrografin today Monitor closely  Chronic pain syndrome Continue MS Contin    Code Status: Full  Family Communication: None at bedside  Disposition Plan: Home once significant clinical improvement   Consultants: General surgery  Procedures:  None  Antimicrobials:  None  DVT prophylaxis: Lovenox   Objective: Vitals:   02/14/18 0526 02/14/18 1603 02/14/18 2127 02/15/18 0439  BP: (!) 167/92 132/69 132/80 133/63  Pulse: 85 74 67 70  Resp: 16 18 16 16   Temp: 98.2 F (36.8 C) (!) 97.5 F (36.4 C) 98.3 F (36.8 C) 98.3 F (36.8 C)  TempSrc: Oral Oral Oral Oral  SpO2: 97% 97% 100% 97%  Weight:      Height:        Intake/Output Summary (Last 24 hours) at 02/15/2018 1249 Last data filed at 02/15/2018 0900 Gross per  24 hour  Intake 1694.85 ml  Output -  Net 1694.85 ml   Filed Weights   02/10/18 1709 02/11/18 0041  Weight: 70.8 kg 72.9 kg    Exam:   General: NAD  Cardiovascular: S1, S2 present  Respiratory: CTA B  Abdomen: Soft, distended, TTP at lower abdomen, BS present  Musculoskeletal: No pedal edema bilaterally  Skin: Normal  Psychiatry: Normal mood   Data Reviewed: CBC: Recent Labs  Lab 02/10/18 1710 02/12/18 0551 02/14/18 1013 02/15/18 0444  WBC 6.5 4.5 4.0 2.9*  NEUTROABS 4.6  --  2.7 1.3*  HGB 13.0 11.6* 11.8* 10.7*  HCT 39.7 35.1* 35.7* 32.4*  MCV 89.6 88.2 87.7 87.6  PLT 224 174 169 850   Basic Metabolic Panel: Recent Labs  Lab 02/11/18 0625 02/12/18 0551 02/13/18 0431 02/14/18 1013 02/15/18 0444  NA 138 137 136 134* 136  K 4.5 4.1 3.9 3.8 4.1  CL 103 103 101 98 104  CO2 30 28 28 30 29   GLUCOSE 100* 109* 129* 117* 88  BUN 10 7* 7* 6* 7*  CREATININE 0.96 1.02 1.01 0.91 0.87  CALCIUM 9.1 8.8* 8.8* 9.0 8.8*   GFR: Estimated Creatinine Clearance: 75.6 mL/min (by C-G formula based on SCr of 0.87 mg/dL). Liver Function Tests: Recent Labs  Lab 02/10/18 1710  AST 42*  ALT 37  ALKPHOS 71  BILITOT 0.9  PROT 7.5  ALBUMIN 4.1   Recent Labs  Lab 02/10/18 1710  LIPASE 30   No results for input(s): AMMONIA in the last 168 hours. Coagulation Profile: No results  for input(s): INR, PROTIME in the last 168 hours. Cardiac Enzymes: No results for input(s): CKTOTAL, CKMB, CKMBINDEX, TROPONINI in the last 168 hours. BNP (last 3 results) No results for input(s): PROBNP in the last 8760 hours. HbA1C: No results for input(s): HGBA1C in the last 72 hours. CBG: No results for input(s): GLUCAP in the last 168 hours. Lipid Profile: No results for input(s): CHOL, HDL, LDLCALC, TRIG, CHOLHDL, LDLDIRECT in the last 72 hours. Thyroid Function Tests: No results for input(s): TSH, T4TOTAL, FREET4, T3FREE, THYROIDAB in the last 72 hours. Anemia Panel: No results  for input(s): VITAMINB12, FOLATE, FERRITIN, TIBC, IRON, RETICCTPCT in the last 72 hours. Urine analysis:    Component Value Date/Time   COLORURINE YELLOW 02/10/2018 2016   APPEARANCEUR CLEAR 02/10/2018 2016   LABSPEC 1.008 02/10/2018 2016   PHURINE 5.0 02/10/2018 2016   GLUCOSEU NEGATIVE 02/10/2018 2016   HGBUR NEGATIVE 02/10/2018 2016   BILIRUBINUR NEGATIVE 02/10/2018 2016   KETONESUR NEGATIVE 02/10/2018 2016   PROTEINUR NEGATIVE 02/10/2018 2016   UROBILINOGEN 0.2 10/04/2014 2223   NITRITE NEGATIVE 02/10/2018 2016   LEUKOCYTESUR NEGATIVE 02/10/2018 2016   Sepsis Labs: @LABRCNTIP (procalcitonin:4,lacticidven:4)  )No results found for this or any previous visit (from the past 240 hour(s)).    Studies: No results found.  Scheduled Meds: . enoxaparin (LOVENOX) injection  40 mg Subcutaneous Q24H  . famotidine  20 mg Oral Daily  . morphine  30 mg Oral Q12H    Continuous Infusions: . sodium chloride 1,000 mL (02/15/18 0701)     LOS: 5 days     Alma Friendly, MD [facility]   If 7PM-7AM, please contact night-coverage [email or web address] Password [facility] 02/15/2018, 12:49 PM

## 2018-02-16 LAB — CBC WITH DIFFERENTIAL/PLATELET
ABS IMMATURE GRANULOCYTES: 0 10*3/uL (ref 0.0–0.1)
BASOS PCT: 1 %
Basophils Absolute: 0 10*3/uL (ref 0.0–0.1)
EOS ABS: 0.1 10*3/uL (ref 0.0–0.7)
Eosinophils Relative: 3 %
HCT: 32.6 % — ABNORMAL LOW (ref 39.0–52.0)
Hemoglobin: 10.9 g/dL — ABNORMAL LOW (ref 13.0–17.0)
IMMATURE GRANULOCYTES: 0 %
Lymphocytes Relative: 24 %
Lymphs Abs: 1.1 10*3/uL (ref 0.7–4.0)
MCH: 29.1 pg (ref 26.0–34.0)
MCHC: 33.4 g/dL (ref 30.0–36.0)
MCV: 86.9 fL (ref 78.0–100.0)
Monocytes Absolute: 0.4 10*3/uL (ref 0.1–1.0)
Monocytes Relative: 8 %
NEUTROS ABS: 2.9 10*3/uL (ref 1.7–7.7)
NEUTROS PCT: 64 %
PLATELETS: 177 10*3/uL (ref 150–400)
RBC: 3.75 MIL/uL — AB (ref 4.22–5.81)
RDW: 12.4 % (ref 11.5–15.5)
WBC: 4.6 10*3/uL (ref 4.0–10.5)

## 2018-02-16 LAB — BASIC METABOLIC PANEL
ANION GAP: 10 (ref 5–15)
BUN: 8 mg/dL (ref 8–23)
CO2: 27 mmol/L (ref 22–32)
Calcium: 9.7 mg/dL (ref 8.9–10.3)
Chloride: 100 mmol/L (ref 98–111)
Creatinine, Ser: 0.89 mg/dL (ref 0.61–1.24)
Glucose, Bld: 94 mg/dL (ref 70–99)
POTASSIUM: 3.8 mmol/L (ref 3.5–5.1)
Sodium: 137 mmol/L (ref 135–145)

## 2018-02-16 MED ORDER — BISACODYL 10 MG RE SUPP: 10.0000 mg | Freq: Every day | RECTAL | Status: DC | PRN

## 2018-02-16 MED ORDER — LORATADINE 10 MG PO TABS
10.0000 mg | ORAL_TABLET | Freq: Every day | ORAL | Status: DC
  Administered 2018-02-16: 10 mg via ORAL
  Filled 2018-02-16 (×2): qty 1

## 2018-02-16 MED ORDER — DOCUSATE SODIUM 100 MG PO CAPS
100.0000 mg | ORAL_CAPSULE | Freq: Two times a day (BID) | ORAL | Status: DC
  Administered 2018-02-16 – 2018-02-18 (×5): 100 mg via ORAL
  Filled 2018-02-16 (×5): qty 1

## 2018-02-16 MED ORDER — POLYETHYLENE GLYCOL 3350 17 G PO PACK
17.0000 g | PACK | Freq: Every day | ORAL | Status: DC
  Administered 2018-02-16 – 2018-02-18 (×3): 17 g via ORAL
  Filled 2018-02-16 (×3): qty 1

## 2018-02-16 NOTE — Progress Notes (Signed)
[facility] Progress Note     Subjective: CC: abdominal pain Improved somewhat from yesterday. Patient reports mild burning feeling right below umbilicus. Had several BMs yesterday. Tolerating soft diet so far this AM. Pt reports taking miralax daily at home most of the time.   Objective: Vital signs in last 24 hours: Temp:  [97.6 F (36.4 C)-98.1 F (36.7 C)] 98.1 F (36.7 C) (08/27 0609) Pulse Rate:  [65-75] 75 (08/27 0609) Resp:  [17-18] 18 (08/27 0609) BP: (113-147)/(68-101) 139/75 (08/27 0609) SpO2:  [97 %-100 %] 97 % (08/27 0609) Last BM Date: 02/15/18  Intake/Output from previous day: 08/26 0701 - 08/27 0700 In: 2660 [P.O.:1160; I.V.:1500] Out: -  Intake/Output this shift: No intake/output data recorded.  PE: Gen:  Alert, NAD, pleasant Pulm:  Normal effort Abd: Soft, less TTP in lower abdomen, less distended, bowel sounds present in all 4 quadrants, no HSM, midline surgical scar present Skin: warm and dry, no rashes  Psych: A&Ox3   Lab Results:  Recent Labs    02/15/18 0444 02/16/18 0659  WBC 2.9* 4.6  HGB 10.7* 10.9*  HCT 32.4* 32.6*  PLT 160 177   BMET Recent Labs    02/15/18 0444 02/16/18 0659  NA 136 137  K 4.1 3.8  CL 104 100  CO2 29 27  GLUCOSE 88 94  BUN 7* 8  CREATININE 0.87 0.89  CALCIUM 8.8* 9.7   PT/INR No results for input(s): LABPROT, INR in the last 72 hours. CMP     Component Value Date/Time   NA 137 02/16/2018 0659   K 3.8 02/16/2018 0659   CL 100 02/16/2018 0659   CO2 27 02/16/2018 0659   GLUCOSE 94 02/16/2018 0659   BUN 8 02/16/2018 0659   CREATININE 0.89 02/16/2018 0659   CALCIUM 9.7 02/16/2018 0659   PROT 7.5 02/10/2018 1710   ALBUMIN 4.1 02/10/2018 1710   AST 42 (H) 02/10/2018 1710   ALT 37 02/10/2018 1710   ALKPHOS 71 02/10/2018 1710   BILITOT 0.9 02/10/2018 1710   GFRNONAA >60 02/16/2018 0659   GFRAA >60 02/16/2018 0659   Lipase     Component Value Date/Time   LIPASE 30 02/10/2018 1710        Studies/Results: Dg Abd 2 Views  Result Date: 02/14/2018 CLINICAL DATA:  Follow-up small bowel obstruction EXAM: ABDOMEN - 2 VIEW COMPARISON:  02/11/2018 FINDINGS: Persistent small bowel obstruction pattern with dilated fluid and air-filled small intestine. Some air does reach the colon, indicating that this is not complete. No sign of free air. IMPRESSION: Persistent high-grade partial small bowel obstruction pattern. Electronically Signed   By: Nelson Chimes M.D.   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

## 2018-02-16 NOTE — Progress Notes (Signed)
PROGRESS NOTE  Kent [name] JIR:[id number] DOB: Apr 21, 1941 DOA: 02/10/2018 PCP: Shirline Frees, MD  HPI/Recap of past 24 hours: Kent [name] is a 77 y.o. 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 Last admit for SBO was in July 2018. Patient presents to ED c/o 1 day h/o worsening abd distention, N/V, abd pain which is diffuse. In the ED, CT confirms SBO, suspicious for volvulus or internal hernia. General surgery consulted. Pt admitted for further management.  Today, patient reported feeling better, less abdominal pain/distension, had multiple BMs yesterday after having PO gastrografin. Diet advanced to soft  Assessment/Plan: Principal Problem:   SBO (small bowel obstruction) (HCC) Active Problems:   H/O abdominal surgery  High grade partial SBO Improving History of multiple abdominal surgeries in the past Despite multiple attempts, NG tube could not be placed CT abdomen showed: Status post subtotal colectomy with findings consistent with distal small-bowel obstruction  Repeat Abd xray showed persistent high grade SBO but not helpful as per gen surg due to chronically dilated small bowel General surgery on board: Plan for d/c tomorrow if tolerates soft diet today Monitor closely  Chronic pain syndrome Continue MS Contin    Code Status: Full  Family Communication: None at bedside  Disposition Plan: Home once significant clinical improvement, likely 02/17/18   Consultants: General surgery  Procedures:  None  Antimicrobials:  None  DVT prophylaxis: Lovenox   Objective: Vitals:   02/15/18 1429 02/15/18 2202 02/16/18 0609 02/16/18 1331  BP: 113/68 (!) 147/101 139/75 117/67  Pulse: 65 69 75 69  Resp: 18 17 18 18   Temp: 97.9 F (36.6 C) 97.6 F (36.4 C) 98.1 F (36.7 C) 98.2 F (36.8 C)  TempSrc: Oral Oral Oral Oral  SpO2: 97% 100% 97% 99%  Weight:       Height:        Intake/Output Summary (Last 24 hours) at 02/16/2018 1608 Last data filed at 02/16/2018 1416 Gross per 24 hour  Intake 1470 ml  Output -  Net 1470 ml   Filed Weights   02/10/18 1709 02/11/18 0041  Weight: 70.8 kg 72.9 kg    Exam:   General: NAD  Cardiovascular: S1, S2 present  Respiratory: CTA B  Abdomen: Soft, less distended, TTP around umbilicus, BS present  Musculoskeletal: No pedal edema bilaterally  Skin: Normal  Psychiatry: Normal mood   Data Reviewed: CBC: Recent Labs  Lab 02/10/18 1710 02/12/18 0551 02/14/18 1013 02/15/18 0444 02/16/18 0659  WBC 6.5 4.5 4.0 2.9* 4.6  NEUTROABS 4.6  --  2.7 1.3* 2.9  HGB 13.0 11.6* 11.8* 10.7* 10.9*  HCT 39.7 35.1* 35.7* 32.4* 32.6*  MCV 89.6 88.2 87.7 87.6 86.9  PLT 224 174 169 160 751   Basic Metabolic Panel: Recent Labs  Lab 02/12/18 0551 02/13/18 0431 02/14/18 1013 02/15/18 0444 02/16/18 0659  NA 137 136 134* 136 137  K 4.1 3.9 3.8 4.1 3.8  CL 103 101 98 104 100  CO2 28 28 30 29 27   GLUCOSE 109* 129* 117* 88 94  BUN 7* 7* 6* 7* 8  CREATININE 1.02 1.01 0.91 0.87 0.89  CALCIUM 8.8* 8.8* 9.0 8.8* 9.7   GFR: Estimated Creatinine Clearance: 73.9 mL/min (by C-G formula based on SCr of 0.89 mg/dL). Liver Function Tests: Recent Labs  Lab 02/10/18 1710  AST 42*  ALT 37  ALKPHOS 71  BILITOT 0.9  PROT 7.5  ALBUMIN 4.1  Recent Labs  Lab 02/10/18 1710  LIPASE 30   No results for input(s): AMMONIA in the last 168 hours. Coagulation Profile: No results for input(s): INR, PROTIME in the last 168 hours. Cardiac Enzymes: No results for input(s): CKTOTAL, CKMB, CKMBINDEX, TROPONINI in the last 168 hours. BNP (last 3 results) No results for input(s): PROBNP in the last 8760 hours. HbA1C: No results for input(s): HGBA1C in the last 72 hours. CBG: No results for input(s): GLUCAP in the last 168 hours. Lipid Profile: No results for input(s): CHOL, HDL, LDLCALC, TRIG, CHOLHDL,  LDLDIRECT in the last 72 hours. Thyroid Function Tests: No results for input(s): TSH, T4TOTAL, FREET4, T3FREE, THYROIDAB in the last 72 hours. Anemia Panel: No results for input(s): VITAMINB12, FOLATE, FERRITIN, TIBC, IRON, RETICCTPCT in the last 72 hours. Urine analysis:    Component Value Date/Time   COLORURINE YELLOW 02/10/2018 2016   APPEARANCEUR CLEAR 02/10/2018 2016   LABSPEC 1.008 02/10/2018 2016   PHURINE 5.0 02/10/2018 2016   GLUCOSEU NEGATIVE 02/10/2018 2016   HGBUR NEGATIVE 02/10/2018 2016   BILIRUBINUR NEGATIVE 02/10/2018 2016   KETONESUR NEGATIVE 02/10/2018 2016   PROTEINUR NEGATIVE 02/10/2018 2016   UROBILINOGEN 0.2 10/04/2014 2223   NITRITE NEGATIVE 02/10/2018 2016   LEUKOCYTESUR NEGATIVE 02/10/2018 2016   Sepsis Labs: @LABRCNTIP (procalcitonin:4,lacticidven:4)  )No results found for this or any previous visit (from the past 240 hour(s)).    Studies: No results found.  Scheduled Meds: . docusate sodium  100 mg Oral BID  . enoxaparin (LOVENOX) injection  40 mg Subcutaneous Q24H  . famotidine  20 mg Oral Daily  . morphine  30 mg Oral Q12H  . polyethylene glycol  17 g Oral Daily    Continuous Infusions:    LOS: 6 days     Alma Friendly, MD [facility]   If 7PM-7AM, please contact night-coverage [email or web address] Password [facility] 02/16/2018, 4:08 PM

## 2018-02-16 NOTE — Plan of Care (Signed)

## 2018-02-17 DIAGNOSIS — K56609 Unspecified intestinal obstruction, unspecified as to partial versus complete obstruction: Secondary | ICD-10-CM

## 2018-02-17 LAB — BASIC METABOLIC PANEL
Anion gap: 6 (ref 5–15)
BUN: 11 mg/dL (ref 8–23)
CALCIUM: 9.3 mg/dL (ref 8.9–10.3)
CO2: 28 mmol/L (ref 22–32)
CREATININE: 0.85 mg/dL (ref 0.61–1.24)
Chloride: 104 mmol/L (ref 98–111)
GFR calc Af Amer: >60 mL/min (ref >60)
GFR calc non Af Amer: >60 mL/min (ref >60)
Glucose, Bld: 95 mg/dL (ref 70–99)
Potassium: 3.8 mmol/L (ref 3.5–5.1)
SODIUM: 138 mmol/L (ref 135–145)

## 2018-02-17 LAB — CBC WITH DIFFERENTIAL/PLATELET
Abs Immature Granulocytes: 0 10*3/uL (ref 0.0–0.1)
BASOS PCT: 1 %
Basophils Absolute: 0 10*3/uL (ref 0.0–0.1)
EOS ABS: 0.1 10*3/uL (ref 0.0–0.7)
EOS PCT: 3 %
HCT: 32.4 % — ABNORMAL LOW (ref 39.0–52.0)
Hemoglobin: 10.8 g/dL — ABNORMAL LOW (ref 13.0–17.0)
Immature Granulocytes: 0 %
Lymphocytes Relative: 33 %
Lymphs Abs: 1.5 10*3/uL (ref 0.7–4.0)
MCH: 29 pg (ref 26.0–34.0)
MCHC: 33.3 g/dL (ref 30.0–36.0)
MCV: 87.1 fL (ref 78.0–100.0)
MONO ABS: 0.4 10*3/uL (ref 0.1–1.0)
Monocytes Relative: 9 %
Neutro Abs: 2.5 10*3/uL (ref 1.7–7.7)
Neutrophils Relative %: 54 %
PLATELETS: 194 10*3/uL (ref 150–400)
RBC: 3.72 MIL/uL — ABNORMAL LOW (ref 4.22–5.81)
RDW: 12.5 % (ref 11.5–15.5)
WBC: 4.5 10*3/uL (ref 4.0–10.5)

## 2018-02-17 MED ORDER — MAGNESIUM HYDROXIDE 400 MG/5ML PO SUSP
15.0000 mL | Freq: Every day | ORAL | Status: DC
  Administered 2018-02-17 – 2018-02-18 (×2): 15 mL via ORAL
  Filled 2018-02-17 (×2): qty 30

## 2018-02-17 NOTE — Progress Notes (Signed)
PROGRESS NOTE  Kent [name] MEQ:[id number] DOB: [birth year] DOA: 02/10/2018 PCP: Shirline Frees, MD  HPI/Recap of past 24 hours: Kent [name] is a 77 y.o. 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 Last admit for SBO was in July 2018. Patient presents to ED c/o 1 day h/o worsening abd distention, N/V, abd pain which is diffuse. In the ED, CT confirms SBO, suspicious for volvulus or internal hernia. General surgery consulted. Pt admitted for further management.  Subjective No BM today, feels need to have BM.  Diet advanced to soft.    Assessment/Plan: Principal Problem:   SBO (small bowel obstruction) (HCC) Active Problems:   H/O abdominal surgery  High grade partial SBO History of multiple abdominal surgeries in the past Despite multiple attempts, NG tube could not be placed CT abdomen showed: Status post subtotal colectomy with findings consistent with distal small-bowel obstruction  Repeat Abd xray showed persistent high grade SBO but not helpful as per gen surg due to chronically dilated small bowel Advance diet, no BM today.  Plan to get milk of magnesia.    Chronic pain syndrome Continue MS Contin    Code Status: Full  Family Communication: None at bedside  Disposition Plan: Home once significant clinical improvement, likely 8-29   Consultants: General surgery  Procedures:  None  Antimicrobials:  None  DVT prophylaxis: Lovenox   Objective: Vitals:   02/16/18 1331 02/16/18 2110 02/17/18 0424 02/17/18 1558  BP: 117/67 123/62 (!) 142/69 (!) 118/57  Pulse: 69 67 74 72  Resp: 18 16 16 18   Temp: 98.2 F (36.8 C) 98 F (36.7 C) 98.4 F (36.9 C) 97.6 F (36.4 C)  TempSrc: Oral Oral Oral Oral  SpO2: 99% 99% 95% 100%  Weight:      Height:        Intake/Output Summary (Last 24 hours) at 02/17/2018 1807 Last data filed at 02/17/2018 1500 Gross  per 24 hour  Intake 1140 ml  Output -  Net 1140 ml   Filed Weights   02/10/18 1709 02/11/18 0041  Weight: 70.8 kg 72.9 kg    Exam:   General: NAD  Cardiovascular: S 1, S 2 RRR  Respiratory: CTA  Abdomen: BS presents, soft, nt  Musculoskeletal: no edema  Skin: norma;   Data Reviewed: CBC: Recent Labs  Lab 02/12/18 0551 02/14/18 1013 02/15/18 0444 02/16/18 0659 02/17/18 0444  WBC 4.5 4.0 2.9* 4.6 4.5  NEUTROABS  --  2.7 1.3* 2.9 2.5  HGB 11.6* 11.8* 10.7* 10.9* 10.8*  HCT 35.1* 35.7* 32.4* 32.6* 32.4*  MCV 88.2 87.7 87.6 86.9 87.1  PLT 174 169 160 177 297   Basic Metabolic Panel: Recent Labs  Lab 02/13/18 0431 02/14/18 1013 02/15/18 0444 02/16/18 0659 02/17/18 0444  NA 136 134* 136 137 138  K 3.9 3.8 4.1 3.8 3.8  CL 101 98 104 100 104  CO2 28 30 29 27 28   GLUCOSE 129* 117* 88 94 95  BUN 7* 6* 7* 8 11  CREATININE 1.01 0.91 0.87 0.89 0.85  CALCIUM 8.8* 9.0 8.8* 9.7 9.3   GFR: Estimated Creatinine Clearance: 77.4 mL/min (by C-G formula based on SCr of 0.85 mg/dL). Liver Function Tests: No results for input(s): AST, ALT, ALKPHOS, BILITOT, PROT, ALBUMIN in the last 168 hours. No results for input(s): LIPASE, AMYLASE in the last 168 hours. No results for input(s): AMMONIA in the last 168 hours. Coagulation Profile:  No results for input(s): INR, PROTIME in the last 168 hours. Cardiac Enzymes: No results for input(s): CKTOTAL, CKMB, CKMBINDEX, TROPONINI in the last 168 hours. BNP (last 3 results) No results for input(s): PROBNP in the last 8760 hours. HbA1C: No results for input(s): HGBA1C in the last 72 hours. CBG: No results for input(s): GLUCAP in the last 168 hours. Lipid Profile: No results for input(s): CHOL, HDL, LDLCALC, TRIG, CHOLHDL, LDLDIRECT in the last 72 hours. Thyroid Function Tests: No results for input(s): TSH, T4TOTAL, FREET4, T3FREE, THYROIDAB in the last 72 hours. Anemia Panel: No results for input(s): VITAMINB12, FOLATE,  FERRITIN, TIBC, IRON, RETICCTPCT in the last 72 hours. Urine analysis:    Component Value Date/Time   COLORURINE YELLOW 02/10/2018 2016   APPEARANCEUR CLEAR 02/10/2018 2016   LABSPEC 1.008 02/10/2018 2016   PHURINE 5.0 02/10/2018 2016   GLUCOSEU NEGATIVE 02/10/2018 2016   HGBUR NEGATIVE 02/10/2018 2016   BILIRUBINUR NEGATIVE 02/10/2018 2016   KETONESUR NEGATIVE 02/10/2018 2016   PROTEINUR NEGATIVE 02/10/2018 2016   UROBILINOGEN 0.2 10/04/2014 2223   NITRITE NEGATIVE 02/10/2018 2016   LEUKOCYTESUR NEGATIVE 02/10/2018 2016   Sepsis Labs: @LABRCNTIP (procalcitonin:4,lacticidven:4)  )No results found for this or any previous visit (from the past 240 hour(s)).    Studies: No results found.  Scheduled Meds: . docusate sodium  100 mg Oral BID  . enoxaparin (LOVENOX) injection  40 mg Subcutaneous Q24H  . famotidine  20 mg Oral Daily  . loratadine  10 mg Oral QHS  . magnesium hydroxide  15 mL Oral Daily  . morphine  30 mg Oral Q12H  . polyethylene glycol  17 g Oral Daily    Continuous Infusions:    LOS: 7 days     Elmarie Shiley, MD [facility]   If 7PM-7AM, please contact night-coverage [email or web address] Password [facility] 02/17/2018, 6:07 PM

## 2018-02-17 NOTE — Progress Notes (Signed)
[facility] Progress Note     Subjective: CC: abdominal pain Pt states he has the same soreness at umbilicus. Passed flatus overnight. Has been ambulating. One other BM yesterday AM after breakfast. Denies nausea, tolerating diet.   Objective: Vital signs in last 24 hours: Temp:  [98 F (36.7 C)-98.4 F (36.9 C)] 98.4 F (36.9 C) (08/28 0424) Pulse Rate:  [67-74] 74 (08/28 0424) Resp:  [16-18] 16 (08/28 0424) BP: (117-142)/(62-69) 142/69 (08/28 0424) SpO2:  [95 %-99 %] 95 % (08/28 0424) Last BM Date: 02/15/18  Intake/Output from previous day: 08/27 0701 - 08/28 0700 In: 900 [P.O.:900] Out: -  Intake/Output this shift: No intake/output data recorded.  PE: Gen: Alert, NAD, pleasant Pulm: Normal effort Abd: Soft,less TTP in lower abdomen, less distended, bowel sounds present in all 4 quadrants, no HSM,midline surgical scar present Skin: warm and dry, no rashes  Psych: A&Ox3   Lab Results:  Recent Labs    02/16/18 0659 02/17/18 0444  WBC 4.6 4.5  HGB 10.9* 10.8*  HCT 32.6* 32.4*  PLT 177 194   BMET Recent Labs    02/16/18 0659 02/17/18 0444  NA 137 138  K 3.8 3.8  CL 100 104  CO2 27 28  GLUCOSE 94 95  BUN 8 11  CREATININE 0.89 0.85  CALCIUM 9.7 9.3   PT/INR No results for input(s): LABPROT, INR in the last 72 hours. CMP     Component Value Date/Time   NA 138 02/17/2018 0444   K 3.8 02/17/2018 0444   CL 104 02/17/2018 0444   CO2 28 02/17/2018 0444   GLUCOSE 95 02/17/2018 0444   BUN 11 02/17/2018 0444   CREATININE 0.85 02/17/2018 0444   CALCIUM 9.3 02/17/2018 0444   PROT 7.5 02/10/2018 1710   ALBUMIN 4.1 02/10/2018 1710   AST 42 (H) 02/10/2018 1710   ALT 37 02/10/2018 1710   ALKPHOS 71 02/10/2018 1710   BILITOT 0.9 02/10/2018 1710   GFRNONAA >60 02/17/2018 0444   GFRAA >60 02/17/2018 0444   Lipase     Component Value Date/Time   LIPASE 30 02/10/2018 1710       Studies/Results: No results  found.  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

## 2018-02-18 LAB — BASIC METABOLIC PANEL
Anion gap: 7 (ref 5–15)
BUN: 18 mg/dL (ref 8–23)
CALCIUM: 8.9 mg/dL (ref 8.9–10.3)
CHLORIDE: 103 mmol/L (ref 98–111)
CO2: 27 mmol/L (ref 22–32)
CREATININE: 0.84 mg/dL (ref 0.61–1.24)
Glucose, Bld: 97 mg/dL (ref 70–99)
Potassium: 4 mmol/L (ref 3.5–5.1)
SODIUM: 137 mmol/L (ref 135–145)

## 2018-02-18 LAB — CBC WITH DIFFERENTIAL/PLATELET
Abs Immature Granulocytes: 0 10*3/uL (ref 0.0–0.1)
BASOS ABS: 0 10*3/uL (ref 0.0–0.1)
BASOS PCT: 1 %
EOS PCT: 4 %
Eosinophils Absolute: 0.2 10*3/uL (ref 0.0–0.7)
HCT: 32.2 % — ABNORMAL LOW (ref 39.0–52.0)
HEMOGLOBIN: 10.7 g/dL — AB (ref 13.0–17.0)
Immature Granulocytes: 0 %
LYMPHS PCT: 39 %
Lymphs Abs: 1.5 10*3/uL (ref 0.7–4.0)
MCH: 29.3 pg (ref 26.0–34.0)
MCHC: 33.2 g/dL (ref 30.0–36.0)
MCV: 88.2 fL (ref 78.0–100.0)
Monocytes Absolute: 0.4 10*3/uL (ref 0.1–1.0)
Monocytes Relative: 11 %
NEUTROS ABS: 1.8 10*3/uL (ref 1.7–7.7)
Neutrophils Relative %: 45 %
PLATELETS: 181 10*3/uL (ref 150–400)
RBC: 3.65 MIL/uL — AB (ref 4.22–5.81)
RDW: 12.5 % (ref 11.5–15.5)
WBC: 3.9 10*3/uL — AB (ref 4.0–10.5)

## 2018-02-18 MED ORDER — MAGNESIUM HYDROXIDE 400 MG/5ML PO SUSP: 15.0000 mL | Freq: Every day | ORAL | 0 refills | Status: DC

## 2018-02-18 MED ORDER — POLYETHYLENE GLYCOL 3350 17 G PO PACK: 17.0000 g | PACK | Freq: Every day | ORAL | 0 refills | Status: DC

## 2018-02-18 MED ORDER — DOCUSATE SODIUM 100 MG PO CAPS: 100.0000 mg | ORAL_CAPSULE | Freq: Two times a day (BID) | ORAL | 0 refills | Status: DC

## 2018-02-18 NOTE — Progress Notes (Signed)
[facility] Progress Note     Subjective: CC: no complaints Pt feels well this AM. Had another BM yesterday. Tolerating diet. Discussed home bowel regimen.   Objective: Vital signs in last 24 hours: Temp:  [97.6 F (36.4 C)-98.2 F (36.8 C)] 97.9 F (36.6 C) (08/29 0433) Pulse Rate:  [60-72] 60 (08/29 0433) Resp:  [16-18] 16 (08/29 0433) BP: (118-133)/(57-67) 125/67 (08/29 0433) SpO2:  [99 %-100 %] 99 % (08/29 0433) Last BM Date: 02/16/18  Intake/Output from previous day: 08/28 0701 - 08/29 0700 In: 840 [P.O.:840] Out: -  Intake/Output this shift: No intake/output data recorded.  PE: Gen: Alert, NAD, pleasant Pulm: Normal effort Abd: Soft,no tenderness, notdistended, bowel sounds present in all 4 quadrants, no HSM,midline surgical scar present Skin: warm and dry, no rashes  Psych: A&Ox3  Lab Results:  Recent Labs    02/17/18 0444 02/18/18 0550  WBC 4.5 3.9*  HGB 10.8* 10.7*  HCT 32.4* 32.2*  PLT 194 181   BMET Recent Labs    02/17/18 0444 02/18/18 0550  NA 138 137  K 3.8 4.0  CL 104 103  CO2 28 27  GLUCOSE 95 97  BUN 11 18  CREATININE 0.85 0.84  CALCIUM 9.3 8.9   PT/INR No results for input(s): LABPROT, INR in the last 72 hours. CMP     Component Value Date/Time   NA 137 02/18/2018 0550   K 4.0 02/18/2018 0550   CL 103 02/18/2018 0550   CO2 27 02/18/2018 0550   GLUCOSE 97 02/18/2018 0550   BUN 18 02/18/2018 0550   CREATININE 0.84 02/18/2018 0550   CALCIUM 8.9 02/18/2018 0550   PROT 7.5 02/10/2018 1710   ALBUMIN 4.1 02/10/2018 1710   AST 42 (H) 02/10/2018 1710   ALT 37 02/10/2018 1710   ALKPHOS 71 02/10/2018 1710   BILITOT 0.9 02/10/2018 1710   GFRNONAA >60 02/18/2018 0550   GFRAA >60 02/18/2018 0550   Lipase     Component Value Date/Time   LIPASE 30 02/10/2018 1710       Studies/Results: No results found.  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

## 2018-02-18 NOTE — Discharge Summary (Signed)
Physician Discharge Summary  DENZIL MCEACHRON CHY:[id number] DOB: Sep 03, 1940 DOA: 02/10/2018  PCP: Shirline Frees, MD  Admit date: 02/10/2018 Discharge date: 02/18/2018  Admitted From: Home  Disposition: Home   Recommendations for Outpatient Follow-up:  1. Follow up with PCP in 1-2 weeks 2. Please obtain BMP/CBC in one week 3. Follow up with Sx  Home Health: none  Discharge Condition: stable.  CODE STATUS full code.  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 Last admit for SBO was in July 2018. Patient presents to ED c/o 1 day h/o worsening abd distention, N/V, abd pain which is diffuse. In the ED, CT confirms SBO, suspicious for volvulus or internal hernia. General surgery consulted. Pt admitted for further management.  Subjective No BM today, feels need to have BM.  Diet advanced to soft.    Assessment/Plan: Principal Problem:   SBO (small bowel obstruction) (HCC) Active Problems:   H/O abdominal surgery  High grade partial SBO History of multiple abdominal surgeries in the past Despite multiple attempts, NG tube could not be placed CT abdomen showed: Status post subtotal colectomy with findings consistent with distal small-bowel obstruction  Repeat Abd xray showed persistent high grade SBO but not helpful as per gen surg due to chronically dilated small bowel Tolerating diet, had BM today.  He will be discharge on Miralax, docusate and milk of magnesia.    Chronic pain syndrome Continue MS Contin     Discharge Diagnoses:  Principal Problem:   SBO (small bowel obstruction) (HCC) Active Problems:   H/O abdominal surgery    Discharge Instructions  Discharge Instructions    Diet - low sodium heart healthy   Complete by:  As directed     Increase activity slowly   Complete by:  As directed      Allergies as of 02/18/2018   No Known Allergies     Medication List    STOP taking these medications   CLARITIN-D 24 HOUR PO     TAKE these medications   docusate sodium 100 MG capsule Commonly known as:  COLACE Take 1 capsule (100 mg total) by mouth 2 (two) times daily.   guaiFENesin 600 MG 12 hr tablet Commonly known as:  MUCINEX Take 600 mg by mouth daily.   lidocaine 5 % Commonly known as:  LIDODERM Place 1 patch onto the skin daily as needed (for back pain). Remove & Discard patch within 12 hours or as directed by MD   magnesium hydroxide 400 MG/5ML suspension Commonly known as:  MILK OF MAGNESIA Take 15 mLs by mouth daily. Start taking on:  02/19/2018   morphine 30 MG 12 hr tablet Commonly known as:  MS CONTIN Take 30 mg by mouth every 12 (twelve) hours.   omeprazole 20 MG capsule Commonly known as:  PRILOSEC Take 20 mg by mouth at bedtime.   polyethylene glycol packet Commonly known as:  MIRALAX / GLYCOLAX Take 17 g by mouth daily. Start taking on:  02/19/2018   PRESERVISION AREDS 2 PO Take 1 tablet by mouth daily at 2 PM.   PROBIOTIC PO Take 1 tablet by mouth daily with breakfast. Nature's Bounty 20 million culture.   vitamin C 1000 MG tablet Take 1,000 mg by mouth daily at 2 PM.   VITAMIN D3 PO Take 1 tablet by mouth daily at  2 PM.   vitamin E 400 UNIT capsule Take 400 Units by mouth daily at 2 PM.      Follow-up Information    Georganna Skeans, MD. Call.   Specialty:  General Surgery Why:  Call for a follow up appointment in 3-4 weeks.  Contact information: [place] [address] [phone]          No Known Allergies  Consultations: Surgery   Procedures/Studies: Ct Abdomen Pelvis W Contrast  Result Date: 02/10/2018 CLINICAL DATA:  Bowel obstruction multiple surgery EXAM: CT ABDOMEN AND PELVIS WITH CONTRAST TECHNIQUE: Multidetector CT imaging  of the abdomen and pelvis was performed using the standard protocol following bolus administration of intravenous contrast. CONTRAST:  128mL ISOVUE-300 IOPAMIDOL (ISOVUE-300) INJECTION 61% COMPARISON:  12/30/2016, CT 12/29/2016, 09/01/2016, 02/13/2016 FINDINGS: Lower chest: Lung bases demonstrate no acute consolidation or effusion. The heart size is normal. Hepatobiliary: Status post cholecystectomy. Stable mild intra and extrahepatic biliary dilatation. Pancreas: Unremarkable. No pancreatic ductal dilatation or surrounding inflammatory changes. Atrophic Spleen: Normal in size without focal abnormality. Adrenals/Urinary Tract: Adrenal glands are unremarkable. Kidneys are normal, without renal calculi, focal lesion, or hydronephrosis. Bladder is unremarkable. Stomach/Bowel: Stomach is nonenlarged. Dilated fluid-filled distal small bowel measuring up to 4 cm. Status post subtotal colectomy. Transition point visualized within the upper pelvis near the midline where there is swirled appearance of mesentery and vasculature, this is best seen on sagittal images series 7 image 49 through 51. No bowel wall thickening. No pneumatosis. Vascular/Lymphatic: Moderate aortic atherosclerosis. No aneurysm. No significantly enlarged lymph nodes. Reproductive: Slightly enlarged prostate with calcification Other: Negative for free air or free fluid.  Ventral scarring Musculoskeletal: No acute or significant osseous findings. IMPRESSION: 1. Status post subtotal colectomy with findings consistent with distal small-bowel obstruction. Transition point appears to be within the upper pelvis where there is swirled appearance of mesentery and vascular structures, possibly representing mesenteric twist/volvulus or internal hernia. No pneumatosis or free air. 2. Status post cholecystectomy with stable mild intra and extrahepatic biliary dilatation. Electronically Signed   By: Donavan Foil M.D.   On: 02/10/2018 19:48   Dg Abd 2  Views  Result Date: 02/14/2018 CLINICAL DATA:  Follow-up small bowel obstruction EXAM: ABDOMEN - 2 VIEW COMPARISON:  02/11/2018 FINDINGS: Persistent small bowel obstruction pattern with dilated fluid and air-filled small intestine. Some air does reach the colon, indicating that this is not complete. No sign of free air. IMPRESSION: Persistent high-grade partial small bowel obstruction pattern. Electronically Signed   By: Nelson Chimes M.D.   On: 02/14/2018 11:53   Dg Abd 2 Views  Result Date: 02/11/2018 CLINICAL DATA:  Small bowel obstruction EXAM: ABDOMEN - 2 VIEW COMPARISON:  02/10/2018 FINDINGS: Diffuse gaseous distention of bowel, most pronounced involving the small bowel concerning for small bowel obstruction. Prior cholecystectomy. No free air organomegaly. No significant change since prior study. IMPRESSION: Distal small bowel obstruction pattern again noted, not significantly changed. Electronically Signed   By: Rolm Baptise M.D.   On: 02/11/2018 09:36   Dg Abd Portable 1v-small Bowel Protocol-position Verification  Result Date: 02/10/2018 CLINICAL DATA:  NG tube placement EXAM: PORTABLE ABDOMEN - 1 VIEW COMPARISON:  CT 02/10/2018 FINDINGS: No esophageal tube is visualized within the abdomen. Multiple dilated loops of bowel up to 5.5 cm consistent with a bowel obstruction. IMPRESSION: 1. No esophageal tube is visualized over the abdomen or lower chest. 2. Multiple loops of dilated bowel, consistent with a bowel obstruction. Electronically Signed  By: Donavan Foil M.D.   On: 02/10/2018 23:43      Subjective: Feeling better, abdomen is softer. Had BM today and last night  Discharge Exam: Vitals:   02/17/18 2059 02/18/18 0433  BP: 133/67 125/67  Pulse: 71 60  Resp: 16 16  Temp: 98.2 F (36.8 C) 97.9 F (36.6 C)  SpO2: 100% 99%   Vitals:   02/17/18 0424 02/17/18 1558 02/17/18 2059 02/18/18 0433  BP: (!) 142/69 (!) 118/57 133/67 125/67  Pulse: 74 72 71 60  Resp: 16 18 16 16    Temp: 98.4 F (36.9 C) 97.6 F (36.4 C) 98.2 F (36.8 C) 97.9 F (36.6 C)  TempSrc: Oral Oral Oral Oral  SpO2: 95% 100% 100% 99%  Weight:      Height:        General: Pt is alert, awake, not in acute distress Cardiovascular: RRR, S1/S2 +, no rubs, no gallops Respiratory: CTA bilaterally, no wheezing, no rhonchi Abdominal: Soft, NT, ND, bowel sounds + Extremities: no edema, no cyanosis    The results of significant diagnostics from this hospitalization (including imaging, microbiology, ancillary and laboratory) are listed below for reference.     Microbiology: No results found for this or any previous visit (from the past 240 hour(s)).   Labs: BNP (last 3 results) No results for input(s): BNP in the last 8760 hours. Basic Metabolic Panel: Recent Labs  Lab 02/14/18 1013 02/15/18 0444 02/16/18 0659 02/17/18 0444 02/18/18 0550  NA 134* 136 137 138 137  K 3.8 4.1 3.8 3.8 4.0  CL 98 104 100 104 103  CO2 30 29 27 28 27   GLUCOSE 117* 88 94 95 97  BUN 6* 7* 8 11 18   CREATININE 0.91 0.87 0.89 0.85 0.84  CALCIUM 9.0 8.8* 9.7 9.3 8.9   Liver Function Tests: No results for input(s): AST, ALT, ALKPHOS, BILITOT, PROT, ALBUMIN in the last 168 hours. No results for input(s): LIPASE, AMYLASE in the last 168 hours. No results for input(s): AMMONIA in the last 168 hours. CBC: Recent Labs  Lab 02/14/18 1013 02/15/18 0444 02/16/18 0659 02/17/18 0444 02/18/18 0550  WBC 4.0 2.9* 4.6 4.5 3.9*  NEUTROABS 2.7 1.3* 2.9 2.5 1.8  HGB 11.8* 10.7* 10.9* 10.8* 10.7*  HCT 35.7* 32.4* 32.6* 32.4* 32.2*  MCV 87.7 87.6 86.9 87.1 88.2  PLT 169 160 177 194 181   Cardiac Enzymes: No results for input(s): CKTOTAL, CKMB, CKMBINDEX, TROPONINI in the last 168 hours. BNP: Invalid input(s): POCBNP CBG: No results for input(s): GLUCAP in the last 168 hours. D-Dimer No results for input(s): DDIMER in the last 72 hours. Hgb A1c No results for input(s): HGBA1C in the last 72 hours. Lipid  Profile No results for input(s): CHOL, HDL, LDLCALC, TRIG, CHOLHDL, LDLDIRECT in the last 72 hours. Thyroid function studies No results for input(s): TSH, T4TOTAL, T3FREE, THYROIDAB in the last 72 hours.  Invalid input(s): FREET3 Anemia work up No results for input(s): VITAMINB12, FOLATE, FERRITIN, TIBC, IRON, RETICCTPCT in the last 72 hours. Urinalysis    Component Value Date/Time   COLORURINE YELLOW 02/10/2018 2016   APPEARANCEUR CLEAR 02/10/2018 2016   LABSPEC 1.008 02/10/2018 2016   PHURINE 5.0 02/10/2018 2016   GLUCOSEU NEGATIVE 02/10/2018 2016   HGBUR NEGATIVE 02/10/2018 2016   BILIRUBINUR NEGATIVE 02/10/2018 2016   KETONESUR NEGATIVE 02/10/2018 2016   PROTEINUR NEGATIVE 02/10/2018 2016   UROBILINOGEN 0.2 10/04/2014 2223   NITRITE NEGATIVE 02/10/2018 2016   LEUKOCYTESUR NEGATIVE 02/10/2018 2016   Sepsis  Labs Invalid input(s): PROCALCITONIN,  WBC,  LACTICIDVEN Microbiology No results found for this or any previous visit (from the past 240 hour(s)).   Time coordinating discharge: 35 minutes.   SIGNED:   Elmarie Shiley, MD  [facility] 02/18/2018, 9:57 AM Pager   If 7PM-7AM, please contact night-coverage [email or web address] Password [email or web address]

## 2018-02-18 NOTE — Discharge Instructions (Signed)
Small Bowel Obstruction A small bowel obstruction is a blockage in the small bowel. The small bowel, which is also called the small intestine, is a Pelley, slender tube that connects the stomach to the colon. When a person eats and drinks, food and fluids go from the stomach to the small bowel. This is where most of the nutrients in the food and fluids are absorbed. A small bowel obstruction will prevent food and fluids from passing through the small bowel as they normally do during digestion. The small bowel can become partially or completely blocked. This can cause symptoms such as abdominal pain, vomiting, and bloating. If this condition is not treated, it can be dangerous because the small bowel could rupture. What are the causes? Common causes of this condition include:  Scar tissue from previous surgery or radiation treatment.  Recent surgery. This may cause the movements of the bowel to slow down and cause food to block the intestine.  Hernias.  Inflammatory bowel disease (colitis).  Twisting of the bowel (volvulus).  Tumors.  A foreign body.  Slipping of a part of the bowel into another part (intussusception).  What are the signs or symptoms? Symptoms of this condition include:  Abdominal pain. This may be dull cramps or sharp pain. It may occur in one area, or it may be present in the entire abdomen. Pain can range from mild to severe, depending on the degree of obstruction.  Nausea and vomiting. Vomit may be greenish or a yellow bile color.  Abdominal bloating.  Constipation.  Lack of passing gas.  Frequent belching.  Diarrhea. This may occur if the obstruction is partial and runny stool is able to leak around the obstruction.  How is this diagnosed? This condition may be diagnosed based on a physical exam, medical history, and X-rays of the abdomen. You may also have other tests, such as a CT scan of the abdomen and pelvis. How is this treated? Treatment for this  condition depends on the cause and severity of the problem. Treatment options may include:  Bed rest along with fluids and pain medicines that are given through an IV tube inserted into one of your veins. Sometimes, this is all that is needed for the obstruction to improve.  Following a simple diet. In some cases, a clear liquid diet may be required for several days. This allows the bowel to rest.  Placement of a small tube (nasogastric tube) into the stomach. When the bowel is blocked, it usually swells up like a balloon that is filled with air and fluids. The air and fluids may be removed by suction through the nasogastric tube. This can help with pain, discomfort, and nausea. It can also help the obstruction to clear up faster.  Surgery. This may be required if other treatments do not work. Bowel obstruction from a hernia may require early surgery and can be an emergency procedure. Surgery may also be required for scar tissue that causes frequent or severe obstructions.  Follow these instructions at home:  Get plenty of rest.  Follow instructions from your health care provider about eating restrictions. You may need to avoid solid foods and consume only clear liquids until your condition improves.  Take over-the-counter and prescription medicines only as told by your health care provider.  Keep all follow-up visits as told by your health care provider. This is important. Contact a health care provider if:  You have a fever.  You have chills. Get help right away if:  You have increased pain or cramping. °· You vomit blood. °· You have uncontrolled vomiting or nausea. °· You cannot drink fluids because of vomiting or pain. °· You develop confusion. °· You begin feeling very dry or thirsty (dehydrated). °· You have severe bloating. °· You feel extremely weak or you faint. °This information is not intended to replace advice given to you by your health care provider. Make sure you discuss any  questions you have with your health care provider. °Document Released: 08/26/2005 Document Revised: 02/04/2016 Document Reviewed: 08/03/2014 °[employer] Interactive Patient Education © 2018 [employer] ° °

## 2018-05-25 ENCOUNTER — Other Ambulatory Visit: Payer: Self-pay | Admitting: Orthopaedic Surgery

## 2018-05-25 DIAGNOSIS — M5106 Intervertebral disc disorders with myelopathy, lumbar region: Secondary | ICD-10-CM

## 2018-06-07 ENCOUNTER — Ambulatory Visit
Admission: RE | Admit: 2018-06-07 | Discharge: 2018-06-07 | Disposition: A | Discharge status: Home / Self Care | Payer: Medicare Other | Source: Ambulatory Visit | Attending: Orthopaedic Surgery | Admitting: Orthopaedic Surgery

## 2018-06-07 DIAGNOSIS — M5106 Intervertebral disc disorders with myelopathy, lumbar region: Secondary | ICD-10-CM

## 2018-06-07 MED ORDER — GADOBENATE DIMEGLUMINE 529 MG/ML IV SOLN
15.0000 mL | Freq: Once | INTRAVENOUS | Status: AC | PRN
  Administered 2018-06-07: 15 mL via INTRAVENOUS

## 2018-06-26 ENCOUNTER — Encounter (HOSPITAL_COMMUNITY): Payer: Self-pay

## 2018-06-26 ENCOUNTER — Inpatient Hospital Stay (HOSPITAL_COMMUNITY): Payer: Medicare Other

## 2018-06-26 ENCOUNTER — Emergency Department (HOSPITAL_COMMUNITY): Payer: Medicare Other

## 2018-06-26 ENCOUNTER — Other Ambulatory Visit: Payer: Self-pay

## 2018-06-26 ENCOUNTER — Inpatient Hospital Stay (HOSPITAL_COMMUNITY)
Admission: EM | Admit: 2018-06-26 | Discharge: 2018-06-28 | DRG: 390 | Disposition: A | Discharge status: Home / Self Care | Payer: Medicare Other | Attending: Surgery | Admitting: Surgery

## 2018-06-26 DIAGNOSIS — Z79891 Long term (current) use of opiate analgesic: Secondary | ICD-10-CM | POA: Diagnosis not present

## 2018-06-26 DIAGNOSIS — Z85828 Personal history of other malignant neoplasm of skin: Secondary | ICD-10-CM | POA: Diagnosis not present

## 2018-06-26 DIAGNOSIS — M479 Spondylosis, unspecified: Secondary | ICD-10-CM | POA: Diagnosis present

## 2018-06-26 DIAGNOSIS — R739 Hyperglycemia, unspecified: Secondary | ICD-10-CM | POA: Diagnosis present

## 2018-06-26 DIAGNOSIS — Z808 Family history of malignant neoplasm of other organs or systems: Secondary | ICD-10-CM | POA: Diagnosis not present

## 2018-06-26 DIAGNOSIS — K56600 Partial intestinal obstruction, unspecified as to cause: Secondary | ICD-10-CM | POA: Diagnosis present

## 2018-06-26 DIAGNOSIS — G8929 Other chronic pain: Secondary | ICD-10-CM | POA: Diagnosis present

## 2018-06-26 DIAGNOSIS — Z79899 Other long term (current) drug therapy: Secondary | ICD-10-CM | POA: Diagnosis not present

## 2018-06-26 DIAGNOSIS — Z9049 Acquired absence of other specified parts of digestive tract: Secondary | ICD-10-CM | POA: Diagnosis not present

## 2018-06-26 DIAGNOSIS — Z87891 Personal history of nicotine dependence: Secondary | ICD-10-CM

## 2018-06-26 DIAGNOSIS — Z9852 Vasectomy status: Secondary | ICD-10-CM

## 2018-06-26 DIAGNOSIS — K56609 Unspecified intestinal obstruction, unspecified as to partial versus complete obstruction: Secondary | ICD-10-CM | POA: Diagnosis present

## 2018-06-26 DIAGNOSIS — K219 Gastro-esophageal reflux disease without esophagitis: Secondary | ICD-10-CM | POA: Diagnosis present

## 2018-06-26 DIAGNOSIS — Z8249 Family history of ischemic heart disease and other diseases of the circulatory system: Secondary | ICD-10-CM | POA: Diagnosis not present

## 2018-06-26 DIAGNOSIS — Z0189 Encounter for other specified special examinations: Secondary | ICD-10-CM

## 2018-06-26 DIAGNOSIS — M549 Dorsalgia, unspecified: Secondary | ICD-10-CM | POA: Diagnosis present

## 2018-06-26 DIAGNOSIS — R52 Pain, unspecified: Secondary | ICD-10-CM

## 2018-06-26 LAB — COMPREHENSIVE METABOLIC PANEL
ALT: 124 U/L — ABNORMAL HIGH (ref 0–44)
ANION GAP: 9 (ref 5–15)
AST: 53 U/L — ABNORMAL HIGH (ref 15–41)
Albumin: 4.4 g/dL (ref 3.5–5.0)
Alkaline Phosphatase: 95 U/L (ref 38–126)
BILIRUBIN TOTAL: 0.3 mg/dL (ref 0.3–1.2)
BUN: 12 mg/dL (ref 8–23)
CO2: 32 mmol/L (ref 22–32)
Calcium: 10.3 mg/dL (ref 8.9–10.3)
Chloride: 97 mmol/L — ABNORMAL LOW (ref 98–111)
Creatinine, Ser: 0.99 mg/dL (ref 0.61–1.24)
GFR calc Af Amer: >60 mL/min (ref >60)
GFR calc non Af Amer: >60 mL/min (ref >60)
Glucose, Bld: 158 mg/dL — ABNORMAL HIGH (ref 70–99)
Potassium: 4.7 mmol/L (ref 3.5–5.1)
SODIUM: 138 mmol/L (ref 135–145)
TOTAL PROTEIN: 8.2 g/dL — AB (ref 6.5–8.1)

## 2018-06-26 LAB — CBC
HEMATOCRIT: 41.9 % (ref 39.0–52.0)
Hemoglobin: 14.1 g/dL (ref 13.0–17.0)
MCH: 29.6 pg (ref 26.0–34.0)
MCHC: 33.7 g/dL (ref 30.0–36.0)
MCV: 88 fL (ref 80.0–100.0)
Platelets: 235 10*3/uL (ref 150–400)
RBC: 4.76 MIL/uL (ref 4.22–5.81)
RDW: 13.2 % (ref 11.5–15.5)
WBC: 7.7 10*3/uL (ref 4.0–10.5)
nRBC: 0 % (ref 0.0–0.2)

## 2018-06-26 LAB — LACTIC ACID, PLASMA: Lactic Acid, Venous: 0.9 mmol/L (ref 0.5–1.9)

## 2018-06-26 LAB — LIPASE, BLOOD: Lipase: 21 U/L (ref 11–51)

## 2018-06-26 MED ORDER — ONDANSETRON 4 MG PO TBDP
4.0000 mg | ORAL_TABLET | Freq: Once | ORAL | Status: AC | PRN
  Administered 2018-06-26: 4 mg via ORAL
  Filled 2018-06-26: qty 1

## 2018-06-26 MED ORDER — IOPAMIDOL (ISOVUE-300) INJECTION 61%
INTRAVENOUS | Status: AC
  Filled 2018-06-26: qty 50

## 2018-06-26 MED ORDER — HYDROMORPHONE HCL 1 MG/ML IJ SOLN
1.0000 mg | INTRAMUSCULAR | Status: DC | PRN
  Administered 2018-06-26 (×2): 1 mg via INTRAVENOUS
  Filled 2018-06-26 (×2): qty 1

## 2018-06-26 MED ORDER — SODIUM CHLORIDE 0.9 % IV SOLN: INTRAVENOUS | Status: DC | PRN

## 2018-06-26 MED ORDER — IOHEXOL 300 MG/ML  SOLN
100.0000 mL | Freq: Once | INTRAMUSCULAR | Status: AC | PRN
  Administered 2018-06-26: 100 mL via INTRAVENOUS

## 2018-06-26 MED ORDER — KCL IN DEXTROSE-NACL 20-5-0.9 MEQ/L-%-% IV SOLN
INTRAVENOUS | Status: DC
  Administered 2018-06-26 – 2018-06-28 (×4): via INTRAVENOUS
  Filled 2018-06-26 (×5): qty 1000

## 2018-06-26 MED ORDER — METHOCARBAMOL 1000 MG/10ML IJ SOLN
1000.0000 mg | Freq: Three times a day (TID) | INTRAVENOUS | Status: DC
  Administered 2018-06-26 – 2018-06-28 (×6): 1000 mg via INTRAVENOUS
  Filled 2018-06-26 (×8): qty 10

## 2018-06-26 MED ORDER — MORPHINE SULFATE (PF) 4 MG/ML IV SOLN
4.0000 mg | Freq: Once | INTRAVENOUS | Status: AC
  Administered 2018-06-26: 4 mg via INTRAVENOUS
  Filled 2018-06-26: qty 1

## 2018-06-26 MED ORDER — ONDANSETRON 4 MG PO TBDP: 4.0000 mg | ORAL_TABLET | Freq: Four times a day (QID) | ORAL | Status: DC | PRN

## 2018-06-26 MED ORDER — DIPHENHYDRAMINE HCL 50 MG/ML IJ SOLN: 12.5000 mg | Freq: Four times a day (QID) | INTRAMUSCULAR | Status: DC | PRN

## 2018-06-26 MED ORDER — DIATRIZOATE MEGLUMINE & SODIUM 66-10 % PO SOLN
90.0000 mL | Freq: Once | ORAL | Status: AC
  Administered 2018-06-26: 90 mL via NASOGASTRIC
  Filled 2018-06-26: qty 90

## 2018-06-26 MED ORDER — ONDANSETRON HCL 4 MG/2ML IJ SOLN
4.0000 mg | Freq: Four times a day (QID) | INTRAMUSCULAR | Status: DC | PRN
  Administered 2018-06-26: 4 mg via INTRAVENOUS
  Filled 2018-06-26 (×2): qty 2

## 2018-06-26 MED ORDER — DIATRIZOATE MEGLUMINE & SODIUM 66-10 % PO SOLN
ORAL | Status: AC
  Filled 2018-06-26: qty 30

## 2018-06-26 MED ORDER — MORPHINE SULFATE (PF) 2 MG/ML IV SOLN
2.0000 mg | INTRAVENOUS | Status: DC | PRN
  Administered 2018-06-26 – 2018-06-27 (×7): 4 mg via INTRAVENOUS
  Filled 2018-06-26 (×3): qty 2
  Filled 2018-06-26: qty 1
  Filled 2018-06-26 (×4): qty 2

## 2018-06-26 MED ORDER — DIPHENHYDRAMINE HCL 12.5 MG/5ML PO ELIX
12.5000 mg | ORAL_SOLUTION | Freq: Four times a day (QID) | ORAL | Status: DC | PRN
  Administered 2018-06-28: 12.5 mg via ORAL
  Filled 2018-06-26: qty 10

## 2018-06-26 NOTE — H&P (Signed)
Reason for Consult:SBO  Referring Physician: Orson Slick  MD CC: abdominal pain  Kent [name] is an 78 y.o. male.    HPI: Patient well-known to our service return to the ED today with abdominal pain since 3 AM and a history of small bowel obstruction.  Work-up in the ED shows he is afebrile but blood pressure somewhat elevated.  140/92 on admission.  Labs show a glucose of 158, potassium of 4.7, AST 53, ALT 124, lipase 21, total bilirubin 0.3.  WBC 7.7, hemoglobin 14.1, hematocrit 41.9, platelets 235,000.  Plain film on admission showed no evidence of free air.  There are multiple fluid levels the abdomen and upright.  Small bowel is dilated up to 3.4 cm.  A CT of the abdomen was obtained. Results are still pending.       Past Medical History:  Diagnosis Date  . 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 Cancer ([place])    basal cell cancer - skin  . Chronic back pain    s/p back surgery  . GERD (gastroesophageal reflux disease)   . GERD (gastroesophageal reflux disease) 08/18/2013  . Incisional hernia   . Incisional hernia, without obstruction or gangrene 02/02/2013  . Postural dizziness with presyncope 09/01/2016  . Sinus infection    currently on antibiotics  . 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 Small bowel obstruction due to adhesions [facility])          Past Surgical History:  Procedure Laterality Date  . BACK SURGERY  08/25/2001   Neck 3 disk replacements  . BACK SURGERY     Placement/removal of nerve stimulator  . CHOLECYSTECTOMY N/A 10/16/2014   Procedure: LAPAROSCOPIC CHOLECYSTECTOMY WITH ATTEMPTED CHOLANGIOGRAM;  Surgeon: Georganna Skeans, MD;  Location: [place];  Service: General;  Laterality: N/A;  . COLON SURGERY  04/2002   Dr. Deon Pilling - Subtotal colectomy, ileostomy  . ELBOW SURGERY Right    for blood vessel & " hung" nerve  . ESOPHAGEAL DILATION    . EXPLORATORY LAPAROTOMY  03/10/2017  . HERNIA REPAIR    . Ileostomy  Takedown  08/2002   Dr. Deon Pilling  . INCISION AND DRAINAGE FOOT    . INCISIONAL HERNIA REPAIR N/A 01/20/2013   Procedure: HERNIA REPAIR INCISIONAL;  Surgeon: Zenovia Jarred, MD;  Location: [place];  Service: General;  Laterality: N/A;  . [place]  04/13/2014   with mesh    dr Grandville Silos  . INCISIONAL HERNIA REPAIR N/A 04/13/2014   Procedure: OPEN REPAIR INCISIONAL HERNIA ;  Surgeon: Georganna Skeans, MD;  Location: [place];  Service: General;  Laterality: N/A;  . INSERTION OF MESH N/A 04/13/2014   Procedure: INSERTION OF MESH;  Surgeon: Georganna Skeans, MD;  Location: [place];  Service: General;  Laterality: N/A;  . KNEE ARTHROSCOPY Right 2010  . LAPAROSCOPIC CHOLECYSTECTOMY  10/16/2014  . LAPAROTOMY N/A 01/20/2013   Procedure: EXPLORATORY LAPAROTOMY;  Surgeon: Zenovia Jarred, MD;  Location: [place];  Service: General;  Laterality: N/A;  . LAPAROTOMY N/A 03/10/2017   Procedure: EXPLORATORY LAPAROTOMY;  Surgeon: Georganna Skeans, MD;  Location: [place];  Service: General;  Laterality: N/A;  . Laparotomy with lysis of adhesions  2010   Dr. Grandville Silos - SBO  . LYSIS OF ADHESION N/A 01/20/2013   Procedure: LYSIS OF ADHESION;  Surgeon: Zenovia Jarred, MD;  Location: [place];  Service: General;  Laterality: N/A;  . LYSIS OF ADHESION N/A 03/10/2017  Procedure: LYSIS OF ADHESION;  Surgeon: Georganna Skeans, MD;  Location: [place];  Service: General;  Laterality: N/A;  . microsurgical  03/24/2002   Lateral recess decompression L5  . SACRAL NERVE STIMULATOR PLACEMENT     inserted and removed  . SHOULDER ARTHROSCOPY Bilateral   . VASECTOMY           Family History  Problem Relation Age of Onset  . Heart disease Mother   . Brain cancer Father     Social History:  reports that he quit smoking about 38 years ago. He quit smokeless tobacco use about 32 years ago.  His smokeless tobacco use included chew. He reports that he does not drink alcohol or use drugs.  Allergies:  No Known Allergies         Prior to Admission medications   Medication Sig Start Date End Date Taking? Authorizing Provider  Ascorbic Acid (VITAMIN C) 1000 MG tablet Take 1,000 mg by mouth at bedtime.    Yes [provider]  Cholecalciferol (VITAMIN D3 PO) Take 1 tablet by mouth at bedtime.    Yes [provider]  docusate sodium (COLACE) 100 MG capsule Take 1 capsule (100 mg total) by mouth 2 (two) times daily. Patient taking differently: Take 100 mg by mouth 3 (three) times daily.  02/18/18  Yes Regalado, Belkys A, MD  Glycerin-Hypromellose-PEG 400 ([employer] DRY EYE RELIEF) 0.2-0.2-1 % SOLN Place 2 drops into both eyes as needed (dry eyes).   Yes [provider]  HYDROcodone-acetaminophen (NORCO) 10-325 MG tablet Take 1 tablet by mouth 3 (three) times daily as needed. 03/21/18  Yes [provider]  lidocaine (LIDODERM) 5 % Place 1 patch onto the skin daily as needed (for back pain). Remove & Discard patch within 12 hours or as directed by MD    Yes [provider]  methocarbamol (ROBAXIN) 500 MG tablet Take 1,000 mg by mouth 2 (two) times daily. 03/21/18  Yes [provider]  morphine (MS CONTIN) 30 MG 12 hr tablet Take 30 mg by mouth every 12 (twelve) hours. 01/20/18  Yes [provider]  Multiple Vitamins-Minerals (PRESERVISION AREDS 2 PO) Take 1 tablet by mouth at bedtime.    Yes [provider]  polyethylene glycol (MIRALAX / GLYCOLAX) packet Take 17 g by mouth daily. Patient taking differently: Take 17 g by mouth daily as needed.  02/19/18  Yes Regalado, Belkys A, MD  vitamin E 400 UNIT capsule Take 400 Units by mouth at bedtime.    Yes [provider]  magnesium hydroxide (MILK OF MAGNESIA) 400 MG/5ML suspension Take 15 mLs by mouth daily. Patient not taking: Reported on 06/26/2018 02/19/18   Elmarie Shiley, MD     LabResultsLast48Hours        Results for orders placed or performed  during the hospital encounter of 06/26/18 (from the past 48 hour(s))  CBC     Status: None   Collection Time: 06/26/18  7:17 AM  Result Value Ref Range   WBC 7.7 4.0 - 10.5 K/uL   RBC 4.76 4.22 - 5.81 MIL/uL   Hemoglobin 14.1 13.0 - 17.0 g/dL   HCT 41.9 39.0 - 52.0 %   MCV 88.0 80.0 - 100.0 fL   MCH 29.6 26.0 - 34.0 pg   MCHC 33.7 30.0 - 36.0 g/dL   RDW 13.2 11.5 - 15.5 %   Platelets 235 150 - 400 K/uL   nRBC 0.0 0.0 - 0.2 %    Comment: Performed  at [facility], [place] [address], [place], [place] [address]       ImagingResults(Last48hours)  Dg Abd 2 Views  Result Date: 06/26/2018 CLINICAL DATA:  78 year old male with central abdominal pain and nausea. Prior history of a bowel obstruction. EXAM: ABDOMEN - 2 VIEW COMPARISON:  Prior abdominal radiographs 02/14/2018 FINDINGS: No evidence of free air on the upright radiograph. The visualized lower lungs are clear. Surgical clips in a right upper quadrant suggest prior cholecystectomy. There are multiple small air-fluid levels in the mid abdomen on the upright view. On the supine view air can be seen within a dilated small bowel in the left mid abdomen. Bowel with measures up to 3.4 cm. Gas is still present within the colon extending to the rectum. Normal colonic stool burden. No organomegaly or abnormal calcifications. Osseous structures are intact and unremarkable. IMPRESSION: 1. Radiographic findings are most consistent with partial small bowel obstruction. There are multiple small air-fluid levels and gas-filled dilated small bowel in the left mid abdomen. Gas persists throughout the colon to the level of the rectum. 2. No evidence of free air. Electronically Signed   By: Jacqulynn Cadet M.D.   On: 06/26/2018 07:50     Review of Systems  Constitutional: Negative.   HENT: Negative.   Eyes: Negative.   Respiratory: Negative.   Cardiovascular: Negative.   Gastrointestinal: Positive for abdominal pain and nausea.  Negative for blood in stool, constipation, diarrhea, heartburn, melena and vomiting.       He notes some bloating for the past week.  Genitourinary: Negative.   Musculoskeletal: Negative.   Skin: Negative.   Neurological: Negative.   Endo/Heme/Allergies: Negative.   Psychiatric/Behavioral: Negative.    Blood pressure (!) 162/87, pulse 83, temperature (!) 97.5 F (36.4 C), temperature source Oral, resp. rate 19, SpO2 100 %. Physical Exam  Constitutional: He is oriented to person, place, and time. He appears well-developed and well-nourished. No distress.  HENT:  Head: Normocephalic and atraumatic.  Mouth/Throat: Oropharynx is clear and moist. No oropharyngeal exudate.  Eyes: Right eye exhibits no discharge. Left eye exhibits no discharge. No scleral icterus.  Pupils are equal  Neck: Normal range of motion. Neck supple. No JVD present. No tracheal deviation present. No thyromegaly present.  Cardiovascular: Normal rate, regular rhythm, normal heart sounds and intact distal pulses.  No murmur heard. Respiratory: Effort normal and breath sounds normal. No respiratory distress. He has no wheezes. He has no rales. He exhibits no tenderness.  GI: He exhibits distension. He exhibits no mass. There is abdominal tenderness. There is no rebound and no guarding.  Bowel sounds are hyperactive  Musculoskeletal:        General: No tenderness or edema.  Lymphadenopathy:    He has no cervical adenopathy.  Neurological: He is alert and oriented to person, place, and time. No cranial nerve deficit.  Skin: Skin is warm and dry. No rash noted. He is not diaphoretic. No erythema. No pallor.  Psychiatric: He has a normal mood and affect. His behavior is normal. Judgment and thought content normal.    Assessment/Plan: Recurrent small bowel obstruction  - Hx subtotal colectomy/ileostomy 11/03, ileostomy takedown 2004, hernia repair 12/2012,              hernia repair 03/2014 exploratory laparotomy 12/2012,  02/2017 exploratory laparotomy hernia        repair 02/2017, prior cholecystectomy  Chronic back pain/ arthritis/placement and removal of nerve stimulator on morphine, hydrocodone, Robaxin  -Dr. Rennis Harding,  Neurosurgery GERD  Plan: Admit the patient, NG placement, as requested radiology place it. IV fluid hydration, bowel protocol later today.  I have reordered his Robaxin and morphine IV for pain control.  I reviewed the CT with Dr. Excell Seltzer there is no evidence of incarceration on the CT.

## 2018-06-26 NOTE — Consult Note (Deleted)
Reason for Consult:SBO  Referring Physician: Orson Slick  MD CC: abdominal pain  Kent [name] is an 78 y.o. male.    HPI: Patient well-known to our service return to the ED today with abdominal pain since 3 AM and a history of small bowel obstruction.  Work-up in the ED shows he is afebrile but blood pressure somewhat elevated.  140/92 on admission.  Labs show a glucose of 158, potassium of 4.7, AST 53, ALT 124, lipase 21, total bilirubin 0.3.  WBC 7.7, hemoglobin 14.1, hematocrit 41.9, platelets 235,000.  Plain film on admission showed no evidence of free air.  There are multiple fluid levels the abdomen and upright.  Small bowel is dilated up to 3.4 cm.  A CT of the abdomen was obtained. Results are still pending.   Past Medical History:  Diagnosis Date  . 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 Cancer ([place])    basal cell cancer - skin  . Chronic back pain    s/p back surgery  . GERD (gastroesophageal reflux disease)   . GERD (gastroesophageal reflux disease) 08/18/2013  . Incisional hernia   . Incisional hernia, without obstruction or gangrene 02/02/2013  . Postural dizziness with presyncope 09/01/2016  . Sinus infection    currently on antibiotics  . 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 Small bowel obstruction due to adhesions [facility])     Past Surgical History:  Procedure Laterality Date  . BACK SURGERY  08/25/2001   Neck 3 disk replacements  . BACK SURGERY     Placement/removal of nerve stimulator  . CHOLECYSTECTOMY N/A 10/16/2014   Procedure: LAPAROSCOPIC CHOLECYSTECTOMY WITH ATTEMPTED CHOLANGIOGRAM;  Surgeon: Georganna Skeans, MD;  Location: [place];  Service: General;  Laterality: N/A;  . COLON SURGERY  04/2002   Dr. Deon Pilling - Subtotal colectomy, ileostomy  . ELBOW SURGERY Right    for blood vessel & " hung" nerve  . ESOPHAGEAL DILATION    . EXPLORATORY LAPAROTOMY  03/10/2017  . HERNIA REPAIR    . Ileostomy Takedown  08/2002   Dr. Deon Pilling  . INCISION AND  DRAINAGE FOOT    . INCISIONAL HERNIA REPAIR N/A 01/20/2013   Procedure: HERNIA REPAIR INCISIONAL;  Surgeon: Zenovia Jarred, MD;  Location: [place];  Service: General;  Laterality: N/A;  . [place]  04/13/2014   with mesh    dr Grandville Silos  . INCISIONAL HERNIA REPAIR N/A 04/13/2014   Procedure: OPEN REPAIR INCISIONAL HERNIA ;  Surgeon: Georganna Skeans, MD;  Location: [place];  Service: General;  Laterality: N/A;  . INSERTION OF MESH N/A 04/13/2014   Procedure: INSERTION OF MESH;  Surgeon: Georganna Skeans, MD;  Location: [place];  Service: General;  Laterality: N/A;  . KNEE ARTHROSCOPY Right 2010  . LAPAROSCOPIC CHOLECYSTECTOMY  10/16/2014  . LAPAROTOMY N/A 01/20/2013   Procedure: EXPLORATORY LAPAROTOMY;  Surgeon: Zenovia Jarred, MD;  Location: [place];  Service: General;  Laterality: N/A;  . LAPAROTOMY N/A 03/10/2017   Procedure: EXPLORATORY LAPAROTOMY;  Surgeon: Georganna Skeans, MD;  Location: [place];  Service: General;  Laterality: N/A;  . Laparotomy with lysis of adhesions  2010   Dr. Grandville Silos - SBO  . LYSIS OF ADHESION N/A 01/20/2013   Procedure: LYSIS OF ADHESION;  Surgeon: Zenovia Jarred, MD;  Location: [place];  Service: General;  Laterality: N/A;  . LYSIS OF ADHESION N/A 03/10/2017   Procedure: LYSIS OF ADHESION;  Surgeon: Grandville Silos,  Lavone Neri, MD;  Location: [place];  Service: General;  Laterality: N/A;  . microsurgical  03/24/2002   Lateral recess decompression L5  . SACRAL NERVE STIMULATOR PLACEMENT     inserted and removed  . SHOULDER ARTHROSCOPY Bilateral   . VASECTOMY      Family History  Problem Relation Age of Onset  . Heart disease Mother   . Brain cancer Father     Social History:  reports that he quit smoking about 38 years ago. He quit smokeless tobacco use about 32 years ago.  His smokeless tobacco use included chew. He reports that he does not drink alcohol or use drugs.  Allergies: No Known Allergies  Prior to Admission medications   Medication Sig Start Date End  Date Taking? Authorizing Provider  Ascorbic Acid (VITAMIN C) 1000 MG tablet Take 1,000 mg by mouth at bedtime.    Yes [provider]  Cholecalciferol (VITAMIN D3 PO) Take 1 tablet by mouth at bedtime.    Yes [provider]  docusate sodium (COLACE) 100 MG capsule Take 1 capsule (100 mg total) by mouth 2 (two) times daily. Patient taking differently: Take 100 mg by mouth 3 (three) times daily.  02/18/18  Yes Regalado, Belkys A, MD  Glycerin-Hypromellose-PEG 400 ([employer] DRY EYE RELIEF) 0.2-0.2-1 % SOLN Place 2 drops into both eyes as needed (dry eyes).   Yes [provider]  HYDROcodone-acetaminophen (NORCO) 10-325 MG tablet Take 1 tablet by mouth 3 (three) times daily as needed. 03/21/18  Yes [provider]  lidocaine (LIDODERM) 5 % Place 1 patch onto the skin daily as needed (for back pain). Remove & Discard patch within 12 hours or as directed by MD    Yes [provider]  methocarbamol (ROBAXIN) 500 MG tablet Take 1,000 mg by mouth 2 (two) times daily. 03/21/18  Yes [provider]  morphine (MS CONTIN) 30 MG 12 hr tablet Take 30 mg by mouth every 12 (twelve) hours. 01/20/18  Yes [provider]  Multiple Vitamins-Minerals (PRESERVISION AREDS 2 PO) Take 1 tablet by mouth at bedtime.    Yes [provider]  polyethylene glycol (MIRALAX / GLYCOLAX) packet Take 17 g by mouth daily. Patient taking differently: Take 17 g by mouth daily as needed.  02/19/18  Yes Regalado, Belkys A, MD  vitamin E 400 UNIT capsule Take 400 Units by mouth at bedtime.    Yes [provider]  magnesium hydroxide (MILK OF MAGNESIA) 400 MG/5ML suspension Take 15 mLs by mouth daily. Patient not taking: Reported on 06/26/2018 02/19/18   Elmarie Shiley, MD     Results for orders placed or performed during the hospital encounter of 06/26/18 (from the past 48 hour(s))  CBC     Status: None   Collection Time: 06/26/18  7:17 AM  Result Value Ref Range    WBC 7.7 4.0 - 10.5 K/uL   RBC 4.76 4.22 - 5.81 MIL/uL   Hemoglobin 14.1 13.0 - 17.0 g/dL   HCT 41.9 39.0 - 52.0 %   MCV 88.0 80.0 - 100.0 fL   MCH 29.6 26.0 - 34.0 pg   MCHC 33.7 30.0 - 36.0 g/dL   RDW 13.2 11.5 - 15.5 %   Platelets 235 150 - 400 K/uL   nRBC 0.0 0.0 - 0.2 %    Comment: Performed at [facility], [place] [address], [place], [place] [address]    Dg Abd 2 Views  Result Date: 06/26/2018 CLINICAL DATA:  78 year old  male with central abdominal pain and nausea. Prior history of a bowel obstruction. EXAM: ABDOMEN - 2 VIEW COMPARISON:  Prior abdominal radiographs 02/14/2018 FINDINGS: No evidence of free air on the upright radiograph. The visualized lower lungs are clear. Surgical clips in a right upper quadrant suggest prior cholecystectomy. There are multiple small air-fluid levels in the mid abdomen on the upright view. On the supine view air can be seen within a dilated small bowel in the left mid abdomen. Bowel with measures up to 3.4 cm. Gas is still present within the colon extending to the rectum. Normal colonic stool burden. No organomegaly or abnormal calcifications. Osseous structures are intact and unremarkable. IMPRESSION: 1. Radiographic findings are most consistent with partial small bowel obstruction. There are multiple small air-fluid levels and gas-filled dilated small bowel in the left mid abdomen. Gas persists throughout the colon to the level of the rectum. 2. No evidence of free air. Electronically Signed   By: Jacqulynn Cadet M.D.   On: 06/26/2018 07:50    Review of Systems  Constitutional: Negative.   HENT: Negative.   Eyes: Negative.   Respiratory: Negative.   Cardiovascular: Negative.   Gastrointestinal: Positive for abdominal pain and nausea. Negative for blood in stool, constipation, diarrhea, heartburn, melena and vomiting.       He notes some bloating for the past week.  Genitourinary: Negative.   Musculoskeletal: Negative.   Skin: Negative.    Neurological: Negative.   Endo/Heme/Allergies: Negative.   Psychiatric/Behavioral: Negative.    Blood pressure (!) 162/87, pulse 83, temperature (!) 97.5 F (36.4 C), temperature source Oral, resp. rate 19, SpO2 100 %. Physical Exam  Constitutional: He is oriented to person, place, and time. He appears well-developed and well-nourished. No distress.  HENT:  Head: Normocephalic and atraumatic.  Mouth/Throat: Oropharynx is clear and moist. No oropharyngeal exudate.  Eyes: Right eye exhibits no discharge. Left eye exhibits no discharge. No scleral icterus.  Pupils are equal  Neck: Normal range of motion. Neck supple. No JVD present. No tracheal deviation present. No thyromegaly present.  Cardiovascular: Normal rate, regular rhythm, normal heart sounds and intact distal pulses.  No murmur heard. Respiratory: Effort normal and breath sounds normal. No respiratory distress. He has no wheezes. He has no rales. He exhibits no tenderness.  GI: He exhibits distension. He exhibits no mass. There is abdominal tenderness. There is no rebound and no guarding.  Bowel sounds are hyperactive  Musculoskeletal:        General: No tenderness or edema.  Lymphadenopathy:    He has no cervical adenopathy.  Neurological: He is alert and oriented to person, place, and time. No cranial nerve deficit.  Skin: Skin is warm and dry. No rash noted. He is not diaphoretic. No erythema. No pallor.  Psychiatric: He has a normal mood and affect. His behavior is normal. Judgment and thought content normal.    Assessment/Plan: Recurrent small bowel obstruction  - Hx subtotal colectomy/ileostomy 11/03, ileostomy takedown 2004, hernia repair 12/2012,              hernia repair 03/2014 exploratory laparotomy 12/2012, 02/2017 exploratory laparotomy hernia        repair 02/2017, prior cholecystectomy  Chronic back pain/ arthritis/placement and removal of nerve stimulator on morphine, hydrocodone, Robaxin  -Dr. Rennis Harding,  Neurosurgery GERD  Plan: Admit the patient, NG placement, as requested radiology place it. IV fluid hydration, bowel protocol later today.  I have reordered his Robaxin and morphine IV for pain control.  Shadi Sessler 06/26/2018, 8:45 AM

## 2018-06-26 NOTE — Progress Notes (Signed)
Kent [name] [id number] Admission Data: 06/26/2018 5:39 PM Attending Provider: Nolon Nations, MD  FFM:[id number], [name], MD Consults/ Treatment Team: Treatment Team:  Edison Pace, Md, MD  Kent [name] is a 78 y.o. male patient admitted from ED awake, alert  & orientated  X 3,  Full Code, VSS - Blood pressure (!) 165/74, pulse 84, temperature (!) 97.5 F (36.4 C), temperature source Oral, resp. rate 16, SpO2 98 %. RA, no c/o shortness of breath, no c/o chest pain, no distress noted. Tele # 13 placed and pt is currently running:normal sinus rhythm.   IV site WDL:  antecubital right, condition patent and no redness with a transparent dsg that's clean dry and intact.  Allergies:  No Known Allergies   Past Medical History:  Diagnosis Date  . 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 Cancer ([place])    basal cell cancer - skin  . Chronic back pain    s/p back surgery  . GERD (gastroesophageal reflux disease)   . GERD (gastroesophageal reflux disease) 08/18/2013  . Incisional hernia   . Incisional hernia, without obstruction or gangrene 02/02/2013  . Postural dizziness with presyncope 09/01/2016  . Sinus infection    currently on antibiotics  . 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 Information packet given to patient/family and safety video watched.  Admission INP armband ID verified with patient/family, and in place. SR up x 2, fall risk assessment complete with Patient and family verbalizing understanding of risks associated with falls. Pt verbalizes an understanding of how to use the call bell and to call for help before getting out of bed.  Skin, clean-dry- intact without evidence of bruising, or skin tears.   No evidence of skin break down noted on exam.  Per MAR, patient received Gastrografin @ 1543. Patient placed back to low intermittent suction.   Will cont to monitor and assist as  needed.  Hiram Comber, RN 06/26/2018 5:39 PM

## 2018-06-26 NOTE — ED Notes (Signed)
Patient not in room for blood draw.

## 2018-06-26 NOTE — ED Triage Notes (Addendum)
Pt presents for evaluation of abd pain since 3 this AM. Reports hx of bowel obstruction. Pt reports last event was 8/18. Pt reports some nausea.

## 2018-06-26 NOTE — ED Provider Notes (Signed)
[facility] EMERGENCY DEPARTMENT Provider Note   CSN: [id number] Arrival date & time: 06/26/18  7124     History   Chief Complaint Chief Complaint  Patient presents with  . Abdominal Pain    HPI Kent [name] is a 78 y.o. male.  Complains of abdominal pain at mid abdomen crampy in nature constant severe onset 3 AM today feels like bowel obstruction is had in the past.  Last bowel movement 11 PM yesterday.  Other associated symptoms include nausea no vomiting.  No fever.  Nothing makes pain better or worse.  No treatment prior to coming here.  HPI  Past Medical History:  Diagnosis Date  . 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 Cancer ([place])    basal cell cancer - skin  . Chronic back pain    s/p back surgery  . GERD (gastroesophageal reflux disease)   . GERD (gastroesophageal reflux disease) 08/18/2013  . Incisional hernia   . Incisional hernia, without obstruction or gangrene 02/02/2013  . Postural dizziness with presyncope 09/01/2016  . Sinus infection    currently on antibiotics  . Small bowel obstruction due to adhesions ([place])   . Small bowel obstruction due to adhesions [facility])     Patient Active Problem List   Diagnosis Date Noted  . S/P exploratory laparotomy 03/10/2017  . Elevated blood pressure reading 12/30/2016  . Nonspecific abnormal electrocardiogram (ECG) (EKG) 09/01/2016  . History of colectomy 02/13/2016  . Intractable abdominal pain 02/13/2016  . S/P laparoscopic cholecystectomy 10/16/2014  . S/P hernia repair 04/13/2014  . Pre-op evaluation 03/24/2014  . H/O abdominal surgery 08/18/2013  . GERD (gastroesophageal reflux disease) 08/18/2013  . Chronic back pain 08/18/2013  . SBO (small bowel obstruction) ([place]) 08/12/2013    Past Surgical History:  Procedure Laterality Date  . BACK SURGERY  08/25/2001   Neck 3 disk replacements  . BACK SURGERY     Placement/removal of nerve stimulator  . CHOLECYSTECTOMY N/A 10/16/2014   Procedure:  LAPAROSCOPIC CHOLECYSTECTOMY WITH ATTEMPTED CHOLANGIOGRAM;  Surgeon: Georganna Skeans, MD;  Location: [facility];  Service: General;  Laterality: N/A;  . COLON SURGERY  04/2002   Dr. Deon Pilling - Subtotal colectomy, ileostomy  . ELBOW SURGERY Right    for blood vessel & " hung" nerve  . ESOPHAGEAL DILATION    . EXPLORATORY LAPAROTOMY  03/10/2017  . HERNIA REPAIR    . Ileostomy Takedown  08/2002   Dr. Deon Pilling  . INCISION AND DRAINAGE FOOT    . INCISIONAL HERNIA REPAIR N/A 01/20/2013   Procedure: HERNIA REPAIR INCISIONAL;  Surgeon: Zenovia Jarred, MD;  Location: [facility];  Service: General;  Laterality: N/A;  . [place]  04/13/2014   with mesh    dr Grandville Silos  . INCISIONAL HERNIA REPAIR N/A 04/13/2014   Procedure: OPEN REPAIR INCISIONAL HERNIA ;  Surgeon: Georganna Skeans, MD;  Location: [place];  Service: General;  Laterality: N/A;  . INSERTION OF MESH N/A 04/13/2014   Procedure: INSERTION OF MESH;  Surgeon: Georganna Skeans, MD;  Location: [facility];  Service: General;  Laterality: N/A;  . KNEE ARTHROSCOPY Right 2010  . LAPAROSCOPIC CHOLECYSTECTOMY  10/16/2014  . LAPAROTOMY N/A 01/20/2013   Procedure: EXPLORATORY LAPAROTOMY;  Surgeon: Zenovia Jarred, MD;  Location: [place];  Service: General;  Laterality: N/A;  . LAPAROTOMY N/A 03/10/2017   Procedure: EXPLORATORY LAPAROTOMY;  Surgeon: Georganna Skeans, MD;  Location: [facility];  Service: General;  Laterality:  N/A;  . Laparotomy with lysis of adhesions  2010   Dr. Grandville Silos - SBO  . LYSIS OF ADHESION N/A 01/20/2013   Procedure: LYSIS OF ADHESION;  Surgeon: Zenovia Jarred, MD;  Location: [facility];  Service: General;  Laterality: N/A;  . LYSIS OF ADHESION N/A 03/10/2017   Procedure: LYSIS OF ADHESION;  Surgeon: Georganna Skeans, MD;  Location: [facility];  Service: General;  Laterality: N/A;  . microsurgical  03/24/2002   Lateral recess decompression L5  . SACRAL NERVE STIMULATOR PLACEMENT     inserted and removed  . SHOULDER ARTHROSCOPY Bilateral   . VASECTOMY           Home Medications    Prior to Admission medications   Medication Sig Start Date End Date Taking? Authorizing Provider  Ascorbic Acid (VITAMIN C) 1000 MG tablet Take 1,000 mg by mouth daily at 2 PM.    [provider]  Cholecalciferol (VITAMIN D3 PO) Take 1 tablet by mouth daily at 2 PM.    [provider]  docusate sodium (COLACE) 100 MG capsule Take 1 capsule (100 mg total) by mouth 2 (two) times daily. 02/18/18   Regalado, Belkys A, MD  guaiFENesin (MUCINEX) 600 MG 12 hr tablet Take 600 mg by mouth daily.     [provider]  lidocaine (LIDODERM) 5 % Place 1 patch onto the skin daily as needed (for back pain). Remove & Discard patch within 12 hours or as directed by MD     [provider]  magnesium hydroxide (MILK OF MAGNESIA) 400 MG/5ML suspension Take 15 mLs by mouth daily. 02/19/18   Regalado, Belkys A, MD  morphine (MS CONTIN) 30 MG 12 hr tablet Take 30 mg by mouth every 12 (twelve) hours. 01/20/18   [provider]  Multiple Vitamins-Minerals (PRESERVISION AREDS 2 PO) Take 1 tablet by mouth daily at 2 PM.    [provider]  omeprazole (PRILOSEC) 20 MG capsule Take 20 mg by mouth at bedtime.  02/03/16   [provider]  polyethylene glycol (MIRALAX / GLYCOLAX) packet Take 17 g by mouth daily. 02/19/18   Regalado, Jerald Kief A, MD  Probiotic Product (PROBIOTIC PO) Take 1 tablet by mouth daily with breakfast. Nature's Bounty 20 million culture.    [provider]  vitamin E 400 UNIT capsule Take 400 Units by mouth daily at 2 PM.     [provider]    Family History Family History  Problem Relation Age of Onset  . Heart disease Mother   . Brain cancer Father     Social History Social History   Tobacco Use  . Smoking status: Former Smoker    Last attempt to quit: 10/09/1979    Years since quitting: 38.7  . Smokeless tobacco: Former Systems developer    Types: Chew    Quit date: 03/05/1986  Substance Use  Topics  . Alcohol use: No  . Drug use: No     Allergies   Patient has no known allergies.   Review of Systems Review of Systems  Constitutional: Negative.   HENT: Negative.   Respiratory: Negative.   Cardiovascular: Negative.   Gastrointestinal: Positive for abdominal pain and nausea.  Musculoskeletal: Positive for back pain.       Chronic back pain  Skin: Negative.   Neurological: Negative.   Psychiatric/Behavioral: Negative.   All other systems reviewed and are negative.    Physical Exam Updated Vital Signs BP (!) 140/92 (BP Location: Right Arm)  Pulse 85   Temp (!) 97.5 F (36.4 C) (Oral)   Resp 19   Physical Exam Vitals signs and nursing note reviewed.  Constitutional:      Appearance: He is well-developed.  HENT:     Head: Normocephalic and atraumatic.  Eyes:     Conjunctiva/sclera: Conjunctivae normal.     Pupils: Pupils are equal, round, and reactive to light.  Neck:     Musculoskeletal: Neck supple.     Thyroid: No thyromegaly.     Trachea: No tracheal deviation.  Cardiovascular:     Rate and Rhythm: Normal rate and regular rhythm.     Heart sounds: No murmur.  Pulmonary:     Effort: Pulmonary effort is normal.     Breath sounds: Normal breath sounds.  Abdominal:     General: Bowel sounds are normal. There is no distension.     Palpations: Abdomen is soft.     Tenderness: There is abdominal tenderness.     Comments: Diffuse tenderness to percussion  Genitourinary:    Penis: Normal.      Scrotum/Testes: Normal.  Musculoskeletal: Normal range of motion.        General: No tenderness.  Skin:    General: Skin is warm and dry.     Findings: No rash.  Neurological:     Mental Status: He is alert.     Coordination: Coordination normal.      ED Treatments / Results  Labs (all labs ordered are listed, but only abnormal results are displayed) Labs Reviewed  CBC  LIPASE, BLOOD  COMPREHENSIVE METABOLIC PANEL    EKG None  Radiology Dg  Abd 2 Views  Result Date: 06/26/2018 CLINICAL DATA:  78 year old male with central abdominal pain and nausea. Prior history of a bowel obstruction. EXAM: ABDOMEN - 2 VIEW COMPARISON:  Prior abdominal radiographs 02/14/2018 FINDINGS: No evidence of free air on the upright radiograph. The visualized lower lungs are clear. Surgical clips in a right upper quadrant suggest prior cholecystectomy. There are multiple small air-fluid levels in the mid abdomen on the upright view. On the supine view air can be seen within a dilated small bowel in the left mid abdomen. Bowel with measures up to 3.4 cm. Gas is still present within the colon extending to the rectum. Normal colonic stool burden. No organomegaly or abnormal calcifications. Osseous structures are intact and unremarkable. IMPRESSION: 1. Radiographic findings are most consistent with partial small bowel obstruction. There are multiple small air-fluid levels and gas-filled dilated small bowel in the left mid abdomen. Gas persists throughout the colon to the level of the rectum. 2. No evidence of free air. Electronically Signed   By: Jacqulynn Cadet M.D.   On: 06/26/2018 07:50    Procedures Procedures (including critical care time)  Medications Ordered in ED Medications  ondansetron (ZOFRAN-ODT) disintegrating tablet 4 mg (4 mg Oral Given 06/26/18 0708)   Results for orders placed or performed during the hospital encounter of 06/26/18  Lipase, blood  Result Value Ref Range   Lipase 21 11 - 51 U/L  Comprehensive metabolic panel  Result Value Ref Range   Sodium 138 135 - 145 mmol/L   Potassium 4.7 3.5 - 5.1 mmol/L   Chloride 97 (L) 98 - 111 mmol/L   CO2 32 22 - 32 mmol/L   Glucose, Bld 158 (H) 70 - 99 mg/dL   BUN 12 8 - 23 mg/dL   Creatinine, Ser 0.99 0.61 - 1.24 mg/dL   Calcium 10.3 8.9 -  10.3 mg/dL   Total Protein 8.2 (H) 6.5 - 8.1 g/dL   Albumin 4.4 3.5 - 5.0 g/dL   AST 53 (H) 15 - 41 U/L   ALT 124 (H) 0 - 44 U/L   Alkaline Phosphatase 95  38 - 126 U/L   Total Bilirubin 0.3 0.3 - 1.2 mg/dL   GFR calc non Af Amer >60 >60 mL/min   GFR calc Af Amer >60 >60 mL/min   Anion gap 9 5 - 15  CBC  Result Value Ref Range   WBC 7.7 4.0 - 10.5 K/uL   RBC 4.76 4.22 - 5.81 MIL/uL   Hemoglobin 14.1 13.0 - 17.0 g/dL   HCT 41.9 39.0 - 52.0 %   MCV 88.0 80.0 - 100.0 fL   MCH 29.6 26.0 - 34.0 pg   MCHC 33.7 30.0 - 36.0 g/dL   RDW 13.2 11.5 - 15.5 %   Platelets 235 150 - 400 K/uL   nRBC 0.0 0.0 - 0.2 %   Mr Lumbar Spine W Wo Contrast  Result Date: 06/07/2018 CLINICAL DATA:  Intervertebral lumbar disc disorder with myelopathy Creatinine was obtained on site at [facility] at [address] Results: Creatinine 1.2 mg/dL. EXAM: MRI LUMBAR SPINE WITHOUT AND WITH CONTRAST TECHNIQUE: Multiplanar and multiecho pulse sequences of the lumbar spine were obtained without and with intravenous contrast. CONTRAST:  65mL MULTIHANCE GADOBENATE DIMEGLUMINE 529 MG/ML IV SOLN COMPARISON:  Lumbar MRI 05/09/2015 FINDINGS: Segmentation:  Normal Alignment:  Normal Vertebrae: Negative for fracture or mass. No enhancing lesion in the spine Conus medullaris and cauda equina: Conus extends to the T12-L1 level. Conus and cauda equina appear normal. Paraspinal and other soft tissues: Negative for paraspinous mass or fluid collection Disc levels: L1-2: Negative L2-3: Mild disc bulging and mild facet degeneration. Negative for stenosis L3-4: Shallow central disc protrusion and mild facet degeneration. Negative for stenosis L4-5: Right laminectomy. Mild disc degeneration and disc bulging. Negative for stenosis. Mild facet degeneration bilaterally. L5-S1: Mild disc bulging and mild facet degeneration. Negative for stenosis. IMPRESSION: Postop laminectomy on the right without recurrent disc protrusion or stenosis Small central disc protrusion L3-4.  Negative for lumbar stenosis. Electronically Signed   By: Franchot Gallo M.D.   On: 06/07/2018 16:09   Dg Abd 2  Views  Result Date: 06/26/2018 CLINICAL DATA:  78 year old male with central abdominal pain and nausea. Prior history of a bowel obstruction. EXAM: ABDOMEN - 2 VIEW COMPARISON:  Prior abdominal radiographs 02/14/2018 FINDINGS: No evidence of free air on the upright radiograph. The visualized lower lungs are clear. Surgical clips in a right upper quadrant suggest prior cholecystectomy. There are multiple small air-fluid levels in the mid abdomen on the upright view. On the supine view air can be seen within a dilated small bowel in the left mid abdomen. Bowel with measures up to 3.4 cm. Gas is still present within the colon extending to the rectum. Normal colonic stool burden. No organomegaly or abnormal calcifications. Osseous structures are intact and unremarkable. IMPRESSION: 1. Radiographic findings are most consistent with partial small bowel obstruction. There are multiple small air-fluid levels and gas-filled dilated small bowel in the left mid abdomen. Gas persists throughout the colon to the level of the rectum. 2. No evidence of free air. Electronically Signed   By: Jacqulynn Cadet M.D.   On: 06/26/2018 07:50    X-rays viewed by me. Results for orders placed or performed during the hospital encounter of 06/26/18  Lipase, blood  Result  Value Ref Range   Lipase 21 11 - 51 U/L  Comprehensive metabolic panel  Result Value Ref Range   Sodium 138 135 - 145 mmol/L   Potassium 4.7 3.5 - 5.1 mmol/L   Chloride 97 (L) 98 - 111 mmol/L   CO2 32 22 - 32 mmol/L   Glucose, Bld 158 (H) 70 - 99 mg/dL   BUN 12 8 - 23 mg/dL   Creatinine, Ser 0.99 0.61 - 1.24 mg/dL   Calcium 10.3 8.9 - 10.3 mg/dL   Total Protein 8.2 (H) 6.5 - 8.1 g/dL   Albumin 4.4 3.5 - 5.0 g/dL   AST 53 (H) 15 - 41 U/L   ALT 124 (H) 0 - 44 U/L   Alkaline Phosphatase 95 38 - 126 U/L   Total Bilirubin 0.3 0.3 - 1.2 mg/dL   GFR calc non Af Amer >60 >60 mL/min   GFR calc Af Amer >60 >60 mL/min   Anion gap 9 5 - 15  CBC  Result Value  Ref Range   WBC 7.7 4.0 - 10.5 K/uL   RBC 4.76 4.22 - 5.81 MIL/uL   Hemoglobin 14.1 13.0 - 17.0 g/dL   HCT 41.9 39.0 - 52.0 %   MCV 88.0 80.0 - 100.0 fL   MCH 29.6 26.0 - 34.0 pg   MCHC 33.7 30.0 - 36.0 g/dL   RDW 13.2 11.5 - 15.5 %   Platelets 235 150 - 400 K/uL   nRBC 0.0 0.0 - 0.2 %  Lactic acid, plasma  Result Value Ref Range   Lactic Acid, Venous 0.9 0.5 - 1.9 mmol/L   Dg Abd 1 View  Result Date: 06/26/2018 CLINICAL DATA:  Status post NG tube placement. EXAM: ABDOMEN - 1 VIEW COMPARISON:  None. FINDINGS: NG tube is in place with the tip in the distal descending duodenum and side-port in the distal stomach. Bowel gas pattern is nonobstructive. IMPRESSION: As above. Electronically Signed   By: Inge Rise M.D.   On: 06/26/2018 13:15   Mr Lumbar Spine W Wo Contrast  Result Date: 06/07/2018 CLINICAL DATA:  Intervertebral lumbar disc disorder with myelopathy Creatinine was obtained on site at [facility] at [address] Results: Creatinine 1.2 mg/dL. EXAM: MRI LUMBAR SPINE WITHOUT AND WITH CONTRAST TECHNIQUE: Multiplanar and multiecho pulse sequences of the lumbar spine were obtained without and with intravenous contrast. CONTRAST:  25mL MULTIHANCE GADOBENATE DIMEGLUMINE 529 MG/ML IV SOLN COMPARISON:  Lumbar MRI 05/09/2015 FINDINGS: Segmentation:  Normal Alignment:  Normal Vertebrae: Negative for fracture or mass. No enhancing lesion in the spine Conus medullaris and cauda equina: Conus extends to the T12-L1 level. Conus and cauda equina appear normal. Paraspinal and other soft tissues: Negative for paraspinous mass or fluid collection Disc levels: L1-2: Negative L2-3: Mild disc bulging and mild facet degeneration. Negative for stenosis L3-4: Shallow central disc protrusion and mild facet degeneration. Negative for stenosis L4-5: Right laminectomy. Mild disc degeneration and disc bulging. Negative for stenosis. Mild facet degeneration bilaterally. L5-S1: Mild disc bulging and  mild facet degeneration. Negative for stenosis. IMPRESSION: Postop laminectomy on the right without recurrent disc protrusion or stenosis Small central disc protrusion L3-4.  Negative for lumbar stenosis. Electronically Signed   By: Franchot Gallo M.D.   On: 06/07/2018 16:09   Ct Abdomen Pelvis W Contrast  Result Date: 06/26/2018 CLINICAL DATA:  Diffuse abdominal pain with nausea, vomiting and bloating. EXAM: CT ABDOMEN AND PELVIS WITH CONTRAST TECHNIQUE: Multidetector CT imaging of the abdomen and pelvis was  performed using the standard protocol following bolus administration of intravenous contrast. CONTRAST:  118mL OMNIPAQUE IOHEXOL 300 MG/ML  SOLN COMPARISON:  02/10/2018, 12/29/2016 and 09/01/2016 FINDINGS: Lower chest: Lung bases are within normal. Hepatobiliary: Previous cholecystectomy. Liver and biliary tree are normal. Pancreas: Normal. Spleen: Normal. Adrenals/Urinary Tract: Adrenal glands are normal. Kidneys are normal in size without hydronephrosis or nephrolithiasis. Ureters and bladder are normal. Stomach/Bowel: Stomach is within normal. There are multiple dilated air, fluid and stool filled small bowel loops measuring up to 4.2 cm in diameter. Similar changes were present on the previous exam. No definite transition point is identified. Evidence of patient's previous partial colectomy. Air and stool present distally within the rectosigmoid colon. No free peritoneal air. Vascular/Lymphatic: Mild calcified plaque over the abdominal aorta. No adenopathy. Reproductive: Normal. Other: Small amount of free fluid in the pelvis. Small amount of patchy free fluid in the abdomen. No focal inflammatory change. Postsurgical change over the anterior abdominal wall. Musculoskeletal: Minimal degenerative change of the spine and hips. IMPRESSION: Evidence of previous partial colectomy. Multiple dilated small bowel loops measuring up to 4.2 cm in diameter without significant change from 02/10/2018. Minimal patchy  free fluid. Findings may be due to persistent ileus or persistent partial small bowel obstructive process. Aortic Atherosclerosis (ICD10-I70.0). Electronically Signed   By: Marin Olp M.D.   On: 06/26/2018 10:47   Dg Abd 2 Views  Result Date: 06/26/2018 CLINICAL DATA:  78 year old male with central abdominal pain and nausea. Prior history of a bowel obstruction. EXAM: ABDOMEN - 2 VIEW COMPARISON:  Prior abdominal radiographs 02/14/2018 FINDINGS: No evidence of free air on the upright radiograph. The visualized lower lungs are clear. Surgical clips in a right upper quadrant suggest prior cholecystectomy. There are multiple small air-fluid levels in the mid abdomen on the upright view. On the supine view air can be seen within a dilated small bowel in the left mid abdomen. Bowel with measures up to 3.4 cm. Gas is still present within the colon extending to the rectum. Normal colonic stool burden. No organomegaly or abnormal calcifications. Osseous structures are intact and unremarkable. IMPRESSION: 1. Radiographic findings are most consistent with partial small bowel obstruction. There are multiple small air-fluid levels and gas-filled dilated small bowel in the left mid abdomen. Gas persists throughout the colon to the level of the rectum. 2. No evidence of free air. Electronically Signed   By: Jacqulynn Cadet M.D.   On: 06/26/2018 07:50   Dg Addison Bailey G Tube Plc W/fl W/rad  Result Date: 06/26/2018 CLINICAL DATA:  NG tube placement for bowel obstruction EXAM: NASO G TUBE PLACEMENT WITH FL AND WITH RAD CONTRAST:  5 cc Isovue FLUOROSCOPY TIME:  Fluoroscopy Time:  4 minutes 12 seconds Radiation Exposure Index (if provided by the fluoroscopic device): Number of Acquired Spot Images: 1 COMPARISON:  None FINDINGS: Nasogastric tube was placed into the left nares and under fluoroscopic guidance advanced through the esophagus and into the stomach. The tip was placed in the distal stomach. Contrast injection of  water-soluble contrast material confirms intraluminal placement. IMPRESSION: 1. Fluoroscopic guidance was used to place a nasogastric tube into the stomach. Electronically Signed   By: Kerby Moors M.D.   On: 06/26/2018 13:16   Initial Impression / Assessment and Plan / ED Course  I have reviewed the triage vital signs and the nursing notes.  Pertinent labs & imaging results that were available during my care of the patient were reviewed by me and considered in my medical  decision making (see chart for details).     9 AM requesting additional pain medicine for treatment with IV morphine.  Additional intravenous morphine ordered.  Patient alert awake.  Glasgow Coma Score 15 I consulted general surgery service who will evaluate patient for admission and possible surgery.  Surgical consult and requesting CT scan abdomen pelvis.  Ordered by me  Clinically patient has bowel obstruction.  Lab work consistent with mild hyperglycemia otherwise normal . Arrangements made for overnight stay by Mr. Creig Hines, [place] from surgical service . Final Clinical Impressions(s) / ED Diagnoses  Diagnosis #1 partial small bowel obstruction #2 hyperglycemia Final diagnoses:  Pain    ED Discharge Orders    None       Orlie Dakin, MD 06/26/18 [phone]

## 2018-06-26 NOTE — ED Notes (Signed)
Pt to CT

## 2018-06-26 NOTE — ED Notes (Signed)
Pt to IR

## 2018-06-27 ENCOUNTER — Inpatient Hospital Stay (HOSPITAL_COMMUNITY): Payer: Medicare Other

## 2018-06-27 LAB — BASIC METABOLIC PANEL
Anion gap: 9 (ref 5–15)
BUN: 11 mg/dL (ref 8–23)
CO2: 28 mmol/L (ref 22–32)
CREATININE: 0.96 mg/dL (ref 0.61–1.24)
Calcium: 9.9 mg/dL (ref 8.9–10.3)
Chloride: 102 mmol/L (ref 98–111)
GFR calc Af Amer: >60 mL/min (ref >60)
GFR calc non Af Amer: >60 mL/min (ref >60)
GLUCOSE: 131 mg/dL — AB (ref 70–99)
Potassium: 4.7 mmol/L (ref 3.5–5.1)
Sodium: 139 mmol/L (ref 135–145)

## 2018-06-27 LAB — CBC
HCT: 41.6 % (ref 39.0–52.0)
Hemoglobin: 13.8 g/dL (ref 13.0–17.0)
MCH: 29.7 pg (ref 26.0–34.0)
MCHC: 33.2 g/dL (ref 30.0–36.0)
MCV: 89.5 fL (ref 80.0–100.0)
Platelets: 226 10*3/uL (ref 150–400)
RBC: 4.65 MIL/uL (ref 4.22–5.81)
RDW: 13.3 % (ref 11.5–15.5)
WBC: 9 10*3/uL (ref 4.0–10.5)
nRBC: 0 % (ref 0.0–0.2)

## 2018-06-27 NOTE — Progress Notes (Signed)
SBO  Subjective: Feels better.  Had several BM's overnight  Objective: Vital signs in last 24 hours: Temp:  [98.4 F (36.9 C)-99.8 F (37.7 C)] 99.8 F (37.7 C) (01/05 0809) Pulse Rate:  [70-94] 90 (01/05 0809) Resp:  [12-18] 16 (01/05 0615) BP: (151-204)/(66-96) 158/69 (01/05 0809) SpO2:  [94 %-99 %] 97 % (01/05 0809) Weight:  [72.7 kg] 72.7 kg (01/05 0534) Last BM Date: 06/26/18  Intake/Output from previous day: 01/04 0701 - 01/05 0700 In: 1754.8 [I.V.:1328.8; IV Piggyback:426] Out: 1150 [Urine:400; Emesis/NG output:750] Intake/Output this shift: No intake/output data recorded.  General appearance: alert and cooperative GI: normal findings: soft, non-tender  Lab Results:  Results for orders placed or performed during the hospital encounter of 06/26/18 (from the past 24 hour(s))  Lactic acid, plasma     Status: None   Collection Time: 06/26/18  1:00 PM  Result Value Ref Range   Lactic Acid, Venous 0.9 0.5 - 1.9 mmol/L  Basic metabolic panel     Status: Abnormal   Collection Time: 06/27/18  3:50 AM  Result Value Ref Range   Sodium 139 135 - 145 mmol/L   Potassium 4.7 3.5 - 5.1 mmol/L   Chloride 102 98 - 111 mmol/L   CO2 28 22 - 32 mmol/L   Glucose, Bld 131 (H) 70 - 99 mg/dL   BUN 11 8 - 23 mg/dL   Creatinine, Ser 0.96 0.61 - 1.24 mg/dL   Calcium 9.9 8.9 - 10.3 mg/dL   GFR calc non Af Amer >60 >60 mL/min   GFR calc Af Amer >60 >60 mL/min   Anion gap 9 5 - 15  CBC     Status: None   Collection Time: 06/27/18  3:50 AM  Result Value Ref Range   WBC 9.0 4.0 - 10.5 K/uL   RBC 4.65 4.22 - 5.81 MIL/uL   Hemoglobin 13.8 13.0 - 17.0 g/dL   HCT 41.6 39.0 - 52.0 %   MCV 89.5 80.0 - 100.0 fL   MCH 29.7 26.0 - 34.0 pg   MCHC 33.2 30.0 - 36.0 g/dL   RDW 13.3 11.5 - 15.5 %   Platelets 226 150 - 400 K/uL   nRBC 0.0 0.0 - 0.2 %     Studies/Results Radiology     MEDS, Scheduled    Assessment: pSBO  Plan: Clinically appears to have resolved.  AXR shows  contrast in colon  D/c NG Clears, Advance diet as tolerated   LOS: 1 day    Rosario Adie, MD [facility], [place]   06/27/2018 9:28 AM

## 2018-06-28 MED ORDER — METHOCARBAMOL 750 MG PO TABS: 750.0000 mg | ORAL_TABLET | Freq: Three times a day (TID) | ORAL | Status: DC | PRN

## 2018-06-28 MED ORDER — POLYETHYLENE GLYCOL 3350 17 G PO PACK
17.0000 g | PACK | Freq: Every day | ORAL | Status: DC
  Administered 2018-06-28: 17 g via ORAL
  Filled 2018-06-28: qty 1

## 2018-06-28 MED ORDER — OXYCODONE HCL 5 MG PO TABS: 5.0000 mg | ORAL_TABLET | ORAL | Status: DC | PRN

## 2018-06-28 MED ORDER — MORPHINE SULFATE (PF) 2 MG/ML IV SOLN: 2.0000 mg | INTRAVENOUS | Status: DC | PRN

## 2018-06-28 MED ORDER — PSYLLIUM 28 % PO PACK: 1.0000 | PACK | Freq: Two times a day (BID) | ORAL | Status: DC

## 2018-06-28 MED ORDER — ENOXAPARIN SODIUM 40 MG/0.4ML ~~LOC~~ SOLN
40.0000 mg | SUBCUTANEOUS | Status: DC
  Administered 2018-06-28: 40 mg via SUBCUTANEOUS
  Filled 2018-06-28: qty 0.4

## 2018-06-28 MED ORDER — DOCUSATE SODIUM 100 MG PO CAPS
100.0000 mg | ORAL_CAPSULE | Freq: Two times a day (BID) | ORAL | Status: DC
  Administered 2018-06-28: 100 mg via ORAL
  Filled 2018-06-28 (×2): qty 1

## 2018-06-28 MED ORDER — ACETAMINOPHEN 325 MG PO TABS: 650.0000 mg | ORAL_TABLET | Freq: Four times a day (QID) | ORAL | Status: DC | PRN

## 2018-06-28 MED ORDER — SODIUM CHLORIDE 0.9 % IV SOLN: INTRAVENOUS | Status: DC | PRN

## 2018-06-28 NOTE — Progress Notes (Signed)
Nsg Discharge Note  Admit Date:  06/26/2018 Discharge date: 06/28/2018   [name] to be D/C'd Home per MD order.  AVS completed.  Copy for chart, and copy for patient signed, and dated. Patient/caregiver able to verbalize understanding.  Discharge Medication: Allergies as of 06/28/2018   No Known Allergies     Medication List    STOP taking these medications   magnesium hydroxide 400 MG/5ML suspension Commonly known as:  MILK OF MAGNESIA     TAKE these medications   [facility] DRY EYE RELIEF 0.2-0.2-1 % Soln Generic drug:  Glycerin-Hypromellose-PEG 400 Place 2 drops into both eyes as needed (dry eyes).   docusate sodium 100 MG capsule Commonly known as:  COLACE Take 1 capsule (100 mg total) by mouth 2 (two) times daily. What changed:  when to take this   HYDROcodone-acetaminophen 10-325 MG tablet Commonly known as:  NORCO Take 1 tablet by mouth 3 (three) times daily as needed.   lidocaine 5 % Commonly known as:  LIDODERM Place 1 patch onto the skin daily as needed (for back pain). Remove & Discard patch within 12 hours or as directed by MD   methocarbamol 500 MG tablet Commonly known as:  ROBAXIN Take 1,000 mg by mouth 2 (two) times daily.   morphine 30 MG 12 hr tablet Commonly known as:  MS CONTIN Take 30 mg by mouth every 12 (twelve) hours.   polyethylene glycol packet Commonly known as:  MIRALAX / GLYCOLAX Take 17 g by mouth daily. What changed:    when to take this  reasons to take this   PRESERVISION AREDS 2 PO Take 1 tablet by mouth at bedtime.   psyllium 28 % packet Commonly known as:  METAMUCIL Take 1 packet by mouth 2 (two) times daily.   vitamin C 1000 MG tablet Take 1,000 mg by mouth at bedtime.   VITAMIN D3 PO Take 1 tablet by mouth at bedtime.   vitamin E 400 UNIT capsule Take 400 Units by mouth at bedtime.       Discharge Assessment: Vitals:   06/27/18 2020 06/28/18 0458  BP: (!) 125/99 (!) 141/73  Pulse: 81 94  Resp: 12 12  Temp:  98.7 F (37.1 C) 98.6 F (37 C)  SpO2: 100% 96%   Skin clean, dry and intact without evidence of skin break down, no evidence of skin tears noted. IV catheter discontinued intact. Site without signs and symptoms of complications - no redness or edema noted at insertion site, patient denies c/o pain - only slight tenderness at site.  Dressing with slight pressure applied.  D/c Instructions-Education: Discharge instructions given to patient/family with verbalized understanding. D/c education completed with patient/family including follow up instructions, medication list, d/c activities limitations if indicated, with other d/c instructions as indicated by MD - patient able to verbalize understanding, all questions fully answered. Patient instructed to return to ED, call 911, or call MD for any changes in condition.  Patient escorted via [place], and D/C home via private auto.  Cristela Blue, RN 06/28/2018 3:26 PM

## 2018-06-28 NOTE — Discharge Instructions (Signed)
Bowel Obstruction  A bowel obstruction is a blockage in the small or large bowel. The bowel, which is also called the intestine, is a long, slender tube that connects the stomach to the anus. When a person eats and drinks, food and fluids go from the mouth to the stomach to the small bowel. This is where most of the nutrients in the food and fluids are absorbed. After the small bowel, material passes through the large bowel for further absorption until any leftover material leaves the body as stool through the anus during a bowel movement.  A bowel obstruction will prevent food and fluids from passing through the bowel as they normally do during digestion. The bowel can become partially or completely blocked. If this condition is not treated, it can be dangerous because the bowel could rupture.  What are the causes?  Common causes of this condition include:  · Scar tissue (adhesions) from previous surgery or treatment with high-energy X-rays (radiation).  · Recent surgery. This may cause the movements of the bowel to slow down and cause food to block the intestine.  · Inflammatory bowel disease, such asCrohn's disease or diverticulitis.  · Growths or tumors.  · A bulging organ (hernia).  · Twisting of the bowel (volvulus).  · A foreign body.  · Slipping of a part of the bowel into another part (intussusception).  What are the signs or symptoms?  Symptoms of this condition include:  · Pain in the abdomen. Depending on the degree of obstruction, pain may be:  ? Mild or severe.  ? Dull cramping or sharp pain.  ? In one area or in the entire abdomen.  · Nausea and vomiting. Vomit may be greenish or a yellow bile color.  · Bloating in the abdomen.  · Difficulty passing stool (constipation).  · Lack of passing gas.  · Frequent belching.  · Diarrhea. This may occur if the obstruction is partial and runny stool is able to leak around the obstruction.  How is this diagnosed?  This condition may be diagnosed based on:  · A  physical exam.  · Medical history.  · Imaging tests of the abdomen or pelvis, such as X-ray or CT scan.  · Blood or urine tests.  How is this treated?  Treatment for this condition depends on the cause and severity of the problem. Treatment may include:  · Fluids and pain medicines that are given through an IV. Your health care provider may instruct you not to eat or drink if you have nausea or vomiting.  · Eating a simple diet. You may be asked to consume a clear liquid diet for several days. This allows the bowel to rest.  · Placement of a small tube (nasogastric tube) into the stomach. This will relieve pain, discomfort, and nausea by removing blocked air and fluids from the stomach. It can also help the obstruction clear up faster.  · Surgery. This may be required if other treatments do not work. Surgery may be required for:  ? Bowel obstruction from a hernia. This can be an emergency procedure.  ? Scar tissue that causes frequent or severe obstructions.  Follow these instructions at home:  Medicines  · Take over-the-counter and prescription medicines only as told by your health care provider.  · If you were prescribed an antibiotic medicine, take it as told by your health care provider. Do not stop taking the antibiotic even if you start to feel better.  General instructions  ·   what activities are safe for you.  Avoid sitting for a long time without moving. Get up to take short walks every 1-2 hours. This is important to improve blood flow and breathing. Ask for help if you feel weak or unsteady.  Keep all follow-up visits as told by your health care provider. This is important. How is this prevented? After having a bowel  obstruction, you are more likely to have another. You may do the following things to prevent another obstruction:  If you have a long-term (chronic) disease, pay attention to your symptoms and contact your health care provider if you have questions or concerns.  Avoid becoming constipated. To prevent or treat constipation, your health care provider may recommend that you: ? Drink enough fluid to keep your urine pale yellow. ? Take over-the-counter or prescription medicines. ? Eat foods that are high in fiber, such as beans, whole grains, and fresh fruits and vegetables. ? Limit foods that are high in fat and processed sugars, such as fried or sweet foods.  Stay active. Exercise for 30 minutes or more, 5 or more days each week. Ask your health care provider which exercises are safe for you.  Avoid stress. Find ways to reduce stress, such as meditation, exercise, or taking time for activities that relax you.  Instead of eating three large meals each day, eat three small meals with three small snacks.  Work with a Microbiologist to make a healthy meal plan that works for you.  Do not use any products that contain nicotine or tobacco, such as cigarettes and e-cigarettes. If you need help quitting, ask your health care provider. Contact a health care provider if you:  Have a fever.  Have chills. Get help right away if you:  Have increased pain or cramping.  Vomit blood.  Have uncontrolled vomiting or nausea.  Cannot drink fluids because of vomiting or pain.  Become confused.  Begin feeling very thirsty (dehydrated).  Have severe bloating.  Feel extremely weak or you faint. Summary  A bowel obstruction is a blockage in the small or large bowel.  A bowel obstruction will prevent food and fluids from passing through the bowel as they normally do during digestion.  Treatment for this condition depends on the cause and severity of the problem. It may include fluids and pain medicines  through an IV, a simple diet, a nasogastric tube, or surgery.  Follow instructions from your health care provider about eating restrictions. You may need to avoid solid foods and consume only clear liquids until your condition improves. This information is not intended to replace advice given to you by your health care provider. Make sure you discuss any questions you have with your health care provider. Document Released: 08/26/2005 Document Revised: 10/21/2017 Document Reviewed: 10/21/2017 Elsevier Interactive Patient Education  2019 [place].    Full Liquid Diet to soft diet for the next week or more   A full liquid diet refers to fluids and foods that are liquid or will become liquid at room temperature. This diet should only be used for a short period of time to help you recover from illness or surgery. Your health care provider or dietitian will help you determine when it is safe to eat regular foods. What are tips for following this plan?     Reading food labels  Check food labels of nutrition shakes for the amount of protein. Look for nutrition shakes that have at least 8-10 grams of protein in each serving.  Look for drinks, such as milks and juices, that are "fortified" or "enriched." This means that vitamins and minerals have been added. Shopping  Buy premade nutritional shakes to keep on hand.  To vary your choices, buy different flavors of milks and shakes. Meal planning  Choose flavors and foods that you enjoy.  To make sure you get enough energy from food (calories): ? Eat 3 full liquid meals each day. Have a liquid snack between each meal. ? Drink 6-8 ounces (177-237 ml) of a nutrition supplement shake with meals or as snacks. ? Add protein powder, powdered milk, milk, or yogurt to shakes to increase the amount of protein.  Drink at least one serving a day of citrus fruit juice or fruit juice that has vitamin C added. General guidelines  Before starting the  full-liquid diet, check with your health care provider to know what foods you should avoid. These may include full-fat or high-fiber liquids.  You may have any liquid or food that becomes a liquid at room temperature. The food is considered a liquid if it can be poured off a spoon at room temperature.  Do not drink alcohol unless approved by your health care provider.  This diet gives you most of the nutrients that you need for energy, but you may not get enough of certain vitamins, minerals, and fiber. Make sure to talk to your health care provider or dietitian about: ? How many calories you need to eat get day. ? How much fluid you should have each day. ? Taking a multivitamin or a nutritional supplement. What foods are allowed? The items listed may not be a complete list. Talk with your dietitian about what dietary choices are best for you. Grains Thin hot cereal, such as cream of wheat. Soft-cooked pasta or rice pured in soup. Vegetables Pulp-free tomato or vegetable juice. Vegetables pured in soup. Fruits Fruit juice without pulp. Strained fruit pures (seeds and skins removed). Meats and other protein foods Beef, chicken, and fish broths. Powdered protein supplements. Dairy Milk and milk-based beverages, including milk shakes and instant breakfast mixes. Smooth yogurt. Pured cottage cheese. Beverages Water. Coffee and tea (caffeinated or decaffeinated). Cocoa. Liquid nutritional supplements. Soft drinks. Nondairy milks, such as almond, coconut, rice, or soy milk. Fats and oils Melted margarine and butter. Cream. Canola, almond, avocado, corn, grapeseed, sunflower, and sesame oils. Gravy. Sweets and desserts Custard. Pudding. Flavored gelatin. Smooth ice cream (without nuts or candy pieces). Sherbet. Popsicles. [place] ice. Pudding pops. Seasoning and other foods Salt and pepper. Spices. Cocoa powder. Vinegar. Ketchup. Yellow mustard. Smooth sauces, such as [place], cheese  sauce, or white sauce. Soy sauce. Cream soups. Strained soups. Syrup. Honey. Jelly (without fruit pieces). What foods are not allowed? The items listed may not be a complete list. Talk with your dietitian about what dietary choices are best for you. Grains Whole grains. Pasta. Rice. Cold cereal. Bread. Crackers. Vegetables All whole fresh, frozen, or canned vegetables. Fruits All whole fresh, frozen, or canned fruits. Meats and other protein foods All cuts of meat, poultry, and fish. Eggs. Tofu and soy protein. Nuts and nut butters. Lunch meat. Sausage. Dairy Hard cheese. Yogurt with fruit chunks. Fats and oils Coconut oil. Palm oil. Lard. Cold butter. Sweets and desserts Ice cream or other frozen desserts that have any solids in them or on top, such as nuts, chocolate chips, and pieces of cookies. Cakes. Cookies. Candy. Seasoning and other foods Stone-ground mustards. Soups with chunks or pieces. Summary  A  full liquid diet refers to fluids and foods that are liquid or will become liquid at room temperature.  This diet should only be used for a short period of time to help you recover from illness or surgery. Ask your health care provider or dietitian when it is safe for you to eat regular foods.  To make sure you get enough calories and nutrients, eat 3 meals each day with snacks between. Drink premade nutrition supplement shakes or add protein powder to homemade shakes. Take a vitamin and mineral supplement as told by your health care provider. This information is not intended to replace advice given to you by your health care provider. Make sure you discuss any questions you have with your health care provider. Document Released: 06/09/2005 Document Revised: 07/23/2016 Document Reviewed: 07/23/2016 Elsevier Interactive Patient Education  2019 [place] A soft-food eating plan includes foods that are safe and easy to chew and swallow. Your health care  provider or dietitian can help you find foods and flavors that fit into this plan. Follow this plan until your health care provider or dietitian says it is safe to start eating other foods and food textures. What are tips for following this plan? General guidelines   Take small bites of food, or cut food into pieces about  inch or smaller. Bite-sized pieces of food are easier to chew and swallow.  Eat moist foods. Avoid overly dry foods.  Avoid foods that: ? Are difficult to swallow, such as dry, chunky, crispy, or sticky foods. ? Are difficult to chew, such as hard, tough, or stringy foods. ? Contain nuts, seeds, or fruits.  Follow instructions from your dietitian about the types of liquids that are safe for you to swallow. You may be allowed to have: ? Thick liquids only. This includes only liquids that are thicker than honey. ? Thin and thick liquids. This includes all beverages and foods that become liquid at room temperature.  To make thick liquids: ? Purchase a commercial liquid thickening powder. These are available at grocery stores and pharmacies. ? Mix the thickener into liquids according to instructions on the label. ? Purchase ready-made thickened liquids. ? Thicken soup by pureeing, straining to remove chunks, and adding flour, potato flakes, or corn starch. ? Add commercial thickener to foods that become liquid at room temperature, such as milk shakes, yogurt, ice cream, gelatin, and sherbet.  Ask your health care provider whether you need to take a fiber supplement. Cooking  Cook meats so they stay tender and moist. Use methods like braising, stewing, or baking in liquid.  Cook vegetables and fruit until they are soft enough to be mashed with a fork.  Peel soft, fresh fruits such as peaches, nectarines, and melons.  When making soup, make sure chunks of meat and vegetables are smaller than  inch.  Reheat leftover foods slowly so that a tough crust does not  form. What foods are allowed? The items listed below may not be a complete list. Talk with your dietitian about what dietary choices are best for you. Grains Breads, muffins, pancakes, or waffles moistened with syrup, jelly, or butter. Dry cereals well-moistened with milk. Moist, cooked cereals. Well-cooked pasta and rice. Vegetables All soft-cooked vegetables. Shredded lettuce. Fruits All canned and cooked fruits. Soft, peeled fresh fruits. Strawberries. Dairy Milk. Cream. Yogurt. Cottage cheese. Soft cheese without the rind. Meats and other protein foods Tender, moist ground meat, poultry, or fish. Meat cooked in gravy or  sauces. Eggs. Sweets and desserts Ice cream. Milk shakes. Sherbet. Pudding. Fats and oils Butter. Margarine. Olive, canola, sunflower, and grapeseed oil. Smooth salad dressing. Smooth cream cheese. Mayonnaise. Gravy. What foods are not allowed? The items listed bemay not be a complete list. Talk with your dietitian about what dietary choices are best for you. Grains Coarse or dry cereals, such as bran, granola, and shredded wheat. Tough or chewy crusty breads, such as [place] bread or baguettes. Breads with nuts, seeds, or fruit. Vegetables All raw vegetables. Cooked corn. Cooked vegetables that are tough or stringy. Tough, crisp, fried potatoes and potato skins. Fruits Fresh fruits with skins or seeds, or both, such as apples, pears, and grapes. Stringy, high-pulp fruits, such as papaya, pineapple, coconut, and mango. Fruit leather and all dried fruit. Dairy Yogurt with nuts or coconut. Meats and other protein foods Hard, dry sausages. Dry meat, poultry, or fish. Meats with gristle. Fish with bones. Fried meat or fish. Lunch meat and hotdogs. Nuts and seeds. Chunky peanut butter or other nut butters. Sweets and desserts Cakes or cookies that are very dry or chewy. Desserts with dried fruit, nuts, or coconut. Fried pastries. Very rich pastries. Fats and oils Cream  cheese with fruit or nuts. Salad dressings with seeds or chunks. Summary  A soft-food eating plan includes foods that are safe and easy to swallow. Generally, the foods should be soft enough to be mashed with a fork.  Avoid foods that are dry, hard to chew, crunchy, sticky, stringy, or crispy.  Ask your health care provider whether you need to thicken your liquids and if you need to take a fiber supplement. This information is not intended to replace advice given to you by your health care provider. Make sure you discuss any questions you have with your health care provider. Document Released: 09/16/2007 Document Revised: 08/12/2016 Document Reviewed: 08/12/2016   Elsevier Interactive Patient Education  2019 [place]. Irregular bowel habits such as constipation and diarrhea can lead to many problems over time.  Having one soft bowel movement a day is the most important way to prevent further problems.  The anorectal canal is designed to handle stretching and feces to safely manage our ability to get rid of solid waste (feces, poop, stool) out of our body.  BUT, hard constipated stools can act like ripping concrete bricks and diarrhea can be a burning fire to this very sensitive area of our body, causing inflamed hemorrhoids, anal fissures, increasing risk is perirectal abscesses, abdominal pain/bloating, an making irritable bowel worse.     The goal: ONE SOFT BOWEL MOVEMENT A DAY!  To have soft, regular bowel movements:   Drink at least 8 tall glasses of water a day.    Take plenty of fiber.  Fiber is the undigested part of plant food that passes into the colon, acting s natures broom to encourage bowel motility and movement.  Fiber can absorb and hold large amounts of water. This results in a larger, bulkier stool, which is soft and easier to pass. Work gradually over several weeks up to 6 servings a day of fiber (25g a day even more if needed) in the form  of: o Vegetables -- Root (potatoes, carrots, turnips), leafy green (lettuce, salad greens, celery, spinach), or cooked high residue (cabbage, broccoli, etc) o Fruit -- Fresh (unpeeled skin & pulp), Dried (prunes, apricots, cherries, etc ),  or stewed ( applesauce)  o Whole grain breads, pasta, etc (whole  wheat)  o Bran cereals   Bulking Agents -- This type of water-retaining fiber generally is easily obtained each day by one of the following:  o Psyllium bran -- The psyllium plant is remarkable because its ground seeds can retain so much water. This product is available as Metamucil, Konsyl, Effersyllium, Per Diem Fiber, or the less expensive generic preparation in drug and health food stores. Although labeled a laxative, it really is not a laxative.  o Methylcellulose -- This is another fiber derived from wood which also retains water. It is available as Citrucel. o Polyethylene Glycol - and artificial fiber commonly called Miralax or Glycolax.  It is helpful for people with gassy or bloated feelings with regular fiber o Flax Seed - a less gassy fiber than psyllium  No reading or other relaxing activity while on the toilet. If bowel movements take longer than 5 minutes, you are too constipated  AVOID CONSTIPATION.  High fiber and water intake usually takes care of this.  Sometimes a laxative is needed to stimulate more frequent bowel movements, but   Laxatives are not a good long-term solution as it can wear the colon out. o Osmotics (Milk of Magnesia, Fleets phosphosoda, Magnesium citrate, MiraLax, GoLytely) are safer than  o Stimulants (Senokot, Castor Oil, Dulcolax, Ex Lax)    o Do not take laxatives for more than 7days in a row.   IF SEVERELY CONSTIPATED, try a Bowel Retraining Program: o Do not use laxatives.  o Eat a diet high in roughage, such as bran cereals and leafy vegetables.  o Drink six (6) ounces of prune or apricot juice each morning.  o Eat two (2) large servings of  stewed fruit each day.  o Take one (1) heaping tablespoon of a psyllium-based bulking agent twice a day. Use sugar-free sweetener when possible to avoid excessive calories.  o Eat a normal breakfast.  o Set aside 15 minutes after breakfast to sit on the toilet, but do not strain to have a bowel movement.  o If you do not have a bowel movement by the third day, use an enema and repeat the above steps.   Controlling diarrhea o Switch to liquids and simpler foods for a few days to avoid stressing your intestines further. o Avoid dairy products (especially milk & ice cream) for a short time.  The intestines often can lose the ability to digest lactose when stressed. o Avoid foods that cause gassiness or bloating.  Typical foods include beans and other legumes, cabbage, broccoli, and dairy foods.  Every person has some sensitivity to other foods, so listen to our body and avoid those foods that trigger problems for you. o Adding fiber (Citrucel, Metamucil, psyllium, Miralax) gradually can help thicken stools by absorbing excess fluid and retrain the intestines to act more normally.  Slowly increase the dose over a few weeks.  Too much fiber too soon can backfire and cause cramping & bloating. o Probiotics (such as active yogurt, Align, etc) may help repopulate the intestines and colon with normal bacteria and calm down a sensitive digestive tract.  Most studies show it to be of mild help, though, and such products can be costly. o Medicines: - Bismuth subsalicylate (ex. Kayopectate, Pepto Bismol) every 30 minutes for up to 6 doses can help control diarrhea.  Avoid if pregnant. - Loperamide (Immodium) can slow down diarrhea.  Start with two tablets (4mg  total) first and then try one tablet every 6 hours.  Avoid if you are  having fevers or severe pain.  If you are not better or start feeling worse, stop all medicines and call your doctor for advice o Call your doctor if you are getting worse or not better.   Sometimes further testing (cultures, endoscopy, X-ray studies, bloodwork, etc) may be needed to help diagnose and treat the cause of the diarrhea.

## 2018-06-28 NOTE — Progress Notes (Signed)
[facility] Progress Note     Subjective: CC-  States that he is feeling better than when he was admitted, but he continues to have some intermittent abdominal pain and bloating. Mild nausea at times, random and not related to PO intake. Passing flatus. He had 2 loose BMs yesterday. Tolerating full liquids but does not like how they taste; he ate pudding and ice cream and did well.  Objective: Vital signs in last 24 hours: Temp:  [98.6 F (37 C)-98.7 F (37.1 C)] 98.6 F (37 C) (01/06 0458) Pulse Rate:  [81-94] 94 (01/06 0458) Resp:  [12] 12 (01/06 0458) BP: (125-141)/(73-99) 141/73 (01/06 0458) SpO2:  [96 %-100 %] 96 % (01/06 0458) Last BM Date: 06/26/18  Intake/Output from previous day: 01/05 0701 - 01/06 0700 In: 2553.5 [I.V.:2403.5; IV Piggyback:150] Out: 2 [Urine:2] Intake/Output this shift: No intake/output data recorded.  PE: Gen:  Alert, NAD, pleasant HEENT: EOM's intact, pupils equal and round Pulm:  effort normal Abd: Soft, mild distension, +BS, nontender Psych: A&Ox3  Skin: no rashes noted, warm and dry  Lab Results:  Recent Labs    06/26/18 0717 06/27/18 0350  WBC 7.7 9.0  HGB 14.1 13.8  HCT 41.9 41.6  PLT 235 226   BMET Recent Labs    06/26/18 0717 06/27/18 0350  NA 138 139  K 4.7 4.7  CL 97* 102  CO2 32 28  GLUCOSE 158* 131*  BUN 12 11  CREATININE 0.99 0.96  CALCIUM 10.3 9.9   PT/INR No results for input(s): LABPROT, INR in the last 72 hours. CMP     Component Value Date/Time   NA 139 06/27/2018 0350   K 4.7 06/27/2018 0350   CL 102 06/27/2018 0350   CO2 28 06/27/2018 0350   GLUCOSE 131 (H) 06/27/2018 0350   BUN 11 06/27/2018 0350   CREATININE 0.96 06/27/2018 0350   CALCIUM 9.9 06/27/2018 0350   PROT 8.2 (H) 06/26/2018 0717   ALBUMIN 4.4 06/26/2018 0717   AST 53 (H) 06/26/2018 0717   ALT 124 (H) 06/26/2018 0717   ALKPHOS 95 06/26/2018 0717   BILITOT 0.3 06/26/2018 0717   GFRNONAA >60 06/27/2018 0350   GFRAA  >60 06/27/2018 0350   Lipase     Component Value Date/Time   LIPASE 21 06/26/2018 0717       Studies/Results: Dg Abd 1 View  Result Date: 06/26/2018 CLINICAL DATA:  Status post NG tube placement. EXAM: ABDOMEN - 1 VIEW COMPARISON:  None. FINDINGS: NG tube is in place with the tip in the distal descending duodenum and side-port in the distal stomach. Bowel gas pattern is nonobstructive. IMPRESSION: As above. Electronically Signed   By: Inge Rise M.D.   On: 06/26/2018 13:15   Ct Abdomen Pelvis W Contrast  Result Date: 06/26/2018 CLINICAL DATA:  Diffuse abdominal pain with nausea, vomiting and bloating. EXAM: CT ABDOMEN AND PELVIS WITH CONTRAST TECHNIQUE: Multidetector CT imaging of the abdomen and pelvis was performed using the standard protocol following bolus administration of intravenous contrast. CONTRAST:  141mL OMNIPAQUE IOHEXOL 300 MG/ML  SOLN COMPARISON:  02/10/2018, 12/29/2016 and 09/01/2016 FINDINGS: Lower chest: Lung bases are within normal. Hepatobiliary: Previous cholecystectomy. Liver and biliary tree are normal. Pancreas: Normal. Spleen: Normal. Adrenals/Urinary Tract: Adrenal glands are normal. Kidneys are normal in size without hydronephrosis or nephrolithiasis. Ureters and bladder are normal. Stomach/Bowel: Stomach is within normal. There are multiple dilated air, fluid and stool filled small bowel loops measuring up to 4.2 cm in diameter.  Similar changes were present on the previous exam. No definite transition point is identified. Evidence of patient's previous partial colectomy. Air and stool present distally within the rectosigmoid colon. No free peritoneal air. Vascular/Lymphatic: Mild calcified plaque over the abdominal aorta. No adenopathy. Reproductive: Normal. Other: Small amount of free fluid in the pelvis. Small amount of patchy free fluid in the abdomen. No focal inflammatory change. Postsurgical change over the anterior abdominal wall. Musculoskeletal: Minimal  degenerative change of the spine and hips. IMPRESSION: Evidence of previous partial colectomy. Multiple dilated small bowel loops measuring up to 4.2 cm in diameter without significant change from 02/10/2018. Minimal patchy free fluid. Findings may be due to persistent ileus or persistent partial small bowel obstructive process. Aortic Atherosclerosis (ICD10-I70.0). Electronically Signed   By: Marin Olp M.D.   On: 06/26/2018 10:47   Dg Abd Portable 1v-small Bowel Obstruction Protocol-initial, 8 Hr Delay  Result Date: 06/27/2018 CLINICAL DATA:  Follow-up examination for small bowel obstruction. EXAM: PORTABLE ABDOMEN - 1 VIEW COMPARISON:  Prior radiograph from 06/26/2018. FINDINGS: Enteric tube has been removed. Enteric contrast material seen within the stomach as well as the ascending colon and rectosigmoid colon. No appreciable small bowel dilatation. No radiographic evidence for obstruction. No free air on this supine view the abdomen. No soft tissue mass or abnormal calcification. Cholecystectomy clips noted. Visualized lung bases are clear. IMPRESSION: Nonobstructive bowel gas pattern with no radiographic evidence for small bowel obstruction. Enteric contrast material present within the stomach as well as the ascending and rectosigmoid colon. Electronically Signed   By: Jeannine Boga M.D.   On: 06/27/2018 04:18   Dg Addison Bailey G Tube Plc W/fl W/rad  Result Date: 06/26/2018 CLINICAL DATA:  NG tube placement for bowel obstruction EXAM: NASO G TUBE PLACEMENT WITH FL AND WITH RAD CONTRAST:  5 cc Isovue FLUOROSCOPY TIME:  Fluoroscopy Time:  4 minutes 12 seconds Radiation Exposure Index (if provided by the fluoroscopic device): Number of Acquired Spot Images: 1 COMPARISON:  None FINDINGS: Nasogastric tube was placed into the left nares and under fluoroscopic guidance advanced through the esophagus and into the stomach. The tip was placed in the distal stomach. Contrast injection of water-soluble contrast  material confirms intraluminal placement. IMPRESSION: 1. Fluoroscopic guidance was used to place a nasogastric tube into the stomach. Electronically Signed   By: Kerby Moors M.D.   On: 06/26/2018 13:16    Anti-infectives: Anti-infectives (From admission, onward)   None       Assessment/Plan GERD Chronic back pain  PSBO, recurrent - CT 1/4with persistent ileus vs partial SBO - xray 1/6 nonobstructive bowel gas pattern with no radiographic evidence for small bowel obstruction, contrast in colon  ID - none FEN - decrease IVF, soft diet VTE - SCDs, lovenox Foley - none  Plan - Advance to soft diet. Add miralax/colace. Ambulate. Will plan to recheck patient later today.   LOS: 2 days    Wellington Hampshire , [facility] 06/28/2018, 9:44 AM Pager: [phone] Mon 7:00 am -11:30 AM Tues-Fri 7:00 am-4:30 pm Sat-Sun 7:00 am-11:30 am

## 2018-06-28 NOTE — Care Management Note (Signed)
Case Management Note  Patient Details  Name: Kent [name] MRN: [id number] Date of Birth: 27-Apr-1941  Subjective/Objective:       SBO. Hx of multiple SBO and multiple bowel surgeries. Resides with wife. PTA  Independent with ADL's , no DME usage.    Flay Ghosh (Spouse) Calyb Mcquarrie (Son)     [phone]     PCP; Shirline Frees  Action/Plan: Transition to home when medically stable.Marland KitchenMarland KitchenNCM following for TOC needs.  States has transportation to home. Expected Discharge Date:                Expected Discharge Plan:  Home/Self Care  In-House Referral:  NA  Discharge planning Services  NA  Post Acute Care Choice:    Choice offered to:  NA  DME Arranged:  N/A DME Agency:  NA  HH Arranged:  NA HH Agency:  NA  Status of Service:  In process, will continue to follow  If discussed at Long Length of Stay Meetings, dates discussed:    Additional Comments:  Sharin Mons, RN 06/28/2018, 1:01 PM

## 2018-06-28 NOTE — Discharge Summary (Signed)
[facility] Discharge Summary   Patient ID: Kent [name] MRN: [id number] DOB/AGE: 1941/05/03 78 y.o.  Admit date: 06/26/2018 Discharge date: 06/28/2018  Admitting Diagnosis: Small bowel obstruction  Discharge Diagnosis Patient Active Problem List   Diagnosis Date Noted  . S/P exploratory laparotomy 03/10/2017  . Elevated blood pressure reading 12/30/2016  . Nonspecific abnormal electrocardiogram (ECG) (EKG) 09/01/2016  . History of colectomy 02/13/2016  . Intractable abdominal pain 02/13/2016  . S/P laparoscopic cholecystectomy 10/16/2014  . S/P hernia repair 04/13/2014  . Pre-op evaluation 03/24/2014  . H/O abdominal surgery 08/18/2013  . GERD (gastroesophageal reflux disease) 08/18/2013  . Chronic back pain 08/18/2013  . SBO (small bowel obstruction) ([place]) 08/12/2013    Consultants None  Imaging: Dg Abd Portable 1v-small Bowel Obstruction Protocol-initial, 8 Hr Delay  Result Date: 06/27/2018 CLINICAL DATA:  Follow-up examination for small bowel obstruction. EXAM: PORTABLE ABDOMEN - 1 VIEW COMPARISON:  Prior radiograph from 06/26/2018. FINDINGS: Enteric tube has been removed. Enteric contrast material seen within the stomach as well as the ascending colon and rectosigmoid colon. No appreciable small bowel dilatation. No radiographic evidence for obstruction. No free air on this supine view the abdomen. No soft tissue mass or abnormal calcification. Cholecystectomy clips noted. Visualized lung bases are clear. IMPRESSION: Nonobstructive bowel gas pattern with no radiographic evidence for small bowel obstruction. Enteric contrast material present within the stomach as well as the ascending and rectosigmoid colon. Electronically Signed   By: Jeannine Boga M.D.   On: 06/27/2018 04:18    Procedures None  Hospital Course:  JABES PRIMO is a 78yo male with prior history of multiple abdominal surgeries and recurrent SBO's, who presented to [facility] 1/4 with acute  onset abdominal pain.  Workup included CT scan which showed multiple dilated loops of small bowel consistent with ileus vs partial SBO. Patient was admitted. NG tube placed and he was started on the small bowel protocol. Follow up abdominal film revealed nonobstructive bowel gas pattern with contrast in the colon. NG tube was removed and diet advanced as tolerated.  On 1/6 the patient was having bowel function, tolerating diet, ambulating well, pain well controlled, vital signs stable and felt stable for discharge home. Discussed importance of good bowel regimen. Patient will follow up as below and knows to call with questions or concerns.     Allergies as of 06/28/2018   No Known Allergies     Medication List    STOP taking these medications   magnesium hydroxide 400 MG/5ML suspension Commonly known as:  MILK OF MAGNESIA     TAKE these medications   [facility] DRY EYE RELIEF 0.2-0.2-1 % Soln Generic drug:  Glycerin-Hypromellose-PEG 400 Place 2 drops into both eyes as needed (dry eyes).   docusate sodium 100 MG capsule Commonly known as:  COLACE Take 1 capsule (100 mg total) by mouth 2 (two) times daily. What changed:  when to take this   HYDROcodone-acetaminophen 10-325 MG tablet Commonly known as:  NORCO Take 1 tablet by mouth 3 (three) times daily as needed.   lidocaine 5 % Commonly known as:  LIDODERM Place 1 patch onto the skin daily as needed (for back pain). Remove & Discard patch within 12 hours or as directed by MD   methocarbamol 500 MG tablet Commonly known as:  ROBAXIN Take 1,000 mg by mouth 2 (two) times daily.   morphine 30 MG 12 hr tablet Commonly known as:  MS CONTIN Take 30 mg by mouth every 12 (twelve) hours.  polyethylene glycol packet Commonly known as:  MIRALAX / GLYCOLAX Take 17 g by mouth daily. What changed:    when to take this  reasons to take this   PRESERVISION AREDS 2 PO Take 1 tablet by mouth at bedtime.   psyllium 28 % packet Commonly known  as:  METAMUCIL Take 1 packet by mouth 2 (two) times daily.   vitamin C 1000 MG tablet Take 1,000 mg by mouth at bedtime.   VITAMIN D3 PO Take 1 tablet by mouth at bedtime.   vitamin E 400 UNIT capsule Take 400 Units by mouth at bedtime.        Follow-up [facility], [place]. Call.   Specialty:  General Surgery Why:  as needed Contact information: [address] [place] [phone]       Shirline Frees, MD Follow up.   Specialty:  Family Medicine Why:  CAll and follow up for medical issues Contact information: [place] [address] [phone]           Signed: Wellington Hampshire, [facility] 06/28/2018, 2:45 PM Pager: [phone] Mon 7:00 am -11:30 AM Tues-Fri 7:00 am-4:30 pm Sat-Sun 7:00 am-11:30 am

## 2019-02-24 ENCOUNTER — Emergency Department (HOSPITAL_COMMUNITY): Payer: Medicare Other

## 2019-02-24 ENCOUNTER — Inpatient Hospital Stay (HOSPITAL_COMMUNITY)
Admission: EM | Admit: 2019-02-24 | Discharge: 2019-02-26 | DRG: 390 | Disposition: A | Discharge status: Home / Self Care | Payer: Medicare Other | Attending: Internal Medicine | Admitting: Internal Medicine

## 2019-02-24 ENCOUNTER — Encounter (HOSPITAL_COMMUNITY): Payer: Self-pay | Admitting: Internal Medicine

## 2019-02-24 ENCOUNTER — Other Ambulatory Visit: Payer: Self-pay

## 2019-02-24 DIAGNOSIS — M549 Dorsalgia, unspecified: Secondary | ICD-10-CM | POA: Diagnosis present

## 2019-02-24 DIAGNOSIS — Z79899 Other long term (current) drug therapy: Secondary | ICD-10-CM

## 2019-02-24 DIAGNOSIS — K5651 Intestinal adhesions [bands], with partial obstruction: Principal | ICD-10-CM | POA: Diagnosis present

## 2019-02-24 DIAGNOSIS — K56609 Unspecified intestinal obstruction, unspecified as to partial versus complete obstruction: Secondary | ICD-10-CM

## 2019-02-24 DIAGNOSIS — Z808 Family history of malignant neoplasm of other organs or systems: Secondary | ICD-10-CM

## 2019-02-24 DIAGNOSIS — Z87891 Personal history of nicotine dependence: Secondary | ICD-10-CM

## 2019-02-24 DIAGNOSIS — Z9049 Acquired absence of other specified parts of digestive tract: Secondary | ICD-10-CM

## 2019-02-24 DIAGNOSIS — Z85828 Personal history of other malignant neoplasm of skin: Secondary | ICD-10-CM

## 2019-02-24 DIAGNOSIS — I4891 Unspecified atrial fibrillation: Secondary | ICD-10-CM

## 2019-02-24 DIAGNOSIS — Z0189 Encounter for other specified special examinations: Secondary | ICD-10-CM

## 2019-02-24 DIAGNOSIS — I499 Cardiac arrhythmia, unspecified: Secondary | ICD-10-CM | POA: Diagnosis present

## 2019-02-24 DIAGNOSIS — Z20828 Contact with and (suspected) exposure to other viral communicable diseases: Secondary | ICD-10-CM | POA: Diagnosis present

## 2019-02-24 DIAGNOSIS — Z8249 Family history of ischemic heart disease and other diseases of the circulatory system: Secondary | ICD-10-CM

## 2019-02-24 DIAGNOSIS — G8929 Other chronic pain: Secondary | ICD-10-CM | POA: Diagnosis present

## 2019-02-24 DIAGNOSIS — I480 Paroxysmal atrial fibrillation: Secondary | ICD-10-CM

## 2019-02-24 LAB — COMPREHENSIVE METABOLIC PANEL
ALT: 19 U/L (ref 0–44)
AST: 22 U/L (ref 15–41)
Albumin: 4.5 g/dL (ref 3.5–5.0)
Alkaline Phosphatase: 64 U/L (ref 38–126)
Anion gap: 12 (ref 5–15)
BUN: 13 mg/dL (ref 8–23)
CO2: 27 mmol/L (ref 22–32)
Calcium: 10 mg/dL (ref 8.9–10.3)
Chloride: 96 mmol/L — ABNORMAL LOW (ref 98–111)
Creatinine, Ser: 0.99 mg/dL (ref 0.61–1.24)
GFR calc Af Amer: >60 mL/min (ref >60)
GFR calc non Af Amer: >60 mL/min (ref >60)
Glucose, Bld: 143 mg/dL — ABNORMAL HIGH (ref 70–99)
Potassium: 3.9 mmol/L (ref 3.5–5.1)
Sodium: 135 mmol/L (ref 135–145)
Total Bilirubin: 0.6 mg/dL (ref 0.3–1.2)
Total Protein: 8.2 g/dL — ABNORMAL HIGH (ref 6.5–8.1)

## 2019-02-24 LAB — CBC
HCT: 41.8 % (ref 39.0–52.0)
Hemoglobin: 13.5 g/dL (ref 13.0–17.0)
MCH: 28.7 pg (ref 26.0–34.0)
MCHC: 32.3 g/dL (ref 30.0–36.0)
MCV: 88.9 fL (ref 80.0–100.0)
Platelets: 199 10*3/uL (ref 150–400)
RBC: 4.7 MIL/uL (ref 4.22–5.81)
RDW: 12.6 % (ref 11.5–15.5)
WBC: 6.8 10*3/uL (ref 4.0–10.5)
nRBC: 0 % (ref 0.0–0.2)

## 2019-02-24 LAB — SARS CORONAVIRUS 2 BY RT PCR (HOSPITAL ORDER, PERFORMED IN ~~LOC~~ HOSPITAL LAB): SARS Coronavirus 2: NEGATIVE

## 2019-02-24 MED ORDER — HYDROMORPHONE HCL 1 MG/ML IJ SOLN
1.0000 mg | Freq: Once | INTRAMUSCULAR | Status: AC
  Administered 2019-02-24: 11:00:00 1 mg via INTRAVENOUS
  Filled 2019-02-24: qty 1

## 2019-02-24 MED ORDER — TAMSULOSIN HCL 0.4 MG PO CAPS
0.4000 mg | ORAL_CAPSULE | Freq: Every day | ORAL | Status: DC
  Administered 2019-02-24 – 2019-02-25 (×2): 0.4 mg via ORAL
  Filled 2019-02-24 (×2): qty 1

## 2019-02-24 MED ORDER — SODIUM CHLORIDE 0.9% FLUSH
3.0000 mL | Freq: Two times a day (BID) | INTRAVENOUS | Status: DC
  Administered 2019-02-26: 3 mL via INTRAVENOUS

## 2019-02-24 MED ORDER — ONDANSETRON HCL 4 MG/2ML IJ SOLN
4.0000 mg | Freq: Once | INTRAMUSCULAR | Status: AC | PRN
  Administered 2019-02-24: 11:00:00 4 mg via INTRAVENOUS
  Filled 2019-02-24: qty 2

## 2019-02-24 MED ORDER — SODIUM CHLORIDE 0.9% FLUSH: 3.0000 mL | Freq: Once | INTRAVENOUS | Status: DC

## 2019-02-24 MED ORDER — ACETAMINOPHEN 325 MG PO TABS: 650.0000 mg | ORAL_TABLET | Freq: Four times a day (QID) | ORAL | Status: DC | PRN

## 2019-02-24 MED ORDER — POLYVINYL ALCOHOL 1.4 % OP SOLN: 2.0000 [drp] | OPHTHALMIC | Status: DC | PRN

## 2019-02-24 MED ORDER — IOPAMIDOL (ISOVUE-300) INJECTION 61%
100.0000 mL | Freq: Once | INTRAVENOUS | Status: AC | PRN
  Administered 2019-02-24: 100 mL via INTRAVENOUS

## 2019-02-24 MED ORDER — LACTATED RINGERS IV SOLN
INTRAVENOUS | Status: DC
  Administered 2019-02-24 – 2019-02-25 (×2): via INTRAVENOUS

## 2019-02-24 MED ORDER — MORPHINE SULFATE ER 15 MG PO TBCR
30.0000 mg | EXTENDED_RELEASE_TABLET | Freq: Two times a day (BID) | ORAL | Status: DC
  Administered 2019-02-24 – 2019-02-26 (×4): 30 mg via ORAL
  Filled 2019-02-24 (×4): qty 2

## 2019-02-24 MED ORDER — METHOCARBAMOL 500 MG PO TABS
1000.0000 mg | ORAL_TABLET | Freq: Two times a day (BID) | ORAL | Status: DC
  Administered 2019-02-24 – 2019-02-26 (×4): 1000 mg via ORAL
  Filled 2019-02-24 (×4): qty 2

## 2019-02-24 MED ORDER — SODIUM CHLORIDE 0.9 % IV BOLUS
1000.0000 mL | Freq: Once | INTRAVENOUS | Status: AC
  Administered 2019-02-24: 1000 mL via INTRAVENOUS

## 2019-02-24 MED ORDER — ENOXAPARIN SODIUM 40 MG/0.4ML ~~LOC~~ SOLN
40.0000 mg | SUBCUTANEOUS | Status: DC
  Administered 2019-02-25 – 2019-02-26 (×2): 40 mg via SUBCUTANEOUS
  Filled 2019-02-24 (×2): qty 0.4

## 2019-02-24 MED ORDER — ACETAMINOPHEN 650 MG RE SUPP: 650.0000 mg | Freq: Four times a day (QID) | RECTAL | Status: DC | PRN

## 2019-02-24 MED ORDER — ONDANSETRON HCL 4 MG PO TABS: 4.0000 mg | ORAL_TABLET | Freq: Four times a day (QID) | ORAL | Status: DC | PRN

## 2019-02-24 MED ORDER — ONDANSETRON HCL 4 MG/2ML IJ SOLN: 4.0000 mg | Freq: Four times a day (QID) | INTRAMUSCULAR | Status: DC | PRN

## 2019-02-24 MED ORDER — DIATRIZOATE MEGLUMINE & SODIUM 66-10 % PO SOLN
90.0000 mL | Freq: Once | ORAL | Status: AC
  Administered 2019-02-25: 90 mL via NASOGASTRIC
  Filled 2019-02-24 (×2): qty 90

## 2019-02-24 MED ORDER — DIATRIZOATE MEGLUMINE & SODIUM 66-10 % PO SOLN: 90.0000 mL | Freq: Once | ORAL | Status: DC

## 2019-02-24 MED ORDER — MORPHINE SULFATE (PF) 2 MG/ML IV SOLN: 2.0000 mg | INTRAVENOUS | Status: DC | PRN

## 2019-02-24 NOTE — ED Notes (Signed)
ED TO INPATIENT HANDOFF REPORT  ED Nurse Name and Phone #:Kim Lorrena Goranson, RN  S Name/Age/Gender Kent [name] 78 y.o. male Room/Bed: 012C/012C  Code Status   Code Status: Full Code  Home/SNF/Other Home Patient oriented to: self, place, time and situation Is this baseline? Yes   Triage Complete: Triage complete  Chief Complaint abd pain  Triage Note Pt reports having normal bowel movement last night prior to bed, having umbilical pain through the night, rating pain 9/10. Has hx of small bowel obstructions.    Allergies No Known Allergies  Level of Care/Admitting Diagnosis ED Disposition    ED Disposition Condition [facility]: [place] [[id number]]  Level of Care: Telemetry Cardiac [103]  I expect the patient will be discharged within 24 hours: No (not a candidate for 5C-Observation unit)  Covid Evaluation: Asymptomatic Screening Protocol (No Symptoms)  Diagnosis: SBO (small bowel obstruction) ([place]) [id number]  Admitting Physician: Karmen Bongo [[id number]]  Attending Physician: Karmen Bongo [[id number]]  PT Class (Do Not Modify): Observation [104]  PT Acc Code (Do Not Modify): Observation [10022]       B Medical/Surgery History Past Medical History:  Diagnosis Date  . 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 Cancer ([place])    basal cell cancer - skin  . Chronic back pain    s/p back surgery  . GERD (gastroesophageal reflux disease) 08/18/2013  . Incisional hernia, without obstruction or gangrene 02/02/2013  . Postural dizziness with presyncope 09/01/2016  . Sinus infection    currently on antibiotics  . Small bowel obstruction due to adhesions [facility])    Past Surgical History:  Procedure Laterality Date  . BACK SURGERY  08/25/2001   Neck 3 disk replacements  . BACK SURGERY     Placement/removal of nerve stimulator  . CHOLECYSTECTOMY N/A 10/16/2014   Procedure: LAPAROSCOPIC CHOLECYSTECTOMY WITH ATTEMPTED CHOLANGIOGRAM;  Surgeon: Georganna Skeans, MD;  Location: [place];  Service: General;  Laterality: N/A;  . COLON SURGERY  04/2002   Dr. Deon Pilling - Subtotal colectomy, ileostomy  . ELBOW SURGERY Right    for blood vessel & " hung" nerve  . ESOPHAGEAL DILATION    . EXPLORATORY LAPAROTOMY  03/10/2017  . HERNIA REPAIR    . Ileostomy Takedown  08/2002   Dr. Deon Pilling  . INCISION AND DRAINAGE FOOT    . INCISIONAL HERNIA REPAIR N/A 01/20/2013   Procedure: HERNIA REPAIR INCISIONAL;  Surgeon: Zenovia Jarred, MD;  Location: [place];  Service: General;  Laterality: N/A;  . [place]  04/13/2014   with mesh    dr [place] Silos  . INCISIONAL HERNIA REPAIR N/A 04/13/2014   Procedure: OPEN REPAIR INCISIONAL HERNIA ;  Surgeon: Georganna Skeans, MD;  Location: [place];  Service: General;  Laterality: N/A;  . INSERTION OF MESH N/A 04/13/2014   Procedure: INSERTION OF MESH;  Surgeon: Georganna Skeans, MD;  Location: [place];  Service: General;  Laterality: N/A;  . KNEE ARTHROSCOPY Right 2010  . LAPAROSCOPIC CHOLECYSTECTOMY  10/16/2014  . LAPAROTOMY N/A 01/20/2013   Procedure: EXPLORATORY LAPAROTOMY;  Surgeon: Zenovia Jarred, MD;  Location: [place];  Service: General;  Laterality: N/A;  . LAPAROTOMY N/A 03/10/2017   Procedure: EXPLORATORY LAPAROTOMY;  Surgeon: Georganna Skeans, MD;  Location: [place];  Service: General;  Laterality: N/A;  . Laparotomy with lysis of adhesions  2010   Dr. [place] Silos - SBO  . LYSIS OF ADHESION N/A 01/20/2013  Procedure: LYSIS OF ADHESION;  Surgeon: Zenovia Jarred, MD;  Location: [place];  Service: General;  Laterality: N/A;  . LYSIS OF ADHESION N/A 03/10/2017   Procedure: LYSIS OF ADHESION;  Surgeon: Georganna Skeans, MD;  Location: [place];  Service: General;  Laterality: N/A;  . microsurgical  03/24/2002   Lateral recess decompression L5  . SACRAL NERVE STIMULATOR PLACEMENT     inserted and removed  . SHOULDER ARTHROSCOPY Bilateral   . VASECTOMY       A IV Location/Drains/Wounds Patient Lines/Drains/Airways Status    Active Line/Drains/Airways    Name:   Placement date:   Placement time:   Site:   Days:   Peripheral IV 06/26/18 Right Antecubital   06/26/18    0829    Antecubital   243   Peripheral IV 02/24/19 Left Forearm   02/24/19    1118    Forearm   less than 1   Incision (Closed) 10/16/14 Abdomen Other (Comment)   10/16/14    0722     1592   Incision (Closed) 03/10/17 Abdomen Other (Comment)   03/10/17    0933     716   Incision - 4 Ports Abdomen 1: Umbilicus 2: Upper;Mid 3: Right;Medial 4: Right;Lateral   10/16/14    -     1592          Intake/Output Last 24 hours  Intake/Output Summary (Last 24 hours) at 02/24/2019 2245 Last data filed at 02/24/2019 1221 Gross per 24 hour  Intake 1000 ml  Output -  Net 1000 ml    Labs/Imaging Results for orders placed or performed during the hospital encounter of 02/24/19 (from the past 48 hour(s))  Comprehensive metabolic panel     Status: Abnormal   Collection Time: 02/24/19  9:38 AM  Result Value Ref Range   Sodium 135 135 - 145 mmol/L   Potassium 3.9 3.5 - 5.1 mmol/L   Chloride 96 (L) 98 - 111 mmol/L   CO2 27 22 - 32 mmol/L   Glucose, Bld 143 (H) 70 - 99 mg/dL   BUN 13 8 - 23 mg/dL   Creatinine, Ser 0.99 0.61 - 1.24 mg/dL   Calcium 10.0 8.9 - 10.3 mg/dL   Total Protein 8.2 (H) 6.5 - 8.1 g/dL   Albumin 4.5 3.5 - 5.0 g/dL   AST 22 15 - 41 U/L   ALT 19 0 - 44 U/L   Alkaline Phosphatase 64 38 - 126 U/L   Total Bilirubin 0.6 0.3 - 1.2 mg/dL   GFR calc non Af Amer >60 >60 mL/min   GFR calc Af Amer >60 >60 mL/min   Anion gap 12 5 - 15    Comment: Performed at [facility], [address] [place], [place] [address]  CBC     Status: None   Collection Time: 02/24/19  9:38 AM  Result Value Ref Range   WBC 6.8 4.0 - 10.5 K/uL   RBC 4.70 4.22 - 5.81 MIL/uL   Hemoglobin 13.5 13.0 - 17.0 g/dL   HCT 41.8 39.0 - 52.0 %   MCV 88.9 80.0 - 100.0 fL   MCH 28.7 26.0 - 34.0 pg   MCHC 32.3 30.0 - 36.0 g/dL   RDW 12.6 11.5 - 15.5 %   Platelets 199  150 - 400 K/uL   nRBC 0.0 0.0 - 0.2 %    Comment: Performed at [facility], [place] [address], [place],  [address]  SARS Coronavirus  2 [facility] order, Performed in [facility] hospital lab) Nasopharyngeal Nasopharyngeal Swab     Status: None   Collection Time: 02/24/19 11:00 AM   Specimen: Nasopharyngeal Swab  Result Value Ref Range   SARS Coronavirus 2 NEGATIVE NEGATIVE    Comment: (NOTE) If result is NEGATIVE SARS-CoV-2 target nucleic acids are NOT DETECTED. The SARS-CoV-2 RNA is generally detectable in upper and lower  respiratory specimens during the acute phase of infection. The lowest  concentration of SARS-CoV-2 viral copies this assay can detect is 250  copies / mL. A negative result does not preclude SARS-CoV-2 infection  and should not be used as the sole basis for treatment or other  patient management decisions.  A negative result may occur with  improper specimen collection / handling, submission of specimen other  than nasopharyngeal swab, presence of viral mutation(s) within the  areas targeted by this assay, and inadequate number of viral copies  (<250 copies / mL). A negative result must be combined with clinical  observations, patient history, and epidemiological information. If result is POSITIVE SARS-CoV-2 target nucleic acids are DETECTED. The SARS-CoV-2 RNA is generally detectable in upper and lower  respiratory specimens dur ing the acute phase of infection.  Positive  results are indicative of active infection with SARS-CoV-2.  Clinical  correlation with patient history and other diagnostic information is  necessary to determine patient infection status.  Positive results do  not rule out bacterial infection or co-infection with other viruses. If result is PRESUMPTIVE POSTIVE SARS-CoV-2 nucleic acids MAY BE PRESENT.   A presumptive positive result was obtained on the submitted specimen  and confirmed on repeat testing.  While 2019 novel  coronavirus  (SARS-CoV-2) nucleic acids may be present in the submitted sample  additional confirmatory testing may be necessary for epidemiological  and / or clinical management purposes  to differentiate between  SARS-CoV-2 and other Sarbecovirus currently known to infect humans.  If clinically indicated additional testing with an alternate test  methodology [phone]) is advised. The SARS-CoV-2 RNA is generally  detectable in upper and lower respiratory sp ecimens during the acute  phase of infection. The expected result is Negative. Fact Sheet for Patients:  StrictlyIdeas.no Fact Sheet for Healthcare Providers: BankingDealers.co.za This test is not yet approved or cleared by the [place] FDA and has been authorized for detection and/or diagnosis of SARS-CoV-2 by FDA under an Emergency Use Authorization (EUA).  This EUA will remain in effect (meaning this test can be used) for the duration of the COVID-19 declaration under Section 564(b)(1) of the Act, 21 U.S.C. section 360bbb-3(b)(1), unless the authorization is terminated or revoked sooner. Performed at [facility], [place] [address], [place], [place] [address]    Ct Abdomen Pelvis W Contrast  Result Date: 02/24/2019 CLINICAL DATA:  Abdominal distension. EXAM: CT ABDOMEN AND PELVIS WITH CONTRAST TECHNIQUE: Multidetector CT imaging of the abdomen and pelvis was performed using the standard protocol following bolus administration of intravenous contrast. CONTRAST:  157mL ISOVUE-300 IOPAMIDOL (ISOVUE-300) INJECTION 61% COMPARISON:  June 26, 2018 FINDINGS: Lower chest: No acute abnormality. Small hiatal hernia. Mild diffuse mucosal thickening of the visualized lower portion of the esophagus. Hepatobiliary: No focal liver abnormality is seen. Status post cholecystectomy. No biliary dilatation. Pancreas: Unremarkable. No pancreatic ductal dilatation or surrounding inflammatory changes.  Spleen: Normal in size without focal abnormality. Adrenals/Urinary Tract: Adrenal glands are unremarkable. Kidneys are normal, without renal calculi, focal lesion, or hydronephrosis. Bladder is unremarkable. Stomach/Bowel: Bowel wall distension throughout  the abdomen with gas and fluid filled small bowels measuring up to 3.9 cm. Tethering of dilated and decompressed small bowels to the anterior abdominal wall, the level of the umbilicus, where there is evidence of postsurgical scarring. Status post subtotal right colectomy. Short segment of narrowing at the enterocolonic anastomosis. The residual colon has normal appearance. Vascular/Lymphatic: Aortic atherosclerosis. No enlarged abdominal or pelvic lymph nodes. Reproductive: Prostate is unremarkable. Other: No abdominal wall hernia or abnormality. No abdominopelvic ascites. Musculoskeletal: No acute or significant osseous findings. IMPRESSION: 1. Bowel wall distension throughout the abdomen with gas and fluid filled small bowels measuring up to 3.9 cm. Tethering of dilated and decompressed small bowels to the anterior abdominal wall, at the level of the umbilicus, where there is evidence of postsurgical scarring. 2. Status post subtotal right colectomy. Short segment of narrowing at the enterocolonic anastomosis. 3. These findings may represent early or incomplete small bowel obstruction, with likely more than 1 transitional points, caused by scarring/adhesions. 4. Small hiatal hernia. Mild diffuse mucosal thickening of the visualized lower portion of the esophagus, which may represent esophagitis. Aortic Atherosclerosis (ICD10-I70.0). Electronically Signed   By: Fidela Salisbury M.D.   On: 02/24/2019 14:27    Pending Labs Unresulted Labs (From admission, onward)    Start     Ordered   02/25/19 [id number]  Basic metabolic panel  Tomorrow morning,   R     02/24/19 1601   02/25/19 0500  CBC  Tomorrow morning,   R     02/24/19 1601           Vitals/Pain Today's Vitals   02/24/19 1800 02/24/19 1900 02/24/19 2030 02/24/19 2200  BP: (!) 155/82 (!) 157/90  (!) 143/73  Pulse: 98 85  75  Resp: 18 20  17   Temp:      TempSrc:      SpO2: 97% 96%  100%  Weight:      Height:      PainSc:   0-No pain     Isolation Precautions No active isolations  Medications Medications  diatrizoate meglumine-sodium (GASTROGRAFIN) 66-10 % solution 90 mL (0 mLs Per NG tube Hold 02/24/19 1555)  morphine (MS CONTIN) 12 hr tablet 30 mg (has no administration in time range)  morphine 2 MG/ML injection 2 mg (has no administration in time range)  tamsulosin (FLOMAX) capsule 0.4 mg (has no administration in time range)  methocarbamol (ROBAXIN) tablet 1,000 mg (has no administration in time range)  polyvinyl alcohol (LIQUIFILM TEARS) 1.4 % ophthalmic solution 2 drop (has no administration in time range)  enoxaparin (LOVENOX) injection 40 mg (has no administration in time range)  acetaminophen (TYLENOL) tablet 650 mg (has no administration in time range)    Or  acetaminophen (TYLENOL) suppository 650 mg (has no administration in time range)  ondansetron (ZOFRAN) tablet 4 mg (has no administration in time range)    Or  ondansetron (ZOFRAN) injection 4 mg (has no administration in time range)  sodium chloride flush (NS) 0.9 % injection 3 mL (has no administration in time range)  lactated ringers infusion ( Intravenous New Bag/Given 02/24/19 1704)  ondansetron (ZOFRAN) injection 4 mg (4 mg Intravenous Given 02/24/19 1125)  sodium chloride 0.9 % bolus 1,000 mL (0 mLs Intravenous Stopped 02/24/19 1221)  HYDROmorphone (DILAUDID) injection 1 mg (1 mg Intravenous Given 02/24/19 1119)  iopamidol (ISOVUE-300) 61 % injection 100 mL (100 mLs Intravenous Contrast Given 02/24/19 1413)    Mobility walks Low fall risk   Focused Assessments Pt  being admitted for bowel obstruction.  Pt alert and oriented x's 4   R Recommendations: See Admitting Provider  Note  Report given to:   Additional Notes:

## 2019-02-24 NOTE — Consult Note (Signed)
Kent [name] 06-08-1941  [id number].    Requesting MD: Dr. Darl Householder Chief Complaint/Reason for Consult: SBO  HPI:  This is a pleasant 78 yo white male with an extensive past surgical history on his abdomen who is known to our service for multiple prior admissions for SBOs.  His last admission was in January of 2020 which appeared to have resolved quickly with conservative management.  He has been otherwise well since that time.  This morning around 0200am he woke up with a "sour" stomach and developed some pain and nausea.  He had a BM yesterday with no issues.  His symptoms continued and he presented to [facility].  Upon arrival he had an episode of emesis, but none since then.  He then had labs which were normal and a CT scan that shows evidence of an SBO.  Upon return to his room, the patient had a "massive BM."  He is currently feeling a little better and not having as much abdominal discomfort.  He is no longer nauseated but still states he has a little bit of a "sour" stomach.  We have been asked to see him for further recommendations.   ROS: ROS: Please see HPI, otherwise all other systems have been reviewed and are negative.  Family History  Problem Relation Age of Onset  . Heart disease Mother   . Brain cancer Father     Past Medical History:  Diagnosis Date  . 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 Cancer ([place])    basal cell cancer - skin  . Chronic back pain    s/p back surgery  . GERD (gastroesophageal reflux disease) 08/18/2013  . Incisional hernia, without obstruction or gangrene 02/02/2013  . Postural dizziness with presyncope 09/01/2016  . Sinus infection    currently on antibiotics  . Small bowel obstruction due to adhesions [facility])     Past Surgical History:  Procedure Laterality Date  . BACK SURGERY  08/25/2001   Neck 3 disk replacements  . BACK SURGERY     Placement/removal of nerve stimulator  . CHOLECYSTECTOMY N/A 10/16/2014   Procedure: LAPAROSCOPIC CHOLECYSTECTOMY  WITH ATTEMPTED CHOLANGIOGRAM;  Surgeon: Georganna Skeans, MD;  Location: [place];  Service: General;  Laterality: N/A;  . COLON SURGERY  04/2002   Dr. Deon Pilling - Subtotal colectomy, ileostomy  . ELBOW SURGERY Right    for blood vessel & " hung" nerve  . ESOPHAGEAL DILATION    . EXPLORATORY LAPAROTOMY  03/10/2017  . HERNIA REPAIR    . Ileostomy Takedown  08/2002   Dr. Deon Pilling  . INCISION AND DRAINAGE FOOT    . INCISIONAL HERNIA REPAIR N/A 01/20/2013   Procedure: HERNIA REPAIR INCISIONAL;  Surgeon: Zenovia Jarred, MD;  Location: [place];  Service: General;  Laterality: N/A;  . [place]  04/13/2014   with mesh    dr Grandville Silos  . INCISIONAL HERNIA REPAIR N/A 04/13/2014   Procedure: OPEN REPAIR INCISIONAL HERNIA ;  Surgeon: Georganna Skeans, MD;  Location: [place];  Service: General;  Laterality: N/A;  . INSERTION OF MESH N/A 04/13/2014   Procedure: INSERTION OF MESH;  Surgeon: Georganna Skeans, MD;  Location: [place];  Service: General;  Laterality: N/A;  . KNEE ARTHROSCOPY Right 2010  . LAPAROSCOPIC CHOLECYSTECTOMY  10/16/2014  . LAPAROTOMY N/A 01/20/2013   Procedure: EXPLORATORY LAPAROTOMY;  Surgeon: Zenovia Jarred, MD;  Location: [place];  Service: General;  Laterality: N/A;  .  LAPAROTOMY N/A 03/10/2017   Procedure: EXPLORATORY LAPAROTOMY;  Surgeon: Georganna Skeans, MD;  Location: [place];  Service: General;  Laterality: N/A;  . Laparotomy with lysis of adhesions  2010   Dr. Grandville Silos - SBO  . LYSIS OF ADHESION N/A 01/20/2013   Procedure: LYSIS OF ADHESION;  Surgeon: Zenovia Jarred, MD;  Location: [place];  Service: General;  Laterality: N/A;  . LYSIS OF ADHESION N/A 03/10/2017   Procedure: LYSIS OF ADHESION;  Surgeon: Georganna Skeans, MD;  Location: [place];  Service: General;  Laterality: N/A;  . microsurgical  03/24/2002   Lateral recess decompression L5  . SACRAL NERVE STIMULATOR PLACEMENT     inserted and removed  . SHOULDER ARTHROSCOPY Bilateral   . VASECTOMY      Social History:   reports that he quit smoking about 39 years ago. He quit smokeless tobacco use about 32 years ago.  His smokeless tobacco use included chew. He reports that he does not drink alcohol or use drugs.  Allergies: No Known Allergies  (Not in a hospital admission)    Physical Exam: Blood pressure 126/74, pulse (!) 108, temperature 97.6 F (36.4 C), temperature source Oral, resp. rate 12, height 5\' 11"  (1.803 m), weight 72.6 kg, SpO2 99 %. General: pleasant, WD, WN white male who is laying in bed in NAD HEENT: head is normocephalic, atraumatic.  Sclera are noninjected.  PERRL.  Ears and nose without any masses or lesions.  Mouth is pink and moist Heart: irregularly irregula.  Normal s1,s2. No obvious murmurs, gallops, or rubs noted.  Palpable radial and pedal pulses bilaterally Lungs: CTAB, no wheezes, rhonchi, or rales noted.  Respiratory effort nonlabored Abd: soft, minimally tender, minimally bloated, +BS, no masses, hernias, or organomegaly MS: all 4 extremities are symmetrical with no cyanosis, clubbing, or edema. Skin: warm and dry with no masses, lesions, or rashes Psych: A&Ox3 with an appropriate affect.   Results for orders placed or performed during the hospital encounter of 02/24/19 (from the past 48 hour(s))  Comprehensive metabolic panel     Status: Abnormal   Collection Time: 02/24/19  9:38 AM  Result Value Ref Range   Sodium 135 135 - 145 mmol/L   Potassium 3.9 3.5 - 5.1 mmol/L   Chloride 96 (L) 98 - 111 mmol/L   CO2 27 22 - 32 mmol/L   Glucose, Bld 143 (H) 70 - 99 mg/dL   BUN 13 8 - 23 mg/dL   Creatinine, Ser 0.99 0.61 - 1.24 mg/dL   Calcium 10.0 8.9 - 10.3 mg/dL   Total Protein 8.2 (H) 6.5 - 8.1 g/dL   Albumin 4.5 3.5 - 5.0 g/dL   AST 22 15 - 41 U/L   ALT 19 0 - 44 U/L   Alkaline Phosphatase 64 38 - 126 U/L   Total Bilirubin 0.6 0.3 - 1.2 mg/dL   GFR calc non Af Amer >60 >60 mL/min   GFR calc Af Amer >60 >60 mL/min   Anion gap 12 5 - 15    Comment: Performed at  [facility], [address], [place] [address]  CBC     Status: None   Collection Time: 02/24/19  9:38 AM  Result Value Ref Range   WBC 6.8 4.0 - 10.5 K/uL   RBC 4.70 4.22 - 5.81 MIL/uL   Hemoglobin 13.5 13.0 - 17.0 g/dL   HCT 41.8 39.0 - 52.0 %   MCV 88.9 80.0 - 100.0 fL   MCH 28.7  26.0 - 34.0 pg   MCHC 32.3 30.0 - 36.0 g/dL   RDW 12.6 11.5 - 15.5 %   Platelets 199 150 - 400 K/uL   nRBC 0.0 0.0 - 0.2 %    Comment: Performed at [facility], [place] [address] [place], [place] [address]  SARS Coronavirus 2 [facility] order, Performed in [facility] hospital lab) Nasopharyngeal Nasopharyngeal Swab     Status: None   Collection Time: 02/24/19 11:00 AM   Specimen: Nasopharyngeal Swab  Result Value Ref Range   SARS Coronavirus 2 NEGATIVE NEGATIVE    Comment: (NOTE) If result is NEGATIVE SARS-CoV-2 target nucleic acids are NOT DETECTED. The SARS-CoV-2 RNA is generally detectable in upper and lower  respiratory specimens during the acute phase of infection. The lowest  concentration of SARS-CoV-2 viral copies this assay can detect is 250  copies / mL. A negative result does not preclude SARS-CoV-2 infection  and should not be used as the sole basis for treatment or other  patient management decisions.  A negative result may occur with  improper specimen collection / handling, submission of specimen other  than nasopharyngeal swab, presence of viral mutation(s) within the  areas targeted by this assay, and inadequate number of viral copies  (<250 copies / mL). A negative result must be combined with clinical  observations, patient history, and epidemiological information. If result is POSITIVE SARS-CoV-2 target nucleic acids are DETECTED. The SARS-CoV-2 RNA is generally detectable in upper and lower  respiratory specimens dur ing the acute phase of infection.  Positive  results are indicative of active infection with SARS-CoV-2.  Clinical  correlation with patient  history and other diagnostic information is  necessary to determine patient infection status.  Positive results do  not rule out bacterial infection or co-infection with other viruses. If result is PRESUMPTIVE POSTIVE SARS-CoV-2 nucleic acids MAY BE PRESENT.   A presumptive positive result was obtained on the submitted specimen  and confirmed on repeat testing.  While 2019 novel coronavirus  (SARS-CoV-2) nucleic acids may be present in the submitted sample  additional confirmatory testing may be necessary for epidemiological  and / or clinical management purposes  to differentiate between  SARS-CoV-2 and other Sarbecovirus currently known to infect humans.  If clinically indicated additional testing with an alternate test  methodology [phone]) is advised. The SARS-CoV-2 RNA is generally  detectable in upper and lower respiratory sp ecimens during the acute  phase of infection. The expected result is Negative. Fact Sheet for Patients:  StrictlyIdeas.no Fact Sheet for Healthcare Providers: BankingDealers.co.za This test is not yet approved or cleared by the [place] FDA and has been authorized for detection and/or diagnosis of SARS-CoV-2 by FDA under an Emergency Use Authorization (EUA).  This EUA will remain in effect (meaning this test can be used) for the duration of the COVID-19 declaration under Section 564(b)(1) of the Act, 21 U.S.C. section 360bbb-3(b)(1), unless the authorization is terminated or revoked sooner. Performed at [facility], [place] [address], [place], [place] [address]    Ct Abdomen Pelvis W Contrast  Result Date: 02/24/2019 CLINICAL DATA:  Abdominal distension. EXAM: CT ABDOMEN AND PELVIS WITH CONTRAST TECHNIQUE: Multidetector CT imaging of the abdomen and pelvis was performed using the standard protocol following bolus administration of intravenous contrast. CONTRAST:  149mL ISOVUE-300 IOPAMIDOL (ISOVUE-300)  INJECTION 61% COMPARISON:  June 26, 2018 FINDINGS: Lower chest: No acute abnormality. Small hiatal hernia. Mild diffuse mucosal thickening of the visualized lower portion of  the esophagus. Hepatobiliary: No focal liver abnormality is seen. Status post cholecystectomy. No biliary dilatation. Pancreas: Unremarkable. No pancreatic ductal dilatation or surrounding inflammatory changes. Spleen: Normal in size without focal abnormality. Adrenals/Urinary Tract: Adrenal glands are unremarkable. Kidneys are normal, without renal calculi, focal lesion, or hydronephrosis. Bladder is unremarkable. Stomach/Bowel: Bowel wall distension throughout the abdomen with gas and fluid filled small bowels measuring up to 3.9 cm. Tethering of dilated and decompressed small bowels to the anterior abdominal wall, the level of the umbilicus, where there is evidence of postsurgical scarring. Status post subtotal right colectomy. Short segment of narrowing at the enterocolonic anastomosis. The residual colon has normal appearance. Vascular/Lymphatic: Aortic atherosclerosis. No enlarged abdominal or pelvic lymph nodes. Reproductive: Prostate is unremarkable. Other: No abdominal wall hernia or abnormality. No abdominopelvic ascites. Musculoskeletal: No acute or significant osseous findings. IMPRESSION: 1. Bowel wall distension throughout the abdomen with gas and fluid filled small bowels measuring up to 3.9 cm. Tethering of dilated and decompressed small bowels to the anterior abdominal wall, at the level of the umbilicus, where there is evidence of postsurgical scarring. 2. Status post subtotal right colectomy. Short segment of narrowing at the enterocolonic anastomosis. 3. These findings may represent early or incomplete small bowel obstruction, with likely more than 1 transitional points, caused by scarring/adhesions. 4. Small hiatal hernia. Mild diffuse mucosal thickening of the visualized lower portion of the esophagus, which may represent  esophagitis. Aortic Atherosclerosis (ICD10-I70.0). Electronically Signed   By: Fidela Salisbury M.D.   On: 02/24/2019 14:27      Assessment/Plan New onset A fib - EKG obtained while I was in the room that revealed a fib.  Monitor originally looked like some ST, but then appeared to be a fib.  Will defer to medicine for further work up.  SBO The patient appears to have an SBO on his CT scan but has since had a large BM and feels better.  Will hold on NGT placement for now, but will place if he develops N/V.  Will still proceed with SBO protocol just with no NGT.  Recheck films to assure contrast is going through and the BM wasn't just residual stool in the colon and not a sign of improvement.  We will continue to follow this patient closely with you.  FEN - NPO VTE - ok for chemical prophylaxis from our standpoint ID - none needed  Henreitta Cea, [facility] Surgery 02/24/2019, 3:06 PM Pager: [phone]

## 2019-02-24 NOTE — ED Notes (Signed)
ED Provider at bedside. 

## 2019-02-24 NOTE — H&P (Signed)
History and Physical    Kent [name] [id number] DOB: 25-Jun-1940 DOA: 02/24/2019  PCP: Shirline Frees, MD Consultants:  None Patient coming from:  Home - lives with wife and son; Donald Prose: Wife, [phone]  Chief Complaint: abdominal pain  HPI: Kent [name] is a 78 y.o. male with medical history significant of multiple prior abdominal surgeries including subtotal R colectomy and multiple SBOs as well as GERD presenting with abdominal pain.  Patient was last admitted to the surgery service from 1/4-6/20 with the same.  He started about 11pm - had a small BM and got ready to go to bed.  The pain started about 0200 and the pain became worse and was excruciating until he finally had a BM about 30 minutes.  Pain is ongoing now but significantly improved.  He had a large BM at that time.  Surgery PA was concerned about atrial fibrillation.  No chest pain.  Currently in NSR.   ED Course:  Partial SBO - surgery requests TRH admission.  Small BM yesterday, then distention and vomiting today.  Surgery has evaluated.  Review of Systems: As per HPI; otherwise review of systems reviewed and negative.   Ambulatory Status:  Ambulates without assistance  Past Medical History:  Diagnosis Date  . 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 Cancer ([place])    basal cell cancer - skin  . Chronic back pain    s/p back surgery  . GERD (gastroesophageal reflux disease) 08/18/2013  . Incisional hernia, without obstruction or gangrene 02/02/2013  . Postural dizziness with presyncope 09/01/2016  . Sinus infection    currently on antibiotics  . Small bowel obstruction due to adhesions [facility])     Past Surgical History:  Procedure Laterality Date  . BACK SURGERY  08/25/2001   Neck 3 disk replacements  . BACK SURGERY     Placement/removal of nerve stimulator  . CHOLECYSTECTOMY N/A 10/16/2014   Procedure: LAPAROSCOPIC CHOLECYSTECTOMY WITH ATTEMPTED CHOLANGIOGRAM;  Surgeon: Georganna Skeans, MD;  Location: [place];  Service:  General;  Laterality: N/A;  . COLON SURGERY  04/2002   Dr. Deon Pilling - Subtotal colectomy, ileostomy  . ELBOW SURGERY Right    for blood vessel & " hung" nerve  . ESOPHAGEAL DILATION    . EXPLORATORY LAPAROTOMY  03/10/2017  . HERNIA REPAIR    . Ileostomy Takedown  08/2002   Dr. Deon Pilling  . INCISION AND DRAINAGE FOOT    . INCISIONAL HERNIA REPAIR N/A 01/20/2013   Procedure: HERNIA REPAIR INCISIONAL;  Surgeon: Zenovia Jarred, MD;  Location: [place];  Service: General;  Laterality: N/A;  . [place]  04/13/2014   with mesh    dr Grandville Silos  . INCISIONAL HERNIA REPAIR N/A 04/13/2014   Procedure: OPEN REPAIR INCISIONAL HERNIA ;  Surgeon: Georganna Skeans, MD;  Location: [place];  Service: General;  Laterality: N/A;  . INSERTION OF MESH N/A 04/13/2014   Procedure: INSERTION OF MESH;  Surgeon: Georganna Skeans, MD;  Location: [place];  Service: General;  Laterality: N/A;  . KNEE ARTHROSCOPY Right 2010  . LAPAROSCOPIC CHOLECYSTECTOMY  10/16/2014  . LAPAROTOMY N/A 01/20/2013   Procedure: EXPLORATORY LAPAROTOMY;  Surgeon: Zenovia Jarred, MD;  Location: [place];  Service: General;  Laterality: N/A;  . LAPAROTOMY N/A 03/10/2017   Procedure: EXPLORATORY LAPAROTOMY;  Surgeon: Georganna Skeans, MD;  Location: [place];  Service: General;  Laterality: N/A;  . Laparotomy with lysis of adhesions  2010  Dr. Grandville Silos - SBO  . LYSIS OF ADHESION N/A 01/20/2013   Procedure: LYSIS OF ADHESION;  Surgeon: Zenovia Jarred, MD;  Location: [place];  Service: General;  Laterality: N/A;  . LYSIS OF ADHESION N/A 03/10/2017   Procedure: LYSIS OF ADHESION;  Surgeon: Georganna Skeans, MD;  Location: [place];  Service: General;  Laterality: N/A;  . microsurgical  03/24/2002   Lateral recess decompression L5  . SACRAL NERVE STIMULATOR PLACEMENT     inserted and removed  . SHOULDER ARTHROSCOPY Bilateral   . VASECTOMY      Social History   Socioeconomic History  . Marital status: Married    Spouse name: Not on file  . Number  of children: Not on file  . Years of education: Not on file  . Highest education level: Not on file  Occupational History  . Not on file  Social Needs  . Financial resource strain: Not on file  . Food insecurity    Worry: Not on file    Inability: Not on file  . Transportation needs    Medical: Not on file    Non-medical: Not on file  Tobacco Use  . Smoking status: Former Smoker    Quit date: 10/09/1979    Years since quitting: 39.4  . Smokeless tobacco: Former Systems developer    Types: [place] date: 03/05/1986  Substance and Sexual Activity  . Alcohol use: No  . Drug use: No  . Sexual activity: Not on file  Lifestyle  . Physical activity    Days per week: Not on file    Minutes per session: Not on file  . Stress: Not on file  Relationships  . Social Herbalist on phone: Not on file    Gets together: Not on file    Attends religious service: Not on file    Active member of club or organization: Not on file    Attends meetings of clubs or organizations: Not on file    Relationship status: Not on file  . Intimate partner violence    Fear of current or ex partner: Not on file    Emotionally abused: Not on file    Physically abused: Not on file    Forced sexual activity: Not on file  Other Topics Concern  . Not on file  Social History Narrative   Married for 29 years, 3 granddaughters    No Known Allergies  Family History  Problem Relation Age of Onset  . Heart disease Mother   . Brain cancer Father     Prior to Admission medications   Medication Sig Start Date End Date Taking? Authorizing Provider  Ascorbic Acid (VITAMIN C) 1000 MG tablet Take 1,000 mg by mouth at bedtime.    Yes [provider]  Cholecalciferol (VITAMIN D3 PO) Take 1 tablet by mouth at bedtime.    Yes [provider]  Glycerin-Hypromellose-PEG 400 ([facility] DRY EYE RELIEF) 0.2-0.2-1 % SOLN Place 2 drops into both eyes as needed (dry eyes).   Yes [provider]   HYDROcodone-acetaminophen (NORCO) 10-325 MG tablet Take 1 tablet by mouth 3 (three) times daily as needed. 03/21/18  Yes [provider]  methocarbamol (ROBAXIN) 500 MG tablet Take 1,000 mg by mouth 2 (two) times daily. 03/21/18  Yes [provider]  morphine (MS CONTIN) 30 MG 12 hr tablet Take 30 mg by mouth every 12 (twelve) hours. 01/20/18  Yes [provider]  Multiple Vitamins-Minerals (  PRESERVISION AREDS 2 PO) Take 1 tablet by mouth at bedtime.    Yes [provider]  polyethylene glycol (MIRALAX / GLYCOLAX) packet Take 17 g by mouth daily. Patient taking differently: Take 17 g by mouth at bedtime.  02/19/18  Yes Regalado, Belkys A, MD  tamsulosin (FLOMAX) 0.4 MG CAPS capsule Take 0.4 mg by mouth at bedtime.   Yes [provider]  vitamin E 400 UNIT capsule Take 400 Units by mouth at bedtime.    Yes [provider]  zinc gluconate 50 MG tablet Take 50 mg by mouth daily.   Yes [provider]  docusate sodium (COLACE) 100 MG capsule Take 1 capsule (100 mg total) by mouth 2 (two) times daily. Patient not taking: Reported on 02/24/2019 02/18/18   Regalado, Jerald Kief A, MD  psyllium (METAMUCIL) 28 % packet Take 1 packet by mouth 2 (two) times daily. Patient not taking: Reported on 02/24/2019 06/28/18   Wellington Hampshire, PA-C    Physical Exam: Vitals:   02/24/19 1332 02/24/19 1440 02/24/19 1530 02/24/19 1545  BP: (!) 188/95 126/74 140/68 (!) 144/68  Pulse: 100 (!) 108 81 80  Resp: 14 12 17 17   Temp:      TempSrc:      SpO2: 99% 99% 99% 100%  Weight:      Height:         . General:  Appears calm and comfortable and is NAD . Eyes:   EOMI, normal lids, iris . ENT:  grossly normal hearing, lips & tongue, mmm; appropriate dentition . Neck:  no LAD, masses or thyromegaly . Cardiovascular:  RRR, no m/r/g. No LE edema.  Marland Kitchen Respiratory:   CTA bilaterally with no wheezes/rales/rhonchi.  Normal respiratory effort. . Abdomen:  soft, NT, ND, NABS  . Skin:  no rash or induration seen on limited exam . Musculoskeletal:  grossly normal tone BUE/BLE, good ROM, no bony abnormality . Psychiatric:  grossly normal mood and affect, speech fluent and appropriate, AOx3 . Neurologic:  CN 2-12 grossly intact, moves all extremities in coordinated fashion, sensation intact    Radiological Exams on Admission: Ct Abdomen Pelvis W Contrast  Result Date: 02/24/2019 CLINICAL DATA:  Abdominal distension. EXAM: CT ABDOMEN AND PELVIS WITH CONTRAST TECHNIQUE: Multidetector CT imaging of the abdomen and pelvis was performed using the standard protocol following bolus administration of intravenous contrast. CONTRAST:  153mL ISOVUE-300 IOPAMIDOL (ISOVUE-300) INJECTION 61% COMPARISON:  June 26, 2018 FINDINGS: Lower chest: No acute abnormality. Small hiatal hernia. Mild diffuse mucosal thickening of the visualized lower portion of the esophagus. Hepatobiliary: No focal liver abnormality is seen. Status post cholecystectomy. No biliary dilatation. Pancreas: Unremarkable. No pancreatic ductal dilatation or surrounding inflammatory changes. Spleen: Normal in size without focal abnormality. Adrenals/Urinary Tract: Adrenal glands are unremarkable. Kidneys are normal, without renal calculi, focal lesion, or hydronephrosis. Bladder is unremarkable. Stomach/Bowel: Bowel wall distension throughout the abdomen with gas and fluid filled small bowels measuring up to 3.9 cm. Tethering of dilated and decompressed small bowels to the anterior abdominal wall, the level of the umbilicus, where there is evidence of postsurgical scarring. Status post subtotal right colectomy. Short segment of narrowing at the enterocolonic anastomosis. The residual colon has normal appearance. Vascular/Lymphatic: Aortic atherosclerosis. No enlarged abdominal or pelvic lymph nodes. Reproductive: Prostate is unremarkable. Other: No abdominal wall hernia or abnormality. No abdominopelvic ascites. Musculoskeletal:  No acute or significant osseous findings. IMPRESSION: 1. Bowel wall distension throughout the abdomen with gas and fluid filled small bowels measuring  up to 3.9 cm. Tethering of dilated and decompressed small bowels to the anterior abdominal wall, at the level of the umbilicus, where there is evidence of postsurgical scarring. 2. Status post subtotal right colectomy. Short segment of narrowing at the enterocolonic anastomosis. 3. These findings may represent early or incomplete small bowel obstruction, with likely more than 1 transitional points, caused by scarring/adhesions. 4. Small hiatal hernia. Mild diffuse mucosal thickening of the visualized lower portion of the esophagus, which may represent esophagitis. Aortic Atherosclerosis (ICD10-I70.0). Electronically Signed   By: Fidela Salisbury M.D.   On: 02/24/2019 14:27    EKG:  Afib, rate 121; nonspecific ST changes  Labs on Admission: I have personally reviewed the available labs and imaging studies at the time of the admission.  Pertinent labs:   Glucose 143 Normal CBC COVID negative   Assessment/Plan Principal Problem:   SBO (small bowel obstruction) (HCC) Active Problems:   Chronic back pain   Dysrhythmia, cardiac   SBO -Patient with prior h/o abdominal surgeries and multiple SBOs presenting with acute onset of abdominal pain with abdominal distention and CT findings c/w recurrent SBO -Will place in observation status on Med Surg -Gen Surg consulted by ER; currently no indication for surgical intervention -NPO for bowel rest -NG tube not currently needed, as patient had a large BM in the ER and may have relieved the obstruction -IVF hydration -Pain control with morphine  Dysrhythmia -Patient with an episode of afib that was appreciate by surgical PA and captured on EKG in the ER -He had already spontaneously converted to NSR by the time of my evaluation a short time later -Will monitor on tele for now but with such a  transient episode it would not appear to be necessary to add rate controlling agents/AC at this time  Chronic back pain -Continue home MS Contin -Cover with prn morphine for additional pain relief -Long-term, it may be reasonable to try to taper off opiates, if possible, since this can slow GI transit and may increase the risk of constipation and possibly obstruction -I have reviewed this patient in the  Controlled Substances Reporting System.  He is generally receiving medications from only one provider and appears to be taking them as prescribed. -He is at increased risk of opioid misuse, diversion, or overdose.    Note: This patient has been tested and is negative for the novel coronavirus COVID-19.  DVT prophylaxis:  Lovenox  Code Status:  Full - confirmed with patient/family Family Communication: Wife was present throughout evaluation Disposition Plan:  Home once clinically improved Consults called: Surgery  Admission status: It is my clinical opinion that referral for OBSERVATION is reasonable and necessary in this patient based on the above information provided. The aforementioned taken together are felt to place the patient at high risk for further clinical deterioration. However it is anticipated that the patient may be medically stable for discharge from the hospital within 24 to 48 hours.    Karmen Bongo MD [facility]   How to contact the [facility] Attending or Consulting provider [place] or covering provider during after hours [place], for this patient?  1. Check the care team in [facility] and look for a) attending/consulting [facility] provider listed and b) the [facility] team listed 2. Log into [email or web address] and use [place]'s universal password to access. If you do not have the password, please contact the hospital operator. 3. Locate the [facility] provider you are looking for under [facility] and page to  a number that you can be directly reached. 4. If you still have difficulty  reaching the provider, please page the [facility] (Director on Call) for the Hospitalists listed on amion for assistance.   02/24/2019, 4:12 PM

## 2019-02-24 NOTE — ED Triage Notes (Signed)
Pt reports having normal bowel movement last night prior to bed, having umbilical pain through the night, rating pain 9/10. Has hx of small bowel obstructions.

## 2019-02-24 NOTE — ED Provider Notes (Signed)
[facility] EMERGENCY DEPARTMENT Provider Note   CSN: [id number] Arrival date & time: 02/24/19  [id number]     History   Chief Complaint Chief Complaint  Patient presents with  . Abdominal Pain    HPI TYRAE SIEMION is a 78 y.o. male with PMHx of chronic back pain, GERD and multiple SBOs presenting with sudden onset epigastric abdominal pain as of 2am last night with nausea. He states that this feels like previous SBOs. No vomiting or obstipation. Last BM was yesterday and patient has been able to pass flatus. Patient denies any hematochezia or melena, fevers/chills, chest pain or shortness of breath.    Past Medical History:  Diagnosis Date  . 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 Cancer ([place])    basal cell cancer - skin  . Chronic back pain    s/p back surgery  . GERD (gastroesophageal reflux disease)   . GERD (gastroesophageal reflux disease) 08/18/2013  . Incisional hernia   . Incisional hernia, without obstruction or gangrene 02/02/2013  . Postural dizziness with presyncope 09/01/2016  . Sinus infection    currently on antibiotics  . Small bowel obstruction due to adhesions ([place])   . Small bowel obstruction due to adhesions [facility])     Patient Active Problem List   Diagnosis Date Noted  . S/P exploratory laparotomy 03/10/2017  . Elevated blood pressure reading 12/30/2016  . Nonspecific abnormal electrocardiogram (ECG) (EKG) 09/01/2016  . History of colectomy 02/13/2016  . Intractable abdominal pain 02/13/2016  . S/P laparoscopic cholecystectomy 10/16/2014  . S/P hernia repair 04/13/2014  . Pre-op evaluation 03/24/2014  . H/O abdominal surgery 08/18/2013  . GERD (gastroesophageal reflux disease) 08/18/2013  . Chronic back pain 08/18/2013  . SBO (small bowel obstruction) ([place]) 08/12/2013    Past Surgical History:  Procedure Laterality Date  . BACK SURGERY  08/25/2001   Neck 3 disk replacements  . BACK SURGERY     Placement/removal of nerve stimulator  .  CHOLECYSTECTOMY N/A 10/16/2014   Procedure: LAPAROSCOPIC CHOLECYSTECTOMY WITH ATTEMPTED CHOLANGIOGRAM;  Surgeon: Georganna Skeans, MD;  Location: [place];  Service: General;  Laterality: N/A;  . COLON SURGERY  04/2002   Dr. Deon Pilling - Subtotal colectomy, ileostomy  . ELBOW SURGERY Right    for blood vessel & " hung" nerve  . ESOPHAGEAL DILATION    . EXPLORATORY LAPAROTOMY  03/10/2017  . HERNIA REPAIR    . Ileostomy Takedown  08/2002   Dr. Deon Pilling  . INCISION AND DRAINAGE FOOT    . INCISIONAL HERNIA REPAIR N/A 01/20/2013   Procedure: HERNIA REPAIR INCISIONAL;  Surgeon: Zenovia Jarred, MD;  Location: [place];  Service: General;  Laterality: N/A;  . [place]  04/13/2014   with mesh    dr Grandville Silos  . INCISIONAL HERNIA REPAIR N/A 04/13/2014   Procedure: OPEN REPAIR INCISIONAL HERNIA ;  Surgeon: Georganna Skeans, MD;  Location: [place];  Service: General;  Laterality: N/A;  . INSERTION OF MESH N/A 04/13/2014   Procedure: INSERTION OF MESH;  Surgeon: Georganna Skeans, MD;  Location: [place];  Service: General;  Laterality: N/A;  . KNEE ARTHROSCOPY Right 2010  . LAPAROSCOPIC CHOLECYSTECTOMY  10/16/2014  . LAPAROTOMY N/A 01/20/2013   Procedure: EXPLORATORY LAPAROTOMY;  Surgeon: Zenovia Jarred, MD;  Location: [place];  Service: General;  Laterality: N/A;  . LAPAROTOMY N/A 03/10/2017   Procedure: EXPLORATORY LAPAROTOMY;  Surgeon: Georganna Skeans, MD;  Location: [place];  Service:  General;  Laterality: N/A;  . Laparotomy with lysis of adhesions  2010   Dr. Grandville Silos - SBO  . LYSIS OF ADHESION N/A 01/20/2013   Procedure: LYSIS OF ADHESION;  Surgeon: Zenovia Jarred, MD;  Location: [place];  Service: General;  Laterality: N/A;  . LYSIS OF ADHESION N/A 03/10/2017   Procedure: LYSIS OF ADHESION;  Surgeon: Georganna Skeans, MD;  Location: [place];  Service: General;  Laterality: N/A;  . microsurgical  03/24/2002   Lateral recess decompression L5  . SACRAL NERVE STIMULATOR PLACEMENT     inserted and removed  .  SHOULDER ARTHROSCOPY Bilateral   . VASECTOMY          Home Medications    Prior to Admission medications   Medication Sig Start Date End Date Taking? Authorizing Provider  Ascorbic Acid (VITAMIN C) 1000 MG tablet Take 1,000 mg by mouth at bedtime.    Yes [provider]  Cholecalciferol (VITAMIN D3 PO) Take 1 tablet by mouth at bedtime.    Yes [provider]  Glycerin-Hypromellose-PEG 400 ([facility] DRY EYE RELIEF) 0.2-0.2-1 % SOLN Place 2 drops into both eyes as needed (dry eyes).   Yes [provider]  HYDROcodone-acetaminophen (NORCO) 10-325 MG tablet Take 1 tablet by mouth 3 (three) times daily as needed. 03/21/18  Yes [provider]  methocarbamol (ROBAXIN) 500 MG tablet Take 1,000 mg by mouth 2 (two) times daily. 03/21/18  Yes [provider]  morphine (MS CONTIN) 30 MG 12 hr tablet Take 30 mg by mouth every 12 (twelve) hours. 01/20/18  Yes [provider]  Multiple Vitamins-Minerals (PRESERVISION AREDS 2 PO) Take 1 tablet by mouth at bedtime.    Yes [provider]  polyethylene glycol (MIRALAX / GLYCOLAX) packet Take 17 g by mouth daily. Patient taking differently: Take 17 g by mouth at bedtime.  02/19/18  Yes Regalado, Belkys A, MD  tamsulosin (FLOMAX) 0.4 MG CAPS capsule Take 0.4 mg by mouth at bedtime.   Yes [provider]  vitamin E 400 UNIT capsule Take 400 Units by mouth at bedtime.    Yes [provider]  zinc gluconate 50 MG tablet Take 50 mg by mouth daily.   Yes [provider]  docusate sodium (COLACE) 100 MG capsule Take 1 capsule (100 mg total) by mouth 2 (two) times daily. Patient not taking: Reported on 02/24/2019 02/18/18   Regalado, Jerald Kief A, MD  psyllium (METAMUCIL) 28 % packet Take 1 packet by mouth 2 (two) times daily. Patient not taking: Reported on 02/24/2019 06/28/18   Wellington Hampshire, PA-C    Family History Family History  Problem Relation Age of Onset  . Heart disease Mother    . Brain cancer Father     Social History Social History   Tobacco Use  . Smoking status: Former Smoker    Quit date: 10/09/1979    Years since quitting: 39.4  . Smokeless tobacco: Former Systems developer    Types: Chew    Quit date: 03/05/1986  Substance Use Topics  . Alcohol use: No  . Drug use: No     Allergies   Patient has no known allergies.   Review of Systems Review of Systems  Constitutional: Negative for chills, fever and malaise/fatigue.  Respiratory: Negative for cough, shortness of breath and wheezing.   Cardiovascular: Negative for chest pain, palpitations and leg swelling.  Gastrointestinal: Positive for abdominal pain and nausea. Negative for blood in stool, constipation, diarrhea, melena and vomiting.  Genitourinary:  Negative.   Musculoskeletal: Positive for back pain.       Chronic back pain  Neurological: Negative.   Psychiatric/Behavioral: Negative.   All other systems reviewed and are negative.   Physical Exam Updated Vital Signs BP (!) 175/92   Pulse 94   Temp 97.6 F (36.4 C) (Oral)   Resp 14   Ht 5\' 11"  (1.803 m)   Wt 72.6 kg   SpO2 95%   BMI 22.32 kg/m   Physical Exam Constitutional:      General: He is in acute distress.     Appearance: He is well-developed. He is not diaphoretic.  HENT:     Head: Normocephalic and atraumatic.  Cardiovascular:     Rate and Rhythm: Normal rate and regular rhythm.     Heart sounds: Normal heart sounds.  Pulmonary:     Effort: Pulmonary effort is normal. No respiratory distress.     Breath sounds: Normal breath sounds.  Abdominal:     General: Abdomen is flat. Bowel sounds are increased. There is no distension.     Palpations: Abdomen is soft.     Tenderness: There is abdominal tenderness in the epigastric area. There is guarding. There is no rebound.  Skin:    General: Skin is warm and dry.  Neurological:     General: No focal deficit present.     Mental Status: He is alert and oriented to person, place,  and time.  Psychiatric:        Mood and Affect: Mood normal.        Behavior: Behavior normal.     ED Treatments / Results  Labs (all labs ordered are listed, but only abnormal results are displayed) Labs Reviewed  COMPREHENSIVE METABOLIC PANEL - Abnormal; Notable for the following components:      Result Value   Chloride 96 (*)    Glucose, Bld 143 (*)    Total Protein 8.2 (*)    All other components within normal limits  SARS CORONAVIRUS 2 (HOSPITAL ORDER, [facility])  CBC    EKG None  Radiology No results found.  Procedures Procedures (including critical care time)  Medications Ordered in ED Medications  sodium chloride flush (NS) 0.9 % injection 3 mL (3 mLs Intravenous Not Given 02/24/19 1119)  ondansetron (ZOFRAN) injection 4 mg (4 mg Intravenous Given 02/24/19 1125)  sodium chloride 0.9 % bolus 1,000 mL (0 mLs Intravenous Stopped 02/24/19 1221)  HYDROmorphone (DILAUDID) injection 1 mg (1 mg Intravenous Given 02/24/19 1119)    Initial Impression / Assessment and Plan / ED Course  I have reviewed the triage vital signs and the nursing notes.  Pertinent labs & imaging results that were available during my care of the patient were reviewed by me and considered in my medical decision making (see chart for details).  Patient is a 78yo male with PMHx of multiple SBOs presenting with sudden onset abdominal pain since 2am with significant nausea and had one episode of NBNB emesis in the ED. On examination, patient has hyperactive bowel sounds and tenderness to palpation and guarding of the epigastric region. No obvious distension noted.  CT findings consistent with early/incomplete small bowel obstruction with likely >1 transitional points.   Consult called to surgery and recommended for admission to hospitalist service. Patient will be admitted to hospitalist service as he also developed recent A fib with RVR as surgery was evaluated him. Patient also had  large BM while in ED.  NPO, IVF,admit to hospitalist service.   Final Clinical Impressions(s) / ED Diagnoses   Final diagnoses:  None    ED Discharge Orders    None       Harvie Heck, MD 02/24/19 1505    Drenda Freeze, MD 02/26/19 [phone]

## 2019-02-25 ENCOUNTER — Observation Stay (HOSPITAL_COMMUNITY): Payer: Medicare Other

## 2019-02-25 DIAGNOSIS — K5651 Intestinal adhesions [bands], with partial obstruction: Secondary | ICD-10-CM | POA: Diagnosis present

## 2019-02-25 DIAGNOSIS — Z79899 Other long term (current) drug therapy: Secondary | ICD-10-CM | POA: Diagnosis not present

## 2019-02-25 DIAGNOSIS — I361 Nonrheumatic tricuspid (valve) insufficiency: Secondary | ICD-10-CM | POA: Diagnosis not present

## 2019-02-25 DIAGNOSIS — G8929 Other chronic pain: Secondary | ICD-10-CM | POA: Diagnosis present

## 2019-02-25 DIAGNOSIS — M549 Dorsalgia, unspecified: Secondary | ICD-10-CM | POA: Diagnosis present

## 2019-02-25 DIAGNOSIS — I4891 Unspecified atrial fibrillation: Secondary | ICD-10-CM | POA: Diagnosis not present

## 2019-02-25 DIAGNOSIS — I480 Paroxysmal atrial fibrillation: Secondary | ICD-10-CM | POA: Diagnosis present

## 2019-02-25 DIAGNOSIS — Z85828 Personal history of other malignant neoplasm of skin: Secondary | ICD-10-CM | POA: Diagnosis not present

## 2019-02-25 DIAGNOSIS — K56609 Unspecified intestinal obstruction, unspecified as to partial versus complete obstruction: Secondary | ICD-10-CM | POA: Diagnosis present

## 2019-02-25 DIAGNOSIS — Z9049 Acquired absence of other specified parts of digestive tract: Secondary | ICD-10-CM | POA: Diagnosis not present

## 2019-02-25 DIAGNOSIS — Z8249 Family history of ischemic heart disease and other diseases of the circulatory system: Secondary | ICD-10-CM | POA: Diagnosis not present

## 2019-02-25 DIAGNOSIS — Z87891 Personal history of nicotine dependence: Secondary | ICD-10-CM | POA: Diagnosis not present

## 2019-02-25 DIAGNOSIS — Z808 Family history of malignant neoplasm of other organs or systems: Secondary | ICD-10-CM | POA: Diagnosis not present

## 2019-02-25 DIAGNOSIS — Z20828 Contact with and (suspected) exposure to other viral communicable diseases: Secondary | ICD-10-CM | POA: Diagnosis present

## 2019-02-25 LAB — CBC
HCT: 36.4 % — ABNORMAL LOW (ref 39.0–52.0)
Hemoglobin: 11.8 g/dL — ABNORMAL LOW (ref 13.0–17.0)
MCH: 29.1 pg (ref 26.0–34.0)
MCHC: 32.4 g/dL (ref 30.0–36.0)
MCV: 89.9 fL (ref 80.0–100.0)
Platelets: 182 10*3/uL (ref 150–400)
RBC: 4.05 MIL/uL — ABNORMAL LOW (ref 4.22–5.81)
RDW: 13 % (ref 11.5–15.5)
WBC: 5.4 10*3/uL (ref 4.0–10.5)
nRBC: 0 % (ref 0.0–0.2)

## 2019-02-25 LAB — BASIC METABOLIC PANEL
Anion gap: 9 (ref 5–15)
BUN: 11 mg/dL (ref 8–23)
CO2: 24 mmol/L (ref 22–32)
Calcium: 9.3 mg/dL (ref 8.9–10.3)
Chloride: 105 mmol/L (ref 98–111)
Creatinine, Ser: 0.93 mg/dL (ref 0.61–1.24)
GFR calc Af Amer: >60 mL/min (ref >60)
GFR calc non Af Amer: >60 mL/min (ref >60)
Glucose, Bld: 105 mg/dL — ABNORMAL HIGH (ref 70–99)
Potassium: 3.9 mmol/L (ref 3.5–5.1)
Sodium: 138 mmol/L (ref 135–145)

## 2019-02-25 LAB — TSH: TSH: 0.378 u[IU]/mL (ref 0.350–4.500)

## 2019-02-25 LAB — ECHOCARDIOGRAM COMPLETE
Height: 71 in
Weight: 2472 oz

## 2019-02-25 MED ORDER — LACTATED RINGERS IV SOLN
INTRAVENOUS | Status: AC
  Administered 2019-02-25: 20:00:00 via INTRAVENOUS

## 2019-02-25 MED ORDER — LACTATED RINGERS IV SOLN
INTRAVENOUS | Status: AC
  Administered 2019-02-25: 13:00:00 via INTRAVENOUS

## 2019-02-25 NOTE — Progress Notes (Signed)
  Echocardiogram 2D Echocardiogram has been performed.  Kent [name] 02/25/2019, 2:04 PM

## 2019-02-25 NOTE — Progress Notes (Addendum)
Pt drank the gastrografin this morning at 0345. Scheduled for xray 8hr (1145) after administering dose as ordered.  Denies any abd pain N/V at this time. Pt is resting in the bed.  Will continue to monitor.   Fransico Michael, RN

## 2019-02-25 NOTE — Progress Notes (Signed)
[facility] informed that patient has new onset of Afib and that patient is NSL and wants to have fluids running due to dehydration issues. Arthor Captain LPN

## 2019-02-25 NOTE — Progress Notes (Signed)
Pt has arrived to 3E16 from ED. Pt is alert & oriented x4. VS stable in flowsheet. Denies N/V or abd pain. Pt is on tele box 67 CCMD notified. Also has been oriented to unit and room. Call light within reach. Will continue to monitor.   Fransico Michael, RN

## 2019-02-25 NOTE — Progress Notes (Addendum)
PROGRESS NOTE    Kent [name]  [id number] DOB: [birth year] DOA: 02/24/2019 PCP: Shirline Frees, MD  Brief Narrative: Kent [name] is a 78 y.o. male with medical history significant of multiple prior abdominal surgeries including subtotal R colectomy and multiple SBOs as well as GERD presented with abdominal pain.  Patient was last admitted to the surgery service from 1/4-6/20 with the same.  He started about 11pm - had a small BM and got ready to go to bed.  The pain started about 0200 and the pain became worse and was excruciating until he finally had a BM about 30 minutes.  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 -Clinically improving with conservative management -General surgery following -was NPO, Starting clear liquids today, continue IV fluids today -Had BM yesterday and last night  Transient atrial fibrillation -This was noted yesterday, remains in sinus rhythm today -Check 2D echocardiogram and TSH -If atrial fibrillation recurs will need to consider anticoagulation -Monitor on telemetry  Chronic back pain -On MS Contin at baseline, this was continued -Needs to be on a bowel regimen as slow GI transit from narcotics could worsen above problem  DVT prophylaxis: Lovenox Code Status: Full code Family Communication: No family at bedside Disposition Plan: Home when improved, tolerating diet, possibly tomorrow  Consultants:   General surgery   Procedures:   Antimicrobials:    Subjective: -Feels better today, denies any nausea vomiting, denies chest pain shortness of breath  Objective: Vitals:   02/24/19 2326 02/25/19 0354 02/25/19 0535 02/25/19 0750  BP: 106/76 (!) 143/74 (!) 128/54 132/72  Pulse: 95 77 78 70  Resp: 18 18 18 18   Temp: 98.5 F (36.9 C) 98 F (36.7 C) 98.2 F  (36.8 C) 98.5 F (36.9 C)  TempSrc: Oral Oral Oral Oral  SpO2: 98% 98% 97% 100%  Weight: 72.1 kg  70.1 kg   Height: 5\' 11"  (1.803 m)       Intake/Output Summary (Last 24 hours) at 02/25/2019 1117 Last data filed at 02/25/2019 0300 Gross per 24 hour  Intake 1750 ml  Output 403 ml  Net 1347 ml   Filed Weights   02/24/19 0933 02/24/19 2326 02/25/19 0535  Weight: 72.6 kg 72.1 kg 70.1 kg    Examination:  General exam: Appears calm and comfortable no distress Respiratory system: Clear bilaterally Cardiovascular system: S1 & S2 heard, RRR.  Gastrointestinal system: Abdomen is nondistended, soft and nontender.Normal bowel sounds heard. Central nervous system: Alert and oriented. No focal neurological deficits. Extremities: No edema Skin: No rashes, lesions or ulcers Psychiatry: Judgement and insight appear normal. Mood & affect appropriate.     Data Reviewed:   CBC: Recent Labs  Lab 02/24/19 0938 02/25/19 0528  WBC 6.8 5.4  HGB 13.5 11.8*  HCT 41.8 36.4*  MCV 88.9 89.9  PLT 199 [id number]   Basic Metabolic Panel: Recent Labs  Lab 02/24/19 0938 02/25/19 0528  NA 135 138  K 3.9 3.9  CL 96* 105  CO2 27 24  GLUCOSE 143* 105*  BUN 13 11  CREATININE 0.99 0.93  CALCIUM 10.0 9.3   GFR: Estimated Creatinine Clearance: 67 mL/min (by C-G formula based on SCr of 0.93 mg/dL). Liver Function Tests: Recent Labs  Lab 02/24/19 0938  AST 22  ALT 19  ALKPHOS 64  BILITOT 0.6  PROT 8.2*  ALBUMIN 4.5   No results for input(s): LIPASE, AMYLASE in the last 168 hours. No results for input(s): AMMONIA in the last 168 hours. Coagulation Profile: No results for input(s): INR, PROTIME in the last 168 hours. Cardiac Enzymes: No results for input(s): CKTOTAL, CKMB, CKMBINDEX, TROPONINI in the last 168 hours. BNP (last 3 results) No results for input(s): PROBNP in the last 8760 hours. HbA1C: No results for input(s): HGBA1C in the last 72 hours. CBG: No results for input(s): GLUCAP  in the last 168 hours. Lipid Profile: No results for input(s): CHOL, HDL, LDLCALC, TRIG, CHOLHDL, LDLDIRECT in the last 72 hours. Thyroid Function Tests: No results for input(s): TSH, T4TOTAL, FREET4, T3FREE, THYROIDAB in the last 72 hours. Anemia Panel: No results for input(s): VITAMINB12, FOLATE, FERRITIN, TIBC, IRON, RETICCTPCT in the last 72 hours. Urine analysis:    Component Value Date/Time   COLORURINE YELLOW 02/10/2018 2016   APPEARANCEUR CLEAR 02/10/2018 2016   LABSPEC 1.008 02/10/2018 2016   PHURINE 5.0 02/10/2018 2016   GLUCOSEU NEGATIVE 02/10/2018 2016   HGBUR NEGATIVE 02/10/2018 2016   BILIRUBINUR NEGATIVE 02/10/2018 2016   KETONESUR NEGATIVE 02/10/2018 2016   PROTEINUR NEGATIVE 02/10/2018 2016   UROBILINOGEN 0.2 10/04/2014 2223   NITRITE NEGATIVE 02/10/2018 2016   LEUKOCYTESUR NEGATIVE 02/10/2018 2016   Sepsis Labs: @LABRCNTIP (procalcitonin:4,lacticidven:4)  ) Recent Results (from the past 240 hour(s))  SARS Coronavirus 2 [facility] order, Performed in [facility] hospital lab) Nasopharyngeal Nasopharyngeal Swab     Status: None   Collection Time: 02/24/19 11:00 AM   Specimen: Nasopharyngeal Swab  Result Value Ref Range Status   SARS Coronavirus 2 NEGATIVE NEGATIVE Final    Comment: (NOTE) If result is NEGATIVE SARS-CoV-2 target nucleic acids are NOT DETECTED. The SARS-CoV-2 RNA is generally detectable in upper and lower  respiratory specimens during the acute phase of infection. The lowest  concentration of SARS-CoV-2 viral copies this assay can detect is 250  copies / mL. A negative result does not preclude SARS-CoV-2 infection  and should not be used as the sole basis for treatment or other  patient management decisions.  A negative result may occur with  improper specimen collection / handling, submission of specimen other  than nasopharyngeal swab, presence of viral mutation(s) within the  areas targeted by this assay, and inadequate number of viral  copies  (<250 copies / mL). A negative result must be combined with clinical  observations, patient history, and epidemiological information. If result is POSITIVE SARS-CoV-2 target nucleic acids are DETECTED. The SARS-CoV-2 RNA is generally detectable in upper and lower  respiratory specimens dur ing the acute phase of infection.  Positive  results are indicative of active infection with SARS-CoV-2.  Clinical  correlation with patient history and other diagnostic information is  necessary to determine patient infection status.  Positive results do  not rule out bacterial infection or co-infection with other viruses. If result is PRESUMPTIVE POSTIVE SARS-CoV-2 nucleic acids MAY BE PRESENT.   A presumptive positive result was obtained on the submitted specimen  and confirmed on repeat testing.  While 2019 novel coronavirus  (SARS-CoV-2) nucleic acids may be present in the submitted sample  additional confirmatory testing may be necessary for epidemiological  and / or clinical management purposes  to differentiate between  SARS-CoV-2 and other Sarbecovirus currently known to infect humans.  If clinically indicated additional testing with an alternate test  methodology [phone]) is advised. The SARS-CoV-2 RNA is generally  detectable in upper and lower respiratory sp ecimens  during the acute  phase of infection. The expected result is Negative. Fact Sheet for Patients:  StrictlyIdeas.no Fact Sheet for Healthcare Providers: BankingDealers.co.za This test is not yet approved or cleared by the [place] FDA and has been authorized for detection and/or diagnosis of SARS-CoV-2 by FDA under an Emergency Use Authorization (EUA).  This EUA will remain in effect (meaning this test can be used) for the duration of the COVID-19 declaration under Section 564(b)(1) of the Act, 21 U.S.C. section 360bbb-3(b)(1), unless the authorization is terminated  or revoked sooner. Performed at [facility], [place] [address] [place], [place] [address]          Radiology Studies: Ct Abdomen Pelvis W Contrast  Result Date: 02/24/2019 CLINICAL DATA:  Abdominal distension. EXAM: CT ABDOMEN AND PELVIS WITH CONTRAST TECHNIQUE: Multidetector CT imaging of the abdomen and pelvis was performed using the standard protocol following bolus administration of intravenous contrast. CONTRAST:  126mL ISOVUE-300 IOPAMIDOL (ISOVUE-300) INJECTION 61% COMPARISON:  June 26, 2018 FINDINGS: Lower chest: No acute abnormality. Small hiatal hernia. Mild diffuse mucosal thickening of the visualized lower portion of the esophagus. Hepatobiliary: No focal liver abnormality is seen. Status post cholecystectomy. No biliary dilatation. Pancreas: Unremarkable. No pancreatic ductal dilatation or surrounding inflammatory changes. Spleen: Normal in size without focal abnormality. Adrenals/Urinary Tract: Adrenal glands are unremarkable. Kidneys are normal, without renal calculi, focal lesion, or hydronephrosis. Bladder is unremarkable. Stomach/Bowel: Bowel wall distension throughout the abdomen with gas and fluid filled small bowels measuring up to 3.9 cm. Tethering of dilated and decompressed small bowels to the anterior abdominal wall, the level of the umbilicus, where there is evidence of postsurgical scarring. Status post subtotal right colectomy. Short segment of narrowing at the enterocolonic anastomosis. The residual colon has normal appearance. Vascular/Lymphatic: Aortic atherosclerosis. No enlarged abdominal or pelvic lymph nodes. Reproductive: Prostate is unremarkable. Other: No abdominal wall hernia or abnormality. No abdominopelvic ascites. Musculoskeletal: No acute or significant osseous findings. IMPRESSION: 1. Bowel wall distension throughout the abdomen with gas and fluid filled small bowels measuring up to 3.9 cm. Tethering of dilated and decompressed small bowels to the  anterior abdominal wall, at the level of the umbilicus, where there is evidence of postsurgical scarring. 2. Status post subtotal right colectomy. Short segment of narrowing at the enterocolonic anastomosis. 3. These findings may represent early or incomplete small bowel obstruction, with likely more than 1 transitional points, caused by scarring/adhesions. 4. Small hiatal hernia. Mild diffuse mucosal thickening of the visualized lower portion of the esophagus, which may represent esophagitis. Aortic Atherosclerosis (ICD10-I70.0). Electronically Signed   By: Fidela Salisbury M.D.   On: 02/24/2019 14:27        Scheduled Meds:  enoxaparin (LOVENOX) injection  40 mg Subcutaneous Q24H   methocarbamol  1,000 mg Oral BID   morphine  30 mg Oral Q12H   sodium chloride flush  3 mL Intravenous Q12H   tamsulosin  0.4 mg Oral QHS   Continuous Infusions:  lactated ringers 75 mL/hr at 02/25/19 0554     LOS: 0 days    Time spent: 52min    Domenic Polite, MD [facility]  02/25/2019, 11:17 AM

## 2019-02-25 NOTE — Progress Notes (Signed)
[facility] Progress Note     Subjective: CC: sbo Patient denies abdominal pain. Having bowel function and feeling much better. Denies palpitations or chest pain. Denies SOB. Would like to lie flat in the bed so he can sleep.   Objective: Vital signs in last 24 hours: Temp:  [98 F (36.7 C)-98.5 F (36.9 C)] 98.5 F (36.9 C) (09/04 0750) Pulse Rate:  [70-108] 70 (09/04 0750) Resp:  [12-20] 18 (09/04 0750) BP: (106-188)/(54-95) 132/72 (09/04 0750) SpO2:  [92 %-100 %] 100 % (09/04 0750) Weight:  [70.1 kg-72.1 kg] 70.1 kg (09/04 0535) Last BM Date: 02/25/19  Intake/Output from previous day: 09/03 0701 - 09/04 0700 In: 1750 [I.V.:750; IV Piggyback:1000] Out: 403 [Urine:400; Stool:3] Intake/Output this shift: No intake/output data recorded.  PE: Gen:  Alert, NAD, pleasant Card:  Irregularly irregular, pedal pulses 2+ BL Pulm:  Normal effort, clear to auscultation bilaterally Abd: Soft, non-tender, non-distended, +BS Skin: warm and dry, no rashes  Psych: A&Ox3   Lab Results:  Recent Labs    02/24/19 0938 02/25/19 0528  WBC 6.8 5.4  HGB 13.5 11.8*  HCT 41.8 36.4*  PLT 199 182   BMET Recent Labs    02/24/19 0938 02/25/19 0528  NA 135 138  K 3.9 3.9  CL 96* 105  CO2 27 24  GLUCOSE 143* 105*  BUN 13 11  CREATININE 0.99 0.93  CALCIUM 10.0 9.3   PT/INR No results for input(s): LABPROT, INR in the last 72 hours. CMP     Component Value Date/Time   NA 138 02/25/2019 0528   K 3.9 02/25/2019 0528   CL 105 02/25/2019 0528   CO2 24 02/25/2019 0528   GLUCOSE 105 (H) 02/25/2019 0528   BUN 11 02/25/2019 0528   CREATININE 0.93 02/25/2019 0528   CALCIUM 9.3 02/25/2019 0528   PROT 8.2 (H) 02/24/2019 0938   ALBUMIN 4.5 02/24/2019 0938   AST 22 02/24/2019 0938   ALT 19 02/24/2019 0938   ALKPHOS 64 02/24/2019 0938   BILITOT 0.6 02/24/2019 0938   GFRNONAA >60 02/25/2019 0528   GFRAA >60 02/25/2019 0528   Lipase     Component Value Date/Time    LIPASE 21 06/26/2018 0717       Studies/Results: Ct Abdomen Pelvis W Contrast  Result Date: 02/24/2019 CLINICAL DATA:  Abdominal distension. EXAM: CT ABDOMEN AND PELVIS WITH CONTRAST TECHNIQUE: Multidetector CT imaging of the abdomen and pelvis was performed using the standard protocol following bolus administration of intravenous contrast. CONTRAST:  166mL ISOVUE-300 IOPAMIDOL (ISOVUE-300) INJECTION 61% COMPARISON:  June 26, 2018 FINDINGS: Lower chest: No acute abnormality. Small hiatal hernia. Mild diffuse mucosal thickening of the visualized lower portion of the esophagus. Hepatobiliary: No focal liver abnormality is seen. Status post cholecystectomy. No biliary dilatation. Pancreas: Unremarkable. No pancreatic ductal dilatation or surrounding inflammatory changes. Spleen: Normal in size without focal abnormality. Adrenals/Urinary Tract: Adrenal glands are unremarkable. Kidneys are normal, without renal calculi, focal lesion, or hydronephrosis. Bladder is unremarkable. Stomach/Bowel: Bowel wall distension throughout the abdomen with gas and fluid filled small bowels measuring up to 3.9 cm. Tethering of dilated and decompressed small bowels to the anterior abdominal wall, the level of the umbilicus, where there is evidence of postsurgical scarring. Status post subtotal right colectomy. Short segment of narrowing at the enterocolonic anastomosis. The residual colon has normal appearance. Vascular/Lymphatic: Aortic atherosclerosis. No enlarged abdominal or pelvic lymph nodes. Reproductive: Prostate is unremarkable. Other: No abdominal wall hernia or abnormality. No abdominopelvic ascites. Musculoskeletal: No  acute or significant osseous findings. IMPRESSION: 1. Bowel wall distension throughout the abdomen with gas and fluid filled small bowels measuring up to 3.9 cm. Tethering of dilated and decompressed small bowels to the anterior abdominal wall, at the level of the umbilicus, where there is evidence of  postsurgical scarring. 2. Status post subtotal right colectomy. Short segment of narrowing at the enterocolonic anastomosis. 3. These findings may represent early or incomplete small bowel obstruction, with likely more than 1 transitional points, caused by scarring/adhesions. 4. Small hiatal hernia. Mild diffuse mucosal thickening of the visualized lower portion of the esophagus, which may represent esophagitis. Aortic Atherosclerosis (ICD10-I70.0). Electronically Signed   By: Fidela Salisbury M.D.   On: 02/24/2019 14:27    Anti-infectives: Anti-infectives (From admission, onward)   None       Assessment/Plan New onset A fib - per primary service  SBO - patient drank PO gastrografin around 0345 this AM - film around 1100 today - denies abdominal pain, nausea - having bowel function - ok to have liquids  FEN - FLD, IVF VTE - lovenox ID - none needed  LOS: 0 days    Brigid Re , [facility] Surgery 02/25/2019, 9:43 AM Pager: [phone] Consults: [phone] 7:00 AM - 4:30 PM M, W-F 7:00 AM - 11:30 AM Tues, Sat, Sun

## 2019-02-26 DIAGNOSIS — I480 Paroxysmal atrial fibrillation: Secondary | ICD-10-CM | POA: Diagnosis not present

## 2019-02-26 DIAGNOSIS — I4891 Unspecified atrial fibrillation: Secondary | ICD-10-CM | POA: Diagnosis not present

## 2019-02-26 DIAGNOSIS — M549 Dorsalgia, unspecified: Secondary | ICD-10-CM | POA: Diagnosis not present

## 2019-02-26 DIAGNOSIS — K56609 Unspecified intestinal obstruction, unspecified as to partial versus complete obstruction: Secondary | ICD-10-CM | POA: Diagnosis not present

## 2019-02-26 DIAGNOSIS — G8929 Other chronic pain: Secondary | ICD-10-CM

## 2019-02-26 LAB — CBC
HCT: 35.1 % — ABNORMAL LOW (ref 39.0–52.0)
Hemoglobin: 11.4 g/dL — ABNORMAL LOW (ref 13.0–17.0)
MCH: 29.2 pg (ref 26.0–34.0)
MCHC: 32.5 g/dL (ref 30.0–36.0)
MCV: 89.8 fL (ref 80.0–100.0)
Platelets: 146 10*3/uL — ABNORMAL LOW (ref 150–400)
RBC: 3.91 MIL/uL — ABNORMAL LOW (ref 4.22–5.81)
RDW: 12.8 % (ref 11.5–15.5)
WBC: 3.5 10*3/uL — ABNORMAL LOW (ref 4.0–10.5)
nRBC: 0 % (ref 0.0–0.2)

## 2019-02-26 LAB — BASIC METABOLIC PANEL
Anion gap: 10 (ref 5–15)
BUN: 10 mg/dL (ref 8–23)
CO2: 26 mmol/L (ref 22–32)
Calcium: 9.2 mg/dL (ref 8.9–10.3)
Chloride: 103 mmol/L (ref 98–111)
Creatinine, Ser: 0.91 mg/dL (ref 0.61–1.24)
GFR calc Af Amer: >60 mL/min (ref >60)
GFR calc non Af Amer: >60 mL/min (ref >60)
Glucose, Bld: 103 mg/dL — ABNORMAL HIGH (ref 70–99)
Potassium: 3.3 mmol/L — ABNORMAL LOW (ref 3.5–5.1)
Sodium: 139 mmol/L (ref 135–145)

## 2019-02-26 MED ORDER — BISACODYL 10 MG RE SUPP: 10.0000 mg | Freq: Every day | RECTAL | 0 refills | Status: DC | PRN

## 2019-02-26 MED ORDER — POLYETHYLENE GLYCOL 3350 17 G PO PACK: 17.0000 g | PACK | Freq: Two times a day (BID) | ORAL | 0 refills | Status: DC

## 2019-02-26 MED ORDER — FLEET ENEMA 7-19 GM/118ML RE ENEM: 1.0000 | ENEMA | Freq: Every day | RECTAL | 0 refills | Status: DC | PRN

## 2019-02-26 MED ORDER — METOPROLOL TARTRATE 25 MG PO TABS: 25.0000 mg | ORAL_TABLET | Freq: Two times a day (BID) | ORAL | 0 refills | Status: DC

## 2019-02-26 MED ORDER — BISACODYL 10 MG RE SUPP: 10.0000 mg | Freq: Every day | RECTAL | Status: DC | PRN

## 2019-02-26 MED ORDER — SENNOSIDES-DOCUSATE SODIUM 8.6-50 MG PO TABS: 2.0000 | ORAL_TABLET | Freq: Every day | ORAL | 1 refills | Status: AC | PRN

## 2019-02-26 MED ORDER — ASPIRIN EC 81 MG PO TBEC: 81.0000 mg | DELAYED_RELEASE_TABLET | Freq: Every day | ORAL | 2 refills | Status: AC

## 2019-02-26 MED ORDER — METOPROLOL TARTRATE 25 MG PO TABS
25.0000 mg | ORAL_TABLET | Freq: Two times a day (BID) | ORAL | Status: DC
  Administered 2019-02-26: 25 mg via ORAL
  Filled 2019-02-26: qty 1

## 2019-02-26 NOTE — Progress Notes (Addendum)
Vital signs before and after ambulation   02/26/19 0819 02/26/19 0827  Vitals  BP (!) 120/58 128/62  MAP (mmHg) 76 78  BP Location Left Arm Right Arm  BP Method Automatic Automatic  Patient Position (if appropriate) Sitting Standing  Pulse Rate 76 80  Pulse Rate Source Monitor  --   Oxygen Therapy  SpO2 100 % 100 %  O2 Device Room Air Room Air   Pt has no dizziness, SOB while ambulating this time. He states he has symptoms occasionally, and it goes on and off.

## 2019-02-26 NOTE — Discharge Instructions (Signed)
Metoprolol is a medication for BP and heart rate. Please see your PCP in 1 wk and have your BP and heart rate checked.    You were cared for by a hospitalist during your hospital stay. If you have any questions about your discharge medications or the care you received while you were in the hospital after you are discharged, you can call the unit and asked to speak with the hospitalist on call if the hospitalist that took care of you is not available. Once you are discharged, your primary care physician will handle any further medical issues.   Please note that NO REFILLS for any discharge medications will be authorized once you are discharged, as it is imperative that you return to your primary care physician (or establish a relationship with a primary care physician if you do not have one) for your aftercare needs so that they can reassess your need for medications and monitor your lab values.  Please take all your medications with you for your next visit with your Primary MD. Please ask your Primary MD to get all Hospital records sent to his/her office. Please request your Primary MD to go over all hospital test results at the follow up.   If you experience worsening of your admission symptoms, develop shortness of breath, chest pain, suicidal or homicidal thoughts or a life threatening emergency, you must seek medical attention immediately by calling 911 or calling your MD.   Dennis Bast must read the complete instructions/literature along with all the possible adverse reactions/side effects for all the medicines you take including new medications that have been prescribed to you. Take new medicines after you have completely understood and accpet all the possible adverse reactions/side effects.    Do not drive when taking pain medications or sedatives.     Do not take more than prescribed Pain, Sleep and Anxiety Medications   If you have smoked or chewed Tobacco in the last 2 yrs please stop. Stop any  regular alcohol  and or recreational drug use.   Wear Seat belts while driving.

## 2019-02-26 NOTE — Consult Note (Signed)
CARDIOLOGY CONSULT NOTE       Patient ID: COSIMO DOCTOR MRN: [id number] DOB/AGE: Mar 25, 1941 78 y.o.  Admit date: 02/24/2019 Referring Physician: Jacinta Shoe Primary Physician: Shirline Frees, MD Primary Cardiologist: None Reason for Consultation: PAF  Principal Problem:   SBO (small bowel obstruction) ([place]) Active Problems:   Chronic back pain   Dysrhythmia, cardiac   HPI:  78 y.o. no history of heart issues Multiple previous issues with SBO and abdominal surgeries. Admitted with partial SBO that seems to be resolving with conservative RX  He had brief episode of PAF. Not really symptomatic did note some palpitations. Currently feels great and taking PO.    This patients CHA2DS2-VASc Score and unadjusted Ischemic Stroke Rate (% per year) is equal to 2.2 % stroke rate/year from a score of 2  Above score calculated as 1 point each if present [CHF, HTN, DM, Vascular=MI/PAD/Aortic Plaque, Age if 65-74, or Male] Above score calculated as 2 points each if present [Age > 75, or Stroke/TIA/TE]   ROS All other systems reviewed and negative except as noted above  Past Medical History:  Diagnosis Date  . 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 Cancer ([place])    basal cell cancer - skin  . Chronic back pain    s/p back surgery  . GERD (gastroesophageal reflux disease) 08/18/2013  . Incisional hernia, without obstruction or gangrene 02/02/2013  . Postural dizziness with presyncope 09/01/2016  . Sinus infection    currently on antibiotics  . Small bowel obstruction due to adhesions [facility])     Family History  Problem Relation Age of Onset  . Heart disease Mother   . Brain cancer Father     Social History   Socioeconomic History  . Marital status: Married    Spouse name: Not on file  . Number of children: Not on file  . Years of education: Not on file  . Highest education level: Not on file  Occupational History  . Not on file  Social Needs  . Financial resource strain: Not on file   . Food insecurity    Worry: Not on file    Inability: Not on file  . Transportation needs    Medical: Not on file    Non-medical: Not on file  Tobacco Use  . Smoking status: Former Smoker    Quit date: 10/09/1979    Years since quitting: 39.4  . Smokeless tobacco: Former Systems developer    Types: Mondamin date: 03/05/1986  Substance and Sexual Activity  . Alcohol use: No  . Drug use: No  . Sexual activity: Not on file  Lifestyle  . Physical activity    Days per week: Not on file    Minutes per session: Not on file  . Stress: Not on file  Relationships  . Social Herbalist on phone: Not on file    Gets together: Not on file    Attends religious service: Not on file    Active member of club or organization: Not on file    Attends meetings of clubs or organizations: Not on file    Relationship status: Not on file  . Intimate partner violence    Fear of current or ex partner: Not on file    Emotionally abused: Not on file    Physically abused: Not on file    Forced sexual activity: Not on file  Other Topics Concern  . Not  on file  Social History Narrative   Married for 39 years, 3 granddaughters    Past Surgical History:  Procedure Laterality Date  . BACK SURGERY  08/25/2001   Neck 3 disk replacements  . BACK SURGERY     Placement/removal of nerve stimulator  . CHOLECYSTECTOMY N/A 10/16/2014   Procedure: LAPAROSCOPIC CHOLECYSTECTOMY WITH ATTEMPTED CHOLANGIOGRAM;  Surgeon: Georganna Skeans, MD;  Location: [place];  Service: General;  Laterality: N/A;  . COLON SURGERY  04/2002   Dr. Deon Pilling - Subtotal colectomy, ileostomy  . ELBOW SURGERY Right    for blood vessel & " hung" nerve  . ESOPHAGEAL DILATION    . EXPLORATORY LAPAROTOMY  03/10/2017  . HERNIA REPAIR    . Ileostomy Takedown  08/2002   Dr. Deon Pilling  . INCISION AND DRAINAGE FOOT    . INCISIONAL HERNIA REPAIR N/A 01/20/2013   Procedure: HERNIA REPAIR INCISIONAL;  Surgeon: Zenovia Jarred, MD;  Location: [place];   Service: General;  Laterality: N/A;  . [place]  04/13/2014   with mesh    dr Grandville Silos  . INCISIONAL HERNIA REPAIR N/A 04/13/2014   Procedure: OPEN REPAIR INCISIONAL HERNIA ;  Surgeon: Georganna Skeans, MD;  Location: [place];  Service: General;  Laterality: N/A;  . INSERTION OF MESH N/A 04/13/2014   Procedure: INSERTION OF MESH;  Surgeon: Georganna Skeans, MD;  Location: [place];  Service: General;  Laterality: N/A;  . KNEE ARTHROSCOPY Right 2010  . LAPAROSCOPIC CHOLECYSTECTOMY  10/16/2014  . LAPAROTOMY N/A 01/20/2013   Procedure: EXPLORATORY LAPAROTOMY;  Surgeon: Zenovia Jarred, MD;  Location: [place];  Service: General;  Laterality: N/A;  . LAPAROTOMY N/A 03/10/2017   Procedure: EXPLORATORY LAPAROTOMY;  Surgeon: Georganna Skeans, MD;  Location: [place];  Service: General;  Laterality: N/A;  . Laparotomy with lysis of adhesions  2010   Dr. Grandville Silos - SBO  . LYSIS OF ADHESION N/A 01/20/2013   Procedure: LYSIS OF ADHESION;  Surgeon: Zenovia Jarred, MD;  Location: [place];  Service: General;  Laterality: N/A;  . LYSIS OF ADHESION N/A 03/10/2017   Procedure: LYSIS OF ADHESION;  Surgeon: Georganna Skeans, MD;  Location: [place];  Service: General;  Laterality: N/A;  . microsurgical  03/24/2002   Lateral recess decompression L5  . SACRAL NERVE STIMULATOR PLACEMENT     inserted and removed  . SHOULDER ARTHROSCOPY Bilateral   . VASECTOMY       . enoxaparin (LOVENOX) injection  40 mg Subcutaneous Q24H  . methocarbamol  1,000 mg Oral BID  . morphine  30 mg Oral Q12H  . sodium chloride flush  3 mL Intravenous Q12H  . tamsulosin  0.4 mg Oral QHS     Physical Exam: Blood pressure 128/62, pulse 80, temperature 97.9 F (36.6 C), temperature source Oral, resp. rate 16, height 5\' 11"  (1.803 m), weight 69.9 kg, SpO2 100 %.   Affect appropriate Healthy:  appears stated age 71: normal Neck supple with no adenopathy JVP normal no bruits no thyromegaly Lungs clear with no wheezing and good  diaphragmatic motion Heart:  S1/S2 no murmur, no rub, gallop or click PMI normal Abdomen: BS positive previous surgeries  Distal pulses intact with no bruits No edema Neuro non-focal Skin warm and dry No muscular weakness   Labs:   Lab Results  Component Value Date   WBC 3.5 (L) 02/26/2019   HGB 11.4 (L) 02/26/2019   HCT 35.1 (L) 02/26/2019   MCV 89.8 02/26/2019   PLT 146 (  L) 02/26/2019    Recent Labs  Lab 02/24/19 0938  02/26/19 0651  NA 135   < > 139  K 3.9   < > 3.3*  CL 96*   < > 103  CO2 27   < > 26  BUN 13   < > 10  CREATININE 0.99   < > 0.91  CALCIUM 10.0   < > 9.2  PROT 8.2*  --   --   BILITOT 0.6  --   --   ALKPHOS 64  --   --   ALT 19  --   --   AST 22  --   --   GLUCOSE 143*   < > 103*   < > = values in this interval not displayed.   Lab Results  Component Value Date   TROPONINI <0.03 09/02/2016   No results found for: CHOL No results found for: HDL No results found for: LDLCALC No results found for: TRIG No results found for: CHOLHDL No results found for: LDLDIRECT    Radiology: Ct Abdomen Pelvis W Contrast  Result Date: 02/24/2019 CLINICAL DATA:  Abdominal distension. EXAM: CT ABDOMEN AND PELVIS WITH CONTRAST TECHNIQUE: Multidetector CT imaging of the abdomen and pelvis was performed using the standard protocol following bolus administration of intravenous contrast. CONTRAST:  180mL ISOVUE-300 IOPAMIDOL (ISOVUE-300) INJECTION 61% COMPARISON:  June 26, 2018 FINDINGS: Lower chest: No acute abnormality. Small hiatal hernia. Mild diffuse mucosal thickening of the visualized lower portion of the esophagus. Hepatobiliary: No focal liver abnormality is seen. Status post cholecystectomy. No biliary dilatation. Pancreas: Unremarkable. No pancreatic ductal dilatation or surrounding inflammatory changes. Spleen: Normal in size without focal abnormality. Adrenals/Urinary Tract: Adrenal glands are unremarkable. Kidneys are normal, without renal calculi, focal  lesion, or hydronephrosis. Bladder is unremarkable. Stomach/Bowel: Bowel wall distension throughout the abdomen with gas and fluid filled small bowels measuring up to 3.9 cm. Tethering of dilated and decompressed small bowels to the anterior abdominal wall, the level of the umbilicus, where there is evidence of postsurgical scarring. Status post subtotal right colectomy. Short segment of narrowing at the enterocolonic anastomosis. The residual colon has normal appearance. Vascular/Lymphatic: Aortic atherosclerosis. No enlarged abdominal or pelvic lymph nodes. Reproductive: Prostate is unremarkable. Other: No abdominal wall hernia or abnormality. No abdominopelvic ascites. Musculoskeletal: No acute or significant osseous findings. IMPRESSION: 1. Bowel wall distension throughout the abdomen with gas and fluid filled small bowels measuring up to 3.9 cm. Tethering of dilated and decompressed small bowels to the anterior abdominal wall, at the level of the umbilicus, where there is evidence of postsurgical scarring. 2. Status post subtotal right colectomy. Short segment of narrowing at the enterocolonic anastomosis. 3. These findings may represent early or incomplete small bowel obstruction, with likely more than 1 transitional points, caused by scarring/adhesions. 4. Small hiatal hernia. Mild diffuse mucosal thickening of the visualized lower portion of the esophagus, which may represent esophagitis. Aortic Atherosclerosis (ICD10-I70.0). Electronically Signed   By: Fidela Salisbury M.D.   On: 02/24/2019 14:27   Dg Abd Portable 1v-small Bowel Obstruction Protocol-initial, 8 Hr Delay  Result Date: 02/25/2019 CLINICAL DATA:  Small-bowel obstruction. 8 hour delayed image following oral contrast administration. EXAM: PORTABLE ABDOMEN - 1 VIEW COMPARISON:  06/27/2018 and abdomen and pelvis CT dated 02/24/2019. FINDINGS: Normal bowel gas pattern. Oral contrast in the right colon, rectum and distal sigmoid colon.  Cholecystectomy clips. Mild left hip degenerative changes. IMPRESSION: No acute abnormality.  The oral contrast has passed into the  rectum. Electronically Signed   By: Claudie Revering M.D.   On: 02/25/2019 14:04    EKG: afib rate 123   ASSESSMENT AND PLAN:   PaF:  In 78 yo with CHADVASC 2 in setting of PSBO.  Currently in NSR. Add beta blocker will arrange outpatient 2 week monitor If he continues to have episodes of PAF will need Eliquis. Patient not keen on chronic anticoagulation. Echo is normal with normal LA size no valve disease and normal EF suggesting low risk for chronic recurrence   Outpatient f/u with me Arrange 2 week holter  Add beta blocker   Signed: Jenkins Rouge 02/26/2019, 8:56 AM

## 2019-02-26 NOTE — Progress Notes (Signed)
Patient stated that when he stands up he becomes dizzy and this has been going on for a few years but has been getting worse lately. Vitals signs 0616 108/73 MAP 84 HR 99. Sitting 0623 136/7 MAP 90 HR 85 standing 88/53 MAP 84 HR 81. Patient stated that he did not have any symptoms at this time but it comes and goes and sometimes he get out of breath. Will continue to monitor

## 2019-02-26 NOTE — Discharge Summary (Signed)
Physician Discharge Summary  Kent [name] [id number] DOB: 04-26-1941 DOA: 02/24/2019  PCP: Shirline Frees, MD  Admit date: 02/24/2019 Discharge date: 02/26/2019  Admitted From: home Disposition:  home   Recommendations for Outpatient Follow-up:  1. F/u with cardiology for results of event monitor  Home Health:  none  Equipment/Devices:  none    Discharge Condition:  stable   CODE STATUS:  Full code   Diet recommendation:  Heart healthy Consultations:  General surgery  cardiology    Discharge Diagnoses:  Principal Problem:   SBO (small bowel obstruction) ([place]) Active Problems:   Chronic back pain   PAF (paroxysmal atrial fibrillation) ([place])     Brief Summary: Kent D Wrightis a 78 y.o.malewith medical history significant ofmultiple prior abdominal surgeries including subtotal R colectomy and multiple SBOs as well as GERD presented with abdominal pain. Patient was last admitted to the surgery service from 1/4-6/20 with the same. He started about 11pm - had a small BM and got ready to go to bed. The pain started about 0200 and the pain became worse and was excruciating until he finally had a BM about 30 minutes.  CT abdomen pelvis was concerning for early small bowel obstruction -The ER patient also had transient atrial fibrillation  Hospital Course:  SBO with h/o multiple abdominal surgeries - has resolved with conservative measures - I have increased his laxatives to combat constipation from his narcotics  A-fib- paroxysmal  - noted to have an short episode of A-fib on 9/4 - a cardiology consult was requested - he has been started on Metoprolol by cardiology today and an event monitor will be mailed to his home -if he continues to have episodes of PAF, cardiology will start anticoagulation - I will start a baby aspirin for now  Chronic back pain - cont MS Contin and Hydrocodone- see below ordered laxatives   Discharge Exam: Vitals:   02/26/19 1146  02/26/19 1151  BP:  (!) 100/50  Pulse:  62  Resp: 20 20  Temp:  (!) 97.5 F (36.4 C)  SpO2:  98%   Vitals:   02/26/19 0819 02/26/19 0827 02/26/19 1146 02/26/19 1151  BP: (!) 120/58 128/62  (!) 100/50  Pulse: 76 80  62  Resp:   20 20  Temp:    (!) 97.5 F (36.4 C)  TempSrc:    Oral  SpO2: 100% 100%  98%  Weight:      Height:        General: Pt is alert, awake, not in acute distress Cardiovascular: RRR, S1/S2 +, no rubs, no gallops Respiratory: CTA bilaterally, no wheezing, no rhonchi Abdominal: Soft, NT, ND, bowel sounds + Extremities: no edema, no cyanosis   Discharge Instructions  Discharge Instructions    Diet - low sodium heart healthy   Complete by: As directed    Increase activity slowly   Complete by: As directed    Increase activity slowly   Complete by: As directed      Allergies as of 02/26/2019   No Known Allergies     Medication List    TAKE these medications   aspirin EC 81 MG tablet Take 1 tablet (81 mg total) by mouth daily.   bisacodyl 10 MG suppository Commonly known as: DULCOLAX Place 1 suppository (10 mg total) rectally daily as needed for moderate constipation.   [facility] Dry Eye Relief 0.2-0.2-1 % Soln Generic drug: Glycerin-Hypromellose-PEG 400 Place 2 drops into both eyes as needed (dry eyes).   HYDROcodone-acetaminophen  10-325 MG tablet Commonly known as: NORCO Take 1 tablet by mouth 3 (three) times daily as needed.   methocarbamol 500 MG tablet Commonly known as: ROBAXIN Take 1,000 mg by mouth 2 (two) times daily.   metoprolol tartrate 25 MG tablet Commonly known as: LOPRESSOR Take 1 tablet (25 mg total) by mouth 2 (two) times daily.   morphine 30 MG 12 hr tablet Commonly known as: MS CONTIN Take 30 mg by mouth every 12 (twelve) hours.   polyethylene glycol 17 g packet Commonly known as: MIRALAX / GLYCOLAX Take 17 g by mouth 2 (two) times daily. What changed: when to take this   PRESERVISION AREDS 2 PO Take 1 tablet by  mouth at bedtime.   senna-docusate 8.6-50 MG tablet Commonly known as: Senokot-S Take 2 tablets by mouth daily as needed for mild constipation.   sodium phosphate 7-19 GM/118ML Enem Place 133 mLs (1 enema total) rectally daily as needed for severe constipation.   tamsulosin 0.4 MG Caps capsule Commonly known as: FLOMAX Take 0.4 mg by mouth at bedtime.   vitamin C 1000 MG tablet Take 1,000 mg by mouth at bedtime.   VITAMIN D3 PO Take 1 tablet by mouth at bedtime.   vitamin E 400 UNIT capsule Take 400 Units by mouth at bedtime.   zinc gluconate 50 MG tablet Take 50 mg by mouth daily.      Follow-up Information    Shirline Frees, MD Follow up.   Specialty: Family Medicine Why: Please follow up in a week and have a BP and HR check Contact information: [place] [address] [phone]          No Known Allergies   Procedures/Studies:    Ct Abdomen Pelvis W Contrast  Result Date: 02/24/2019 CLINICAL DATA:  Abdominal distension. EXAM: CT ABDOMEN AND PELVIS WITH CONTRAST TECHNIQUE: Multidetector CT imaging of the abdomen and pelvis was performed using the standard protocol following bolus administration of intravenous contrast. CONTRAST:  136mL ISOVUE-300 IOPAMIDOL (ISOVUE-300) INJECTION 61% COMPARISON:  June 26, 2018 FINDINGS: Lower chest: No acute abnormality. Small hiatal hernia. Mild diffuse mucosal thickening of the visualized lower portion of the esophagus. Hepatobiliary: No focal liver abnormality is seen. Status post cholecystectomy. No biliary dilatation. Pancreas: Unremarkable. No pancreatic ductal dilatation or surrounding inflammatory changes. Spleen: Normal in size without focal abnormality. Adrenals/Urinary Tract: Adrenal glands are unremarkable. Kidneys are normal, without renal calculi, focal lesion, or hydronephrosis. Bladder is unremarkable. Stomach/Bowel: Bowel wall distension throughout the abdomen with gas and fluid filled  small bowels measuring up to 3.9 cm. Tethering of dilated and decompressed small bowels to the anterior abdominal wall, the level of the umbilicus, where there is evidence of postsurgical scarring. Status post subtotal right colectomy. Short segment of narrowing at the enterocolonic anastomosis. The residual colon has normal appearance. Vascular/Lymphatic: Aortic atherosclerosis. No enlarged abdominal or pelvic lymph nodes. Reproductive: Prostate is unremarkable. Other: No abdominal wall hernia or abnormality. No abdominopelvic ascites. Musculoskeletal: No acute or significant osseous findings. IMPRESSION: 1. Bowel wall distension throughout the abdomen with gas and fluid filled small bowels measuring up to 3.9 cm. Tethering of dilated and decompressed small bowels to the anterior abdominal wall, at the level of the umbilicus, where there is evidence of postsurgical scarring. 2. Status post subtotal right colectomy. Short segment of narrowing at the enterocolonic anastomosis. 3. These findings may represent early or incomplete small bowel obstruction, with likely more than 1 transitional points, caused by scarring/adhesions. 4.  Small hiatal hernia. Mild diffuse mucosal thickening of the visualized lower portion of the esophagus, which may represent esophagitis. Aortic Atherosclerosis (ICD10-I70.0). Electronically Signed   By: Fidela Salisbury M.D.   On: 02/24/2019 14:27   Dg Abd Portable 1v-small Bowel Obstruction Protocol-initial, 8 Hr Delay  Result Date: 02/25/2019 CLINICAL DATA:  Small-bowel obstruction. 8 hour delayed image following oral contrast administration. EXAM: PORTABLE ABDOMEN - 1 VIEW COMPARISON:  06/27/2018 and abdomen and pelvis CT dated 02/24/2019. FINDINGS: Normal bowel gas pattern. Oral contrast in the right colon, rectum and distal sigmoid colon. Cholecystectomy clips. Mild left hip degenerative changes. IMPRESSION: No acute abnormality.  The oral contrast has passed into the rectum.  Electronically Signed   By: Claudie Revering M.D.   On: 02/25/2019 14:04     The results of significant diagnostics from this hospitalization (including imaging, microbiology, ancillary and laboratory) are listed below for reference.     Microbiology: Recent Results (from the past 240 hour(s))  SARS Coronavirus 2 [facility] order, Performed in [facility] hospital lab) Nasopharyngeal Nasopharyngeal Swab     Status: None   Collection Time: 02/24/19 11:00 AM   Specimen: Nasopharyngeal Swab  Result Value Ref Range Status   SARS Coronavirus 2 NEGATIVE NEGATIVE Final    Comment: (NOTE) If result is NEGATIVE SARS-CoV-2 target nucleic acids are NOT DETECTED. The SARS-CoV-2 RNA is generally detectable in upper and lower  respiratory specimens during the acute phase of infection. The lowest  concentration of SARS-CoV-2 viral copies this assay can detect is 250  copies / mL. A negative result does not preclude SARS-CoV-2 infection  and should not be used as the sole basis for treatment or other  patient management decisions.  A negative result may occur with  improper specimen collection / handling, submission of specimen other  than nasopharyngeal swab, presence of viral mutation(s) within the  areas targeted by this assay, and inadequate number of viral copies  (<250 copies / mL). A negative result must be combined with clinical  observations, patient history, and epidemiological information. If result is POSITIVE SARS-CoV-2 target nucleic acids are DETECTED. The SARS-CoV-2 RNA is generally detectable in upper and lower  respiratory specimens dur ing the acute phase of infection.  Positive  results are indicative of active infection with SARS-CoV-2.  Clinical  correlation with patient history and other diagnostic information is  necessary to determine patient infection status.  Positive results do  not rule out bacterial infection or co-infection with other viruses. If result is PRESUMPTIVE  POSTIVE SARS-CoV-2 nucleic acids MAY BE PRESENT.   A presumptive positive result was obtained on the submitted specimen  and confirmed on repeat testing.  While 2019 novel coronavirus  (SARS-CoV-2) nucleic acids may be present in the submitted sample  additional confirmatory testing may be necessary for epidemiological  and / or clinical management purposes  to differentiate between  SARS-CoV-2 and other Sarbecovirus currently known to infect humans.  If clinically indicated additional testing with an alternate test  methodology [phone]) is advised. The SARS-CoV-2 RNA is generally  detectable in upper and lower respiratory sp ecimens during the acute  phase of infection. The expected result is Negative. Fact Sheet for Patients:  StrictlyIdeas.no Fact Sheet for Healthcare Providers: BankingDealers.co.za This test is not yet approved or cleared by the [place] FDA and has been authorized for detection and/or diagnosis of SARS-CoV-2 by FDA under an Emergency Use Authorization (EUA).  This EUA will remain in effect (meaning this test can be used)  for the duration of the COVID-19 declaration under Section 564(b)(1) of the Act, 21 U.S.C. section 360bbb-3(b)(1), unless the authorization is terminated or revoked sooner. Performed at [facility], [place] [address], [place], [place] [address]      Labs: BNP (last 3 results) No results for input(s): BNP in the last 8760 hours. Basic Metabolic Panel: Recent Labs  Lab 02/24/19 0938 02/25/19 0528 02/26/19 0651  NA 135 138 139  K 3.9 3.9 3.3*  CL 96* 105 103  CO2 27 24 26   GLUCOSE 143* 105* 103*  BUN 13 11 10   CREATININE 0.99 0.93 0.91  CALCIUM 10.0 9.3 9.2   Liver Function Tests: Recent Labs  Lab 02/24/19 0938  AST 22  ALT 19  ALKPHOS 64  BILITOT 0.6  PROT 8.2*  ALBUMIN 4.5   No results for input(s): LIPASE, AMYLASE in the last 168 hours. No results for input(s):  AMMONIA in the last 168 hours. CBC: Recent Labs  Lab 02/24/19 0938 02/25/19 0528 02/26/19 0651  WBC 6.8 5.4 3.5*  HGB 13.5 11.8* 11.4*  HCT 41.8 36.4* 35.1*  MCV 88.9 89.9 89.8  PLT 199 182 146*   Cardiac Enzymes: No results for input(s): CKTOTAL, CKMB, CKMBINDEX, TROPONINI in the last 168 hours. BNP: Invalid input(s): POCBNP CBG: No results for input(s): GLUCAP in the last 168 hours. D-Dimer No results for input(s): DDIMER in the last 72 hours. Hgb A1c No results for input(s): HGBA1C in the last 72 hours. Lipid Profile No results for input(s): CHOL, HDL, LDLCALC, TRIG, CHOLHDL, LDLDIRECT in the last 72 hours. Thyroid function studies Recent Labs    02/25/19 1137  TSH 0.378   Anemia work up No results for input(s): VITAMINB12, FOLATE, FERRITIN, TIBC, IRON, RETICCTPCT in the last 72 hours. Urinalysis    Component Value Date/Time   COLORURINE YELLOW 02/10/2018 2016   APPEARANCEUR CLEAR 02/10/2018 2016   LABSPEC 1.008 02/10/2018 2016   PHURINE 5.0 02/10/2018 2016   GLUCOSEU NEGATIVE 02/10/2018 2016   HGBUR NEGATIVE 02/10/2018 2016   BILIRUBINUR NEGATIVE 02/10/2018 2016   KETONESUR NEGATIVE 02/10/2018 2016   PROTEINUR NEGATIVE 02/10/2018 2016   UROBILINOGEN 0.2 10/04/2014 2223   NITRITE NEGATIVE 02/10/2018 2016   LEUKOCYTESUR NEGATIVE 02/10/2018 2016   Sepsis Labs Invalid input(s): PROCALCITONIN,  WBC,  LACTICIDVEN Microbiology Recent Results (from the past 240 hour(s))  SARS Coronavirus 2 [facility] order, Performed in [facility] hospital lab) Nasopharyngeal Nasopharyngeal Swab     Status: None   Collection Time: 02/24/19 11:00 AM   Specimen: Nasopharyngeal Swab  Result Value Ref Range Status   SARS Coronavirus 2 NEGATIVE NEGATIVE Final    Comment: (NOTE) If result is NEGATIVE SARS-CoV-2 target nucleic acids are NOT DETECTED. The SARS-CoV-2 RNA is generally detectable in upper and lower  respiratory specimens during the acute phase of infection. The lowest   concentration of SARS-CoV-2 viral copies this assay can detect is 250  copies / mL. A negative result does not preclude SARS-CoV-2 infection  and should not be used as the sole basis for treatment or other  patient management decisions.  A negative result may occur with  improper specimen collection / handling, submission of specimen other  than nasopharyngeal swab, presence of viral mutation(s) within the  areas targeted by this assay, and inadequate number of viral copies  (<250 copies / mL). A negative result must be combined with clinical  observations, patient history, and epidemiological information. If result is POSITIVE SARS-CoV-2 target nucleic acids are DETECTED. The  SARS-CoV-2 RNA is generally detectable in upper and lower  respiratory specimens dur ing the acute phase of infection.  Positive  results are indicative of active infection with SARS-CoV-2.  Clinical  correlation with patient history and other diagnostic information is  necessary to determine patient infection status.  Positive results do  not rule out bacterial infection or co-infection with other viruses. If result is PRESUMPTIVE POSTIVE SARS-CoV-2 nucleic acids MAY BE PRESENT.   A presumptive positive result was obtained on the submitted specimen  and confirmed on repeat testing.  While 2019 novel coronavirus  (SARS-CoV-2) nucleic acids may be present in the submitted sample  additional confirmatory testing may be necessary for epidemiological  and / or clinical management purposes  to differentiate between  SARS-CoV-2 and other Sarbecovirus currently known to infect humans.  If clinically indicated additional testing with an alternate test  methodology [phone]) is advised. The SARS-CoV-2 RNA is generally  detectable in upper and lower respiratory sp ecimens during the acute  phase of infection. The expected result is Negative. Fact Sheet for Patients:  StrictlyIdeas.no Fact Sheet  for Healthcare Providers: BankingDealers.co.za This test is not yet approved or cleared by the [place] FDA and has been authorized for detection and/or diagnosis of SARS-CoV-2 by FDA under an Emergency Use Authorization (EUA).  This EUA will remain in effect (meaning this test can be used) for the duration of the COVID-19 declaration under Section 564(b)(1) of the Act, 21 U.S.C. section 360bbb-3(b)(1), unless the authorization is terminated or revoked sooner. Performed at [facility], [place] [address], [place], [place] [address]      Time coordinating discharge in minutes: 16  SIGNED:   Debbe Odea, MD  [facility] 02/26/2019, 12:45 PM Pager   If 7PM-7AM, please contact night-coverage [email or web address] Password [email or web address]

## 2019-02-26 NOTE — Plan of Care (Signed)
  Problem: Nutrition: Goal: Adequate nutrition will be maintained Outcome: Progressing   Problem: Elimination: Goal: Will not experience complications related to bowel motility Outcome: Progressing Goal: Will not experience complications related to urinary retention Outcome: Progressing   Problem: Pain Managment: Goal: General experience of comfort will improve Outcome: Progressing   Problem: Safety: Goal: Ability to remain free from injury will improve Outcome: Progressing   Problem: Safety: Goal: Ability to remain free from injury will improve Outcome: Progressing

## 2019-04-12 ENCOUNTER — Other Ambulatory Visit: Payer: Self-pay | Admitting: *Deleted

## 2019-04-12 DIAGNOSIS — I480 Paroxysmal atrial fibrillation: Secondary | ICD-10-CM

## 2019-04-12 DIAGNOSIS — R0602 Shortness of breath: Secondary | ICD-10-CM

## 2019-04-13 ENCOUNTER — Telehealth: Payer: Self-pay | Admitting: *Deleted

## 2019-04-13 NOTE — Telephone Encounter (Signed)
[employer] to ship a 30 day cardiac event monitor to the patients home.  Instructions reviewed briefly as they are included in the monitor kit. 

## 2019-05-26 ENCOUNTER — Telehealth: Payer: Self-pay

## 2019-05-26 NOTE — Telephone Encounter (Signed)
° ° °  Called patient to schedule [facility] referral. Patient declined to schedule, referral rejected.

## 2019-11-25 ENCOUNTER — Other Ambulatory Visit: Payer: Self-pay | Admitting: Orthopaedic Surgery

## 2019-11-25 DIAGNOSIS — M5106 Intervertebral disc disorders with myelopathy, lumbar region: Secondary | ICD-10-CM

## 2019-12-27 ENCOUNTER — Ambulatory Visit
Admission: RE | Admit: 2019-12-27 | Discharge: 2019-12-27 | Disposition: A | Discharge status: Home / Self Care | Payer: Medicare Other | Source: Ambulatory Visit | Attending: Orthopaedic Surgery | Admitting: Orthopaedic Surgery

## 2019-12-27 ENCOUNTER — Other Ambulatory Visit: Payer: Self-pay

## 2019-12-27 DIAGNOSIS — M5106 Intervertebral disc disorders with myelopathy, lumbar region: Secondary | ICD-10-CM

## 2019-12-27 MED ORDER — GADOBENATE DIMEGLUMINE 529 MG/ML IV SOLN
14.0000 mL | Freq: Once | INTRAVENOUS | Status: AC | PRN
  Administered 2019-12-27: 14 mL via INTRAVENOUS

## 2020-10-15 ENCOUNTER — Emergency Department (HOSPITAL_COMMUNITY): Payer: Medicare Other

## 2020-10-15 ENCOUNTER — Emergency Department (HOSPITAL_COMMUNITY)
Admission: EM | Admit: 2020-10-15 | Discharge: 2020-10-16 | Disposition: A | Discharge status: Home / Self Care | Payer: Medicare Other | Attending: Emergency Medicine | Admitting: Emergency Medicine

## 2020-10-15 DIAGNOSIS — Y93H2 Activity, gardening and landscaping: Secondary | ICD-10-CM | POA: Diagnosis not present

## 2020-10-15 DIAGNOSIS — S01111A Laceration without foreign body of right eyelid and periocular area, initial encounter: Secondary | ICD-10-CM | POA: Diagnosis not present

## 2020-10-15 DIAGNOSIS — S0181XA Laceration without foreign body of other part of head, initial encounter: Secondary | ICD-10-CM | POA: Diagnosis not present

## 2020-10-15 DIAGNOSIS — S0083XA Contusion of other part of head, initial encounter: Secondary | ICD-10-CM

## 2020-10-15 DIAGNOSIS — Z87891 Personal history of nicotine dependence: Secondary | ICD-10-CM | POA: Diagnosis not present

## 2020-10-15 DIAGNOSIS — S62522A Displaced fracture of distal phalanx of left thumb, initial encounter for closed fracture: Secondary | ICD-10-CM | POA: Diagnosis not present

## 2020-10-15 DIAGNOSIS — Z23 Encounter for immunization: Secondary | ICD-10-CM | POA: Diagnosis not present

## 2020-10-15 DIAGNOSIS — S6992XA Unspecified injury of left wrist, hand and finger(s), initial encounter: Secondary | ICD-10-CM | POA: Diagnosis present

## 2020-10-15 DIAGNOSIS — Z85828 Personal history of other malignant neoplasm of skin: Secondary | ICD-10-CM | POA: Insufficient documentation

## 2020-10-15 DIAGNOSIS — W01198A Fall on same level from slipping, tripping and stumbling with subsequent striking against other object, initial encounter: Secondary | ICD-10-CM | POA: Insufficient documentation

## 2020-10-15 DIAGNOSIS — W19XXXA Unspecified fall, initial encounter: Secondary | ICD-10-CM

## 2020-10-15 MED ORDER — TETANUS-DIPHTH-ACELL PERTUSSIS 5-2.5-18.5 LF-MCG/0.5 IM SUSY
0.5000 mL | PREFILLED_SYRINGE | Freq: Once | INTRAMUSCULAR | Status: AC
  Administered 2020-10-16: 0.5 mL via INTRAMUSCULAR
  Filled 2020-10-15: qty 0.5

## 2020-10-15 MED ORDER — ACETAMINOPHEN 325 MG PO TABS
650.0000 mg | ORAL_TABLET | Freq: Once | ORAL | Status: AC
  Administered 2020-10-15: 650 mg via ORAL
  Filled 2020-10-15: qty 2

## 2020-10-15 MED ORDER — LIDOCAINE-EPINEPHRINE-TETRACAINE (LET) TOPICAL GEL
3.0000 mL | Freq: Once | TOPICAL | Status: AC
  Administered 2020-10-15: 3 mL via TOPICAL
  Filled 2020-10-15: qty 3

## 2020-10-15 NOTE — ED Triage Notes (Signed)
Pt reports fall while out gardening. No LOC. No blood thinners. Alert, oriented x4, VSS.

## 2020-10-15 NOTE — ED Triage Notes (Signed)
Emergency Medicine Provider Triage Evaluation Note  Kent [name] , a 80 y.o. male  was evaluated in triage.  Pt complains of laceration to right eyebrow. Patient tripped and hit his eyebrow on the corner of a planter. Unsure when his last tetanus shot was. He is not currently on any blood thinners. No LOC. Denies any visual changes.   Review of Systems  Positive: wound Negative: Visual changes  Physical Exam  BP (!) 183/104 (BP Location: Right Arm)   Pulse 94   Temp 97.9 F (36.6 C) (Oral)   Resp 15   SpO2 96%  Gen:   Awake, no distress   HEENT:  Laceration above right eye Resp:  Normal effort  Cardiac:  Normal rate  Abd:   Nondistended, nontender  MSK:   Moves extremities without difficulty  Neuro:  Speech clear   Medical Decision Making  Medically screening exam initiated at 8:46 PM.  Appropriate orders placed.  Kent [name] was informed that the remainder of the evaluation will be completed by another provider, this initial triage assessment does not replace that evaluation, and the importance of remaining in the ED until their evaluation is complete.  Clinical Impression  Laceration to face. CT head/maxillofacial ordered.    Suzy Bouchard, [place] 10/15/20 2049

## 2020-10-16 ENCOUNTER — Emergency Department (HOSPITAL_COMMUNITY): Payer: Medicare Other

## 2020-10-16 DIAGNOSIS — S62522A Displaced fracture of distal phalanx of left thumb, initial encounter for closed fracture: Secondary | ICD-10-CM | POA: Diagnosis not present

## 2020-10-16 MED ORDER — LIDOCAINE-EPINEPHRINE (PF) 2 %-1:200000 IJ SOLN
10.0000 mL | Freq: Once | INTRAMUSCULAR | Status: AC
  Administered 2020-10-16: 10 mL
  Filled 2020-10-16: qty 20

## 2020-10-16 MED ORDER — OXYCODONE-ACETAMINOPHEN 5-325 MG PO TABS
1.0000 | ORAL_TABLET | Freq: Once | ORAL | Status: AC
  Administered 2020-10-16: 1 via ORAL
  Filled 2020-10-16: qty 1

## 2020-10-16 NOTE — Discharge Instructions (Addendum)
You were seen in the ER after a fall  CT of your face and head did not reveal fractures or internal bleeding. You have a hematoma of your right eyebrow.  The very tip of your thumb is fractured.    Wear finger splint and follow up with hand specialist in 7 days.  Call them and let them know you were referred from the ER. Return if there is severe finger tip swelling, coolness, or bruising underneath the finger nail   You had 2 lacerations. The first one was repaired with 8 sutures.  The second one was repaired with 2 sutures. Your tetanus shot was updated. Your sutures need to come out within 5-7 days.   The original dressing should be left in place for 24 hours.  If your laceration is small enough, you can remove original dressing after 24 hours after which laceration can be opened to air. Laceration can then be gently cleaned with mild soap and water after 24 hours of laceration repair to prevent crusting over the suture knots. An antibiotic ointment can be applied to the wound as well, twice daily until suture removal. If your laceration is large or can be contaminated you can cover it with a dressing throughout the day.   You may shower or wash the wound with soap and water. Avoid prolonged soaking of stitches including swimming in chlorinated water, pools, hot tubs. Do not swim or soak in natural bodies of water because of a potential increased risk of infection.   Return for swelling, pain, redness, pus, fevers.

## 2020-10-16 NOTE — ED Notes (Signed)
Pt ambulated with this NT to the bathroom. Pt ambulated with steady gait but stated he felt slightly off-balance. Pt had bowel movement and placed back on monitor.

## 2020-10-16 NOTE — Progress Notes (Signed)
Orthopedic Tech Progress Note Patient Details:  Kent [name] 30-Jun-1940 [id number]  Ortho Devices Type of Ortho Device: Finger splint Ortho Device/Splint Location: lue thumb splint Ortho Device/Splint Interventions: Ordered,Application,Adjustment   Post Interventions Patient Tolerated: Well Instructions Provided: Care of device,Adjustment of device   Karolee Stamps 10/16/2020, 5:51 AM

## 2020-10-16 NOTE — ED Provider Notes (Signed)
[facility] EMERGENCY DEPARTMENT Provider Note   CSN: [id number] Arrival date & time: 10/15/20  2034     History Chief Complaint  Patient presents with  . Fall    Kent [name] is a 80 y.o. male presents to the ED for evaluation of fall that happened around 6 PM today while he was gardening.  States he tripped and fell forward striking his right forehead.  He sustained a wound that bled heavily above his right eyebrow.  Also reports left thumb pain with bruising.  Denies loss of consciousness.  He was able to stand up and ambulate without assistance.  Reports constant, severe pain around the wound and a local headache.  No visual changes, nausea, vomiting.  No neck pain.  Denies any other physical injuries.  He is not on any anticoagulants.  Unknown last tetanus status.   HPI     Past Medical History:  Diagnosis Date  . 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 Cancer ([place])    basal cell cancer - skin  . Chronic back pain    s/p back surgery  . GERD (gastroesophageal reflux disease) 08/18/2013  . Incisional hernia, without obstruction or gangrene 02/02/2013  . Postural dizziness with presyncope 09/01/2016  . Sinus infection    currently on antibiotics  . Small bowel obstruction due to adhesions [facility])     Patient Active Problem List   Diagnosis Date Noted  . PAF (paroxysmal atrial fibrillation) ([place]) 02/26/2019  . S/P exploratory laparotomy 03/10/2017  . Elevated blood pressure reading 12/30/2016  . Nonspecific abnormal electrocardiogram (ECG) (EKG) 09/01/2016  . History of colectomy 02/13/2016  . Intractable abdominal pain 02/13/2016  . S/P laparoscopic cholecystectomy 10/16/2014  . S/P hernia repair 04/13/2014  . Pre-op evaluation 03/24/2014  . H/O abdominal surgery 08/18/2013  . GERD (gastroesophageal reflux disease) 08/18/2013  . Chronic back pain 08/18/2013  . SBO (small bowel obstruction) ([place]) 08/12/2013    Past Surgical History:  Procedure  Laterality Date  . BACK SURGERY  08/25/2001   Neck 3 disk replacements  . BACK SURGERY     Placement/removal of nerve stimulator  . CHOLECYSTECTOMY N/A 10/16/2014   Procedure: LAPAROSCOPIC CHOLECYSTECTOMY WITH ATTEMPTED CHOLANGIOGRAM;  Surgeon: Georganna Skeans, MD;  Location: [place];  Service: General;  Laterality: N/A;  . COLON SURGERY  04/2002   Dr. Deon Pilling - Subtotal colectomy, ileostomy  . ELBOW SURGERY Right    for blood vessel & " hung" nerve  . ESOPHAGEAL DILATION    . EXPLORATORY LAPAROTOMY  03/10/2017  . HERNIA REPAIR    . Ileostomy Takedown  08/2002   Dr. Deon Pilling  . INCISION AND DRAINAGE FOOT    . INCISIONAL HERNIA REPAIR N/A 01/20/2013   Procedure: HERNIA REPAIR INCISIONAL;  Surgeon: Zenovia Jarred, MD;  Location: [place];  Service: General;  Laterality: N/A;  . [place]  04/13/2014   with mesh    dr Grandville Silos  . INCISIONAL HERNIA REPAIR N/A 04/13/2014   Procedure: OPEN REPAIR INCISIONAL HERNIA ;  Surgeon: Georganna Skeans, MD;  Location: [place];  Service: General;  Laterality: N/A;  . INSERTION OF MESH N/A 04/13/2014   Procedure: INSERTION OF MESH;  Surgeon: Georganna Skeans, MD;  Location: [place];  Service: General;  Laterality: N/A;  . KNEE ARTHROSCOPY Right 2010  . LAPAROSCOPIC CHOLECYSTECTOMY  10/16/2014  . LAPAROTOMY N/A 01/20/2013   Procedure: EXPLORATORY LAPAROTOMY;  Surgeon: Zenovia Jarred, MD;  Location: [facility]  OR;  Service: General;  Laterality: N/A;  . LAPAROTOMY N/A 03/10/2017   Procedure: EXPLORATORY LAPAROTOMY;  Surgeon: Georganna Skeans, MD;  Location: [place];  Service: General;  Laterality: N/A;  . Laparotomy with lysis of adhesions  2010   Dr. Grandville Silos - SBO  . LYSIS OF ADHESION N/A 01/20/2013   Procedure: LYSIS OF ADHESION;  Surgeon: Zenovia Jarred, MD;  Location: [place];  Service: General;  Laterality: N/A;  . LYSIS OF ADHESION N/A 03/10/2017   Procedure: LYSIS OF ADHESION;  Surgeon: Georganna Skeans, MD;  Location: [place];  Service: General;  Laterality: N/A;   . microsurgical  03/24/2002   Lateral recess decompression L5  . SACRAL NERVE STIMULATOR PLACEMENT     inserted and removed  . SHOULDER ARTHROSCOPY Bilateral   . VASECTOMY         Family History  Problem Relation Age of Onset  . Heart disease Mother   . Brain cancer Father     Social History   Tobacco Use  . Smoking status: Former Smoker    Quit date: 10/09/1979    Years since quitting: 41.0  . Smokeless tobacco: Former Systems developer    Types: Chew    Quit date: 03/05/1986  Vaping Use  . Vaping Use: Never used  Substance Use Topics  . Alcohol use: No  . Drug use: No    Home Medications Prior to Admission medications   Medication Sig Start Date End Date Taking? Authorizing Provider  Ascorbic Acid (VITAMIN C) 1000 MG tablet Take 1,000 mg by mouth at bedtime.     [provider]  bisacodyl (DULCOLAX) 10 MG suppository Place 1 suppository (10 mg total) rectally daily as needed for moderate constipation. 02/26/19   Debbe Odea, MD  Cholecalciferol (VITAMIN D3 PO) Take 1 tablet by mouth at bedtime.     [provider]  Glycerin-Hypromellose-PEG 400 ([employer] DRY EYE RELIEF) 0.2-0.2-1 % SOLN Place 2 drops into both eyes as needed (dry eyes).    [provider]  HYDROcodone-acetaminophen (NORCO) 10-325 MG tablet Take 1 tablet by mouth 3 (three) times daily as needed. 03/21/18   [provider]  methocarbamol (ROBAXIN) 500 MG tablet Take 1,000 mg by mouth 2 (two) times daily. 03/21/18   [provider]  metoprolol tartrate (LOPRESSOR) 25 MG tablet Take 1 tablet (25 mg total) by mouth 2 (two) times daily. 02/26/19   Debbe Odea, MD  morphine (MS CONTIN) 30 MG 12 hr tablet Take 30 mg by mouth every 12 (twelve) hours. 01/20/18   [provider]  Multiple Vitamins-Minerals (PRESERVISION AREDS 2 PO) Take 1 tablet by mouth at bedtime.     [provider]  polyethylene glycol (MIRALAX / GLYCOLAX) 17 g packet Take 17 g by mouth 2 (two) times  daily. 02/26/19   Debbe Odea, MD  sodium phosphate (FLEET) 7-19 GM/118ML ENEM Place 133 mLs (1 enema total) rectally daily as needed for severe constipation. 02/26/19   Debbe Odea, MD  tamsulosin (FLOMAX) 0.4 MG CAPS capsule Take 0.4 mg by mouth at bedtime.    [provider]  vitamin E 400 UNIT capsule Take 400 Units by mouth at bedtime.     [provider]  zinc gluconate 50 MG tablet Take 50 mg by mouth daily.    [provider]    Allergies    Patient has no known allergies.  Review of Systems   Review of Systems  Musculoskeletal: Positive for arthralgias.  Skin: Positive for color  change and wound.  All other systems reviewed and are negative.   Physical Exam Updated Vital Signs BP (!) 176/98   Pulse 86   Temp 98 F (36.7 C) (Oral)   Resp 15   SpO2 97%   Physical Exam Vitals and nursing note reviewed.  Constitutional:      General: He is not in acute distress.    Appearance: He is well-developed.     Comments: NAD.  HENT:     Head: Normocephalic.     Comments: 8 cm U-shaped flap like laceration above the right eyebrow, gaping, oozing blood with grass/debris.  3 cm laceration right supraorbital area below the eyebrow, gaping, oozing blood. No involvement of eyelid.  Bilateral sclera/conjunctiva injected mildly otherwise globe normal. Full ROM of right eye. PERRL bilaterally. Mild upper eye lid ecchymosis but no periorbital, zygomatic or nasal bone tenderness.     Right Ear: External ear normal.     Left Ear: External ear normal.     Nose: Nose normal.  Eyes:     General: No scleral icterus.    Conjunctiva/sclera: Conjunctivae normal.  Cardiovascular:     Rate and Rhythm: Normal rate and regular rhythm.     Heart sounds: Normal heart sounds. No murmur heard.   Pulmonary:     Effort: Pulmonary effort is normal.     Breath sounds: Normal breath sounds. No wheezing.  Musculoskeletal:        General: Normal range of motion.     Cervical  back: Normal range of motion and neck supple.     Comments: Focal tenderness diffusely left thumb at [facility], IP joint and distal phalanx with ecchymosis and small subungal hematoma covering approx 1/3 of nail bed.  Decreased ROM secondary to pain. No wrist or scaphoid tenderness.   Skin:    General: Skin is warm and dry.     Capillary Refill: Capillary refill takes less than 2 seconds.  Neurological:     Mental Status: He is alert and oriented to person, place, and time.     Comments:  Awake, alert. Speech clear. Sensation to light touch intact in face, upper/lower extremities. Strength equal and symmetric bilaterally. No arm or leg drop/drift. Normal FTN.    Cranial Nerves: I not tested II visual fields full bilaterally. PERRL.   III, IV, VI EOMs intact without ptosis V sensation to light touch intact in all 3 divisions of trigeminal nerve bilaterally  VII facial movements symmetric bilaterally VIII hearing intact to voice/conversation  IX, X no uvula deviation, symmetric rise of soft palate/uvula XI 5/5 SCM and trapezius strength bilaterally  XII tongue protrusion midline, symmetric L/R movements  Psychiatric:        Behavior: Behavior normal.        Thought Content: Thought content normal.        Judgment: Judgment normal.     ED Results / Procedures / Treatments   Labs (all labs ordered are listed, but only abnormal results are displayed) Labs Reviewed - No data to display  EKG None  Radiology DG Wrist Complete Left  Result Date: 10/16/2020 CLINICAL DATA:  Left thumb pain after a fall. Swelling and bruising. EXAM: LEFT WRIST - COMPLETE 3+ VIEW; LEFT HAND - COMPLETE 3+ VIEW COMPARISON:  None. FINDINGS: Three views of the left hand and four views of the left wrist are obtained. Comminuted crush fractures of the distal phalangeal tuft of the left first finger. Mild degenerative changes in the interphalangeal joints. No additional  fracture or dislocation. No focal bone lesion or  bone destruction. Soft tissues are unremarkable. IMPRESSION: Comminuted crush fractures of the distal phalangeal tuft of the left first finger. Electronically Signed   By: Lucienne Capers M.D.   On: 10/16/2020 03:27   CT Head Wo Contrast  Result Date: 10/15/2020 CLINICAL DATA:  Facial trauma after a fall while gardening. No loss of consciousness. No blood thinners. Alert and oriented. EXAM: CT HEAD WITHOUT CONTRAST CT MAXILLOFACIAL WITHOUT CONTRAST TECHNIQUE: Multidetector CT imaging of the head and maxillofacial structures were performed using the standard protocol without intravenous contrast. Multiplanar CT image reconstructions of the maxillofacial structures were also generated. COMPARISON:  None. FINDINGS: CT HEAD FINDINGS Brain: Diffuse cerebral atrophy. Ventricular dilatation is likely due to central atrophy. Low-attenuation changes in the deep white matter consistent with small vessel ischemia. No mass-effect or midline shift. No abnormal extra-axial fluid collections. Gray-white matter junctions are distinct. Basal cisterns are not effaced. No acute intracranial hemorrhage. Vascular: Moderate intracranial arterial vascular calcifications. Skull: The calvarium appears intact. Subcutaneous scalp hematoma over the right anterior frontal supraorbital region. Other: None. CT MAXILLOFACIAL FINDINGS Osseous: The nasal bones, orbital bones, facial bones, mandibles, and temporomandibular joints appear intact. No acute displaced fractures are identified. Orbits: Mild periorbital soft tissue swelling/hematoma on the right. No retrobulbar involvement. The globes and extraocular muscles appear intact and symmetrical. Sinuses: The paranasal sinuses and mastoid air cells are clear. Postoperative changes consistent with bilateral nasal turbinate resections. Resection or resorption of the ethmoid septations. Soft tissues: No additional soft tissue swelling/hematoma. No radiopaque soft tissue foreign bodies.  IMPRESSION: 1. No acute intracranial abnormalities. Chronic atrophy and small vessel ischemic changes. 2. Mild right periorbital soft tissue swelling/hematoma. No acute displaced orbital or facial fractures identified. Electronically Signed   By: Lucienne Capers M.D.   On: 10/15/2020 22:32   DG Hand Complete Left  Result Date: 10/16/2020 CLINICAL DATA:  Left thumb pain after a fall. Swelling and bruising. EXAM: LEFT WRIST - COMPLETE 3+ VIEW; LEFT HAND - COMPLETE 3+ VIEW COMPARISON:  None. FINDINGS: Three views of the left hand and four views of the left wrist are obtained. Comminuted crush fractures of the distal phalangeal tuft of the left first finger. Mild degenerative changes in the interphalangeal joints. No additional fracture or dislocation. No focal bone lesion or bone destruction. Soft tissues are unremarkable. IMPRESSION: Comminuted crush fractures of the distal phalangeal tuft of the left first finger. Electronically Signed   By: Lucienne Capers M.D.   On: 10/16/2020 03:27   CT Maxillofacial Wo Contrast  Result Date: 10/15/2020 CLINICAL DATA:  Facial trauma after a fall while gardening. No loss of consciousness. No blood thinners. Alert and oriented. EXAM: CT HEAD WITHOUT CONTRAST CT MAXILLOFACIAL WITHOUT CONTRAST TECHNIQUE: Multidetector CT imaging of the head and maxillofacial structures were performed using the standard protocol without intravenous contrast. Multiplanar CT image reconstructions of the maxillofacial structures were also generated. COMPARISON:  None. FINDINGS: CT HEAD FINDINGS Brain: Diffuse cerebral atrophy. Ventricular dilatation is likely due to central atrophy. Low-attenuation changes in the deep white matter consistent with small vessel ischemia. No mass-effect or midline shift. No abnormal extra-axial fluid collections. Gray-white matter junctions are distinct. Basal cisterns are not effaced. No acute intracranial hemorrhage. Vascular: Moderate intracranial arterial  vascular calcifications. Skull: The calvarium appears intact. Subcutaneous scalp hematoma over the right anterior frontal supraorbital region. Other: None. CT MAXILLOFACIAL FINDINGS Osseous: The nasal bones, orbital bones, facial bones, mandibles, and temporomandibular joints appear intact. No acute  displaced fractures are identified. Orbits: Mild periorbital soft tissue swelling/hematoma on the right. No retrobulbar involvement. The globes and extraocular muscles appear intact and symmetrical. Sinuses: The paranasal sinuses and mastoid air cells are clear. Postoperative changes consistent with bilateral nasal turbinate resections. Resection or resorption of the ethmoid septations. Soft tissues: No additional soft tissue swelling/hematoma. No radiopaque soft tissue foreign bodies. IMPRESSION: 1. No acute intracranial abnormalities. Chronic atrophy and small vessel ischemic changes. 2. Mild right periorbital soft tissue swelling/hematoma. No acute displaced orbital or facial fractures identified. Electronically Signed   By: Lucienne Capers M.D.   On: 10/15/2020 22:32    Procedures .Marland KitchenLaceration Repair  Date/Time: 10/16/2020 4:45 AM Performed by: Kinnie Feil, PA-C Authorized by: Kinnie Feil, PA-C   Consent:    Consent obtained:  Verbal   Consent given by:  Patient   Risks discussed:  Infection, need for additional repair, pain, poor cosmetic result and poor wound healing   Alternatives discussed:  No treatment and delayed treatment Universal protocol:    Procedure explained and questions answered to patient or proxy's satisfaction: yes     Relevant documents present and verified: yes     Test results available: yes     Imaging studies available: yes     Required blood products, implants, devices, and special equipment available: yes     Site/side marked: yes     Immediately prior to procedure, a time out was called: yes     Patient identity confirmed:  Verbally with  patient Anesthesia:    Anesthesia method:  Local infiltration Laceration details:    Location:  Face   Face location:  R eyebrow   Length (cm):  8 Pre-procedure details:    Preparation:  Patient was prepped and draped in usual sterile fashion and imaging obtained to evaluate for foreign bodies Exploration:    Hemostasis achieved with:  Direct pressure and epinephrine   Imaging obtained: x-ray     Imaging outcome: foreign body noted (grass/debris)     Wound exploration: wound explored through full range of motion and entire depth of wound visualized     Wound extent: muscle damage (questionable/partial) and vascular damage (superficial arterial bleed x 2)   Treatment:    Area cleansed with:  Povidone-iodine and saline   Amount of cleaning:  Extensive   Irrigation solution:  Sterile saline   Irrigation method:  Syringe   Layers/structures repaired:  Deep subcutaneous Deep subcutaneous:    Suture size:  5-0   Suture material:  Vicryl   Suture technique:  Horizontal mattress   Number of sutures:  1 Skin repair:    Repair method:  Sutures   Suture size:  6-0   Suture material:  Prolene   Suture technique:  Simple interrupted   Number of sutures:  8 Approximation:    Approximation:  Close Repair type:    Repair type:  Intermediate Post-procedure details:    Dressing:  Antibiotic ointment, non-adherent dressing and bulky dressing   Procedure completion:  Tolerated well, no immediate complications  .Marland KitchenLaceration Repair  Date/Time: 10/16/2020 4:49 AM Performed by: Kinnie Feil, PA-C Authorized by: Kinnie Feil, PA-C   Consent:    Consent obtained:  Verbal   Consent given by:  Patient   Risks discussed:  Infection, need for additional repair, pain, poor cosmetic result and poor wound healing   Alternatives discussed:  No treatment and delayed treatment Universal protocol:    Procedure explained and questions answered to patient  or proxy's satisfaction: yes      Relevant documents present and verified: yes     Test results available: yes     Imaging studies available: yes     Required blood products, implants, devices, and special equipment available: yes     Site/side marked: yes     Immediately prior to procedure, a time out was called: yes     Patient identity confirmed:  Verbally with patient Anesthesia:    Anesthesia method:  Local infiltration   Local anesthetic:  Lidocaine 1% w/o epi Laceration details:    Location:  Face   Face location:  R eyebrow   Length (cm):  3 Pre-procedure details:    Preparation:  Patient was prepped and draped in usual sterile fashion and imaging obtained to evaluate for foreign bodies Exploration:    Limited defect created (wound extended): no     Hemostasis achieved with:  Epinephrine and direct pressure   Imaging obtained: x-ray     Imaging outcome: foreign body not noted     Wound exploration: wound explored through full range of motion and entire depth of wound visualized     Wound extent: no muscle damage noted, no underlying fracture noted and no vascular damage noted     Contaminated: yes   Treatment:    Area cleansed with:  Saline and povidone-iodine   Amount of cleaning:  Extensive   Irrigation solution:  Sterile saline   Irrigation method:  Syringe and tap   Debridement:  None Skin repair:    Repair method:  Sutures   Suture size:  6-0   Suture material:  Prolene   Suture technique:  Simple interrupted   Number of sutures:  2 Approximation:    Approximation:  Close Repair type:    Repair type:  Simple Post-procedure details:    Dressing:  Antibiotic ointment, non-adherent dressing and bulky dressing   Procedure completion:  Tolerated well, no immediate complications     Medications Ordered in ED Medications  Tdap (BOOSTRIX) injection 0.5 mL (0.5 mLs Intramuscular Given 10/16/20 0155)  lidocaine-EPINEPHrine-tetracaine (LET) topical gel (3 mLs Topical Given 10/15/20 2107)   acetaminophen (TYLENOL) tablet 650 mg (650 mg Oral Given 10/15/20 2104)  lidocaine-EPINEPHrine (XYLOCAINE W/EPI) 2 %-1:200000 (PF) injection 10 mL (10 mLs Infiltration Given 10/16/20 0250)  oxyCODONE-acetaminophen (PERCOCET/ROXICET) 5-325 MG per tablet 1 tablet (1 tablet Oral Given 10/16/20 0248)    ED Course  I have reviewed the triage vital signs and the nursing notes.  Pertinent labs & imaging results that were available during my care of the patient were reviewed by me and considered in my medical decision making (see chart for details).  Clinical Course as of 10/16/20 0457  Tue Oct 16, 2020  0452 CT Maxillofacial Wo Contrast IMPRESSION: 1. No acute intracranial abnormalities. Chronic atrophy and small vessel ischemic changes. 2. Mild right periorbital soft tissue swelling/hematoma. No acute displaced orbital or facial fractures identified. [CG]  0453 DG Hand Complete Left IMPRESSION: Comminuted crush fractures of the distal phalangeal tuft of the left first finger. [CG]    Clinical Course User Index [CG] Kinnie Feil, PA-C   MDM Rules/Calculators/A&P                          80 yo M presents after a mechanical fall. Has 2 right eyebrow lacerations and left thumb pain, ecchymosis.  No red flags like prodromal symptoms leading to the fall, LOC, vomiting, neuro  deficits, OAC use.  He denies any other physical injuries.   Initial imaging ordered at triage, I have added x-ray of hand.    Imaging personally reviewed and interpreted.   Imaging reveals - comminuted crush fracture of distal phalangeal tuft of left thumb, right supraorbital hematoma.   Medicines in ER - lidocaine/epi, percocet, ,tdap, tylenol  Lacerations repaired without immediate complications. Head compression wrap and finger split given.   Will discharge with wound care instructions, elevation, tylenol and PCP follow up for suture removal and wound re-check. Return precautions discussed with patient. He is  ambulatory here.  Shared with EDP Wickline who evaluated patient.    Final Clinical Impression(s) / ED Diagnoses Final diagnoses:  Fall, initial encounter  Facial laceration, initial encounter  Closed displaced fracture of distal phalanx of left thumb, initial encounter  Facial hematoma, initial encounter    Rx / DC Orders ED Discharge Orders    None       Kinnie Feil, PA-C 10/16/20 0457    Ripley Fraise, MD 10/16/20 [phone]

## 2020-10-16 NOTE — ED Notes (Signed)
Pt discharged and wheeled out of the ED in a wheel chair without difficulty. 

## 2020-10-16 NOTE — ED Notes (Signed)
Ortho tech applied finger splint to pt's thumb.

## 2021-03-28 ENCOUNTER — Other Ambulatory Visit: Payer: Self-pay | Admitting: Orthopaedic Surgery

## 2021-03-28 DIAGNOSIS — M5416 Radiculopathy, lumbar region: Secondary | ICD-10-CM

## 2021-04-23 ENCOUNTER — Ambulatory Visit
Admission: RE | Admit: 2021-04-23 | Discharge: 2021-04-23 | Disposition: A | Discharge status: Home / Self Care | Payer: Medicare Other | Source: Ambulatory Visit | Attending: Orthopaedic Surgery | Admitting: Orthopaedic Surgery

## 2021-04-23 DIAGNOSIS — M5416 Radiculopathy, lumbar region: Secondary | ICD-10-CM

## 2021-04-23 MED ORDER — GADOBENATE DIMEGLUMINE 529 MG/ML IV SOLN
15.0000 mL | Freq: Once | INTRAVENOUS | Status: AC | PRN
  Administered 2021-04-23: 15 mL via INTRAVENOUS

## 2021-07-23 NOTE — Therapy (Signed)
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       Cancer ([place])    basal cell cancer - skin   Chronic back pain    s/p back surgery   GERD (gastroesophageal reflux disease) 08/18/2013   Incisional hernia, without obstruction or gangrene 02/02/2013   Postural dizziness with presyncope 09/01/2016   Sinus infection    currently on antibiotics   Small bowel obstruction due to adhesions [facility])    Past Surgical History:  Procedure Laterality Date   BACK SURGERY  08/25/2001   Neck 3 disk replacements   BACK SURGERY     Placement/removal of nerve stimulator   CHOLECYSTECTOMY N/A 10/16/2014   Procedure: LAPAROSCOPIC CHOLECYSTECTOMY WITH ATTEMPTED CHOLANGIOGRAM;  Surgeon: Georganna Skeans, MD;  Location: [place];  Service: General;  Laterality: N/A;   COLON SURGERY  04/2002   Dr. Deon Pilling - Subtotal colectomy, ileostomy   ELBOW SURGERY Right    for blood vessel & " hung" nerve   ESOPHAGEAL DILATION     EXPLORATORY LAPAROTOMY  03/10/2017   HERNIA REPAIR     Ileostomy Takedown  08/2002   Dr. Deon Pilling   INCISION AND DRAINAGE FOOT     INCISIONAL HERNIA REPAIR N/A 01/20/2013   Procedure: HERNIA REPAIR INCISIONAL;  Surgeon: Zenovia Jarred, MD;  Location: [place];  Service: General;  Laterality: N/A;   [place]  04/13/2014   with mesh    dr Alene Mires HERNIA REPAIR N/A 04/13/2014   Procedure: OPEN REPAIR INCISIONAL HERNIA ;  Surgeon: Georganna Skeans, MD;  Location: [place];  Service: General;  Laterality: N/A;   INSERTION OF MESH N/A 04/13/2014   Procedure:  INSERTION OF MESH;  Surgeon: Georganna Skeans, MD;  Location: [place];  Service: General;  Laterality: N/A;   KNEE ARTHROSCOPY Right 2010   LAPAROSCOPIC CHOLECYSTECTOMY  10/16/2014   LAPAROTOMY N/A 01/20/2013   Procedure: EXPLORATORY LAPAROTOMY;  Surgeon: Zenovia Jarred, MD;  Location: [place];  Service: General;  Laterality: N/A;   LAPAROTOMY N/A 03/10/2017   Procedure: EXPLORATORY LAPAROTOMY;  Surgeon: Georganna Skeans, MD;  Location: [place];  Service: General;  Laterality: N/A;   Laparotomy with lysis of adhesions  2010   Dr. Grandville Silos - SBO   LYSIS OF ADHESION N/A 01/20/2013   Procedure: LYSIS OF ADHESION;  Surgeon: Zenovia Jarred, MD;  Location: [place];  Service: General;  Laterality: N/A;   LYSIS OF ADHESION N/A 03/10/2017   Procedure: LYSIS OF ADHESION;  Surgeon: Georganna Skeans, MD;  Location: [place];  Service: General;  Laterality: N/A;   microsurgical  03/24/2002   Lateral recess decompression L5   SACRAL NERVE STIMULATOR PLACEMENT     inserted and removed   SHOULDER ARTHROSCOPY Bilateral    VASECTOMY     Patient Active Problem List   Diagnosis Date Noted   PAF (paroxysmal atrial fibrillation) ([place]) 02/26/2019   S/P  exploratory laparotomy 03/10/2017   Elevated blood pressure reading 12/30/2016   Nonspecific abnormal electrocardiogram (ECG) (EKG) 09/01/2016   History of colectomy 02/13/2016   Intractable abdominal pain 02/13/2016   S/P laparoscopic cholecystectomy 10/16/2014   S/P hernia repair 04/13/2014   Pre-op evaluation 03/24/2014   H/O abdominal surgery 08/18/2013   GERD (gastroesophageal reflux disease) 08/18/2013   Chronic back pain 08/18/2013   SBO (small bowel obstruction) ([place]) 08/12/2013    PCP: Shirline Frees, MD  REFERRING PROVIDER: Starling Manns, MD  REFERRING DIAG: Referral diagnosis: Other spondylosis, lumbosacral region [phone]  THERAPY DIAG:  Chronic right-sided low back pain with right-sided sciatica - Plan: PT plan of care cert/re-cert  Muscle weakness  - Plan: PT plan of care cert/re-cert  Other abnormalities of gait and mobility - Plan: PT plan of care cert/re-cert  ONSET DATE: >2 years  SUBJECTIVE:                                                                                                                                                                                           Kent [name] is a 81 y.o. male who presents to clinic with chief complaint of R sided low back pain with radicular pain.    MOI/History of condition:  Slow onset starting about 2 years ago and slowly worsening.  He feels his R leg may give way at times.  Has tried injections with minimal success.     Red flags:  Denies BB changes and saddle anesthesia  Pertinent past history:  Hx of AFIB, L4-L5 past lumbar surgery  Pain:  Are you having pain? Yes Pain location: R sided low back pain with radiation into R foot NPRS scale:  highest 8/10 current 2/10  best 1/10 Aggravating factors: lifting more than a gallon of milk, bending, standing (30 min) Relieving factors: laying down Pain description: intermittent, constant, stabbing, and shooting Severity: high Irritability: low Stage: Chronic Stability: getting worse   Occupation: Retired  Hobbies/Recreation: plays music and singing at [place]: none   Patient Goals: avoid surgery, reduce pain   PRECAUTIONS: None  WEIGHT BEARING RESTRICTIONS No  FALLS:  Has patient fallen in last 6 months? No, Number of falls: 0  LIVING ENVIRONMENT: Lives with: lives with their spouse Stairs: Yes; Internal: 14 steps; can reach both  PLOF: Independent  DIAGNOSTIC FINDINGS:  CLINICAL DATA:  Radiculopathy, lumbar region M54.16 (ICD-10-CM)   EXAM: MRI LUMBAR SPINE WITHOUT AND WITH CONTRAST   TECHNIQUE: Multiplanar and multiecho pulse sequences of the lumbar spine were obtained without and with intravenous contrast.   CONTRAST:  31mL MULTIHANCE GADOBENATE DIMEGLUMINE 529  MG/ML IV  SOLN   COMPARISON:  December 27, 2019.   FINDINGS: Segmentation:  Standard.   Alignment:  No substantial sagittal subluxation.   Vertebrae: Vertebral body heights are maintained. No focal marrow edema to suggest acute fracture or discitis/osteomyelitis. The   Conus medullaris and cauda equina: Conus extends to the L1 level. Conus appears normal. No abnormal enhancement the conus or cauda equina nerve roots.   Paraspinal and other soft tissues: Unremarkable.   Disc levels:   T12-L1: No significant disc protrusion, foraminal stenosis, or canal stenosis.   L1-L2: No significant disc protrusion, foraminal stenosis, or canal stenosis.   L2-L3: Disc desiccation and height loss. Mild disc bulging without significant canal or foraminal stenosis. No substantial change.   L3-L4: Similar small central disc protrusion. No significant canal or foraminal stenosis. No substantial change.   L4-L5: Similar broad disc bulge with annular fissures. Postsurgical change. Bilateral facet arthropathy. Resulting mild bilateral foraminal stenosis, similar. No significant canal stenosis.   L5-S1: Mild disc bulging with central annular fissure, similar. Bilateral facet arthropathy. No significant canal or foraminal stenosis   IMPRESSION: Similar mild bilateral foraminal stenosis at L4-L5. Otherwise, no significant canal or foraminal stenosis.     Electronically Signed   By: Margaretha Sheffield M.D.   On: 04/24/2021 10:39   OBJECTIVE:   GENERAL OBSERVATION:  Lateral shift: R lateral shift in walking (minor benefit for correction)  SENSATION:  Light touch: Deficits of subjective n/t throughout lateral R LE  MUSCLE LENGTH: Hamstrings: Right limited; Left WNL  LUMBAR AROM  AROM AROM  07/26/2021  Flexion limited by 50%, w/ concordant pain  Extension limited by 50%, w/ concordant pain  Right lateral flexion limited by 25%, w/ concordant pain  Left lateral flexion limited by 25%  Right  rotation limited by 75%, w/ concordant pain  Left rotation limited by 75%, w/ concordant pain    (Blank rows = not tested)   LE MMT:  MMT Right 07/26/2021 Left 07/26/2021  Hip flexion (L2, L3)    Knee extension (L3)    Knee flexion    Hip abduction    Hip extension    Hip external rotation    Hip internal rotation    Hip adduction    Ankle dorsiflexion (L4)    Ankle plantarflexion (S1)    Ankle inversion    Ankle eversion    Great Toe ext (L5)    Grossly  3 (D)* 4   (Blank rows = not tested, score listed is out of 5 possible points.  N = WNL, D = diminished, C = clear for gross weakness with myotome testing, * = concordant pain with testing)   LE ROM:  ROM Right 07/26/2021 Left 07/26/2021  Hip flexion    Hip extension    Hip abduction    Hip adduction    Hip internal rotation    Hip external rotation    Knee flexion    Knee extension    Ankle dorsiflexion    Ankle plantarflexion    Ankle inversion    Ankle eversion      (Blank rows = not tested, N = WNL, * = concordant pain with testing)   LUMBAR SPECIAL TESTS:  Straight leg raise: L (-), R (+) Slump: L (-), R (+)   PALPATION:   TTP throughout R lumbar spine   DIRECTIONAL PREFERENCE:  Possible R lateral shift correction  FUNCTIONAL TESTS:  30'' STS:   GAIT: Antalgic with reduced time in stance  on R  PATIENT SURVEYS:  FOTO 34 -> 51    TODAY'S TREATMENT  Creating, reviewing, and competing below HEP  PATIENT EDUCATION:  POC, diagnosis, prognosis, HEP, and outcome measures.  Pt educated via explanation, demonstration, and handout (HEP).  Pt confirms understanding verbally.    HOME EXERCISE PROGRAM: Access Code: [email or web address] URL: [email or web address] Date: 07/26/2021 Prepared by: Shearon Balo  Exercises Hooklying Clamshell with Resistance - 2 x daily - 7 x weekly - 3 sets - 10 reps Supine Hip Adduction Isometric with Ball - 2 x daily - 7 x weekly - 2 sets - 10 reps - 10''  hold Seated Sciatic Tensioner - 2 x daily - 7 x weekly - 20 reps Supine Posterior Pelvic Tilt - 2 x daily - 7 x weekly - 2 sets - 10 reps - 5'' hold   ASSESSMENT:  CLINICAL IMPRESSION: Asahd is a 81 y.o. male who presents to clinic with signs and sxs consistent with R sided low back pain with radiculopathy.  Patient presents with pain and impairments/deficits in: R>L LE strength, balance, gait.  Activity limitations include: transfers, walking, standing, lifting.  Participation limitations include: ADLs involving lifting, standing, and walking.  Patient will benefit from skilled therapy to address pain and the listed deficits in order to achieve functional goals, enable safety and independence in completion of daily tasks, and return to PLOF.   REHAB POTENTIAL: Fair chronic  CLINICAL DECISION MAKING: Evolving/moderate complexity  EVALUATION COMPLEXITY: Moderate   GOALS:  SHORT TERM GOALS:  STG Name Target Date Goal status  1 Embry will be >75% HEP compliant to improve carryover between sessions and facilitate independent management of condition  Baseline: No HEP 08/16/2021 INITIAL   LONG TERM GOALS:   LTG Name Target Date Goal status  1 Tejuan will improve FOTO score from 34 (baseline) to 51 as a proxy for functional improvement 09/20/2021 INITIAL  2 Albino will improve 30'' STS (MCID 2) to >/= 4x (w/ UE?: y) to show improved LE strength and improved transfers   Baseline: 7x  w/ UE? Y 09/20/2021 INITIAL  3 Traylon will report >/= 50% decrease in pain from evaluation   Baseline: 8/10 max pain 09/20/2021 INITIAL  4 Wassim will be able to stand for 50 min while making a meal, not limited by pain   Baseline: 30 min 09/20/2021 INITIAL   PLAN: PT FREQUENCY: 1-3x/week  PT DURATION: 8 weeks (Ending 09/20/2021)  PLANNED INTERVENTIONS: Therapeutic exercises, Therapeutic activity, Neuro Muscular re-education, Gait training, Patient/Family education, Joint mobilization, Dry Needling,  Electrical stimulation, Spinal mobilization and/or manipulation, Moist heat, Taping, Vasopneumatic device, Ionotophoresis 4mg /ml Dexamethasone, and Manual therapy  PLAN FOR NEXT SESSION: see if correction of R lateral shift has an effect, core strengthening, dural stretching, possibly using the cane   Shearon Balo PT, DPT 07/26/2021, 12:57 PM

## 2021-07-26 ENCOUNTER — Ambulatory Visit: Payer: Medicare Other | Attending: Orthopaedic Surgery | Admitting: Physical Therapy

## 2021-07-26 ENCOUNTER — Other Ambulatory Visit: Payer: Self-pay

## 2021-07-26 DIAGNOSIS — M6281 Muscle weakness (generalized): Secondary | ICD-10-CM | POA: Diagnosis present

## 2021-07-26 DIAGNOSIS — R2689 Other abnormalities of gait and mobility: Secondary | ICD-10-CM | POA: Insufficient documentation

## 2021-07-26 DIAGNOSIS — G8929 Other chronic pain: Secondary | ICD-10-CM | POA: Diagnosis present

## 2021-07-26 DIAGNOSIS — M5441 Lumbago with sciatica, right side: Secondary | ICD-10-CM | POA: Diagnosis not present

## 2021-07-31 ENCOUNTER — Other Ambulatory Visit: Payer: Self-pay

## 2021-07-31 ENCOUNTER — Ambulatory Visit: Payer: Medicare Other

## 2021-07-31 DIAGNOSIS — M6281 Muscle weakness (generalized): Secondary | ICD-10-CM

## 2021-07-31 DIAGNOSIS — G8929 Other chronic pain: Secondary | ICD-10-CM

## 2021-07-31 DIAGNOSIS — M5441 Lumbago with sciatica, right side: Secondary | ICD-10-CM | POA: Diagnosis not present

## 2021-07-31 DIAGNOSIS — R2689 Other abnormalities of gait and mobility: Secondary | ICD-10-CM

## 2021-07-31 NOTE — Therapy (Signed)
OUTPATIENT PHYSICAL THERAPY TREATMENT NOTE   Patient Name: Kent [name] MRN: [id number] DOB:February 24, 1941, 81 y.o., male Today's Date: 07/31/2021  PCP: Shirline Frees, MD REFERRING PROVIDER: Starling Manns, MD   PT End of Session - 07/31/21 1355     Visit Number 2    Number of Visits 16    Date for PT Re-Evaluation 09/20/21    Authorization Type MCR    PT Start Time 1355    PT Stop Time 1440    PT Time Calculation (min) 45 min    Activity Tolerance Patient tolerated treatment well    Behavior During Therapy [facility] for tasks assessed/performed             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       Cancer ([place])    basal cell cancer - skin   Chronic back pain    s/p back surgery   GERD (gastroesophageal reflux disease) 08/18/2013   Incisional hernia, without obstruction or gangrene 02/02/2013   Postural dizziness with presyncope 09/01/2016   Sinus infection    currently on antibiotics   Small bowel obstruction due to adhesions [facility])    Past Surgical History:  Procedure Laterality Date   BACK SURGERY  08/25/2001   Neck 3 disk replacements   BACK SURGERY     Placement/removal of nerve stimulator   CHOLECYSTECTOMY N/A 10/16/2014   Procedure: LAPAROSCOPIC CHOLECYSTECTOMY WITH ATTEMPTED CHOLANGIOGRAM;  Surgeon: Georganna Skeans, MD;  Location: [place];  Service: General;  Laterality: N/A;   COLON SURGERY  04/2002   Dr. Deon Pilling - Subtotal colectomy, ileostomy   ELBOW SURGERY Right    for blood vessel & " hung" nerve   ESOPHAGEAL DILATION     EXPLORATORY LAPAROTOMY  03/10/2017   HERNIA REPAIR     Ileostomy Takedown  08/2002   Dr. Deon Pilling   INCISION AND DRAINAGE FOOT     INCISIONAL HERNIA REPAIR N/A 01/20/2013   Procedure: HERNIA REPAIR INCISIONAL;  Surgeon: Zenovia Jarred, MD;  Location: [place];  Service: General;  Laterality: N/A;   [place]  04/13/2014   with mesh    dr Alene Mires HERNIA REPAIR N/A 04/13/2014    Procedure: OPEN REPAIR INCISIONAL HERNIA ;  Surgeon: Georganna Skeans, MD;  Location: [place];  Service: General;  Laterality: N/A;   INSERTION OF MESH N/A 04/13/2014   Procedure: INSERTION OF MESH;  Surgeon: Georganna Skeans, MD;  Location: [place];  Service: General;  Laterality: N/A;   KNEE ARTHROSCOPY Right 2010   LAPAROSCOPIC CHOLECYSTECTOMY  10/16/2014   LAPAROTOMY N/A 01/20/2013   Procedure: EXPLORATORY LAPAROTOMY;  Surgeon: Zenovia Jarred, MD;  Location: [place];  Service: General;  Laterality: N/A;   LAPAROTOMY N/A 03/10/2017   Procedure: EXPLORATORY LAPAROTOMY;  Surgeon: Georganna Skeans, MD;  Location: [place];  Service: General;  Laterality: N/A;   Laparotomy with lysis of adhesions  2010   Dr. Grandville Silos - SBO   LYSIS OF ADHESION N/A 01/20/2013   Procedure: LYSIS OF ADHESION;  Surgeon: Zenovia Jarred, MD;  Location: [place];  Service: General;  Laterality: N/A;   LYSIS OF ADHESION N/A 03/10/2017   Procedure: LYSIS OF ADHESION;  Surgeon: Georganna Skeans, MD;  Location: [place];  Service: General;  Laterality: N/A;   microsurgical  03/24/2002   Lateral recess decompression L5   SACRAL NERVE STIMULATOR PLACEMENT     inserted and removed   SHOULDER  ARTHROSCOPY Bilateral    VASECTOMY     Patient Active Problem List   Diagnosis Date Noted   PAF (paroxysmal atrial fibrillation) ([place]) 02/26/2019   S/P exploratory laparotomy 03/10/2017   Elevated blood pressure reading 12/30/2016   Nonspecific abnormal electrocardiogram (ECG) (EKG) 09/01/2016   History of colectomy 02/13/2016   Intractable abdominal pain 02/13/2016   S/P laparoscopic cholecystectomy 10/16/2014   S/P hernia repair 04/13/2014   Pre-op evaluation 03/24/2014   H/O abdominal surgery 08/18/2013   GERD (gastroesophageal reflux disease) 08/18/2013   Chronic back pain 08/18/2013   SBO (small bowel obstruction) ([place]) 08/12/2013    REFERRING DIAG: Other spondylosis, lumbosacral region [M47.897]  THERAPY DIAG:  Chronic right-sided low  back pain with right-sided sciatica  Muscle weakness  Other abnormalities of gait and mobility  PERTINENT HISTORY: Hx of AFIB, L4-L5 past lumbar surgery  PRECAUTIONS: None  ONSET DATE: >2 years  SUBJECTIVE: My back has really been hurting today. I haven't done my exercises at home because I have to be really careful to not hurt my back.   PAIN:  Are you having pain? Yes Pain location: R sided low back pain with radiation into R foot NPRS scale:  current 5/10  Aggravating factors: lifting more than a gallon of milk, bending, standing (30 min) Relieving factors: laying down Pain description: intermittent, constant, stabbing, and shooting Severity: high Irritability: low Stage: Chronic Stability: getting worse    OBJECTIVE:    GENERAL OBSERVATION:           Lateral shift: R lateral shift in walking (minor benefit for correction)   SENSATION:          Light touch: Deficits of subjective n/t throughout lateral R LE   MUSCLE LENGTH: Hamstrings: Right limited; Left WNL   LUMBAR AROM   AROM AROM  07/26/2021  Flexion limited by 50%, w/ concordant pain  Extension limited by 50%, w/ concordant pain  Right lateral flexion limited by 25%, w/ concordant pain  Left lateral flexion limited by 25%  Right rotation limited by 75%, w/ concordant pain  Left rotation limited by 75%, w/ concordant pain    (Blank rows = not tested)     LE MMT:   MMT Right 07/26/2021 Left 07/26/2021  Hip flexion (L2, L3)      Knee extension (L3)      Knee flexion      Hip abduction      Hip extension      Hip external rotation      Hip internal rotation      Hip adduction      Ankle dorsiflexion (L4)      Ankle plantarflexion (S1)      Ankle inversion      Ankle eversion      Great Toe ext (L5)      Grossly  3 (D)* 4    (Blank rows = not tested, score listed is out of 5 possible points.  N = WNL, D = diminished, C = clear for gross weakness with myotome testing, * = concordant pain with  testing)     LE ROM:   ROM Right 07/26/2021 Left 07/26/2021  Hip flexion      Hip extension      Hip abduction      Hip adduction      Hip internal rotation      Hip external rotation      Knee flexion      Knee extension  Ankle dorsiflexion      Ankle plantarflexion      Ankle inversion      Ankle eversion         (Blank rows = not tested, N = WNL, * = concordant pain with testing)    LUMBAR SPECIAL TESTS:  Straight leg raise: L (-), R (+) Slump: L (-), R (+)    PALPATION:            TTP throughout R lumbar spine    DIRECTIONAL PREFERENCE:           Possible R lateral shift correction   FUNCTIONAL TESTS:  30'' STS:    GAIT: Antalgic with reduced time in stance on R   PATIENT SURVEYS:  FOTO 34 -> 51       TODAY'S TREATMENT  [facility] Adult PT Treatment:                                                DATE: 07/31/2021 Therapeutic Exercise: Nustep level 4 x 5 mins Hooklying clamshell GTB 2 x10 Bridge x 10 LTR x10 BIL Ball roll outs blue PB forward/side x10 each Seated march x10 BIL STS x 10 Step ups forward/lateral 4" step x 10 each Mini squats with UE support x 10 Neuromuscular re-ed: Romberg stance with head turns x 30 sec Romberg stance with head nods x 30 sec SLS with UE support x 30 sec BIL Romberg stance with eyes closed L x 30 sec, R x 15 sec (LOB)    [facility] Adult PT Treatment:                                                DATE: 07/26/2021 Creating, reviewing, and competing below HEP    PATIENT EDUCATION:  Assessed HEP (non compliant) POC, diagnosis, prognosis, HEP, and outcome measures.  Pt educated via explanation, demonstration, and handout (HEP).  Pt confirms understanding verbally.      HOME EXERCISE PROGRAM: Access Code: [email or web address] URL: [email or web address] Date: 07/31/2021 Prepared by: [place] with Resistance - 2 x daily - 7 x weekly - 3 sets - 10 reps Supine Hip Adduction Isometric  with Ball - 2 x daily - 7 x weekly - 2 sets - 10 reps - 10'' hold Seated Sciatic Tensioner - 2 x daily - 7 x weekly - 20 reps Supine Posterior Pelvic Tilt - 2 x daily - 7 x weekly - 2 sets - 10 reps - 5'' hold Added 07/31/2021 Supine Bridge - 1 x daily - 7 x weekly - 3 sets - 10 reps      ASSESSMENT:   CLINICAL IMPRESSION: Patient presents to PT with low back pain and radicular symptoms. Patient remains energetic throughout session and has good participation in exercises. Patient reports he has not been compliant with his HEP due to high pain levels and fear of re-injuring his back. Pt wearing low back brace throughout appointment noting he feels safer with it on. Pt exhibits fear avoidance behavior with HEP non-compliance and slow, methodical movements throughout exercise. He expressed interest in more balance training so avoid falling. Patient continues to benefit from skilled PT services and should be progressed as able to improve functional  independence.       REHAB POTENTIAL: Fair chronic   CLINICAL DECISION MAKING: Evolving/moderate complexity   EVALUATION COMPLEXITY: Moderate     GOALS:   SHORT TERM GOALS:   STG Name Target Date Goal status  1 Jak will be >75% HEP compliant to improve carryover between sessions and facilitate independent management of condition   Baseline: No HEP 08/16/2021 INITIAL    LONG TERM GOALS:    LTG Name Target Date Goal status  1 Tequan will improve FOTO score from 34 (baseline) to 51 as a proxy for functional improvement 09/20/2021 INITIAL  2 Tetsuo will improve 30'' STS (MCID 2) to >/= 4x (w/ UE?: y) to show improved LE strength and improved transfers    Baseline: 7x  w/ UE? Y 09/20/2021 INITIAL  3 [place]illion will report >/= 50% decrease in pain from evaluation    Baseline: 8/10 max pain 09/20/2021 INITIAL  4 Kindred will be able to stand for 50 min while making a meal, not limited by pain    Baseline: 30 min 09/20/2021 INITIAL    PLAN: PT  FREQUENCY: 1-3x/week   PT DURATION: 8 weeks (Ending 09/20/2021)   PLANNED INTERVENTIONS: Therapeutic exercises, Therapeutic activity, Neuro Muscular re-education, Gait training, Patient/Family education, Joint mobilization, Dry Needling, Electrical stimulation, Spinal mobilization and/or manipulation, Moist heat, Taping, Vasopneumatic device, Ionotophoresis 4mg /ml Dexamethasone, and Manual therapy   PLAN FOR NEXT SESSION: see if correction of R lateral shift has an effect, core strengthening, dural stretching, possibly using the cane, check for HEP compliance    Evelene Croon, PTA 07/31/2021, 2:43 PM

## 2021-08-02 ENCOUNTER — Other Ambulatory Visit: Payer: Self-pay

## 2021-08-02 ENCOUNTER — Ambulatory Visit: Payer: Medicare Other

## 2021-08-02 DIAGNOSIS — G8929 Other chronic pain: Secondary | ICD-10-CM

## 2021-08-02 DIAGNOSIS — M5441 Lumbago with sciatica, right side: Secondary | ICD-10-CM | POA: Diagnosis not present

## 2021-08-02 DIAGNOSIS — M6281 Muscle weakness (generalized): Secondary | ICD-10-CM

## 2021-08-02 DIAGNOSIS — R2689 Other abnormalities of gait and mobility: Secondary | ICD-10-CM

## 2021-08-02 NOTE — Therapy (Signed)
OUTPATIENT PHYSICAL THERAPY TREATMENT NOTE   Patient Name: Kent [name] MRN: [id number] DOB:February 12, 1941, 81 y.o., male Today's Date: 08/02/2021  PCP: Shirline Frees, MD REFERRING PROVIDER: Starling Manns, MD   PT End of Session - 08/02/21 1209     Visit Number 3    Number of Visits 16    Date for PT Re-Evaluation 09/20/21    Authorization Type MCR    PT Start Time 1210    PT Stop Time 1252    PT Time Calculation (min) 42 min    Activity Tolerance Patient tolerated treatment well    Behavior During Therapy [facility] for tasks assessed/performed              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)    Past Surgical History:  Procedure Laterality Date   BACK SURGERY  08/25/2001   Neck 3 disk replacements   BACK SURGERY     Placement/removal of nerve stimulator   CHOLECYSTECTOMY N/A 10/16/2014   Procedure: LAPAROSCOPIC CHOLECYSTECTOMY WITH ATTEMPTED CHOLANGIOGRAM;  Surgeon: Georganna Skeans, MD;  Location: [place];  Service: General;  Laterality: N/A;   COLON SURGERY  04/2002   Dr. Deon Pilling - Subtotal colectomy, ileostomy   ELBOW SURGERY Right    for blood vessel & " hung" nerve   ESOPHAGEAL DILATION     EXPLORATORY LAPAROTOMY  03/10/2017   HERNIA REPAIR     Ileostomy Takedown  08/2002   Dr. Deon Pilling   INCISION AND DRAINAGE FOOT     INCISIONAL HERNIA REPAIR N/A 01/20/2013   Procedure: HERNIA REPAIR INCISIONAL;  Surgeon: Zenovia Jarred, MD;  Location: [place];  Service: General;  Laterality: N/A;   [place]  04/13/2014   with mesh    dr Alene Mires HERNIA REPAIR N/A 04/13/2014    Procedure: OPEN REPAIR INCISIONAL HERNIA ;  Surgeon: Georganna Skeans, MD;  Location: [place];  Service: General;  Laterality: N/A;   INSERTION OF MESH N/A 04/13/2014   Procedure: INSERTION OF MESH;  Surgeon: Georganna Skeans, MD;  Location: [place];  Service: General;  Laterality: N/A;   KNEE ARTHROSCOPY Right 2010   LAPAROSCOPIC CHOLECYSTECTOMY  10/16/2014   LAPAROTOMY N/A 01/20/2013   Procedure: EXPLORATORY LAPAROTOMY;  Surgeon: Zenovia Jarred, MD;  Location: [place];  Service: General;  Laterality: N/A;   LAPAROTOMY N/A 03/10/2017   Procedure: EXPLORATORY LAPAROTOMY;  Surgeon: Georganna Skeans, MD;  Location: [place];  Service: General;  Laterality: N/A;   Laparotomy with lysis of adhesions  2010   Dr. Grandville Silos - SBO   LYSIS OF ADHESION N/A 01/20/2013   Procedure: LYSIS OF ADHESION;  Surgeon: Zenovia Jarred, MD;  Location: [place];  Service: General;  Laterality: N/A;   LYSIS OF ADHESION N/A 03/10/2017   Procedure: LYSIS OF ADHESION;  Surgeon: Georganna Skeans, MD;  Location: [place];  Service: General;  Laterality: N/A;   microsurgical  03/24/2002   Lateral recess decompression L5   SACRAL NERVE STIMULATOR PLACEMENT     inserted and removed  SHOULDER ARTHROSCOPY Bilateral    VASECTOMY     Patient Active Problem List   Diagnosis Date Noted   PAF (paroxysmal atrial fibrillation) ([place]) 02/26/2019   S/P exploratory laparotomy 03/10/2017   Elevated blood pressure reading 12/30/2016   Nonspecific abnormal electrocardiogram (ECG) (EKG) 09/01/2016   History of colectomy 02/13/2016   Intractable abdominal pain 02/13/2016   S/P laparoscopic cholecystectomy 10/16/2014   S/P hernia repair 04/13/2014   Pre-op evaluation 03/24/2014   H/O abdominal surgery 08/18/2013   GERD (gastroesophageal reflux disease) 08/18/2013   Chronic back pain 08/18/2013   SBO (small bowel obstruction) ([place]) 08/12/2013    REFERRING DIAG: Other spondylosis, lumbosacral region [M47.897]  THERAPY DIAG:  Chronic right-sided low  back pain with right-sided sciatica  Muscle weakness  Other abnormalities of gait and mobility  PERTINENT HISTORY: Hx of AFIB, L4-L5 past lumbar surgery  PRECAUTIONS: None  ONSET DATE: >2 years  SUBJECTIVE:  I'm pretty sore today, I've been doing my exercises.  PAIN:  Are you having pain? Yes Pain location: R sided low back pain with radiation into R foot NPRS scale:  current 4/10  Aggravating factors: lifting more than a gallon of milk, bending, standing (30 min) Relieving factors: laying down Pain description: intermittent, constant, stabbing, and shooting Severity: high Irritability: low Stage: Chronic Stability: getting worse    OBJECTIVE:    GENERAL OBSERVATION:           Lateral shift: R lateral shift in walking (minor benefit for correction)   SENSATION:          Light touch: Deficits of subjective n/t throughout lateral R LE   MUSCLE LENGTH: Hamstrings: Right limited; Left WNL   LUMBAR AROM   AROM AROM  07/26/2021  Flexion limited by 50%, w/ concordant pain  Extension limited by 50%, w/ concordant pain  Right lateral flexion limited by 25%, w/ concordant pain  Left lateral flexion limited by 25%  Right rotation limited by 75%, w/ concordant pain  Left rotation limited by 75%, w/ concordant pain    (Blank rows = not tested)     LE MMT:   MMT Right 07/26/2021 Left 07/26/2021  Hip flexion (L2, L3)      Knee extension (L3)      Knee flexion      Hip abduction      Hip extension      Hip external rotation      Hip internal rotation      Hip adduction      Ankle dorsiflexion (L4)      Ankle plantarflexion (S1)      Ankle inversion      Ankle eversion      Great Toe ext (L5)      Grossly  3 (D)* 4    (Blank rows = not tested, score listed is out of 5 possible points.  N = WNL, D = diminished, C = clear for gross weakness with myotome testing, * = concordant pain with testing)     LE ROM:   ROM Right 07/26/2021 Left 07/26/2021  Hip flexion       Hip extension      Hip abduction      Hip adduction      Hip internal rotation      Hip external rotation      Knee flexion      Knee extension      Ankle dorsiflexion      Ankle plantarflexion  Ankle inversion      Ankle eversion         (Blank rows = not tested, N = WNL, * = concordant pain with testing)    LUMBAR SPECIAL TESTS:  Straight leg raise: L (-), R (+) Slump: L (-), R (+)    PALPATION:            TTP throughout R lumbar spine    DIRECTIONAL PREFERENCE:           Possible R lateral shift correction   FUNCTIONAL TESTS:  24'' STS:    GAIT: Antalgic with reduced time in stance on R   PATIENT SURVEYS:  FOTO 34 -> 51       TODAY'S TREATMENT  [facility] Adult PT Treatment:                                                DATE: 08/02/2021 Therapeutic Exercise: Nustep level 5 x 5 mins Step ups forward/lateral 4" step x 10 each Mini squats with UE support x 10 LAQ x 10 BIL Hooklying clamshell GTB 2 x10 Supine hip adduction ball squeeze 2 x 10 Bridge 2 x 10 LTR x10 BIL Ball roll outs blue PB forward/side x10 each Seated march 2# x10 BIL STS x 10 Neuromuscular re-ed: Romberg stance with head turns x 30 sec Romberg stance eyes closed 2 x 30 sec Tandem stance eyes open x 30 sec ea SLS x 20 sec L, x 10 sec R before LOB   [facility] Adult PT Treatment:                                                DATE: 07/31/2021 Therapeutic Exercise: Nustep level 4 x 5 mins Hooklying clamshell GTB 2 x10 Bridge x 10 LTR x10 BIL Ball roll outs blue PB forward/side x10 each Seated march x10 BIL STS x 10 Step ups forward/lateral 4" step x 10 each Mini squats with UE support x 10 Neuromuscular re-ed: Romberg stance with head turns x 30 sec Romberg stance with head nods x 30 sec SLS with UE support x 30 sec BIL Romberg stance with eyes closed L x 30 sec, R x 15 sec (LOB)    [facility] Adult PT Treatment:                                                DATE: 07/26/2021 Creating,  reviewing, and competing below HEP    PATIENT EDUCATION:  Assessed HEP POC, diagnosis, prognosis, HEP, and outcome measures.  Pt educated via explanation, demonstration, and handout (HEP).  Pt confirms understanding verbally.      HOME EXERCISE PROGRAM: Access Code: [email or web address] URL: [email or web address] Date: 07/31/2021 Prepared by: [place] with Resistance - 2 x daily - 7 x weekly - 3 sets - 10 reps Supine Hip Adduction Isometric with Ball - 2 x daily - 7 x weekly - 2 sets - 10 reps - 10'' hold Seated Sciatic Tensioner - 2 x daily - 7 x weekly - 20 reps Supine Posterior Pelvic Tilt - 2 x daily -  7 x weekly - 2 sets - 10 reps - 5'' hold Added 07/31/2021 Supine Bridge - 1 x daily - 7 x weekly - 3 sets - 10 reps      ASSESSMENT:   CLINICAL IMPRESSION: Patient reports HEP compliance with increased muscle soreness today, he states he has an area in his house to do his exercises now so he should continue to remain compliant. Patient reports burning pain on lateral R thigh and lateral R knee at end of session and states this often happens after lying supine for an extended period of time. He demonstrates good participation in exercises throughout session with no adverse affects. Continue strengthening core, LE's, and lower back and progressing balance exercises. Patient continues to benefit from skilled PT services and should be progressed as able to improve functional independence.    REHAB POTENTIAL: Fair chronic   CLINICAL DECISION MAKING: Evolving/moderate complexity   EVALUATION COMPLEXITY: Moderate     GOALS:   SHORT TERM GOALS:   STG Name Target Date Goal status  1 Jonatan will be >75% HEP compliant to improve carryover between sessions and facilitate independent management of condition   Baseline: No HEP 08/16/2021 INITIAL    LONG TERM GOALS:    LTG Name Target Date Goal status  1 Cordarrel will improve FOTO score from 34  (baseline) to 51 as a proxy for functional improvement 09/20/2021 INITIAL  2 Keiandre will improve 30'' STS (MCID 2) to >/= 4x (w/ UE?: y) to show improved LE strength and improved transfers    Baseline: 7x  w/ UE? Y 09/20/2021 INITIAL  3 Naoki will report >/= 50% decrease in pain from evaluation    Baseline: 8/10 max pain 09/20/2021 INITIAL  4 Koal will be able to stand for 50 min while making a meal, not limited by pain    Baseline: 30 min 09/20/2021 INITIAL    PLAN: PT FREQUENCY: 1-3x/week   PT DURATION: 8 weeks (Ending 09/20/2021)   PLANNED INTERVENTIONS: Therapeutic exercises, Therapeutic activity, Neuro Muscular re-education, Gait training, Patient/Family education, Joint mobilization, Dry Needling, Electrical stimulation, Spinal mobilization and/or manipulation, Moist heat, Taping, Vasopneumatic device, Ionotophoresis 4mg /ml Dexamethasone, and Manual therapy   PLAN FOR NEXT SESSION: Hamstring stretch, see if correction of R lateral shift has an effect, core strengthening, dural stretching, possibly using the cane, assess HEP     Campbell Soup, PTA 08/02/2021, 1:04 PM

## 2021-08-03 ENCOUNTER — Ambulatory Visit: Payer: Medicare Other

## 2021-08-03 DIAGNOSIS — M6281 Muscle weakness (generalized): Secondary | ICD-10-CM

## 2021-08-03 DIAGNOSIS — M5441 Lumbago with sciatica, right side: Secondary | ICD-10-CM

## 2021-08-03 DIAGNOSIS — G8929 Other chronic pain: Secondary | ICD-10-CM

## 2021-08-03 DIAGNOSIS — R2689 Other abnormalities of gait and mobility: Secondary | ICD-10-CM

## 2021-08-03 NOTE — Therapy (Signed)
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       Cancer ([place])    basal cell cancer - skin   Chronic back pain    s/p back surgery   GERD (gastroesophageal reflux disease) 08/18/2013   Incisional hernia, without obstruction or gangrene 02/02/2013   Postural dizziness with presyncope 09/01/2016   Sinus infection    currently on antibiotics   Small bowel obstruction due to adhesions [facility])    Past Surgical History:  Procedure Laterality Date   BACK SURGERY  08/25/2001   Neck 3 disk replacements   BACK SURGERY     Placement/removal of nerve stimulator   CHOLECYSTECTOMY N/A 10/16/2014   Procedure: LAPAROSCOPIC CHOLECYSTECTOMY WITH ATTEMPTED CHOLANGIOGRAM;  Surgeon: Georganna Skeans, MD;  Location: [place];  Service: General;  Laterality: N/A;   COLON SURGERY  04/2002   Dr. Deon Pilling - Subtotal colectomy, ileostomy   ELBOW SURGERY Right    for blood vessel & " hung" nerve   ESOPHAGEAL DILATION     EXPLORATORY LAPAROTOMY  03/10/2017   HERNIA REPAIR     Ileostomy Takedown  08/2002   Dr. Deon Pilling   INCISION AND DRAINAGE FOOT     INCISIONAL HERNIA REPAIR N/A 01/20/2013   Procedure: HERNIA REPAIR INCISIONAL;  Surgeon: Zenovia Jarred, MD;  Location: [place];  Service: General;  Laterality: N/A;   [place]  04/13/2014   with mesh    dr Alene Mires HERNIA REPAIR N/A 04/13/2014    Procedure: OPEN REPAIR INCISIONAL HERNIA ;  Surgeon: Georganna Skeans, MD;  Location: [place];  Service: General;  Laterality: N/A;   INSERTION OF MESH N/A 04/13/2014   Procedure: INSERTION OF MESH;  Surgeon: Georganna Skeans, MD;  Location: [place];  Service: General;  Laterality: N/A;   KNEE ARTHROSCOPY Right 2010   LAPAROSCOPIC CHOLECYSTECTOMY  10/16/2014   LAPAROTOMY N/A 01/20/2013   Procedure: EXPLORATORY LAPAROTOMY;  Surgeon: Zenovia Jarred, MD;  Location: [place];  Service: General;  Laterality: N/A;   LAPAROTOMY N/A 03/10/2017   Procedure: EXPLORATORY LAPAROTOMY;  Surgeon: Georganna Skeans, MD;  Location: [place];  Service: General;  Laterality: N/A;   Laparotomy with lysis of adhesions  2010   Dr. Grandville Silos - SBO   LYSIS OF ADHESION N/A 01/20/2013   Procedure: LYSIS OF ADHESION;  Surgeon: Zenovia Jarred, MD;  Location: [place];  Service: General;  Laterality: N/A;   LYSIS OF ADHESION N/A 03/10/2017   Procedure: LYSIS OF ADHESION;  Surgeon: Georganna Skeans, MD;  Location: [place];  Service: General;  Laterality: N/A;   microsurgical  03/24/2002   Lateral recess decompression L5   SACRAL NERVE STIMULATOR PLACEMENT     inserted and removed  SHOULDER ARTHROSCOPY Bilateral    VASECTOMY     Patient Active Problem List   Diagnosis Date Noted   PAF (paroxysmal atrial fibrillation) ([place]) 02/26/2019   S/P exploratory laparotomy 03/10/2017   Elevated blood pressure reading 12/30/2016   Nonspecific abnormal electrocardiogram (ECG) (EKG) 09/01/2016   History of colectomy 02/13/2016   Intractable abdominal pain 02/13/2016   S/P laparoscopic cholecystectomy 10/16/2014   S/P hernia repair 04/13/2014   Pre-op evaluation 03/24/2014   H/O abdominal surgery 08/18/2013   GERD (gastroesophageal reflux disease) 08/18/2013   Chronic back pain 08/18/2013   SBO (small bowel obstruction) ([place]) 08/12/2013    REFERRING DIAG: Other spondylosis, lumbosacral region [M47.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 I'd like to try therapy today without my brace on.   PAIN:  Are you having pain? Yes Pain location: R sided low back pain with radiation into R foot NPRS scale:  current 3/10  Aggravating factors: lifting more than a gallon of milk, bending, standing (30 min) Relieving factors: laying down Pain description: intermittent, constant, stabbing, and shooting Severity: high Irritability: low Stage: Chronic Stability: getting worse    OBJECTIVE:    GENERAL OBSERVATION:           Lateral shift: R lateral shift in walking (minor benefit for correction)   SENSATION:          Light touch: Deficits of subjective n/t throughout lateral R LE   MUSCLE LENGTH: Hamstrings: Right limited; Left WNL   LUMBAR AROM   AROM AROM  07/26/2021  Flexion limited by 50%, w/ concordant pain  Extension limited by 50%, w/ concordant pain  Right lateral flexion limited by 25%, w/ concordant pain  Left lateral flexion limited by 25%  Right rotation limited by 75%, w/ concordant pain  Left rotation limited by 75%, w/ concordant pain    (Blank rows = not tested)     LE MMT:   MMT Right 07/26/2021 Left 07/26/2021  Hip flexion (L2, L3)      Knee extension (L3)      Knee flexion      Hip abduction      Hip extension      Hip external rotation      Hip internal rotation      Hip adduction      Ankle dorsiflexion (L4)      Ankle plantarflexion (S1)      Ankle inversion      Ankle eversion      Great Toe ext (L5)      Grossly  3 (D)* 4    (Blank rows = not tested, score listed is out of 5 possible points.  N = WNL, D = diminished, C = clear for gross weakness with myotome testing, * = concordant pain with testing)     LE ROM:   ROM Right 07/26/2021  Left 07/26/2021  Hip flexion      Hip extension      Hip abduction      Hip adduction      Hip internal rotation      Hip external rotation      Knee flexion      Knee extension      Ankle dorsiflexion  Ankle plantarflexion      Ankle inversion      Ankle eversion         (Blank rows = not tested, N = WNL, * = concordant pain with testing)    LUMBAR SPECIAL TESTS:  Straight leg raise: L (-), R (+) Slump: L (-), R (+)    PALPATION:            TTP throughout R lumbar spine    DIRECTIONAL PREFERENCE:           Possible R lateral shift correction   FUNCTIONAL TESTS:  30'' STS: 7 reps with UE   GAIT: Antalgic with reduced time in stance on R   PATIENT SURVEYS:  FOTO 34 -> 51       TODAY'S TREATMENT  [facility] Adult PT Treatment:                                                DATE: 08/03/2021 Therapeutic Exercise: Nustep level 5 x 5 mins Mini squats with UE support x 10, verbal cues for proper form Ball roll outs blue PB forward/side x10 each Seated march 2# 2 x10 BIL LAQ 2# x 10 BIL Hooklying clamshell GTB 2 x10 Supine hip adduction ball squeeze 2 x 10 Bridge x 10 Supine hamstring stretch with strap x 30 sec BIL LTR x10 BIL Neuromuscular re-ed: Tandem stance eyes closed x 30 sec ea SLS x 30 sec L, x 25 sec R before LOB SLS cone taps with single UE support 2 x 30 sec BIL   [facility] Adult PT Treatment:                                                DATE: 08/02/2021 Therapeutic Exercise: Nustep level 5 x 5 mins Step ups forward/lateral 4" step x 10 each Mini squats with UE support x 10 LAQ x 10 BIL Hooklying clamshell GTB 2 x10 Supine hip adduction ball squeeze 2 x 10 Bridge 2 x 10 LTR x10 BIL Ball roll outs blue PB forward/side x10 each Seated march 2# x10 BIL STS x 10 Neuromuscular re-ed: Romberg stance with head turns x 30 sec Romberg stance eyes closed 2 x 30 sec Tandem stance eyes open x 30 sec ea SLS x 20 sec L, x 10 sec R before LOB   [facility] Adult PT  Treatment:                                                DATE: 07/31/2021 Therapeutic Exercise: Nustep level 4 x 5 mins Hooklying clamshell GTB 2 x10 Bridge x 10 LTR x10 BIL Ball roll outs blue PB forward/side x10 each Seated march x10 BIL STS x 10 Step ups forward/lateral 4" step x 10 each Mini squats with UE support x 10 Neuromuscular re-ed: Romberg stance with head turns x 30 sec Romberg stance with head nods x 30 sec SLS with UE support x 30 sec BIL Romberg stance with eyes closed L x 30 sec, R x 15 sec (LOB)    PATIENT EDUCATION:  Assessed HEP, POC, diagnosis,  prognosis, HEP, and outcome measures.  Pt educated via explanation, demonstration, and handout (HEP).  Pt confirms understanding verbally.      HOME EXERCISE PROGRAM: Access Code: [email or web address] URL: [email or web address] Date: 07/31/2021 Prepared by: [place] with Resistance - 2 x daily - 7 x weekly - 3 sets - 10 reps Supine Hip Adduction Isometric with Ball - 2 x daily - 7 x weekly - 2 sets - 10 reps - 10'' hold Seated Sciatic Tensioner - 2 x daily - 7 x weekly - 20 reps Supine Posterior Pelvic Tilt - 2 x daily - 7 x weekly - 2 sets - 10 reps - 5'' hold Added 07/31/2021 Supine Bridge - 1 x daily - 7 x weekly - 3 sets - 10 reps      ASSESSMENT:   CLINICAL IMPRESSION: Patient reports to PT with muscle soreness from yesterdays session, but decreased overall pain. He has expressed interest in beginning a walking program. He was able to complete all given exercises with good participation and without adverse affect. He reports he felt like exercises were easier today without the brace on. Continue focus on strengthening core, LE's, lower back and progressing balance exercises. Patient continues to benefit from skilled PT services and should be progressed as able to improve functional independence.    REHAB POTENTIAL: Fair chronic   CLINICAL DECISION MAKING:  Evolving/moderate complexity   EVALUATION COMPLEXITY: Moderate     GOALS:   SHORT TERM GOALS:   STG Name Target Date Goal status  1 Kent [name] will be >75% HEP compliant to improve carryover between sessions and facilitate independent management of condition   Baseline: No HEP 08/16/2021 INITIAL    LONG TERM GOALS:    LTG Name Target Date Goal status  1 Kent [name] will improve FOTO score from 34 (baseline) to 51 as a proxy for functional improvement 09/20/2021 INITIAL  2 Kent [name] will improve 30'' STS (MCID 2) to >/= 4x (w/ UE?: y) to show improved LE strength and improved transfers    Baseline: 7x  w/ UE? Y 09/20/2021 INITIAL  3 Kent [name] will report >/= 50% decrease in pain from evaluation    Baseline: 8/10 max pain 09/20/2021 INITIAL  4 Kent [name] will be able to stand for 50 min while making a meal, not limited by pain    Baseline: 30 min 09/20/2021 INITIAL    PLAN: PT FREQUENCY: 1-3x/week   PT DURATION: 8 weeks (Ending 09/20/2021)   PLANNED INTERVENTIONS: Therapeutic exercises, Therapeutic activity, Neuro Muscular re-education, Gait training, Patient/Family education, Joint mobilization, Dry Needling, Electrical stimulation, Spinal mobilization and/or manipulation, Moist heat, Taping, Vasopneumatic device, Ionotophoresis 4mg /ml Dexamethasone, and Manual therapy   PLAN FOR NEXT SESSION: Walking program information, see if correction of R lateral shift has an effect, core strengthening, dural stretching, possibly using the cane, assess HEP     Campbell Soup, PTA 08/03/2021, 11:05 AM

## 2021-08-05 ENCOUNTER — Ambulatory Visit: Payer: Medicare Other

## 2021-08-05 ENCOUNTER — Other Ambulatory Visit: Payer: Self-pay

## 2021-08-05 DIAGNOSIS — M6281 Muscle weakness (generalized): Secondary | ICD-10-CM

## 2021-08-05 DIAGNOSIS — G8929 Other chronic pain: Secondary | ICD-10-CM

## 2021-08-05 DIAGNOSIS — M5441 Lumbago with sciatica, right side: Secondary | ICD-10-CM | POA: Diagnosis not present

## 2021-08-05 NOTE — Therapy (Signed)
OUTPATIENT PHYSICAL THERAPY TREATMENT NOTE   Patient Name: Kent [name] MRN: [id number] DOB:12-17-1940, 81 y.o., male Today's Date: 08/05/2021  PCP: Shirline Frees, MD REFERRING PROVIDER: Starling Manns, MD   PT End of Session - 08/05/21 1215     Visit Number 5    Number of Visits 16    Date for PT Re-Evaluation 09/20/21    Authorization Type MCR    PT Start Time 1215    PT Stop Time 1255    PT Time Calculation (min) 40 min    Activity Tolerance Patient tolerated treatment well    Behavior During Therapy [facility] for tasks assessed/performed                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       Cancer ([place])    basal cell cancer - skin   Chronic back pain    s/p back surgery   GERD (gastroesophageal reflux disease) 08/18/2013   Incisional hernia, without obstruction or gangrene 02/02/2013   Postural dizziness with presyncope 09/01/2016   Sinus infection    currently on antibiotics   Small bowel obstruction due to adhesions [facility])    Past Surgical History:  Procedure Laterality Date   BACK SURGERY  08/25/2001   Neck 3 disk replacements   BACK SURGERY     Placement/removal of nerve stimulator   CHOLECYSTECTOMY N/A 10/16/2014   Procedure: LAPAROSCOPIC CHOLECYSTECTOMY WITH ATTEMPTED CHOLANGIOGRAM;  Surgeon: Georganna Skeans, MD;  Location: [place];  Service: General;  Laterality: N/A;   COLON SURGERY  04/2002   Dr. Deon Pilling - Subtotal colectomy, ileostomy   ELBOW SURGERY Right    for blood vessel & " hung" nerve   ESOPHAGEAL DILATION     EXPLORATORY LAPAROTOMY  03/10/2017   HERNIA REPAIR     Ileostomy Takedown  08/2002   Dr. Deon Pilling   INCISION AND DRAINAGE FOOT     INCISIONAL HERNIA REPAIR N/A 01/20/2013   Procedure: HERNIA REPAIR INCISIONAL;  Surgeon: Zenovia Jarred, MD;  Location: [place];  Service: General;  Laterality: N/A;   [place]  04/13/2014   with mesh    dr Alene Mires HERNIA REPAIR N/A 04/13/2014    Procedure: OPEN REPAIR INCISIONAL HERNIA ;  Surgeon: Georganna Skeans, MD;  Location: [place];  Service: General;  Laterality: N/A;   INSERTION OF MESH N/A 04/13/2014   Procedure: INSERTION OF MESH;  Surgeon: Georganna Skeans, MD;  Location: [place];  Service: General;  Laterality: N/A;   KNEE ARTHROSCOPY Right 2010   LAPAROSCOPIC CHOLECYSTECTOMY  10/16/2014   LAPAROTOMY N/A 01/20/2013   Procedure: EXPLORATORY LAPAROTOMY;  Surgeon: Zenovia Jarred, MD;  Location: [place];  Service: General;  Laterality: N/A;   LAPAROTOMY N/A 03/10/2017   Procedure: EXPLORATORY LAPAROTOMY;  Surgeon: Georganna Skeans, MD;  Location: [place];  Service: General;  Laterality: N/A;   Laparotomy with lysis of adhesions  2010   Dr. Grandville Silos - SBO   LYSIS OF ADHESION N/A 01/20/2013   Procedure: LYSIS OF ADHESION;  Surgeon: Zenovia Jarred, MD;  Location: [place];  Service: General;  Laterality: N/A;   LYSIS OF ADHESION N/A 03/10/2017   Procedure: LYSIS OF ADHESION;  Surgeon: Georganna Skeans, MD;  Location: [place];  Service: General;  Laterality: N/A;   microsurgical  03/24/2002   Lateral recess decompression L5   SACRAL NERVE STIMULATOR PLACEMENT     inserted and removed  SHOULDER ARTHROSCOPY Bilateral    VASECTOMY     Patient Active Problem List   Diagnosis Date Noted   PAF (paroxysmal atrial fibrillation) ([place]) 02/26/2019   S/P exploratory laparotomy 03/10/2017   Elevated blood pressure reading 12/30/2016   Nonspecific abnormal electrocardiogram (ECG) (EKG) 09/01/2016   History of colectomy 02/13/2016   Intractable abdominal pain 02/13/2016   S/P laparoscopic cholecystectomy 10/16/2014   S/P hernia repair 04/13/2014   Pre-op evaluation 03/24/2014   H/O abdominal surgery 08/18/2013   GERD (gastroesophageal reflux disease) 08/18/2013   Chronic back pain 08/18/2013   SBO (small bowel obstruction) ([place]) 08/12/2013    REFERRING DIAG: Other spondylosis, lumbosacral region [M47.897]  THERAPY DIAG:  Chronic right-sided low  back pain with right-sided sciatica  Muscle weakness  PERTINENT HISTORY: Hx of AFIB, L4-L5 past lumbar surgery  PRECAUTIONS: None  ONSET DATE: >2 years  SUBJECTIVE:  Pt presents to PT with reports of continued lower back and R LE pain. Pt has been fairly compliant with HEP with no adverse effect. He is ready to begin PT at this time.   PAIN:  Are you having pain? Yes Pain location: R sided low back pain with radiation into R foot NPRS scale: 4/10 Aggravating factors: lifting more than a gallon of milk, bending, standing (30 min) Relieving factors: laying down Pain description: intermittent, constant, stabbing, and shooting Severity: high Irritability: low Stage: Chronic Stability: getting worse    OBJECTIVE:    GENERAL OBSERVATION:           Lateral shift: R lateral shift in walking (minor benefit for correction)   SENSATION:          Light touch: Deficits of subjective n/t throughout lateral R LE   MUSCLE LENGTH: Hamstrings: Right limited; Left WNL   LUMBAR AROM   AROM AROM  07/26/2021  Flexion limited by 50%, w/ concordant pain  Extension limited by 50%, w/ concordant pain  Right lateral flexion limited by 25%, w/ concordant pain  Left lateral flexion limited by 25%  Right rotation limited by 75%, w/ concordant pain  Left rotation limited by 75%, w/ concordant pain    (Blank rows = not tested)     LE MMT:   MMT Right 07/26/2021 Left 07/26/2021  Hip flexion (L2, L3)      Knee extension (L3)      Knee flexion      Hip abduction      Hip extension      Hip external rotation      Hip internal rotation      Hip adduction      Ankle dorsiflexion (L4)      Ankle plantarflexion (S1)      Ankle inversion      Ankle eversion      Great Toe ext (L5)      Grossly  3 (D)* 4    (Blank rows = not tested, score listed is out of 5 possible points.  N = WNL, D = diminished, C = clear for gross weakness with myotome testing, * = concordant pain with testing)     LE  ROM:   ROM Right 07/26/2021 Left 07/26/2021  Hip flexion      Hip extension      Hip abduction      Hip adduction      Hip internal rotation      Hip external rotation      Knee flexion      Knee extension  Ankle dorsiflexion      Ankle plantarflexion      Ankle inversion      Ankle eversion         (Blank rows = not tested, N = WNL, * = concordant pain with testing)    LUMBAR SPECIAL TESTS:  Straight leg raise: L (-), R (+) Slump: L (-), R (+)    PALPATION:            TTP throughout R lumbar spine    DIRECTIONAL PREFERENCE:           Possible R lateral shift correction   FUNCTIONAL TESTS:  30'' STS: 7 reps with UE   GAIT: Antalgic with reduced time in stance on R   PATIENT SURVEYS:  FOTO 34 -> 51       TODAY'S TREATMENT  [facility] Adult PT Treatment:                                                DATE: 08/05/2021 Therapeutic Exercise: Nustep level 6 UE/LE x 5 min while taking subjective Mini squats with UE support x 10 Standing hip abd/ext x 10 Ball roll outs blue PB forward/side x10 each Hooklying clamshell BTB 2x15 Supine hip adduction pilates ring 2x10 Bridge 2x10 Supine hamstring stretch with strap x 30 sec BIL Supine 90/90 hip flexion feet on pball 2x15 Supine sciatic nerve glide 2x15 R LE LTR x10 BIL  [facility] Adult PT Treatment:                                                DATE: 08/03/2021 Therapeutic Exercise: Nustep level 5 x 5 mins Mini squats with UE support x 10, verbal cues for proper form Ball roll outs blue PB forward/side x10 each Seated march 2# 2 x10 BIL LAQ 2# x 10 BIL Hooklying clamshell GTB 2 x10 Supine hip adduction ball squeeze 2 x 10 Bridge x 10 Supine hamstring stretch with strap x 30 sec BIL LTR x10 BIL Neuromuscular re-ed: Tandem stance eyes closed x 30 sec ea SLS x 30 sec L, x 25 sec R before LOB SLS cone taps with single UE support 2 x 30 sec BIL   [facility] Adult PT Treatment:                                                 DATE: 08/02/2021 Therapeutic Exercise: Nustep level 5 x 5 mins Step ups forward/lateral 4" step x 10 each Mini squats with UE support x 10 LAQ x 10 BIL Hooklying clamshell GTB 2 x10 Supine hip adduction ball squeeze 2 x 10 Bridge 2 x 10 LTR x10 BIL Ball roll outs blue PB forward/side x10 each Seated march 2# x10 BIL STS x 10 Neuromuscular re-ed: Romberg stance with head turns x 30 sec Romberg stance eyes closed 2 x 30 sec Tandem stance eyes open x 30 sec ea SLS x 20 sec L, x 10 sec R before LOB   PATIENT EDUCATION:  HEP updated     HOME EXERCISE PROGRAM: Access Code: [email or web address] URL: [email or web address] Date: 07/31/2021 Prepared  by: Evelene Croon  Exercises Hooklying Clamshell with Resistance - 2 x daily - 7 x weekly - 3 sets - 10 reps Supine Hip Adduction Isometric with Ball - 2 x daily - 7 x weekly - 2 sets - 10 reps - 10'' hold Seated Sciatic Tensioner - 2 x daily - 7 x weekly - 20 reps Supine Posterior Pelvic Tilt - 2 x daily - 7 x weekly - 2 sets - 10 reps - 5'' hold Added 07/31/2021 Supine Bridge - 1 x daily - 7 x weekly - 3 sets - 10 reps Added 08/05/2021 Supine Sciatic Nerve Glide - 1 x daily - 7 x weekly - 2 sets - 15 reps      ASSESSMENT: Pt was able to complete all prescribed exercises with no adverse effect or increase in pain. Therapy today focused on improving core and proximal hip strength while trying to decrease pain down R LE. HEP updated for supine sciatic nerve glides working in on additional decrease in radicular symptoms. Pt continues to benefit from skilled therapy services and will continue to be seen and progressed as able.     GOALS:   SHORT TERM GOALS:   STG Name Target Date Goal status  1 Murrel will be >75% HEP compliant to improve carryover between sessions and facilitate independent management of condition   Baseline: No HEP 08/16/2021 MET    LONG TERM GOALS:    LTG Name Target Date Goal status  1 Corbyn will improve FOTO  score from 34 (baseline) to 51 as a proxy for functional improvement 09/20/2021 INITIAL  2 Markail will improve 30'' STS (MCID 2) to >/= 4x (w/ UE?: y) to show improved LE strength and improved transfers    Baseline: 7x  w/ UE? Y 09/20/2021 INITIAL  3 Ulises will report >/= 50% decrease in pain from evaluation    Baseline: 8/10 max pain 09/20/2021 INITIAL  4 Tanuj will be able to stand for 50 min while making a meal, not limited by pain    Baseline: 30 min 09/20/2021 INITIAL    PLAN: PT FREQUENCY: 1-3x/week   PT DURATION: 8 weeks (Ending 09/20/2021)   PLANNED INTERVENTIONS: Therapeutic exercises, Therapeutic activity, Neuro Muscular re-education, Gait training, Patient/Family education, Joint mobilization, Dry Needling, Electrical stimulation, Spinal mobilization and/or manipulation, Moist heat, Taping, Vasopneumatic device, Ionotophoresis 23m/ml Dexamethasone, and Manual therapy   PLAN FOR NEXT SESSION: Walking program information, see if correction of R lateral shift has an effect, core strengthening, dural stretching, possibly using the cane, assess HEP     DWard Chatters PT 08/05/2021, 1:54 PM

## 2021-08-08 ENCOUNTER — Encounter: Payer: Self-pay | Admitting: Physical Therapy

## 2021-08-08 ENCOUNTER — Ambulatory Visit: Payer: Medicare Other | Admitting: Physical Therapy

## 2021-08-08 ENCOUNTER — Other Ambulatory Visit: Payer: Self-pay

## 2021-08-08 DIAGNOSIS — R2689 Other abnormalities of gait and mobility: Secondary | ICD-10-CM

## 2021-08-08 DIAGNOSIS — M5441 Lumbago with sciatica, right side: Secondary | ICD-10-CM | POA: Diagnosis not present

## 2021-08-08 DIAGNOSIS — G8929 Other chronic pain: Secondary | ICD-10-CM

## 2021-08-08 DIAGNOSIS — M6281 Muscle weakness (generalized): Secondary | ICD-10-CM

## 2021-08-08 NOTE — Therapy (Signed)
OUTPATIENT PHYSICAL THERAPY TREATMENT NOTE   Patient Name: Kent [name] MRN: [id number] DOB:01/25/1941, 81 y.o., male Today's Date: 08/08/2021  PCP: Shirline Frees, MD REFERRING PROVIDER: Starling Manns, MD   PT End of Session - 08/08/21 1351     Visit Number 6    Number of Visits 16    Date for PT Re-Evaluation 09/20/21    Authorization Type MCR    PT Start Time 1350    PT Stop Time 1430    PT Time Calculation (min) 40 min    Activity Tolerance Patient tolerated treatment well    Behavior During Therapy [facility] for tasks assessed/performed                 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       Cancer ([place])    basal cell cancer - skin   Chronic back pain    s/p back surgery   GERD (gastroesophageal reflux disease) 08/18/2013   Incisional hernia, without obstruction or gangrene 02/02/2013   Postural dizziness with presyncope 09/01/2016   Sinus infection    currently on antibiotics   Small bowel obstruction due to adhesions [facility])    Past Surgical History:  Procedure Laterality Date   BACK SURGERY  08/25/2001   Neck 3 disk replacements   BACK SURGERY     Placement/removal of nerve stimulator   CHOLECYSTECTOMY N/A 10/16/2014   Procedure: LAPAROSCOPIC CHOLECYSTECTOMY WITH ATTEMPTED CHOLANGIOGRAM;  Surgeon: Georganna Skeans, MD;  Location: [place];  Service: General;  Laterality: N/A;   COLON SURGERY  04/2002   Dr. Deon Pilling - Subtotal colectomy, ileostomy   ELBOW SURGERY Right    for blood vessel & " hung" nerve   ESOPHAGEAL DILATION     EXPLORATORY LAPAROTOMY  03/10/2017   HERNIA REPAIR     Ileostomy Takedown  08/2002   Dr. Deon Pilling   INCISION AND DRAINAGE FOOT     INCISIONAL HERNIA REPAIR N/A 01/20/2013   Procedure: HERNIA REPAIR INCISIONAL;  Surgeon: Zenovia Jarred, MD;  Location: [place];  Service: General;  Laterality: N/A;   [place]  04/13/2014   with mesh    dr Alene Mires HERNIA REPAIR N/A 04/13/2014    Procedure: OPEN REPAIR INCISIONAL HERNIA ;  Surgeon: Georganna Skeans, MD;  Location: [place];  Service: General;  Laterality: N/A;   INSERTION OF MESH N/A 04/13/2014   Procedure: INSERTION OF MESH;  Surgeon: Georganna Skeans, MD;  Location: [place];  Service: General;  Laterality: N/A;   KNEE ARTHROSCOPY Right 2010   LAPAROSCOPIC CHOLECYSTECTOMY  10/16/2014   LAPAROTOMY N/A 01/20/2013   Procedure: EXPLORATORY LAPAROTOMY;  Surgeon: Zenovia Jarred, MD;  Location: [place];  Service: General;  Laterality: N/A;   LAPAROTOMY N/A 03/10/2017   Procedure: EXPLORATORY LAPAROTOMY;  Surgeon: Georganna Skeans, MD;  Location: [place];  Service: General;  Laterality: N/A;   Laparotomy with lysis of adhesions  2010   Dr. Grandville Silos - SBO   LYSIS OF ADHESION N/A 01/20/2013   Procedure: LYSIS OF ADHESION;  Surgeon: Zenovia Jarred, MD;  Location: [place];  Service: General;  Laterality: N/A;   LYSIS OF ADHESION N/A 03/10/2017   Procedure: LYSIS OF ADHESION;  Surgeon: Georganna Skeans, MD;  Location: [place];  Service: General;  Laterality: N/A;   microsurgical  03/24/2002   Lateral recess decompression L5   SACRAL NERVE STIMULATOR PLACEMENT     inserted and  removed   SHOULDER ARTHROSCOPY Bilateral    VASECTOMY     Patient Active Problem List   Diagnosis Date Noted   PAF (paroxysmal atrial fibrillation) ([place]) 02/26/2019   S/P exploratory laparotomy 03/10/2017   Elevated blood pressure reading 12/30/2016   Nonspecific abnormal electrocardiogram (ECG) (EKG) 09/01/2016   History of colectomy 02/13/2016   Intractable abdominal pain 02/13/2016   S/P laparoscopic cholecystectomy 10/16/2014   S/P hernia repair 04/13/2014   Pre-op evaluation 03/24/2014   H/O abdominal surgery 08/18/2013   GERD (gastroesophageal reflux disease) 08/18/2013   Chronic back pain 08/18/2013   SBO (small bowel obstruction) ([place]) 08/12/2013    REFERRING DIAG: Other spondylosis, lumbosacral region [M47.897]  THERAPY DIAG:  Chronic right-sided low  back pain with right-sided sciatica  Muscle weakness  Other abnormalities of gait and mobility  PERTINENT HISTORY: Hx of AFIB, L4-L5 past lumbar surgery  PRECAUTIONS: None  ONSET DATE: >2 years  SUBJECTIVE:  Pt reports that he has been HEP compliant.  He feels that PT has been helping him get stronger.  He is still having R sided low back and R LE pain  PAIN:  Are you having pain? Yes Pain location: R sided low back pain with radiation into R foot NPRS scale: 5/10 Aggravating factors: lifting more than a gallon of milk, bending, standing (30 min) Relieving factors: laying down Pain description: intermittent, constant, stabbing, and shooting Severity: high Irritability: low Stage: Chronic Stability: getting worse    OBJECTIVE:    GENERAL OBSERVATION:           Lateral shift: R lateral shift in walking (minor benefit for correction)   SENSATION:          Light touch: Deficits of subjective n/t throughout lateral R LE   MUSCLE LENGTH: Hamstrings: Right limited; Left WNL   LUMBAR AROM   AROM AROM  07/26/2021  Flexion limited by 50%, w/ concordant pain  Extension limited by 50%, w/ concordant pain  Right lateral flexion limited by 25%, w/ concordant pain  Left lateral flexion limited by 25%  Right rotation limited by 75%, w/ concordant pain  Left rotation limited by 75%, w/ concordant pain    (Blank rows = not tested)     LE MMT:   MMT Right 07/26/2021 Left 07/26/2021  Hip flexion (L2, L3)      Knee extension (L3)      Knee flexion      Hip abduction      Hip extension      Hip external rotation      Hip internal rotation      Hip adduction      Ankle dorsiflexion (L4)      Ankle plantarflexion (S1)      Ankle inversion      Ankle eversion      Great Toe ext (L5)      Grossly  3 (D)* 4    (Blank rows = not tested, score listed is out of 5 possible points.  N = WNL, D = diminished, C = clear for gross weakness with myotome testing, * = concordant pain  with testing)     LE ROM:   ROM Right 07/26/2021 Left 07/26/2021  Hip flexion      Hip extension      Hip abduction      Hip adduction      Hip internal rotation      Hip external rotation      Knee flexion  Knee extension      Ankle dorsiflexion      Ankle plantarflexion      Ankle inversion      Ankle eversion         (Blank rows = not tested, N = WNL, * = concordant pain with testing)    LUMBAR SPECIAL TESTS:  Straight leg raise: L (-), R (+) Slump: L (-), R (+)    PALPATION:            TTP throughout R lumbar spine    DIRECTIONAL PREFERENCE:           Possible R lateral shift correction   FUNCTIONAL TESTS:  30'' STS: 7 reps with UE   GAIT: Antalgic with reduced time in stance on R   PATIENT SURVEYS:  FOTO 34 -> 51   ASTERISK SIGNS   Asterisk Signs        30'' STS        balance        R LE strength                               TODAY'S TREATMENT   Treatment: DATE: 08/08/2021  Therapeutic Exercise: Bike L2 UE/LE x 5 min while taking subjective Mini squats with UE support x 10 Standing hip abd/ext x 10 Ball roll outs blue PB forward/side x10 each LTR - 20x Hooklying alternating clamshell Blue TB 2x10 Supine hip adduction pilates ring 2x10 3'' hold Bridge 2x10 5'' hold Supine hamstring stretch with strap x 30 sec BIL + nerve glide Alternating SLR from foam roller - 3x10  Therapeutic Activity - collecting information for FOTO, checking progress, and reviewing with patient  [facility] Adult PT Treatment:                                                DATE: 08/05/2021 Therapeutic Exercise: Nustep level 6 UE/LE x 5 min while taking subjective Mini squats with UE support x 10 Standing hip abd/ext x 10 Ball roll outs blue PB forward/side x10 each Hooklying clamshell BTB 2x15 Supine hip adduction pilates ring 2x10 Bridge 2x10 Supine hamstring stretch with strap x 30 sec BIL Supine 90/90 hip flexion feet on pball 2x15 Supine sciatic nerve glide  2x15 R LE LTR x10 BIL  [facility] Adult PT Treatment:                                                DATE: 08/03/2021 Therapeutic Exercise: Nustep level 5 x 5 mins Mini squats with UE support x 10, verbal cues for proper form Ball roll outs blue PB forward/side x10 each Seated march 2# 2 x10 BIL LAQ 2# x 10 BIL Hooklying clamshell GTB 2 x10 Supine hip adduction ball squeeze 2 x 10 Bridge x 10 Supine hamstring stretch with strap x 30 sec BIL LTR x10 BIL Neuromuscular re-ed: Tandem stance eyes closed x 30 sec ea SLS x 30 sec L, x 25 sec R before LOB SLS cone taps with single UE support 2 x 30 sec BIL   [facility] Adult PT Treatment:  DATE: 08/02/2021 Therapeutic Exercise: Nustep level 5 x 5 mins Step ups forward/lateral 4" step x 10 each Mini squats with UE support x 10 LAQ x 10 BIL Hooklying clamshell GTB 2 x10 Supine hip adduction ball squeeze 2 x 10 Bridge 2 x 10 LTR x10 BIL Ball roll outs blue PB forward/side x10 each Seated march 2# x10 BIL STS x 10 Neuromuscular re-ed: Romberg stance with head turns x 30 sec Romberg stance eyes closed 2 x 30 sec Tandem stance eyes open x 30 sec ea SLS x 20 sec L, x 10 sec R before LOB   PATIENT EDUCATION:  HEP updated     HOME EXERCISE PROGRAM: Access Code: [email or web address] URL: [email or web address] Date: 07/31/2021 Prepared by: [place] with Resistance - 2 x daily - 7 x weekly - 3 sets - 10 reps Supine Hip Adduction Isometric with Ball - 2 x daily - 7 x weekly - 2 sets - 10 reps - 10'' hold Seated Sciatic Tensioner - 2 x daily - 7 x weekly - 20 reps Supine Posterior Pelvic Tilt - 2 x daily - 7 x weekly - 2 sets - 10 reps - 5'' hold Added 07/31/2021 Supine Bridge - 1 x daily - 7 x weekly - 3 sets - 10 reps Added 08/05/2021 Supine Sciatic Nerve Glide - 1 x daily - 7 x weekly - 2 sets - 15 reps      ASSESSMENT: Versie is progressing well with  therapy.  Pt reports no increase in baseline pain following therapy.  Today we concentrated on core strengthening and hip strengthening.  Pt able to progress bridge with longer holds.  He is improving his FOTO score showed significant improvement as well..  Pt will continue to benefit from skilled physical therapy to address remaining deficits and achieve listed goals.  Continue per POC.   GOALS:   SHORT TERM GOALS:   STG Name Target Date Goal status  1 Lynk will be >75% HEP compliant to improve carryover between sessions and facilitate independent management of condition   Baseline: No HEP 08/16/2021 MET    LONG TERM GOALS:    LTG Name Target Date Goal status  1 Haaris will improve FOTO score from 34 (baseline) to 51 as a proxy for functional improvement  2/16: 47 09/20/2021 Ongoing  2 Kaysan will improve 30'' STS (MCID 2) to >/= 4x (w/ UE?: y) to show improved LE strength and improved transfers    Baseline: 7x  w/ UE? Y 09/20/2021 INITIAL  3 Hasan will report >/= 50% decrease in pain from evaluation    Baseline: 8/10 max pain 09/20/2021 INITIAL  4 Suren will be able to stand for 50 min while making a meal, not limited by pain    Baseline: 30 min 09/20/2021 INITIAL    PLAN: PT FREQUENCY: 1-3x/week   PT DURATION: 8 weeks (Ending 09/20/2021)   PLANNED INTERVENTIONS: Therapeutic exercises, Therapeutic activity, Neuro Muscular re-education, Gait training, Patient/Family education, Joint mobilization, Dry Needling, Electrical stimulation, Spinal mobilization and/or manipulation, Moist heat, Taping, Vasopneumatic device, Ionotophoresis 1m/ml Dexamethasone, and Manual therapy   PLAN FOR NEXT SESSION: Walking program information, see if correction of R lateral shift has an effect, core strengthening, dural stretching, possibly using the cane, assess HEP     KMathis Dad PT 08/08/2021, 2:27 PM

## 2021-08-10 ENCOUNTER — Ambulatory Visit: Payer: Medicare Other | Admitting: Physical Therapy

## 2021-08-10 ENCOUNTER — Other Ambulatory Visit: Payer: Self-pay

## 2021-08-10 ENCOUNTER — Encounter: Payer: Self-pay | Admitting: Physical Therapy

## 2021-08-10 DIAGNOSIS — R2689 Other abnormalities of gait and mobility: Secondary | ICD-10-CM

## 2021-08-10 DIAGNOSIS — M6281 Muscle weakness (generalized): Secondary | ICD-10-CM

## 2021-08-10 DIAGNOSIS — M5441 Lumbago with sciatica, right side: Secondary | ICD-10-CM | POA: Diagnosis not present

## 2021-08-10 DIAGNOSIS — G8929 Other chronic pain: Secondary | ICD-10-CM

## 2021-08-10 NOTE — Therapy (Signed)
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       Cancer ([place])    basal cell cancer - skin   Chronic back pain    s/p back surgery   GERD (gastroesophageal reflux disease) 08/18/2013   Incisional hernia, without obstruction or gangrene 02/02/2013   Postural dizziness with presyncope 09/01/2016   Sinus infection    currently on antibiotics   Small bowel obstruction due to adhesions [facility])    Past Surgical History:  Procedure Laterality Date   BACK SURGERY  08/25/2001   Neck 3 disk replacements   BACK SURGERY     Placement/removal of nerve stimulator   CHOLECYSTECTOMY N/A 10/16/2014   Procedure: LAPAROSCOPIC CHOLECYSTECTOMY WITH ATTEMPTED CHOLANGIOGRAM;  Surgeon: Georganna Skeans, MD;  Location: [place];  Service: General;  Laterality: N/A;   COLON SURGERY  04/2002   Dr. Deon Pilling - Subtotal colectomy, ileostomy   ELBOW SURGERY Right    for blood vessel & " hung" nerve   ESOPHAGEAL DILATION     EXPLORATORY LAPAROTOMY  03/10/2017   HERNIA REPAIR     Ileostomy Takedown  08/2002   Dr. Deon Pilling   INCISION AND DRAINAGE FOOT     INCISIONAL HERNIA REPAIR N/A 01/20/2013   Procedure: HERNIA REPAIR INCISIONAL;  Surgeon: Zenovia Jarred, MD;  Location: [place];  Service: General;  Laterality: N/A;   [place]  04/13/2014   with mesh    dr Alene Mires HERNIA REPAIR N/A 04/13/2014    Procedure: OPEN REPAIR INCISIONAL HERNIA ;  Surgeon: Georganna Skeans, MD;  Location: [place];  Service: General;  Laterality: N/A;   INSERTION OF MESH N/A 04/13/2014   Procedure: INSERTION OF MESH;  Surgeon: Georganna Skeans, MD;  Location: [place];  Service: General;  Laterality: N/A;   KNEE ARTHROSCOPY Right 2010   LAPAROSCOPIC CHOLECYSTECTOMY  10/16/2014   LAPAROTOMY N/A 01/20/2013   Procedure: EXPLORATORY LAPAROTOMY;  Surgeon: Zenovia Jarred, MD;  Location: [place];  Service: General;  Laterality: N/A;   LAPAROTOMY N/A 03/10/2017   Procedure: EXPLORATORY LAPAROTOMY;  Surgeon: Georganna Skeans, MD;  Location: [place];  Service: General;  Laterality: N/A;   Laparotomy with lysis of adhesions  2010   Dr. Grandville Silos - SBO   LYSIS OF ADHESION N/A 01/20/2013   Procedure: LYSIS OF ADHESION;  Surgeon: Zenovia Jarred, MD;  Location: [place];  Service: General;  Laterality: N/A;   LYSIS OF ADHESION N/A 03/10/2017   Procedure: LYSIS OF ADHESION;  Surgeon: Georganna Skeans, MD;  Location: [place];  Service: General;  Laterality: N/A;   microsurgical  03/24/2002   Lateral recess decompression L5   SACRAL NERVE STIMULATOR PLACEMENT     inserted and  removed   SHOULDER ARTHROSCOPY Bilateral    VASECTOMY     Patient Active Problem List   Diagnosis Date Noted   PAF (paroxysmal atrial fibrillation) ([place]) 02/26/2019   S/P exploratory laparotomy 03/10/2017   Elevated blood pressure reading 12/30/2016   Nonspecific abnormal electrocardiogram (ECG) (EKG) 09/01/2016   History of colectomy 02/13/2016   Intractable abdominal pain 02/13/2016   S/P laparoscopic cholecystectomy 10/16/2014   S/P hernia repair 04/13/2014   Pre-op evaluation 03/24/2014   H/O abdominal surgery 08/18/2013   GERD (gastroesophageal reflux disease) 08/18/2013   Chronic back pain 08/18/2013   SBO (small bowel obstruction) ([place]) 08/12/2013    REFERRING DIAG: Other spondylosis, lumbosacral region [M47.897]  THERAPY DIAG:  Chronic right-sided low  back pain with right-sided sciatica  Muscle weakness  Other abnormalities of gait and mobility  PERTINENT HISTORY: Hx of AFIB, L4-L5 past lumbar surgery  PRECAUTIONS: None  ONSET DATE: >2 years  SUBJECTIVE:  Pt reports that he did his bridges last night and this caused some soreness in his back.  PAIN:  Are you having pain? Yes Pain location: R sided low back pain with radiation into R foot NPRS scale: 5/10 Aggravating factors: lifting more than a gallon of milk, bending, standing (30 min) Relieving factors: laying down Pain description: intermittent, constant, stabbing, and shooting Severity: high Irritability: low Stage: Chronic Stability: getting worse    OBJECTIVE:    GENERAL OBSERVATION:           Lateral shift: R lateral shift in walking (minor benefit for correction)   SENSATION:          Light touch: Deficits of subjective n/t throughout lateral R LE   MUSCLE LENGTH: Hamstrings: Right limited; Left WNL   LUMBAR AROM   AROM AROM  07/26/2021  Flexion limited by 50%, w/ concordant pain  Extension limited by 50%, w/ concordant pain  Right lateral flexion limited by 25%, w/ concordant pain  Left lateral flexion limited by 25%  Right rotation limited by 75%, w/ concordant pain  Left rotation limited by 75%, w/ concordant pain    (Blank rows = not tested)     LE MMT:   MMT Right 07/26/2021 Left 07/26/2021  Hip flexion (L2, L3)      Knee extension (L3)      Knee flexion      Hip abduction      Hip extension      Hip external rotation      Hip internal rotation      Hip adduction      Ankle dorsiflexion (L4)      Ankle plantarflexion (S1)      Ankle inversion      Ankle eversion      Great Toe ext (L5)      Grossly  3 (D)* 4    (Blank rows = not tested, score listed is out of 5 possible points.  N = WNL, D = diminished, C = clear for gross weakness with myotome testing, * = concordant pain with testing)     LE ROM:   ROM Right 07/26/2021  Left 07/26/2021  Hip flexion      Hip extension      Hip abduction      Hip adduction      Hip internal rotation      Hip external rotation      Knee flexion      Knee extension      Ankle dorsiflexion  Ankle plantarflexion      Ankle inversion      Ankle eversion         (Blank rows = not tested, N = WNL, * = concordant pain with testing)    LUMBAR SPECIAL TESTS:  Straight leg raise: L (-), R (+) Slump: L (-), R (+)    PALPATION:            TTP throughout R lumbar spine    DIRECTIONAL PREFERENCE:           Possible R lateral shift correction   FUNCTIONAL TESTS:  30'' STS: 7 reps with UE   GAIT: Antalgic with reduced time in stance on R   PATIENT SURVEYS:  FOTO 34 -> 51   ASTERISK SIGNS   Asterisk Signs 2/18       30'' STS        balance 20'' tandem on foam       R LE strength                               TODAY'S TREATMENT   Treatment: DATE: 08/10/2021  Therapeutic Exercise: Bike L2 x 5 min while taking subjective STS - 3x10 from blue cair + airex + 5# KB Standing hip abd - 17.5# 2x10 Standing hip ext cybex - 17.5# - x10 Cybex leg press - 3x10 @ 60#  NRE: Tandem on foam 3x45'' R in rear  Treatment: DATE: 08/08/2021  Therapeutic Exercise: Bike L2 UE/LE x 5 min while taking subjective Mini squats with UE support x 10 Standing hip abd/ext x 10 Ball roll outs blue PB forward/side x10 each LTR - 20x Hooklying alternating clamshell Blue TB 2x10 Supine hip adduction pilates ring 2x10 3'' hold Bridge 2x10 5'' hold Supine hamstring stretch with strap x 30 sec BIL + nerve glide Alternating SLR from foam roller - 3x10  Therapeutic Activity - collecting information for FOTO, checking progress, and reviewing with patient  [facility] Adult PT Treatment:                                                DATE: 08/05/2021 Therapeutic Exercise: Nustep level 6 UE/LE x 5 min while taking subjective Mini squats with UE support x 10 Standing hip abd/ext x  10 Ball roll outs blue PB forward/side x10 each Hooklying clamshell BTB 2x15 Supine hip adduction pilates ring 2x10 Bridge 2x10 Supine hamstring stretch with strap x 30 sec BIL Supine 90/90 hip flexion feet on pball 2x15 Supine sciatic nerve glide 2x15 R LE LTR x10 BIL  [facility] Adult PT Treatment:                                                DATE: 08/03/2021 Therapeutic Exercise: Nustep level 5 x 5 mins Mini squats with UE support x 10, verbal cues for proper form Ball roll outs blue PB forward/side x10 each Seated march 2# 2 x10 BIL LAQ 2# x 10 BIL Hooklying clamshell GTB 2 x10 Supine hip adduction ball squeeze 2 x 10 Bridge x 10 Supine hamstring stretch with strap x 30 sec BIL LTR x10 BIL Neuromuscular re-ed: Tandem stance eyes closed x 30  sec ea SLS x 30 sec L, x 25 sec R before LOB SLS cone taps with single UE support 2 x 30 sec BIL   [facility] Adult PT Treatment:                                                DATE: 08/02/2021 Therapeutic Exercise: Nustep level 5 x 5 mins Step ups forward/lateral 4" step x 10 each Mini squats with UE support x 10 LAQ x 10 BIL Hooklying clamshell GTB 2 x10 Supine hip adduction ball squeeze 2 x 10 Bridge 2 x 10 LTR x10 BIL Ball roll outs blue PB forward/side x10 each Seated march 2# x10 BIL STS x 10 Neuromuscular re-ed: Romberg stance with head turns x 30 sec Romberg stance eyes closed 2 x 30 sec Tandem stance eyes open x 30 sec ea SLS x 20 sec L, x 10 sec R before LOB   PATIENT EDUCATION:  HEP updated     HOME EXERCISE PROGRAM: Access Code: [email or web address] URL: [email or web address] Date: 07/31/2021 Prepared by: [place] with Resistance - 2 x daily - 7 x weekly - 3 sets - 10 reps Supine Hip Adduction Isometric with Ball - 2 x daily - 7 x weekly - 2 sets - 10 reps - 10'' hold Seated Sciatic Tensioner - 2 x daily - 7 x weekly - 20 reps Supine Posterior Pelvic Tilt - 2 x daily - 7 x  weekly - 2 sets - 10 reps - 5'' hold Added 07/31/2021 Supine Bridge - 1 x daily - 7 x weekly - 3 sets - 10 reps Added 08/05/2021 Supine Sciatic Nerve Glide - 1 x daily - 7 x weekly - 2 sets - 15 reps      ASSESSMENT: Kent [name] is progressing well with therapy.  Pt reports no increase in baseline pain following therapy.  Today we concentrated on hip strengthening and balance/proprioception.  Pt with high level of fatigue with hip abduction machine, but able to complete with good form.  Deferred bridge today d/t pain/soreness after HEP.  Pt will continue to benefit from skilled physical therapy to address remaining deficits and achieve listed goals.  Continue per POC.   GOALS:   SHORT TERM GOALS:   STG Name Target Date Goal status  1 Kent [name] will be >75% HEP compliant to improve carryover between sessions and facilitate independent management of condition   Baseline: No HEP 08/16/2021 MET    LONG TERM GOALS:    LTG Name Target Date Goal status  1 Kent [name] will improve FOTO score from 34 (baseline) to 51 as a proxy for functional improvement  2/16: 47 09/20/2021 Ongoing  2 Kent [name] will improve 30'' STS (MCID 2) to >/= 4x (w/ UE?: y) to show improved LE strength and improved transfers    Baseline: 7x  w/ UE? Y 09/20/2021 INITIAL  3 Kent [name] will report >/= 50% decrease in pain from evaluation    Baseline: 8/10 max pain 09/20/2021 INITIAL  4 Kent [name] will be able to stand for 50 min while making a meal, not limited by pain    Baseline: 30 min 09/20/2021 INITIAL    PLAN: PT FREQUENCY: 1-3x/week   PT DURATION: 8 weeks (Ending 09/20/2021)   PLANNED INTERVENTIONS: Therapeutic exercises, Therapeutic activity, Neuro Muscular re-education, Gait training, Patient/Family education, Joint mobilization, Dry Needling, Electrical stimulation, Spinal  mobilization and/or manipulation, Moist heat, Taping, Vasopneumatic device, Ionotophoresis 31m/ml Dexamethasone, and Manual therapy   PLAN FOR NEXT SESSION: Walking  program information, see if correction of R lateral shift has an effect, core strengthening, dural stretching, possibly using the cane, assess HEP     KMathis Dad PT 08/10/2021, 12:00 PM

## 2021-08-13 ENCOUNTER — Encounter: Payer: Self-pay | Admitting: Physical Therapy

## 2021-08-13 ENCOUNTER — Other Ambulatory Visit: Payer: Self-pay

## 2021-08-13 ENCOUNTER — Ambulatory Visit: Payer: Medicare Other | Admitting: Physical Therapy

## 2021-08-13 DIAGNOSIS — M5441 Lumbago with sciatica, right side: Secondary | ICD-10-CM | POA: Diagnosis not present

## 2021-08-13 DIAGNOSIS — M6281 Muscle weakness (generalized): Secondary | ICD-10-CM

## 2021-08-13 DIAGNOSIS — R2689 Other abnormalities of gait and mobility: Secondary | ICD-10-CM

## 2021-08-13 DIAGNOSIS — G8929 Other chronic pain: Secondary | ICD-10-CM

## 2021-08-13 NOTE — Therapy (Signed)
OUTPATIENT PHYSICAL THERAPY TREATMENT NOTE   Patient Name: Kent [name] MRN: [id number] DOB:1941-06-05, 81 y.o., male Today's Date: 08/13/2021  PCP: Shirline Frees, MD REFERRING PROVIDER: Starling Manns, MD   PT End of Session - 08/13/21 1401     Visit Number 8    Number of Visits 16    Date for PT Re-Evaluation 09/20/21    Authorization Type MCR    PT Start Time 1400    PT Stop Time 1442    PT Time Calculation (min) 42 min    Activity Tolerance Patient tolerated treatment well    Behavior During Therapy [facility] for tasks assessed/performed                 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       Cancer ([place])    basal cell cancer - skin   Chronic back pain    s/p back surgery   GERD (gastroesophageal reflux disease) 08/18/2013   Incisional hernia, without obstruction or gangrene 02/02/2013   Postural dizziness with presyncope 09/01/2016   Sinus infection    currently on antibiotics   Small bowel obstruction due to adhesions [facility])    Past Surgical History:  Procedure Laterality Date   BACK SURGERY  08/25/2001   Neck 3 disk replacements   BACK SURGERY     Placement/removal of nerve stimulator   CHOLECYSTECTOMY N/A 10/16/2014   Procedure: LAPAROSCOPIC CHOLECYSTECTOMY WITH ATTEMPTED CHOLANGIOGRAM;  Surgeon: Georganna Skeans, MD;  Location: [place];  Service: General;  Laterality: N/A;   COLON SURGERY  04/2002   Dr. Deon Pilling - Subtotal colectomy, ileostomy   ELBOW SURGERY Right    for blood vessel & " hung" nerve   ESOPHAGEAL DILATION     EXPLORATORY LAPAROTOMY  03/10/2017   HERNIA REPAIR     Ileostomy Takedown  08/2002   Dr. Deon Pilling   INCISION AND DRAINAGE FOOT     INCISIONAL HERNIA REPAIR N/A 01/20/2013   Procedure: HERNIA REPAIR INCISIONAL;  Surgeon: Zenovia Jarred, MD;  Location: [place];  Service: General;  Laterality: N/A;   [place]  04/13/2014   with mesh    dr Alene Mires HERNIA REPAIR N/A 04/13/2014    Procedure: OPEN REPAIR INCISIONAL HERNIA ;  Surgeon: Georganna Skeans, MD;  Location: [place];  Service: General;  Laterality: N/A;   INSERTION OF MESH N/A 04/13/2014   Procedure: INSERTION OF MESH;  Surgeon: Georganna Skeans, MD;  Location: [place];  Service: General;  Laterality: N/A;   KNEE ARTHROSCOPY Right 2010   LAPAROSCOPIC CHOLECYSTECTOMY  10/16/2014   LAPAROTOMY N/A 01/20/2013   Procedure: EXPLORATORY LAPAROTOMY;  Surgeon: Zenovia Jarred, MD;  Location: [place];  Service: General;  Laterality: N/A;   LAPAROTOMY N/A 03/10/2017   Procedure: EXPLORATORY LAPAROTOMY;  Surgeon: Georganna Skeans, MD;  Location: [place];  Service: General;  Laterality: N/A;   Laparotomy with lysis of adhesions  2010   Dr. Grandville Silos - SBO   LYSIS OF ADHESION N/A 01/20/2013   Procedure: LYSIS OF ADHESION;  Surgeon: Zenovia Jarred, MD;  Location: [place];  Service: General;  Laterality: N/A;   LYSIS OF ADHESION N/A 03/10/2017   Procedure: LYSIS OF ADHESION;  Surgeon: Georganna Skeans, MD;  Location: [place];  Service: General;  Laterality: N/A;   microsurgical  03/24/2002   Lateral recess decompression L5   SACRAL NERVE STIMULATOR PLACEMENT     inserted and  removed   SHOULDER ARTHROSCOPY Bilateral    VASECTOMY     Patient Active Problem List   Diagnosis Date Noted   PAF (paroxysmal atrial fibrillation) ([place]) 02/26/2019   S/P exploratory laparotomy 03/10/2017   Elevated blood pressure reading 12/30/2016   Nonspecific abnormal electrocardiogram (ECG) (EKG) 09/01/2016   History of colectomy 02/13/2016   Intractable abdominal pain 02/13/2016   S/P laparoscopic cholecystectomy 10/16/2014   S/P hernia repair 04/13/2014   Pre-op evaluation 03/24/2014   H/O abdominal surgery 08/18/2013   GERD (gastroesophageal reflux disease) 08/18/2013   Chronic back pain 08/18/2013   SBO (small bowel obstruction) ([place]) 08/12/2013    REFERRING DIAG: Other spondylosis, lumbosacral region [M47.897]  THERAPY DIAG:  Chronic right-sided low  back pain with right-sided sciatica  Muscle weakness  Other abnormalities of gait and mobility  PERTINENT HISTORY: Hx of AFIB, L4-L5 past lumbar surgery  PRECAUTIONS: None  ONSET DATE: >2 years  SUBJECTIVE:  Pt reports that last session was good.  He did have some significant pain on Sunday for no clear reason.  PAIN:  Are you having pain? Yes Pain location: R sided low back pain with radiation into R foot NPRS scale: 5/10 Aggravating factors: lifting more than a gallon of milk, bending, standing (30 min) Relieving factors: laying down Pain description: intermittent, constant, stabbing, and shooting Severity: high Irritability: low Stage: Chronic Stability: getting worse    OBJECTIVE:    GENERAL OBSERVATION:           Lateral shift: R lateral shift in walking (minor benefit for correction)   SENSATION:          Light touch: Deficits of subjective n/t throughout lateral R LE   MUSCLE LENGTH: Hamstrings: Right limited; Left WNL   LUMBAR AROM   AROM AROM  07/26/2021  Flexion limited by 50%, w/ concordant pain  Extension limited by 50%, w/ concordant pain  Right lateral flexion limited by 25%, w/ concordant pain  Left lateral flexion limited by 25%  Right rotation limited by 75%, w/ concordant pain  Left rotation limited by 75%, w/ concordant pain    (Blank rows = not tested)     LE MMT:   MMT Right 07/26/2021 Left 07/26/2021  Hip flexion (L2, L3)      Knee extension (L3)      Knee flexion      Hip abduction      Hip extension      Hip external rotation      Hip internal rotation      Hip adduction      Ankle dorsiflexion (L4)      Ankle plantarflexion (S1)      Ankle inversion      Ankle eversion      Great Toe ext (L5)      Grossly  3 (D)* 4    (Blank rows = not tested, score listed is out of 5 possible points.  N = WNL, D = diminished, C = clear for gross weakness with myotome testing, * = concordant pain with testing)     LE ROM:   ROM  Right 07/26/2021 Left 07/26/2021  Hip flexion      Hip extension      Hip abduction      Hip adduction      Hip internal rotation      Hip external rotation      Knee flexion      Knee extension      Ankle dorsiflexion  Ankle plantarflexion      Ankle inversion      Ankle eversion         (Blank rows = not tested, N = WNL, * = concordant pain with testing)    LUMBAR SPECIAL TESTS:  Straight leg raise: L (-), R (+) Slump: L (-), R (+)    PALPATION:            TTP throughout R lumbar spine    DIRECTIONAL PREFERENCE:           Possible R lateral shift correction   FUNCTIONAL TESTS:  30'' STS: 7 reps with UE   GAIT: Antalgic with reduced time in stance on R   PATIENT SURVEYS:  FOTO 34 -> 51   ASTERISK SIGNS   Asterisk Signs 2/18 2/21      30'' STS        balance 20'' tandem on foam 45'' tandem on foam (R in rear)      R LE strength                               TODAY'S TREATMENT   Treatment: DATE: 08/13/2021  Therapeutic Exercise: Bike L3 x 5 min while taking subjective STS - 3x10 from blue cair + airex + 10# KB Standing hip abd - 25# 2x10 Standing hip ext cybex - 25# - x10 Cybex leg press - 3x10 @ 70# - seat 8#  NRE: Tandem on foam 3x45'' R in rear Rocker board - blue - 2x20x  Treatment: DATE: 08/10/2021  Therapeutic Exercise: Bike L2 x 5 min while taking subjective STS - 3x10 from blue cair + airex + 5# KB Standing hip abd - 17.5# 2x10 Standing hip ext cybex - 17.5# - x10 Cybex leg press - 3x10 @ 60#  NRE: Tandem on foam 3x45'' R in rear  Treatment: DATE: 08/08/2021  Therapeutic Exercise: Bike L2 UE/LE x 5 min while taking subjective Mini squats with UE support x 10 Standing hip abd/ext x 10 Ball roll outs blue PB forward/side x10 each LTR - 20x Hooklying alternating clamshell Blue TB 2x10 Supine hip adduction pilates ring 2x10 3'' hold Bridge 2x10 5'' hold Supine hamstring stretch with strap x 30 sec BIL + nerve glide Alternating  SLR from foam roller - 3x10  Therapeutic Activity - collecting information for FOTO, checking progress, and reviewing with patient  [facility] Adult PT Treatment:                                                DATE: 08/05/2021 Therapeutic Exercise: Nustep level 6 UE/LE x 5 min while taking subjective Mini squats with UE support x 10 Standing hip abd/ext x 10 Ball roll outs blue PB forward/side x10 each Hooklying clamshell BTB 2x15 Supine hip adduction pilates ring 2x10 Bridge 2x10 Supine hamstring stretch with strap x 30 sec BIL Supine 90/90 hip flexion feet on pball 2x15 Supine sciatic nerve glide 2x15 R LE LTR x10 BIL  [facility] Adult PT Treatment:                                                DATE: 08/03/2021 Therapeutic Exercise: Nustep level  5 x 5 mins Mini squats with UE support x 10, verbal cues for proper form Ball roll outs blue PB forward/side x10 each Seated march 2# 2 x10 BIL LAQ 2# x 10 BIL Hooklying clamshell GTB 2 x10 Supine hip adduction ball squeeze 2 x 10 Bridge x 10 Supine hamstring stretch with strap x 30 sec BIL LTR x10 BIL Neuromuscular re-ed: Tandem stance eyes closed x 30 sec ea SLS x 30 sec L, x 25 sec R before LOB SLS cone taps with single UE support 2 x 30 sec BIL   [facility] Adult PT Treatment:                                                DATE: 08/02/2021 Therapeutic Exercise: Nustep level 5 x 5 mins Step ups forward/lateral 4" step x 10 each Mini squats with UE support x 10 LAQ x 10 BIL Hooklying clamshell GTB 2 x10 Supine hip adduction ball squeeze 2 x 10 Bridge 2 x 10 LTR x10 BIL Ball roll outs blue PB forward/side x10 each Seated march 2# x10 BIL STS x 10 Neuromuscular re-ed: Romberg stance with head turns x 30 sec Romberg stance eyes closed 2 x 30 sec Tandem stance eyes open x 30 sec ea SLS x 20 sec L, x 10 sec R before LOB   PATIENT EDUCATION:  HEP updated     HOME EXERCISE PROGRAM: Access Code: [email or web address] URL:  [email or web address] Date: 07/31/2021 Prepared by: [place] with Resistance - 2 x daily - 7 x weekly - 3 sets - 10 reps Supine Hip Adduction Isometric with Ball - 2 x daily - 7 x weekly - 2 sets - 10 reps - 10'' hold Seated Sciatic Tensioner - 2 x daily - 7 x weekly - 20 reps Supine Posterior Pelvic Tilt - 2 x daily - 7 x weekly - 2 sets - 10 reps - 5'' hold Added 07/31/2021 Supine Bridge - 1 x daily - 7 x weekly - 3 sets - 10 reps Added 08/05/2021 Supine Sciatic Nerve Glide - 1 x daily - 7 x weekly - 2 sets - 15 reps      ASSESSMENT: [place] is progressing well with therapy.  Pt reports no increase in baseline pain following therapy.  Today we concentrated on core strengthening, hip strengthening, and balance/proprioception.  Pt with clear improvement in balance today with tandem on foam to 45''.  He continues to show progress with hip and core strengthening, but is still having high levels of pain.  Pt will continue to benefit from skilled physical therapy to address remaining deficits and achieve listed goals.  Continue per POC.   GOALS:   SHORT TERM GOALS:   STG Name Target Date Goal status  1 Rav[place] will be >75% HEP compliant to improve carryover between sessions and facilitate independent management of condition   Baseline: No HEP 08/16/2021 MET    LONG TERM GOALS:    LTG Name Target Date Goal status  1 Yovanni will improve FOTO score from 34 (baseline) to 51 as a proxy for functional improvement  2/16: 47 09/20/2021 Ongoing  2 Bharat will improve 30'' STS (MCID 2) to >/= 4x (w/ UE?: y) to show improved LE strength and improved transfers    Baseline: 7x  w/ UE? Y 09/20/2021 INITIAL  3 Charli will report >/=  50% decrease in pain from evaluation    Baseline: 8/10 max pain 09/20/2021 INITIAL  4 Jarel will be able to stand for 50 min while making a meal, not limited by pain    Baseline: 30 min 09/20/2021 INITIAL    PLAN: PT  FREQUENCY: 1-3x/week   PT DURATION: 8 weeks (Ending 09/20/2021)   PLANNED INTERVENTIONS: Therapeutic exercises, Therapeutic activity, Neuro Muscular re-education, Gait training, Patient/Family education, Joint mobilization, Dry Needling, Electrical stimulation, Spinal mobilization and/or manipulation, Moist heat, Taping, Vasopneumatic device, Ionotophoresis 42m/ml Dexamethasone, and Manual therapy   PLAN FOR NEXT SESSION: Walking program information, see if correction of R lateral shift has an effect, core strengthening, dural stretching, possibly using the cane, assess HEP     KMathis Dad PT 08/13/2021, 2:02 PM

## 2021-08-15 ENCOUNTER — Ambulatory Visit: Payer: Medicare Other | Admitting: Physical Therapy

## 2021-08-17 ENCOUNTER — Ambulatory Visit: Payer: Medicare Other | Admitting: Physical Therapy

## 2021-08-20 ENCOUNTER — Ambulatory Visit: Payer: Medicare Other | Admitting: Physical Therapy

## 2021-08-22 ENCOUNTER — Ambulatory Visit: Payer: Medicare Other | Admitting: Physical Therapy

## 2021-08-24 ENCOUNTER — Encounter: Payer: Medicare Other | Admitting: Physical Therapy

## 2022-07-04 ENCOUNTER — Other Ambulatory Visit: Payer: Self-pay

## 2022-07-04 ENCOUNTER — Encounter (HOSPITAL_COMMUNITY): Payer: Self-pay

## 2022-07-04 ENCOUNTER — Observation Stay (HOSPITAL_COMMUNITY)
Admission: EM | Admit: 2022-07-04 | Discharge: 2022-07-05 | Disposition: A | Discharge status: Home / Self Care | Payer: Medicare Other | Attending: Family Medicine | Admitting: Family Medicine

## 2022-07-04 ENCOUNTER — Emergency Department (HOSPITAL_COMMUNITY): Payer: Medicare Other

## 2022-07-04 DIAGNOSIS — R7989 Other specified abnormal findings of blood chemistry: Secondary | ICD-10-CM | POA: Diagnosis not present

## 2022-07-04 DIAGNOSIS — R197 Diarrhea, unspecified: Secondary | ICD-10-CM | POA: Diagnosis not present

## 2022-07-04 DIAGNOSIS — I480 Paroxysmal atrial fibrillation: Principal | ICD-10-CM | POA: Insufficient documentation

## 2022-07-04 DIAGNOSIS — Z79899 Other long term (current) drug therapy: Secondary | ICD-10-CM | POA: Insufficient documentation

## 2022-07-04 DIAGNOSIS — N179 Acute kidney failure, unspecified: Secondary | ICD-10-CM | POA: Diagnosis not present

## 2022-07-04 DIAGNOSIS — R911 Solitary pulmonary nodule: Secondary | ICD-10-CM | POA: Diagnosis not present

## 2022-07-04 DIAGNOSIS — R059 Cough, unspecified: Secondary | ICD-10-CM | POA: Insufficient documentation

## 2022-07-04 DIAGNOSIS — R0602 Shortness of breath: Secondary | ICD-10-CM | POA: Diagnosis not present

## 2022-07-04 DIAGNOSIS — I482 Chronic atrial fibrillation, unspecified: Secondary | ICD-10-CM | POA: Diagnosis present

## 2022-07-04 DIAGNOSIS — Z87891 Personal history of nicotine dependence: Secondary | ICD-10-CM | POA: Diagnosis not present

## 2022-07-04 DIAGNOSIS — R0609 Other forms of dyspnea: Secondary | ICD-10-CM

## 2022-07-04 DIAGNOSIS — Z85828 Personal history of other malignant neoplasm of skin: Secondary | ICD-10-CM | POA: Insufficient documentation

## 2022-07-04 DIAGNOSIS — R0789 Other chest pain: Secondary | ICD-10-CM

## 2022-07-04 DIAGNOSIS — U071 COVID-19: Secondary | ICD-10-CM | POA: Diagnosis not present

## 2022-07-04 DIAGNOSIS — K219 Gastro-esophageal reflux disease without esophagitis: Secondary | ICD-10-CM | POA: Diagnosis present

## 2022-07-04 DIAGNOSIS — I4891 Unspecified atrial fibrillation: Secondary | ICD-10-CM | POA: Diagnosis present

## 2022-07-04 DIAGNOSIS — Z9049 Acquired absence of other specified parts of digestive tract: Secondary | ICD-10-CM | POA: Diagnosis not present

## 2022-07-04 DIAGNOSIS — R634 Abnormal weight loss: Secondary | ICD-10-CM

## 2022-07-04 LAB — CBC
HCT: 38.8 % — ABNORMAL LOW (ref 39.0–52.0)
Hemoglobin: 12.7 g/dL — ABNORMAL LOW (ref 13.0–17.0)
MCH: 29.4 pg (ref 26.0–34.0)
MCHC: 32.7 g/dL (ref 30.0–36.0)
MCV: 89.8 fL (ref 80.0–100.0)
Platelets: 288 10*3/uL (ref 150–400)
RBC: 4.32 MIL/uL (ref 4.22–5.81)
RDW: 13.9 % (ref 11.5–15.5)
WBC: 7.8 10*3/uL (ref 4.0–10.5)
nRBC: 0 % (ref 0.0–0.2)

## 2022-07-04 LAB — RESP PANEL BY RT-PCR (RSV, FLU A&B, COVID)  RVPGX2
Influenza A by PCR: NEGATIVE
Influenza B by PCR: NEGATIVE
Resp Syncytial Virus by PCR: NEGATIVE
SARS Coronavirus 2 by RT PCR: POSITIVE — AB

## 2022-07-04 LAB — MAGNESIUM: Magnesium: 2.2 mg/dL (ref 1.7–2.4)

## 2022-07-04 LAB — BASIC METABOLIC PANEL
Anion gap: 7 (ref 5–15)
BUN: 21 mg/dL (ref 8–23)
CO2: 22 mmol/L (ref 22–32)
Calcium: 9.3 mg/dL (ref 8.9–10.3)
Chloride: 109 mmol/L (ref 98–111)
Creatinine, Ser: 1.36 mg/dL — ABNORMAL HIGH (ref 0.61–1.24)
GFR, Estimated: 53 mL/min — ABNORMAL LOW (ref >60)
Glucose, Bld: 109 mg/dL — ABNORMAL HIGH (ref 70–99)
Potassium: 4.7 mmol/L (ref 3.5–5.1)
Sodium: 138 mmol/L (ref 135–145)

## 2022-07-04 LAB — TROPONIN I (HIGH SENSITIVITY)
Troponin I (High Sensitivity): 7 ng/L (ref <18)
Troponin I (High Sensitivity): 10 ng/L (ref <18)

## 2022-07-04 LAB — BRAIN NATRIURETIC PEPTIDE: B Natriuretic Peptide: 213.2 pg/mL — ABNORMAL HIGH (ref 0.0–100.0)

## 2022-07-04 MED ORDER — METOPROLOL TARTRATE 5 MG/5ML IV SOLN
5.0000 mg | Freq: Once | INTRAVENOUS | Status: AC
  Administered 2022-07-04: 5 mg via INTRAVENOUS
  Filled 2022-07-04: qty 5

## 2022-07-04 NOTE — ED Triage Notes (Signed)
Pt bib ems from [facility] physicians; ems reports pt endorses hx afib but PCP says pt does not have hx afib; elevation in v3 and v4 with ems; ems consulted EDP, code stemi not initiated; pt afib/flutter with ems; pt went to PCP today for evaluation of diarrhea x 6 months; pt endorses 35 lb weight loss over past 6 months;  diagnosed with pneumonia last month, recently finished abx; endorses recent cough, swelling to bilateral ankles, and intermittent "chest weakness"; pt given 324 asa with ems; pt endorses allergy to asa ("the doctor says not to take it", but states he takes 81 mg daily; 18 ga lac; 140/80, HR 110, 99% RA; placed on 2L for comfort); cbg 120, RR 18

## 2022-07-04 NOTE — ED Provider Triage Note (Signed)
Emergency Medicine Provider Triage Evaluation Note  NEWEL OIEN , a 82 y.o. male  was evaluated in triage.  Pt complains of shortness of breath with exertion, "chest weakness" since this morning. Found to be in Afib at [facility] physicians, no previous history of same. Does not take blood thinners. Given 324ASA with ems. Denies nausea, vomiting.  Review of Systems  Positive: Shortness of breath Negative: Chest pain, nausea, vomiting  Physical Exam  BP 120/81   Pulse 89   Temp 97.8 F (36.6 C)   Resp 15   SpO2 100%  Gen:   Awake, no distress  Resp:  Normal effort  MSK:   Moves extremities without difficulty  Other:  Irregularly irregular heart rhyhthm  Medical Decision Making  Medically screening exam initiated at 4:13 PM.  Appropriate orders placed.  SAMI FROH was informed that the remainder of the evaluation will be completed by another provider, this initial triage assessment does not replace that evaluation, and the importance of remaining in the ED until their evaluation is complete.  Workup initiated   Anselmo Pickler, [place] 07/04/22 1619

## 2022-07-04 NOTE — Assessment & Plan Note (Signed)
-  Creatinine elevated 1.36 with no recent prior for comparison.  Will give IV fluids and follow in the morning.

## 2022-07-04 NOTE — ED Provider Notes (Signed)
[facility] EMERGENCY DEPARTMENT Provider Note   CSN: [id number] Arrival date & time: 07/04/22  1600     History  Chief Complaint  Patient presents with   Atrial Fibrillation    LIEV BROCKBANK is a 82 y.o. male.  Pt with hx afib c/o feeling generally weak, fatigued, sob, vague chest discomfort in the past two weeks. Denies having sense of palpitations. Symptoms worse w exertion.  Pt unaware of hx afib, but noted in chart. No anticoagulant use. +non prod cough. No sore throat. No fever or chills. Has had a intermittent loose/diarrheal stools over the course of the past 6 months, no vomiting - no acute or abrupt worsening of diarrhea. Notes ~ 35 lb wt loss in past 6+ months. No abd pain. No dysuria. Denies recent change in meds or new meds. States was at [facility] office today and  EMS was called to bring to ED.   The history is provided by the patient, the spouse, medical records and the EMS personnel. The history is limited by the condition of the patient.  Atrial Fibrillation Associated symptoms include shortness of breath. Pertinent negatives include no abdominal pain and no headaches.       Home Medications Prior to Admission medications   Medication Sig Start Date End Date Taking? Authorizing Provider  Ascorbic Acid (VITAMIN C) 1000 MG tablet Take 1,000 mg by mouth at bedtime.     [provider]  bisacodyl (DULCOLAX) 10 MG suppository Place 1 suppository (10 mg total) rectally daily as needed for moderate constipation. 02/26/19   Debbe Odea, MD  Cholecalciferol (VITAMIN D3 PO) Take 1 tablet by mouth at bedtime.     [provider]  Glycerin-Hypromellose-PEG 400 ([employer] DRY EYE RELIEF) 0.2-0.2-1 % SOLN Place 2 drops into both eyes as needed (dry eyes).    [provider]  HYDROcodone-acetaminophen (NORCO) 10-325 MG tablet Take 1 tablet by mouth 3 (three) times daily as needed. 03/21/18   [provider]  methocarbamol (ROBAXIN)  500 MG tablet Take 1,000 mg by mouth 2 (two) times daily. 03/21/18   [provider]  metoprolol tartrate (LOPRESSOR) 25 MG tablet Take 1 tablet (25 mg total) by mouth 2 (two) times daily. 02/26/19   Debbe Odea, MD  morphine (MS CONTIN) 30 MG 12 hr tablet Take 30 mg by mouth every 12 (twelve) hours. 01/20/18   [provider]  Multiple Vitamins-Minerals (PRESERVISION AREDS 2 PO) Take 1 tablet by mouth at bedtime.     [provider]  polyethylene glycol (MIRALAX / GLYCOLAX) 17 g packet Take 17 g by mouth 2 (two) times daily. 02/26/19   Debbe Odea, MD  sodium phosphate (FLEET) 7-19 GM/118ML ENEM Place 133 mLs (1 enema total) rectally daily as needed for severe constipation. 02/26/19   Debbe Odea, MD  tamsulosin (FLOMAX) 0.4 MG CAPS capsule Take 0.4 mg by mouth at bedtime.    [provider]  vitamin E 400 UNIT capsule Take 400 Units by mouth at bedtime.     [provider]  zinc gluconate 50 MG tablet Take 50 mg by mouth daily.    [provider]      Allergies    Patient has no known allergies.    Review of Systems   Review of Systems  Constitutional:  Negative for chills and fever.  HENT:  Negative for sore throat.   Eyes:  Negative for redness.  Respiratory:  Positive for cough and shortness of breath.  Cardiovascular:  Negative for palpitations and leg swelling.  Gastrointestinal:  Positive for diarrhea. Negative for abdominal pain and vomiting.  Genitourinary:  Negative for dysuria and flank pain.  Musculoskeletal:  Negative for back pain and neck pain.  Skin:  Negative for rash.  Neurological:  Negative for headaches.  Hematological:  Does not bruise/bleed easily.  Psychiatric/Behavioral:  Negative for confusion.     Physical Exam Updated Vital Signs BP 131/83   Pulse 79   Temp 97.7 F (36.5 C)   Resp 14   Ht 1.778 m ('5\' 10"'$ )   Wt 63.5 kg   SpO2 100%   BMI 20.09 kg/m  Physical Exam Vitals and nursing note  reviewed.  Constitutional:      Appearance: Normal appearance. He is well-developed.  HENT:     Head: Atraumatic.     Nose: Nose normal.     Mouth/Throat:     Mouth: Mucous membranes are moist.     Pharynx: Oropharynx is clear.  Eyes:     General: No scleral icterus.    Conjunctiva/sclera: Conjunctivae normal.     Pupils: Pupils are equal, round, and reactive to light.  Neck:     Trachea: No tracheal deviation.  Cardiovascular:     Rate and Rhythm: Normal rate. Rhythm irregular.     Pulses: Normal pulses.     Heart sounds: Normal heart sounds. No murmur heard.    No friction rub. No gallop.  Pulmonary:     Effort: Pulmonary effort is normal. No accessory muscle usage or respiratory distress.     Breath sounds: Normal breath sounds.  Abdominal:     General: There is no distension.     Palpations: Abdomen is soft.     Tenderness: There is no abdominal tenderness.  Genitourinary:    Comments: No cva tenderness. Musculoskeletal:        General: No swelling or tenderness.     Cervical back: Normal range of motion and neck supple. No rigidity.  Skin:    General: Skin is warm and dry.     Findings: No rash.  Neurological:     Mental Status: He is alert.     Comments: Alert, speech clear.   Psychiatric:        Mood and Affect: Mood normal.     ED Results / Procedures / Treatments   Labs (all labs ordered are listed, but only abnormal results are displayed) Results for orders placed or performed during the hospital encounter of 89/21/19  Basic metabolic panel  Result Value Ref Range   Sodium 138 135 - 145 mmol/L   Potassium 4.7 3.5 - 5.1 mmol/L   Chloride 109 98 - 111 mmol/L   CO2 22 22 - 32 mmol/L   Glucose, Bld 109 (H) 70 - 99 mg/dL   BUN 21 8 - 23 mg/dL   Creatinine, Ser 1.36 (H) 0.61 - 1.24 mg/dL   Calcium 9.3 8.9 - 10.3 mg/dL   GFR, Estimated 53 (L) >60 mL/min   Anion gap 7 5 - 15  CBC  Result Value Ref Range   WBC 7.8 4.0 - 10.5 K/uL   RBC 4.32 4.22 - 5.81  MIL/uL   Hemoglobin 12.7 (L) 13.0 - 17.0 g/dL   HCT 38.8 (L) 39.0 - 52.0 %   MCV 89.8 80.0 - 100.0 fL   MCH 29.4 26.0 - 34.0 pg   MCHC 32.7 30.0 - 36.0 g/dL   RDW 13.9 11.5 - 15.5 %   Platelets  288 150 - 400 K/uL   nRBC 0.0 0.0 - 0.2 %  Brain natriuretic peptide  Result Value Ref Range   B Natriuretic Peptide 213.2 (H) 0.0 - 100.0 pg/mL  Magnesium  Result Value Ref Range   Magnesium 2.2 1.7 - 2.4 mg/dL  Troponin I (High Sensitivity)  Result Value Ref Range   Troponin I (High Sensitivity) 7 <18 ng/L  Troponin I (High Sensitivity)  Result Value Ref Range   Troponin I (High Sensitivity) 10 <18 ng/L   DG Chest 2 View  Result Date: 07/04/2022 CLINICAL DATA:  Shortness of breath, AFib. EXAM: CHEST - 2 VIEW COMPARISON:  Chest radiograph 01/19/2013 FINDINGS: The cardiomediastinal silhouette is normal. The lungs are hyperinflated and hyperlucent suggesting underlying COPD. There is no focal consolidation or pulmonary edema. There is no pleural effusion or pneumothorax. There is small nodular opacity in the lateral right upper lobe. There is no acute osseous abnormality. Cervical spine fusion hardware is noted. IMPRESSION: 1. Hyperinflated and hyperlucent lungs suggesting underlying COPD. No focal consolidation or pleural effusion. 2. Small nodular opacity in the lateral right upper lobe. Recommend nonemergent CT chest for further evaluation. Electronically Signed   By: Valetta Mole M.D.   On: 07/04/2022 16:54     EKG EKG Interpretation  Date/Time:  Friday July 04 2022 16:03:16 EST Ventricular Rate:  87 PR Interval:    QRS Duration: 90 QT Interval:  360 QTC Calculation: 433 R Axis:   -62 Text Interpretation: Atrial fibrillation Left anterior fascicular block Nonspecific T wave abnormality Confirmed by Lajean Saver [phone]) on 07/04/2022 9:45:32 PM  Radiology DG Chest 2 View  Result Date: 07/04/2022 CLINICAL DATA:  Shortness of breath, AFib. EXAM: CHEST - 2 VIEW COMPARISON:  Chest  radiograph 01/19/2013 FINDINGS: The cardiomediastinal silhouette is normal. The lungs are hyperinflated and hyperlucent suggesting underlying COPD. There is no focal consolidation or pulmonary edema. There is no pleural effusion or pneumothorax. There is small nodular opacity in the lateral right upper lobe. There is no acute osseous abnormality. Cervical spine fusion hardware is noted. IMPRESSION: 1. Hyperinflated and hyperlucent lungs suggesting underlying COPD. No focal consolidation or pleural effusion. 2. Small nodular opacity in the lateral right upper lobe. Recommend nonemergent CT chest for further evaluation. Electronically Signed   By: Valetta Mole M.D.   On: 07/04/2022 16:54    Procedures Procedures    Medications Ordered in ED Medications  metoprolol tartrate (LOPRESSOR) injection 5 mg (has no administration in time range)    ED Course/ Medical Decision Making/ A&P                             Medical Decision Making Problems Addressed: Chest discomfort: acute illness or injury with systemic symptoms that poses a threat to life or bodily functions Diarrhea, unspecified type: chronic illness or injury Dyspnea on exertion: acute illness or injury with systemic symptoms that poses a threat to life or bodily functions PAF (paroxysmal atrial fibrillation) ([place]): acute illness or injury with systemic symptoms that poses a threat to life or bodily functions Weight loss: chronic illness or injury  Amount and/or Complexity of Data Reviewed Independent Historian: spouse and EMS    Details: hx External Data Reviewed: labs, ECG and notes. Labs: ordered. Decision-making details documented in ED Course. Radiology: ordered and independent interpretation performed. Decision-making details documented in ED Course. ECG/medicine tests: ordered and independent interpretation performed. Decision-making details documented in ED Course. Discussion of management or  test interpretation with external  provider(s): medicine  Risk Prescription drug management. Decision regarding hospitalization.   Iv ns. Continuous pulse ox and cardiac monitoring. Labs ordered/sent. Imaging ordered.   Differential diagnosis includes  . Dispo decision including potential need for admission considered - will get labs and imaging and reassess.   Reviewed nursing notes and prior charts for additional history. External reports reviewed. Additional history from: EMS/spouse.   Cardiac monitor: afib, rate 80.  Labs reviewed/interpreted by me - chem normal. Hct normal.   Xrays reviewed/interpreted by me - no pna.   Pt in afib, unclear when started, has felt weak for 2 weeks with chest feeling weak and sob. No current  anticoag. Chadsvasc score 2.   Hospitalists consulted for admission.          Final Clinical Impression(s) / ED Diagnoses Final diagnoses:  None    Rx / DC Orders ED Discharge Orders     None         Lajean Saver, MD 07/04/22 2208

## 2022-07-04 NOTE — Assessment & Plan Note (Signed)
-  Incidental finding of nodular opacity to the RUL.  Nonemergent CT chest recommended.

## 2022-07-04 NOTE — H&P (Signed)
History and Physical    Patient: Kent [name] OFB:[id number] DOB: 10-27-1940 DOA: 07/04/2022 DOS: the patient was seen and examined on 07/05/2022 PCP: Kent Frees, MD  Patient coming from: Home  Chief Complaint:  Chief Complaint  Patient presents with   Atrial Fibrillation   HPI: ANTAEUS [name] is a 82 y.o. male with medical history significant of paroxysmal atrial fibrillation not on anticoagulation, subtotal right colectomy, SBO, GERD who presents from primary care office for atrial fibrillation.  Patient presented to primary care office today with concerns of fatigue, weakness, uncomfortable chest discomfort and was noted to be in atrial fibrillation on EKG and sent to ED. He also reports 2 weeks of persistent diarrhea without abdominal pain. Had sinus infection last week and just finished antibiotics. No fever.   Patient not aware of history of atrial fibrillation however during a hospitalization in 2020 for SBO he was noted to have transient atrial fibrillation.  He was advised to follow-up with cardiac monitoring with cardiology but declined appointments.  No anticoagulation was not restarted.   In the ED, he was afebrile, in atrial fibrillation but rate control with heart rate of 70, normotensive on room air.  No leukocytosis or anemia.  No significant electrolyte abnormalities with normal potassium and magnesium.  Creatinine was elevated at 1.36 with no recent prior for comparison.  Troponin of 7.  BNP of 213.  Chest x-ray is consistent with findings of COPD.  There is also nodular opacity of the right upper lobe with nonemergent CT chest recommended.  COVID PCR positive.  He was given IV 5 mg of Lopressor in the ED. Hospitalist then consulted for admission.   Review of Systems: As mentioned in the history of present illness. All other systems reviewed and are negative. 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       Cancer ([place])    basal  cell cancer - skin   Chronic back pain    s/p back surgery   GERD (gastroesophageal reflux disease) 08/18/2013   Incisional hernia, without obstruction or gangrene 02/02/2013   Postural dizziness with presyncope 09/01/2016   Sinus infection    currently on antibiotics   Small bowel obstruction due to adhesions [facility])    Past Surgical History:  Procedure Laterality Date   BACK SURGERY  08/25/2001   Neck 3 disk replacements   BACK SURGERY     Placement/removal of nerve stimulator   CHOLECYSTECTOMY N/A 10/16/2014   Procedure: LAPAROSCOPIC CHOLECYSTECTOMY WITH ATTEMPTED CHOLANGIOGRAM;  Surgeon: Kent Skeans, MD;  Location: [place];  Service: General;  Laterality: N/A;   COLON SURGERY  04/2002   Dr. Deon [name] - Subtotal colectomy, ileostomy   ELBOW SURGERY Right    for blood vessel & " hung" nerve   ESOPHAGEAL DILATION     EXPLORATORY LAPAROTOMY  03/10/2017   HERNIA REPAIR     Ileostomy Takedown  08/2002   Dr. Deon [name]   INCISION AND DRAINAGE FOOT     INCISIONAL HERNIA REPAIR N/A 01/20/2013   Procedure: HERNIA REPAIR INCISIONAL;  Surgeon: Kent Jarred, MD;  Location: [place];  Service: General;  Laterality: N/A;   [place]  04/13/2014   with mesh    dr Kent [name] HERNIA REPAIR N/A 04/13/2014   Procedure: OPEN REPAIR INCISIONAL HERNIA ;  Surgeon: Kent Skeans, MD;  Location: [place];  Service: General;  Laterality: N/A;   INSERTION OF MESH N/A 04/13/2014  Procedure: INSERTION OF MESH;  Surgeon: Kent Skeans, MD;  Location: [place];  Service: General;  Laterality: N/A;   KNEE ARTHROSCOPY Right 2010   LAPAROSCOPIC CHOLECYSTECTOMY  10/16/2014   LAPAROTOMY N/A 01/20/2013   Procedure: EXPLORATORY LAPAROTOMY;  Surgeon: Kent Jarred, MD;  Location: [place];  Service: General;  Laterality: N/A;   LAPAROTOMY N/A 03/10/2017   Procedure: EXPLORATORY LAPAROTOMY;  Surgeon: Kent Skeans, MD;  Location: [place];  Service: General;  Laterality: N/A;   Laparotomy with lysis of  adhesions  2010   Dr. Grandville [name] - SBO   LYSIS OF ADHESION N/A 01/20/2013   Procedure: LYSIS OF ADHESION;  Surgeon: Kent Jarred, MD;  Location: [place];  Service: General;  Laterality: N/A;   LYSIS OF ADHESION N/A 03/10/2017   Procedure: LYSIS OF ADHESION;  Surgeon: Kent Skeans, MD;  Location: [place];  Service: General;  Laterality: N/A;   microsurgical  03/24/2002   Lateral recess decompression L5   SACRAL NERVE STIMULATOR PLACEMENT     inserted and removed   SHOULDER ARTHROSCOPY Bilateral    VASECTOMY     Social History:  reports that he has quit smoking. He quit smokeless tobacco use about 36 years ago.  His smokeless tobacco use included chew. He reports that he does not drink alcohol and does not use drugs.  No Known Allergies  Family History  Problem Relation Age of Onset   Heart disease Mother    Brain cancer Father     Prior to Admission medications   Medication Sig Start Date End Date Taking? Authorizing Provider  Ascorbic Acid (VITAMIN C) 1000 MG tablet Take 1,000 mg by mouth at bedtime.     [provider]  bisacodyl (DULCOLAX) 10 MG suppository Place 1 suppository (10 mg total) rectally daily as needed for moderate constipation. 02/26/19   Kent Odea, MD  Cholecalciferol (VITAMIN D3 PO) Take 1 tablet by mouth at bedtime.     [provider]  Glycerin-Hypromellose-PEG 400 ([employer] DRY EYE RELIEF) 0.2-0.2-1 % SOLN Place 2 drops into both eyes as needed (dry eyes).    [provider]  HYDROcodone-acetaminophen (NORCO) 10-325 MG tablet Take 1 tablet by mouth 3 (three) times daily as needed. 03/21/18   [provider]  methocarbamol (ROBAXIN) 500 MG tablet Take 1,000 mg by mouth 2 (two) times daily. 03/21/18   [provider]  metoprolol tartrate (LOPRESSOR) 25 MG tablet Take 1 tablet (25 mg total) by mouth 2 (two) times daily. 02/26/19   Kent Odea, MD  morphine (MS CONTIN) 30 MG 12 hr tablet Take 30 mg by mouth every 12 (twelve)  hours. 01/20/18   [provider]  Multiple Vitamins-Minerals (PRESERVISION AREDS 2 PO) Take 1 tablet by mouth at bedtime.     [provider]  polyethylene glycol (MIRALAX / GLYCOLAX) 17 g packet Take 17 g by mouth 2 (two) times daily. 02/26/19   Kent Odea, MD  sodium phosphate (FLEET) 7-19 GM/118ML ENEM Place 133 mLs (1 enema total) rectally daily as needed for severe constipation. 02/26/19   Kent Odea, MD  tamsulosin (FLOMAX) 0.4 MG CAPS capsule Take 0.4 mg by mouth at bedtime.    [provider]  vitamin E 400 UNIT capsule Take 400 Units by mouth at bedtime.     [provider]  zinc gluconate 50 MG tablet Take 50 mg by mouth daily.    [provider]    Physical Exam: Vitals:   07/04/22 2300 07/04/22 2315  07/04/22 2330 07/04/22 2345  BP: 117/79 115/76  (!) 142/104  Pulse: 71 71  70  Resp: (!) '23 20  15  '$ Temp:   97.7 F (36.5 C)   TempSrc:   Oral   SpO2: 99% 100%  100%  Weight:      Height:       Constitutional: NAD, calm, comfortable, nontoxic well-appearing elderly male sitting upright in bed Eyes: lids and conjunctivae normal ENMT: Mucous membranes are moist.  Neck: normal, supple Respiratory: clear to auscultation bilaterally, no wheezing, no crackles. Normal respiratory effort. No accessory muscle use.  Cardiovascular: Irregularly irregular rate and rhythm, no murmurs / rubs / gallops. No extremity edema. Abdomen: Soft, non-distended, no tenderness, no masses palpated.  Bowel sounds positive.  Musculoskeletal: no clubbing / cyanosis. No joint deformity upper and lower extremities. Good ROM, no contractures. Normal muscle tone.  Skin: no rashes, lesions, ulcers. No induration Neurologic: CN 2-12 grossly intact.  Strength 5/5 in all 4.  Psychiatric: Normal judgment and insight. Alert and oriented x 3. Normal mood. Data Reviewed:  See HPI  Assessment and Plan: * Atrial fibrillation ([place]) -Paroxysmal atrial fibrillation.   Patient noted to have transient atrial fibrillation in 2020 following SBO but lost to follow-up with cardiology for cardiac monitoring.  He presents today with atrial fibrillation and has been converting intermittently back to normal sinus proving he likely has paroxysmal atrial fibrillation. -CHA2DS2-VASc score of 2. Will start Eliquis for stroke prevention -check TSH -obtain echocardiogram -Keep on continuous telemetry   COVID-19 virus infection - Patient reports 2 weeks of diarrhea which could be from [place].  Will also test GI stool panel and C. difficile. -Patient outside of window for any antiviral treatment  Pulmonary nodule - Incidental finding of nodular opacity to the RUL.  Nonemergent CT chest recommended.  AKI (acute kidney injury) ([employer]) - Creatinine elevated 1.36 with no recent prior for comparison.  Will give IV fluids and follow in the morning.      Advance Care Planning:Full Consults: none  Family Communication: wife at bedside  Severity of Illness: The appropriate patient status for this patient is OBSERVATION. Observation status is judged to be reasonable and necessary in order to provide the required intensity of service to ensure the patient's safety. The patient's presenting symptoms, physical exam findings, and initial radiographic and laboratory data in the context of their medical condition is felt to place them at decreased risk for further clinical deterioration. Furthermore, it is anticipated that the patient will be medically stable for discharge from the hospital within 2 midnights of admission.   Author: Orene Desanctis, DO 07/05/2022 12:22 AM  For on call review [email or web address].

## 2022-07-05 ENCOUNTER — Observation Stay (HOSPITAL_COMMUNITY): Payer: Medicare Other

## 2022-07-05 ENCOUNTER — Observation Stay (HOSPITAL_BASED_OUTPATIENT_CLINIC_OR_DEPARTMENT_OTHER): Payer: Medicare Other

## 2022-07-05 DIAGNOSIS — I4891 Unspecified atrial fibrillation: Secondary | ICD-10-CM | POA: Diagnosis not present

## 2022-07-05 DIAGNOSIS — I480 Paroxysmal atrial fibrillation: Secondary | ICD-10-CM | POA: Diagnosis not present

## 2022-07-05 DIAGNOSIS — I482 Chronic atrial fibrillation, unspecified: Secondary | ICD-10-CM | POA: Diagnosis present

## 2022-07-05 LAB — GASTROINTESTINAL PANEL BY PCR, STOOL (REPLACES STOOL CULTURE)

## 2022-07-05 LAB — BASIC METABOLIC PANEL
Anion gap: 9 (ref 5–15)
BUN: 20 mg/dL (ref 8–23)
CO2: 23 mmol/L (ref 22–32)
Calcium: 8.8 mg/dL — ABNORMAL LOW (ref 8.9–10.3)
Chloride: 107 mmol/L (ref 98–111)
Creatinine, Ser: 1.38 mg/dL — ABNORMAL HIGH (ref 0.61–1.24)
GFR, Estimated: 52 mL/min — ABNORMAL LOW (ref >60)
Glucose, Bld: 97 mg/dL (ref 70–99)
Potassium: 4.1 mmol/L (ref 3.5–5.1)
Sodium: 139 mmol/L (ref 135–145)

## 2022-07-05 LAB — ECHOCARDIOGRAM COMPLETE
AR max vel: 2.34 cm2
AV Area VTI: 2.3 cm2
AV Area mean vel: 2.15 cm2
AV Mean grad: 4 mmHg
AV Peak grad: 7.1 mmHg
Ao pk vel: 1.33 m/s
Height: 70 in
S' Lateral: 2.7 cm
Weight: 2240 oz

## 2022-07-05 LAB — C DIFFICILE QUICK SCREEN W PCR REFLEX
C Diff antigen: NEGATIVE
C Diff interpretation: NOT DETECTED
C Diff toxin: NEGATIVE

## 2022-07-05 LAB — URINALYSIS, ROUTINE W REFLEX MICROSCOPIC
Bacteria, UA: NONE SEEN
Bilirubin Urine: NEGATIVE
Glucose, UA: NEGATIVE mg/dL
Hgb urine dipstick: NEGATIVE
Ketones, ur: NEGATIVE mg/dL
Leukocytes,Ua: NEGATIVE
Nitrite: NEGATIVE
Protein, ur: NEGATIVE mg/dL
Specific Gravity, Urine: 1.019 (ref 1.005–1.030)
pH: 5 (ref 5.0–8.0)

## 2022-07-05 LAB — TSH: TSH: 1.011 u[IU]/mL (ref 0.350–4.500)

## 2022-07-05 MED ORDER — METOPROLOL TARTRATE 25 MG PO TABS
12.5000 mg | ORAL_TABLET | Freq: Two times a day (BID) | ORAL | Status: DC
  Administered 2022-07-05: 12.5 mg via ORAL
  Filled 2022-07-05: qty 1

## 2022-07-05 MED ORDER — LACTATED RINGERS IV BOLUS
500.0000 mL | Freq: Once | INTRAVENOUS | Status: AC
  Administered 2022-07-05: 500 mL via INTRAVENOUS

## 2022-07-05 MED ORDER — METOPROLOL TARTRATE 25 MG PO TABS: 25.0000 mg | ORAL_TABLET | Freq: Two times a day (BID) | ORAL | 1 refills | Status: DC

## 2022-07-05 MED ORDER — APIXABAN 5 MG PO TABS
5.0000 mg | ORAL_TABLET | Freq: Two times a day (BID) | ORAL | Status: DC
  Administered 2022-07-05 (×3): 5 mg via ORAL
  Filled 2022-07-05 (×3): qty 1

## 2022-07-05 MED ORDER — APIXABAN 5 MG PO TABS: 5.0000 mg | ORAL_TABLET | Freq: Two times a day (BID) | ORAL | 1 refills | Status: DC

## 2022-07-05 MED ORDER — METOPROLOL TARTRATE 25 MG PO TABS
25.0000 mg | ORAL_TABLET | Freq: Two times a day (BID) | ORAL | Status: DC
  Administered 2022-07-05: 25 mg via ORAL
  Filled 2022-07-05: qty 1

## 2022-07-05 NOTE — Progress Notes (Signed)
Echo attempted. Patient not in ED37 at 12:08 PM. Will attempt again as schedule permits.

## 2022-07-05 NOTE — Assessment & Plan Note (Signed)
-  Paroxysmal atrial fibrillation.  Patient noted to have transient atrial fibrillation in 2020 following SBO but lost to follow-up with cardiology for cardiac monitoring.  He presents today with atrial fibrillation and has been converting intermittently back to normal sinus proving he likely has paroxysmal atrial fibrillation. -CHA2DS2-VASc score of 2. Will start Eliquis for stroke prevention -check TSH -obtain echocardiogram -Keep on continuous telemetry

## 2022-07-05 NOTE — ED Notes (Signed)
Pt ambulated to the RR and back

## 2022-07-05 NOTE — ED Notes (Signed)
Pt ambulated to the RR.  

## 2022-07-05 NOTE — Progress Notes (Signed)
  Echocardiogram 2D Echocardiogram has been performed.  Kent [name] 07/05/2022, 2:15 PM

## 2022-07-05 NOTE — Progress Notes (Addendum)
PROGRESS NOTE    SHYLOH KRINKE  JAS:[id number] DOB: 29-Jun-1940 DOA: 07/04/2022 PCP: Shirline Frees, MD  Chief Complaint  Patient presents with   Atrial Fibrillation    Brief Narrative:  Kent [name] is Brean Carberry 82 y.o. male with medical history significant of paroxysmal atrial fibrillation not on anticoagulation, subtotal right colectomy, SBO, GERD who presents from primary care office for atrial fibrillation.    Assessment & Plan:   Principal Problem:   Atrial fibrillation ([place]) Active Problems:   AKI (acute kidney injury) ([place])   Pulmonary nodule   COVID-19 virus infection   Atrial fibrillation ([facility]) -Paroxysmal atrial fibrillation.  Patient noted to have transient atrial fibrillation in 2020 following SBO but lost to follow-up with cardiology for cardiac monitoring.  He presents today with atrial fibrillation and has been converting intermittently back to normal sinus proving he likely has paroxysmal atrial fibrillation. -CHA2DS2-VASc score of 2 (age, echo is pended to evaluate EF). Will start Eliquis for stroke prevention -check TSH (wnl) -obtain echocardiogram - pending -> BNP mildly elevated (troponin wnl) -metoprolol -Keep on continuous telemetry    COVID-19 virus infection - GI and C diff ordered if he has diarrhea -- sounds like symptoms may have been going on for Jaice Digioia couple of weeks -- will continue airborne precautions, though I suspect his respiratory illness/flu symptoms over past few weeks are Vennessa Affinito result of this covid infection.   Pulmonary nodule - Incidental finding of nodular opacity to the RUL.  Nonemergent CT chest recommended. -- following chest CT here   Elevated Creatinine - suspect creatinine around 1.3 represents his baseline (was 0.9 in 2020) - will continue to monitor   His med rec doesn't appear to have been completed?  Will follow up with pharmacy.     DVT prophylaxis: eliquis Code Status: full Family Communication: none Disposition:   Status  is: Observation The patient remains OBS appropriate and will d/c before 2 midnights.   Consultants:  none  Procedures:  Pending echo   Antimicrobials:  Anti-infectives (From admission, onward)    None       Subjective: No complaints Asking about home  Objective: Vitals:   07/05/22 0830 07/05/22 0845 07/05/22 0900 07/05/22 0915  BP: 136/73 117/66 127/70 (!) 113/59  Pulse: 83 85 91 88  Resp: '20 10 17 '$ (!) 21  Temp:    97.6 F (36.4 C)  TempSrc:    Oral  SpO2: 100% 100% 100% 100%  Weight:      Height:        Intake/Output Summary (Last 24 hours) at 07/05/2022 0929 Last data filed at 07/05/2022 0128 Gross per 24 hour  Intake 500 ml  Output --  Net 500 ml   Filed Weights   07/04/22 2142  Weight: 63.5 kg    Examination:  General exam: Appears calm and comfortable  Respiratory system: unlabored Cardiovascular system: irregularly irregular, rate topping out around 100's, mostly in 80's-90's Gastrointestinal system: Abdomen is nondistended, soft and nontender.  Central nervous system: Alert and oriented. No focal neurological deficits. Extremities: no LEE  Data Reviewed: I have personally reviewed following labs and imaging studies  CBC: Recent Labs  Lab 07/04/22 1604  WBC 7.8  HGB 12.7*  HCT 38.8*  MCV 89.8  PLT 419    Basic Metabolic Panel: Recent Labs  Lab 07/04/22 1604 07/05/22 0435  NA 138 139  K 4.7 4.1  CL 109 107  CO2 22 23  GLUCOSE 109* 97  BUN 21 20  CREATININE 1.36* 1.38*  CALCIUM 9.3 8.8*  MG 2.2  --     GFR: Estimated Creatinine Clearance: 39 mL/min (Akacia Boltz) (by C-G formula based on SCr of 1.38 mg/dL (H)).  Liver Function Tests: No results for input(s): "AST", "ALT", "ALKPHOS", "BILITOT", "PROT", "ALBUMIN" in the last 168 hours.  CBG: No results for input(s): "GLUCAP" in the last 168 hours.   Recent Results (from the past 240 hour(s))  Resp panel by RT-PCR (RSV, Flu [employer], Covid) Anterior Nasal Swab     Status: Abnormal    Collection Time: 07/04/22  9:58 PM   Specimen: Anterior Nasal Swab  Result Value Ref Range Status   SARS Coronavirus 2 by RT PCR POSITIVE (Willis Kuipers) NEGATIVE Final    Comment: (NOTE) SARS-CoV-2 target nucleic acids are DETECTED.  The SARS-CoV-2 RNA is generally detectable in upper respiratory specimens during the acute phase of infection. Positive results are indicative of the presence of the identified virus, but do not rule out bacterial infection or co-infection with other pathogens not detected by the test. Clinical correlation with patient history and other diagnostic information is necessary to determine patient infection status. The expected result is Negative.  Fact Sheet for Patients: EntrepreneurPulse.com.au  Fact Sheet for Healthcare Providers: IncredibleEmployment.be  This test is not yet approved or cleared by the [place] FDA and  has been authorized for detection and/or diagnosis of SARS-CoV-2 by FDA under an Emergency Use Authorization (EUA).  This EUA will remain in effect (meaning this test can be used) for the duration of  the COVID-19 declaration under Section 564(b)(1) of the [place], 21 U.S.C. section 360bbb-3(b)(1), unless the authorization is terminated or revoked sooner.     Influenza Shatha Hooser by PCR NEGATIVE NEGATIVE Final   Influenza B by PCR NEGATIVE NEGATIVE Final    Comment: (NOTE) The Xpert Xpress SARS-CoV-2/FLU/RSV plus assay is intended as an aid in the diagnosis of influenza from Nasopharyngeal swab specimens and should not be used as Luz Burcher sole basis for treatment. Nasal washings and aspirates are unacceptable for Xpert Xpress SARS-CoV-2/FLU/RSV testing.  Fact Sheet for Patients: EntrepreneurPulse.com.au  Fact Sheet for Healthcare Providers: IncredibleEmployment.be  This test is not yet approved or cleared by the [place] FDA and has been authorized for detection and/or  diagnosis of SARS-CoV-2 by FDA under an Emergency Use Authorization (EUA). This EUA will remain in effect (meaning this test can be used) for the duration of the COVID-19 declaration under Section 564(b)(1) of the Act, 21 U.S.C. section 360bbb-3(b)(1), unless the authorization is terminated or revoked.     Resp Syncytial Virus by PCR NEGATIVE NEGATIVE Final    Comment: (NOTE) Fact Sheet for Patients: EntrepreneurPulse.com.au  Fact Sheet for Healthcare Providers: IncredibleEmployment.be  This test is not yet approved or cleared by the [place] FDA and has been authorized for detection and/or diagnosis of SARS-CoV-2 by FDA under an Emergency Use Authorization (EUA). This EUA will remain in effect (meaning this test can be used) for the duration of the COVID-19 declaration under Section 564(b)(1) of the Act, 21 U.S.C. section 360bbb-3(b)(1), unless the authorization is terminated or revoked.  Performed at [facility], [place] [address], [place], [place] [address]          Radiology Studies: DG Chest 2 View  Result Date: 07/04/2022 CLINICAL DATA:  Shortness of breath, AFib. EXAM: CHEST - 2 VIEW COMPARISON:  Chest radiograph 01/19/2013 FINDINGS: The cardiomediastinal silhouette is normal. The lungs are hyperinflated and hyperlucent suggesting underlying COPD. There is  no focal consolidation or pulmonary edema. There is no pleural effusion or pneumothorax. There is small nodular opacity in the lateral right upper lobe. There is no acute osseous abnormality. Cervical spine fusion hardware is noted. IMPRESSION: 1. Hyperinflated and hyperlucent lungs suggesting underlying COPD. No focal consolidation or pleural effusion. 2. Small nodular opacity in the lateral right upper lobe. Recommend nonemergent CT chest for further evaluation. Electronically Signed   By: Valetta Mole M.D.   On: 07/04/2022 16:54        Scheduled Meds:  apixaban  5 mg  Oral BID   metoprolol tartrate  12.5 mg Oral BID   Continuous Infusions:   LOS: 0 days    Time spent: over 30 min    Fayrene Helper, MD [facility]   To contact the attending provider between 7A-7P or the covering provider during after hours 7P-7A, please log into the web site [email or web address] and access using universal [place] password for that web site. If you do not have the password, please call the hospital operator.  07/05/2022, 9:29 AM

## 2022-07-05 NOTE — Discharge Summary (Signed)
Physician Discharge Summary  Kent [name] KKX:[id number] DOB: Jun 12, 1941 DOA: 07/04/2022  PCP: Shirline Frees, MD  Admit date: 07/04/2022 Discharge date: 07/05/2022  Time spent: 40 minutes  Recommendations for Outpatient Follow-up:  Follow outpatient CBC/CMP  Follow cost of eliquis outpatient, discharged with 30 day card - need to discuss with PCP/afib clinic what he'll do next month (med assistance vs changing to warfarin?) Needs repeat imaging, concern for ILD Needs repeat imaging for pulm nodule below  Discharge Diagnoses:  Principal Problem:   Atrial fibrillation ([place]) Active Problems:   AKI (acute kidney injury) ([place])   Pulmonary nodule   COVID-19 virus infection   Discharge Condition: stable  Diet recommendation: heart healthy  Filed Weights   07/04/22 2142  Weight: 63.5 kg    History of present illness:   [name] is Kent [name] 82 y.o. male with medical history significant of paroxysmal atrial fibrillation not on anticoagulation, subtotal right colectomy, SBO, GERD who presents from primary care office for atrial fibrillation.   Rates controlled today.  Echo with preserved EF.  TSH wnl.  Doing well on metoprolol.  Will discharge with eliquis, needs follow up to discuss affordability.   See below for additional details  Hospital Course:  Assessment and Plan: Atrial fibrillation ([place]) -Paroxysmal atrial fibrillation.  Patient noted to have transient atrial fibrillation in 2020 following SBO but lost to follow-up with cardiology for cardiac monitoring.  -CHA2DS2-VASc score of 2 (age). -check TSH ([facility]l) -echo with preserved EF, 55-60%, no RWMA -will discharge with 25 mg metop BID and eliquis.  Eliquis is 350 dollars.  Will send him with 30 day card.  Discussed importance of discussion with PCP/cards regarding planning next steps - assistance program vs changing medicines, etc.   COVID-19 virus infection -- sounds like symptoms may have been going on for Sherion Dooly couple of  weeks -- seems to be improving in general.  No need for covid specific therapy right now.   Abnormal CT scan Concerning for interstitial lung disease -> recommending CT chest ILD protocol after resolution of acute symptoms Multiple scattered pulmonary nodules -> needs repeat CT at 3-6 months, 18-24 months   Elevated Creatinine - suspect creatinine around 1.3 represents his baseline (was 0.9 in 2020) - will continue to monitor     Procedures:  Echo IMPRESSIONS     1. Left ventricular ejection fraction, by estimation, is 55 to 60%. The  left ventricle has normal function. The left ventricle has no regional  wall motion abnormalities. Left ventricular diastolic function could not  be evaluated.   2. Right ventricular systolic function is normal. The right ventricular  size is normal.   3. Left atrial size was mild to moderately dilated.   4. The mitral valve is normal in structure. Trivial mitral valve  regurgitation. No evidence of mitral stenosis.   5. The aortic valve is tricuspid. Aortic valve regurgitation is not  visualized. Aortic valve sclerosis is present, with no evidence of aortic  valve stenosis.   6. The inferior vena cava is normal in size with greater than 50%  respiratory variability, suggesting right atrial pressure of 3 mmHg.   Consultations: none  Discharge Exam: Vitals:   07/05/22 1523 07/05/22 1600  BP: 93/61 107/68  Pulse: 65 69  Resp: (!) 24 20  Temp:    SpO2: 99% 100%   See progress note for exam Patient did well ambulating with RN, denies SOB Tele with rates in 80s   Discharge Instructions  Amb Referral to AFIB Clinic   Complete by: As directed    Call MD for:  difficulty breathing, headache or visual disturbances   Complete by: As directed    Call MD for:  extreme fatigue   Complete by: As directed    Call MD for:  hives   Complete by: As directed    Call MD for:  persistant dizziness or light-headedness   Complete by: As  directed    Call MD for:  persistant nausea and vomiting   Complete by: As directed    Call MD for:  redness, tenderness, or signs of infection (pain, swelling, redness, odor or green/yellow discharge around incision site)   Complete by: As directed    Call MD for:  severe uncontrolled pain   Complete by: As directed    Call MD for:  temperature >100.4   Complete by: As directed    Diet - low sodium heart healthy   Complete by: As directed    Diet - low sodium heart healthy   Complete by: As directed    Discharge instructions   Complete by: As directed    You were seen for atrial fibrillation.  You've been started on eliquis for stroke prevention.  We also started you on metoprolol to help control your heart rate.   You've improve at this time.  We'll discharge you home with these new medicines.  We'll also recommend you follow up in the atrial fibrillation clinic.  We placed Breandan People referral.  I would expect Fujiko Picazo phone call in the next week or so.  If you don't get Alyria Krack phone call, please call the afib clinic or ask your PCP for Hadlea Furuya referral.    THE ELIQUIS IS EXPENSIVE (350$ for 30 days).  We'll discharge you with Dio Giller DISCOUNT CARD which should take care of ONE MONTH of the medicine.  You'll need to follow up with your PCP and/or the atrial fibrillation clinic to discuss Jenie Parish plan going forward (pharmacy assistance program, etc?).   IT'S EXTREMELY IMPORTANT YOU DISCUSS YOUR PLAN FOR Ting Cage BLOOD THINNER WITH YOUR PCP OR CARDIOLOGY SO WE KNOW HOW YOU'LL AFFORD THIS MEDICINE NEXT MONTH.  IF YOU HAVE ISSUES WITH THE CARD OR COST OF THE ELIQUIS THIS MONTH, PLEASE LET us OR YOUR DOCTOR KNOW AS SOON AS POSSIBLE.  You had an abnormal CT scan.  Some of this maybe related to your recent covid infection, but you warrant follow up imaging.  CT findings were concerning for interstitial lung disease and you should have follow up imaging after your symptoms from covid have completely resolved.  You also had pulmonary nodules.   You should have Amun Stemm repeat CT scan in 3-6 months and possibly another follow up in 18-24 months.  Please discuss these recommendations with your PCP in follow up.  Return for new, recurrent, or worsening symptoms.  Please ask your PCP to request records from this hospitalization so they know what was done and what the next steps will be.  I've [facility] your medicines to the [facility] on [address].   Increase activity slowly   Complete by: As directed    Increase activity slowly   Complete by: As directed       Allergies as of 07/05/2022   No Known Allergies      Medication List     STOP taking these medications    aspirin EC 81 MG tablet       TAKE these medications  apixaban 5 MG Tabs tablet Commonly known as: ELIQUIS Take 1 tablet (5 mg total) by mouth 2 (two) times daily.   metoprolol tartrate 25 MG tablet Commonly known as: LOPRESSOR Take 1 tablet (25 mg total) by mouth 2 (two) times daily.   morphine 30 MG 12 hr tablet Commonly known as: MS CONTIN Take 30 mg by mouth every 12 (twelve) hours.       No Known Allergies    The results of significant diagnostics from this hospitalization (including imaging, microbiology, ancillary and laboratory) are listed below for reference.    Significant Diagnostic Studies: ECHOCARDIOGRAM COMPLETE  Result Date: 07/05/2022    ECHOCARDIOGRAM REPORT   Patient Name:   ARISTOTELIS VILARDI Date of Exam: 07/05/2022 Medical Rec #:  [id number]      Height:       70.0 in Accession #:    [id number]     Weight:       140.0 lb Date of Birth:  1941/03/27      BSA:          1.794 m Patient Age:    89 years       BP:           105/64 mmHg Patient Gender: M              HR:           72 bpm. Exam Location:  Inpatient Procedure: 2D Echo, Cardiac Doppler and Color Doppler Indications:    Atrial fibrillation  History:        Patient has prior history of Echocardiogram examinations, most                 recent 02/25/2019. Risk Factors:Former Smoker.  COVID-19. AKI.                 GERD.  Sonographer:    Clayton Lefort RDCS (AE) Referring Phys: [place]  1. Left ventricular ejection fraction, by estimation, is 55 to 60%. The left ventricle has normal function. The left ventricle has no regional wall motion abnormalities. Left ventricular diastolic function could not be evaluated.  2. Right ventricular systolic function is normal. The right ventricular size is normal.  3. Left atrial size was mild to moderately dilated.  4. The mitral valve is normal in structure. Trivial mitral valve regurgitation. No evidence of mitral stenosis.  5. The aortic valve is tricuspid. Aortic valve regurgitation is not visualized. Aortic valve sclerosis is present, with no evidence of aortic valve stenosis.  6. The inferior vena cava is normal in size with greater than 50% respiratory variability, suggesting right atrial pressure of 3 mmHg. FINDINGS  Left Ventricle: Left ventricular ejection fraction, by estimation, is 55 to 60%. The left ventricle has normal function. The left ventricle has no regional wall motion abnormalities. The left ventricular internal cavity size was normal in size. There is  no left ventricular hypertrophy. Left ventricular diastolic function could not be evaluated due to atrial fibrillation. Left ventricular diastolic function could not be evaluated. Right Ventricle: The right ventricular size is normal. Right ventricular systolic function is normal. Left Atrium: Left atrial size was mild to moderately dilated. Right Atrium: Right atrial size was normal in size. Pericardium: There is no evidence of pericardial effusion. Mitral Valve: The mitral valve is normal in structure. Trivial mitral valve regurgitation. No evidence of mitral valve stenosis. Tricuspid Valve: The tricuspid valve is normal in structure. Tricuspid valve regurgitation is trivial. No evidence  of tricuspid stenosis. Aortic Valve: The aortic valve is tricuspid. Aortic valve  regurgitation is not visualized. Aortic valve sclerosis is present, with no evidence of aortic valve stenosis. Aortic valve mean gradient measures 4.0 mmHg. Aortic valve peak gradient measures 7.1  mmHg. Aortic valve area, by VTI measures 2.30 cm. Pulmonic Valve: The pulmonic valve was not well visualized. Pulmonic valve regurgitation is not visualized. No evidence of pulmonic stenosis. Aorta: The aortic root is normal in size and structure. Venous: The inferior vena cava is normal in size with greater than 50% respiratory variability, suggesting right atrial pressure of 3 mmHg. IAS/Shunts: No atrial level shunt detected by color flow Doppler.  LEFT VENTRICLE PLAX 2D LVIDd:         3.70 cm LVIDs:         2.70 cm LV PW:         1.00 cm LV IVS:        0.80 cm LVOT diam:     1.90 cm LV SV:         62 LV SV Index:   34 LVOT Area:     2.84 cm  RIGHT VENTRICLE RV Basal diam:  3.20 cm RV S prime:     12.80 cm/s TAPSE (M-mode): 2.9 cm LEFT ATRIUM             Index        RIGHT ATRIUM           Index LA diam:        3.40 cm 1.90 cm/m   RA Area:     14.80 cm LA Vol (A2C):   58.2 ml 32.45 ml/m  RA Volume:   32.40 ml  18.06 ml/m LA Vol (A4C):   72.3 ml 40.31 ml/m LA Biplane Vol: 68.4 ml 38.13 ml/m  AORTIC VALVE AV Area (Vmax):    2.34 cm AV Area (Vmean):   2.15 cm AV Area (VTI):     2.30 cm AV Vmax:           133.00 cm/s AV Vmean:          94.100 cm/s AV VTI:            0.267 m AV Peak Grad:      7.1 mmHg AV Mean Grad:      4.0 mmHg LVOT Vmax:         110.00 cm/s LVOT Vmean:        71.300 cm/s LVOT VTI:          0.217 m LVOT/AV VTI ratio: 0.81  AORTA Ao Root diam: 3.10 cm  SHUNTS Systemic VTI:  0.22 m Systemic Diam: 1.90 cm Kirk Ruths MD Electronically signed by Kirk Ruths MD Signature Date/Time: 07/05/2022/2:35:57 PM    Final    CT CHEST WO CONTRAST  Result Date: 07/05/2022 CLINICAL DATA:  Respiratory illness, nondiagnostic xray abnormal CXR EXAM: CT CHEST WITHOUT CONTRAST TECHNIQUE: Multidetector CT  imaging of the chest was performed following the standard protocol without IV contrast. RADIATION DOSE REDUCTION: This exam was performed according to the departmental dose-optimization program which includes automated exposure control, adjustment of the mA and/or kV according to patient size and/or use of iterative reconstruction technique. COMPARISON:  July 04, 2022. FINDINGS: Cardiovascular: Heart is normal in size. No pericardial effusion. Three-vessel coronary artery atherosclerotic calcifications. Atherosclerotic calcifications throughout the nonaneurysmal thoracic aorta. Mediastinum/Nodes: Visualized thyroid is unremarkable. No axillary or mediastinal adenopathy. Lungs/Pleura: No pleural effusion or pneumothorax. Favored biapical pleuroparenchymal scarring. There  is peripheral predominant subpleural reticulation, mildly more pronounced in the upper lobes. There are some scattered peripheral centrilobular ground-glass opacities noted in the LEFT upper lobe. There are multiple scattered pulmonary nodules with representative nodules as follows: Nodule 1: RIGHT upper lobe pulmonary nodule measures 4 mm (series 5, image 42). Nodule 2: LEFT upper lobe nodule versus scar measures 10 x 4 mm (series 5, image 25) Nodule 3: RIGHT lower lobe pulmonary nodule measures 4 mm (series 5, image 104). Nodule 4: RIGHT upper lobe nodule versus scar measures 8 by 4 mm (series 5, image 25). Upper Abdomen: No acute abnormality. Musculoskeletal: Status post ACDF. IMPRESSION: 1. There is peripheral predominant subpleural reticulation, mildly more pronounced in the upper lobes. There are some scattered peripheral centrilobular ground-glass opacities noted in the LEFT upper lobe. Findings are favored to reflect underlying interstitial lung disease with Solon Alban superimposed infectious or inflammatory process. Recommend further dedicated outpatient evaluation with chest CT ILD protocol after resolution of acute symptomatology. 2. There are  multiple scattered pulmonary nodules. Most significant: 7 mm left solid pulmonary nodule within the upper lobe. Per Fleischner Society Guidelines, recommend Adylin Hankey non-contrast Chest CT at 3-6 months, then consider another non-contrast Chest CT at 18-24 months. If patient is low risk for malignancy, non-contrast Chest CT at 18-24 months is optional. These guidelines do not apply to immunocompromised patients and patients with cancer. Follow up in patients with significant comorbidities as clinically warranted. For lung cancer screening, adhere to Lung-RADS guidelines. Reference: Radiology. 2017; 284(1):228-43. Aortic Atherosclerosis (ICD10-I70.0). Electronically Signed   By: Valentino Saxon M.D.   On: 07/05/2022 12:26   DG Chest 2 View  Result Date: 07/04/2022 CLINICAL DATA:  Shortness of breath, AFib. EXAM: CHEST - 2 VIEW COMPARISON:  Chest radiograph 01/19/2013 FINDINGS: The cardiomediastinal silhouette is normal. The lungs are hyperinflated and hyperlucent suggesting underlying COPD. There is no focal consolidation or pulmonary edema. There is no pleural effusion or pneumothorax. There is small nodular opacity in the lateral right upper lobe. There is no acute osseous abnormality. Cervical spine fusion hardware is noted. IMPRESSION: 1. Hyperinflated and hyperlucent lungs suggesting underlying COPD. No focal consolidation or pleural effusion. 2. Small nodular opacity in the lateral right upper lobe. Recommend nonemergent CT chest for further evaluation. Electronically Signed   By: Valetta Mole M.D.   On: 07/04/2022 16:54    Microbiology: Recent Results (from the past 240 hour(s))  Resp panel by RT-PCR (RSV, Flu Indiyah Paone&B, Covid) Anterior Nasal Swab     Status: Abnormal   Collection Time: 07/04/22  9:58 PM   Specimen: Anterior Nasal Swab  Result Value Ref Range Status   SARS Coronavirus 2 by RT PCR POSITIVE (Keavon Sensing) NEGATIVE Final    Comment: (NOTE) SARS-CoV-2 target nucleic acids are DETECTED.  The SARS-CoV-2  RNA is generally detectable in upper respiratory specimens during the acute phase of infection. Positive results are indicative of the presence of the identified virus, but do not rule out bacterial infection or co-infection with other pathogens not detected by the test. Clinical correlation with patient history and other diagnostic information is necessary to determine patient infection status. The expected result is Negative.  Fact Sheet for Patients: EntrepreneurPulse.com.au  Fact Sheet for Healthcare Providers: IncredibleEmployment.be  This test is not yet approved or cleared by the [place] FDA and  has been authorized for detection and/or diagnosis of SARS-CoV-2 by FDA under an Emergency Use Authorization (EUA).  This EUA will remain in effect (meaning this test can be used) for  the duration of  the COVID-19 declaration under Section 564(b)(1) of the [place] ct, 21 U.S.C. section 360bbb-3(b)(1), unless the authorization is terminated or revoked sooner.     Influenza [place] Ketcher by PCR NEGATIVE NEGATIVE Final   Influenza B by PCR NEGATIVE NEGATIVE Final    Comment: (NOTE) The Xpert Xpress SARS-CoV-2/FLU/RSV plus assay is intended as an aid in the diagnosis of influenza from Nasopharyngeal swab specimens and should not be used as Samit Sylve sole basis for treatment. Nasal washings and aspirates are unacceptable for Xpert Xpress SARS-CoV-2/FLU/RSV testing.  Fact Sheet for Patients: EntrepreneurPulse.com.au  Fact Sheet for Healthcare Providers: IncredibleEmployment.be  This test is not yet approved or cleared by the [place] FDA and has been authorized for detection and/or diagnosis of SARS-CoV-2 by FDA under an Emergency Use Authorization (EUA). This EUA will remain in effect (meaning this test can be used) for the duration of the COVID-19 declaration under Section 564(b)(1) of the Act, 21 U.S.C. section  360bbb-3(b)(1), unless the authorization is terminated or revoked.     Resp Syncytial Virus by PCR NEGATIVE NEGATIVE Final    Comment: (NOTE) Fact Sheet for Patients: EntrepreneurPulse.com.au  Fact Sheet for Healthcare Providers: IncredibleEmployment.be  This test is not yet approved or cleared by the [place] FDA and has been authorized for detection and/or diagnosis of SARS-CoV-2 by FDA under an Emergency Use Authorization (EUA). This EUA will remain in effect (meaning this test can be used) for the duration of the COVID-19 declaration under Section 564(b)(1) of the Act, 21 U.S.C. section 360bbb-3(b)(1), unless the authorization is terminated or revoked.  Performed at [facility], [place] [address], [place], [place] [address]   C Difficile Quick Screen w PCR reflex     Status: None   Collection Time: 07/05/22  2:37 PM   Specimen: Stool  Result Value Ref Range Status   C Diff antigen NEGATIVE NEGATIVE Final   C Diff toxin NEGATIVE NEGATIVE Final   C Diff interpretation No C. difficile detected.  Final    Comment: Performed at [facility], [place] [address], [place], [place] [address]     Labs: Basic Metabolic Panel: Recent Labs  Lab 07/04/22 1604 07/05/22 0435  NA 138 139  K 4.7 4.1  CL 109 107  CO2 22 23  GLUCOSE 109* 97  BUN 21 20  CREATININE 1.36* 1.38*  CALCIUM 9.3 8.8*  MG 2.2  --    Liver Function Tests: No results for input(s): "AST", "ALT", "ALKPHOS", "BILITOT", "PROT", "ALBUMIN" in the last 168 hours. No results for input(s): "LIPASE", "AMYLASE" in the last 168 hours. No results for input(s): "AMMONIA" in the last 168 hours. CBC: Recent Labs  Lab 07/04/22 1604  WBC 7.8  HGB 12.7*  HCT 38.8*  MCV 89.8  PLT 288   Cardiac Enzymes: No results for input(s): "CKTOTAL", "CKMB", "CKMBINDEX", "TROPONINI" in the last 168 hours. BNP: BNP (last 3 results) Recent Labs    07/04/22 1604  BNP 213.2*     ProBNP (last 3 results) No results for input(s): "PROBNP" in the last 8760 hours.  CBG: No results for input(s): "GLUCAP" in the last 168 hours.     Signed:  Fayrene Helper MD.  [facility] 07/05/2022, 7:02 PM

## 2022-07-05 NOTE — Assessment & Plan Note (Signed)
-  Patient reports 2 weeks of diarrhea which could be from [place].  Will also test GI stool panel and C. difficile. -Patient outside of window for any antiviral treatment

## 2022-07-05 NOTE — Progress Notes (Addendum)
Pt medicated with due medications then discharged to home in stable condition, all discharge instructions and medications reviewed with pt.  Pt asks appropriate questions, all questions answered pt gives complete verbal understanding. Pt transported off unit via wheelchair with this RN at chairside to private vehicle for transport home by wife.

## 2022-07-05 NOTE — ED Notes (Signed)
ED TO INPATIENT HANDOFF REPORT  ED Nurse Name and Phone #: Barbie Banner, RN [phone]  S Name/Age/Gender Kent [name] 82 y.o. male Room/Bed: 037C/037C  Code Status   Code Status: Full Code  Home/SNF/Other Home Patient oriented to: self, place, time, and situation Is this baseline? Yes   Triage Complete: Triage complete  Chief Complaint Atrial fibrillation ([place]) [I48.91]  Triage Note Pt bib ems from [facility] physicians; ems reports pt endorses hx afib but PCP says pt does not have hx afib; elevation in v3 and v4 with ems; ems consulted EDP, code stemi not initiated; pt afib/flutter with ems; pt went to PCP today for evaluation of diarrhea x 6 months; pt endorses 35 lb weight loss over past 6 months;  diagnosed with pneumonia last month, recently finished abx; endorses recent cough, swelling to bilateral ankles, and intermittent "chest weakness"; pt given 324 asa with ems; pt endorses allergy to asa ("the doctor says not to take it", but states he takes 81 mg daily; 18 ga lac; 140/80, HR 110, 99% RA; placed on 2L for comfort); cbg 120, RR 18   Allergies No Known Allergies  Level of Care/Admitting Diagnosis ED Disposition     ED Disposition  Admit   Condition  --   [place]: [name] [[id number]]  Level of Care: Telemetry Cardiac [[id number]]  May place patient in observation at [facility] or [facility] if equivalent level of care is available:: No  Covid Evaluation: Asymptomatic - no recent exposure (last 10 days) testing not required  Diagnosis: Atrial fibrillation ([place]) [427.31.ICD-9-CM]  Admitting Physician: Orene Desanctis [[id number]]  Attending Physician: Orene Desanctis [[id number]]          B Medical/Surgery History 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       Cancer ([place])    basal cell cancer - skin   Chronic back pain    s/p back surgery   GERD (gastroesophageal reflux disease) 08/18/2013   Incisional hernia,  without obstruction or gangrene 02/02/2013   Postural dizziness with presyncope 09/01/2016   Sinus infection    currently on antibiotics   Small bowel obstruction due to adhesions [facility])    Past Surgical History:  Procedure Laterality Date   BACK SURGERY  08/25/2001   Neck 3 disk replacements   BACK SURGERY     Placement/removal of nerve stimulator   CHOLECYSTECTOMY N/A 10/16/2014   Procedure: LAPAROSCOPIC CHOLECYSTECTOMY WITH ATTEMPTED CHOLANGIOGRAM;  Surgeon: Georganna Skeans, MD;  Location: [place];  Service: General;  Laterality: N/A;   COLON SURGERY  04/2002   Dr. Deon Pilling - Subtotal colectomy, ileostomy   ELBOW SURGERY Right    for blood vessel & " hung" nerve   ESOPHAGEAL DILATION     EXPLORATORY LAPAROTOMY  03/10/2017   HERNIA REPAIR     Ileostomy Takedown  08/2002   Dr. Deon Pilling   INCISION AND DRAINAGE FOOT     INCISIONAL HERNIA REPAIR N/A 01/20/2013   Procedure: HERNIA REPAIR INCISIONAL;  Surgeon: Zenovia Jarred, MD;  Location: [place];  Service: General;  Laterality: N/A;   [place]  04/13/2014   with mesh    dr Alene Mires HERNIA REPAIR N/A 04/13/2014   Procedure: OPEN REPAIR INCISIONAL HERNIA ;  Surgeon: Georganna Skeans, MD;  Location: [place];  Service: General;  Laterality: N/A;   INSERTION OF MESH N/A 04/13/2014   Procedure: INSERTION OF MESH;  Surgeon: Georganna Skeans, MD;  Location: [place];  Service: General;  Laterality: N/A;   KNEE ARTHROSCOPY Right 2010   LAPAROSCOPIC CHOLECYSTECTOMY  10/16/2014   LAPAROTOMY N/A 01/20/2013   Procedure: EXPLORATORY LAPAROTOMY;  Surgeon: Zenovia Jarred, MD;  Location: [place];  Service: General;  Laterality: N/A;   LAPAROTOMY N/A 03/10/2017   Procedure: EXPLORATORY LAPAROTOMY;  Surgeon: Georganna Skeans, MD;  Location: [place];  Service: General;  Laterality: N/A;   Laparotomy with lysis of adhesions  2010   Dr. [place] Silos - [facility]   LYSIS OF ADHESION N/A 01/20/2013   Procedure: LYSIS OF ADHESION;  Surgeon: Zenovia Jarred, MD;   Location: [place];  Service: General;  Laterality: N/A;   LYSIS OF ADHESION N/A 03/10/2017   Procedure: LYSIS OF ADHESION;  Surgeon: Georganna Skeans, MD;  Location: [place];  Service: General;  Laterality: N/A;   microsurgical  03/24/2002   Lateral recess decompression L5   SACRAL NERVE STIMULATOR PLACEMENT     inserted and removed   SHOULDER ARTHROSCOPY Bilateral    VASECTOMY       A IV Location/Drains/Wounds Patient Lines/Drains/Airways Status     Active Line/Drains/Airways     Name Placement date Placement time Site Days   Peripheral IV 07/04/22 18 G Left Antecubital 07/04/22  2100  Antecubital  1   Incision (Closed) 10/16/14 Abdomen Other (Comment) 10/16/14  0722  -- 2819   Incision (Closed) 03/10/17 Abdomen Other (Comment) 03/10/17  0933  -- 1943   Incision - 4 Ports Abdomen 1: Umbilicus 2: Upper;Mid 3: Right;Medial 4: Right;Lateral 10/16/14  --  -- 2819            Intake/Output Last 24 hours  Intake/Output Summary (Last 24 hours) at 07/05/2022 1403 Last data filed at 07/05/2022 0128 Gross per 24 hour  Intake 500 ml  Output --  Net 500 ml    Labs/Imaging Results for orders placed or performed during the hospital encounter of 07/04/22 (from the past 48 hour(s))  Basic metabolic panel     Status: Abnormal   Collection Time: 07/04/22  4:04 PM  Result Value Ref Range   Sodium 138 135 - 145 mmol/L   Potassium 4.7 3.5 - 5.1 mmol/L   Chloride 109 98 - 111 mmol/L   CO2 22 22 - 32 mmol/L   Glucose, Bld 109 (H) 70 - 99 mg/dL    Comment: Glucose reference range applies only to samples taken after fasting for at least 8 hours.   BUN 21 8 - 23 mg/dL   Creatinine, Ser 1.36 (H) 0.61 - 1.24 mg/dL   Calcium 9.3 8.9 - 10.3 mg/dL   GFR, Estimated 53 (L) >60 mL/min    Comment: (NOTE) Calculated using the CKD-EPI Creatinine Equation (2021)    Anion gap 7 5 - 15    Comment: Performed at [facility] [address], [place], [place] [address]  CBC     Status: Abnormal    Collection Time: 07/04/22  4:04 PM  Result Value Ref Range   WBC 7.8 4.0 - 10.5 K/uL   RBC 4.32 4.22 - 5.81 MIL/uL   Hemoglobin 12.7 (L) 13.0 - 17.0 g/dL   HCT 38.8 (L) 39.0 - 52.0 %   MCV 89.8 80.0 - 100.0 fL   MCH 29.4 26.0 - 34.0 pg   MCHC 32.7 30.0 - 36.0 g/dL   RDW 13.9 11.5 - 15.5 %   Platelets 288 150 - 400 K/uL   nRBC 0.0 0.0 -  0.2 %    Comment: Performed at [facility], [place] [address] [place], [place] [address]  Troponin I (High Sensitivity)     Status: None   Collection Time: 07/04/22  4:04 PM  Result Value Ref Range   Troponin I (High Sensitivity) 7 <18 ng/L    Comment: (NOTE) Elevated high sensitivity troponin I (hsTnI) values and significant  changes across serial measurements may suggest ACS but many other  chronic and acute conditions are known to elevate hsTnI results.  Refer to the "Links" section for chest pain algorithms and additional  guidance. Performed at [facility], [place] [address] [place], [place] [address]   Brain natriuretic peptide     Status: Abnormal   Collection Time: 07/04/22  4:04 PM  Result Value Ref Range   B Natriuretic Peptide 213.2 (H) 0.0 - 100.0 pg/mL    Comment: Performed at [facility] [address] [place], [place] [address]  Magnesium     Status: None   Collection Time: 07/04/22  4:04 PM  Result Value Ref Range   Magnesium 2.2 1.7 - 2.4 mg/dL    Comment: Performed at [facility] [address] [place], [place] [address]  Troponin I (High Sensitivity)     Status: None   Collection Time: 07/04/22  6:18 PM  Result Value Ref Range   Troponin I (High Sensitivity) 10 <18 ng/L    Comment: (NOTE) Elevated high sensitivity troponin I (hsTnI) values and significant  changes across serial measurements may suggest ACS but many other  chronic and acute conditions are known to elevate hsTnI results.  Refer to the "Links" section for chest pain algorithms and additional  guidance. Performed at [facility], [place] [address], [place], [place] [address]   Resp panel by RT-PCR (RSV, Flu A&B, Covid) Anterior Nasal Swab     Status: Abnormal   Collection Time: 07/04/22  9:58 PM   Specimen: Anterior Nasal Swab  Result Value Ref Range   SARS Coronavirus 2 by RT PCR POSITIVE (A) NEGATIVE    Comment: (NOTE) SARS-CoV-2 target nucleic acids are DETECTED.  The SARS-CoV-2 RNA is generally detectable in upper respiratory specimens during the acute phase of infection. Positive results are indicative of the presence of the identified virus, but do not rule out bacterial infection or co-infection with other pathogens not detected by the test. Clinical correlation with patient history and other diagnostic information is necessary to determine patient infection status. The expected result is Negative.  Fact Sheet for Patients: EntrepreneurPulse.com.au  Fact Sheet for Healthcare Providers: IncredibleEmployment.be  This test is not yet approved or cleared by the [place] FDA and  has been authorized for detection and/or diagnosis of SARS-CoV-2 by FDA under an Emergency Use Authorization (EUA).  This EUA will remain in effect (meaning this test can be used) for the duration of  the COVID-19 declaration under Section 564(b)(1) of the A ct, 21 U.S.C. section 360bbb-3(b)(1), unless the authorization is terminated or revoked sooner.     Influenza A by PCR NEGATIVE NEGATIVE   Influenza B by PCR NEGATIVE NEGATIVE    Comment: (NOTE) The Xpert Xpress SARS-CoV-2/FLU/RSV plus assay is intended as an aid in the diagnosis of influenza from Nasopharyngeal swab specimens and should not be used as a sole basis for treatment. Nasal washings and aspirates are unacceptable for Xpert Xpress SARS-CoV-2/FLU/RSV testing.  Fact Sheet for Patients: EntrepreneurPulse.com.au  Fact Sheet for Healthcare  Providers:  IncredibleEmployment.be  This test is not yet approved or cleared by the [place] and has been authorized for detection and/or diagnosis of SARS-CoV-2 by FDA under an Emergency Use Authorization (EUA). This EUA will remain in effect (meaning this test can be used) for the duration of the COVID-19 declaration under Section 564(b)(1) of the Act, 21 U.S.C. section 360bbb-3(b)(1), unless the authorization is terminated or revoked.     Resp Syncytial Virus by PCR NEGATIVE NEGATIVE    Comment: (NOTE) Fact Sheet for Patients: EntrepreneurPulse.com.au  Fact Sheet for Healthcare Providers: IncredibleEmployment.be  This test is not yet approved or cleared by the [place] FDA and has been authorized for detection and/or diagnosis of SARS-CoV-2 by FDA under an Emergency Use Authorization (EUA). This EUA will remain in effect (meaning this test can be used) for the duration of the COVID-19 declaration under Section 564(b)(1) of the Act, 21 U.S.C. section 360bbb-3(b)(1), unless the authorization is terminated or revoked.  Performed at [facility], [place] [address], [place], [place] [address]   Urinalysis, Routine w reflex microscopic     Status: Abnormal   Collection Time: 07/04/22 11:38 PM  Result Value Ref Range   Color, Urine YELLOW YELLOW   APPearance HAZY (A) CLEAR   Specific Gravity, Urine 1.019 1.005 - 1.030   pH 5.0 5.0 - 8.0   Glucose, UA NEGATIVE NEGATIVE mg/dL   Hgb urine dipstick NEGATIVE NEGATIVE   Bilirubin Urine NEGATIVE NEGATIVE   Ketones, ur NEGATIVE NEGATIVE mg/dL   Protein, ur NEGATIVE NEGATIVE mg/dL   Nitrite NEGATIVE NEGATIVE   Leukocytes,Ua NEGATIVE NEGATIVE   RBC / HPF 0-5 0 - 5 RBC/hpf   WBC, UA 0-5 0 - 5 WBC/hpf   Bacteria, UA NONE SEEN NONE SEEN   Squamous Epithelial / HPF 0-5 0 - 5 /HPF   Mucus PRESENT    Hyaline Casts, UA PRESENT     Comment: Performed at [facility] [address] [place], [place] [address]  Basic metabolic panel     Status: Abnormal   Collection Time: 07/05/22  4:35 AM  Result Value Ref Range   Sodium 139 135 - 145 mmol/L   Potassium 4.1 3.5 - 5.1 mmol/L   Chloride 107 98 - 111 mmol/L   CO2 23 22 - 32 mmol/L   Glucose, Bld 97 70 - 99 mg/dL    Comment: Glucose reference range applies only to samples taken after fasting for at least 8 hours.   BUN 20 8 - 23 mg/dL   Creatinine, Ser 1.38 (H) 0.61 - 1.24 mg/dL   Calcium 8.8 (L) 8.9 - 10.3 mg/dL   GFR, Estimated 52 (L) >60 mL/min    Comment: (NOTE) Calculated using the CKD-EPI Creatinine Equation (2021)    Anion gap 9 5 - 15    Comment: Performed at [facility] [address], [place], [place] [address]  TSH     Status: None   Collection Time: 07/05/22  4:35 AM  Result Value Ref Range   TSH 1.011 0.350 - 4.500 uIU/mL    Comment: Performed by a 3rd Generation assay with a functional sensitivity of <=0.01 uIU/mL. Performed at [facility], [place] [address] [place], [place] [address]    CT CHEST WO CONTRAST  Result Date: 07/05/2022 CLINICAL DATA:  Respiratory illness, nondiagnostic xray abnormal CXR EXAM: CT CHEST WITHOUT CONTRAST TECHNIQUE: Multidetector CT imaging of the chest was performed following the standard protocol without IV contrast.  RADIATION DOSE REDUCTION: This exam was performed according to the departmental dose-optimization program which includes automated exposure control, adjustment of the mA and/or kV according to patient size and/or use of iterative reconstruction technique. COMPARISON:  July 04, 2022. FINDINGS: Cardiovascular: Heart is normal in size. No pericardial effusion. Three-vessel coronary artery atherosclerotic calcifications. Atherosclerotic calcifications throughout the nonaneurysmal thoracic aorta. Mediastinum/Nodes: Visualized thyroid is unremarkable. No axillary or mediastinal adenopathy. Lungs/Pleura: No pleural effusion or  pneumothorax. Favored biapical pleuroparenchymal scarring. There is peripheral predominant subpleural reticulation, mildly more pronounced in the upper lobes. There are some scattered peripheral centrilobular ground-glass opacities noted in the LEFT upper lobe. There are multiple scattered pulmonary nodules with representative nodules as follows: Nodule 1: RIGHT upper lobe pulmonary nodule measures 4 mm (series 5, image 42). Nodule 2: LEFT upper lobe nodule versus scar measures 10 x 4 mm (series 5, image 25) Nodule 3: RIGHT lower lobe pulmonary nodule measures 4 mm (series 5, image 104). Nodule 4: RIGHT upper lobe nodule versus scar measures 8 by 4 mm (series 5, image 25). Upper Abdomen: No acute abnormality. Musculoskeletal: Status post ACDF. IMPRESSION: 1. There is peripheral predominant subpleural reticulation, mildly more pronounced in the upper lobes. There are some scattered peripheral centrilobular ground-glass opacities noted in the LEFT upper lobe. Findings are favored to reflect underlying interstitial lung disease with a superimposed infectious or inflammatory process. Recommend further dedicated outpatient evaluation with chest CT ILD protocol after resolution of acute symptomatology. 2. There are multiple scattered pulmonary nodules. Most significant: 7 mm left solid pulmonary nodule within the upper lobe. Per Fleischner Society Guidelines, recommend a non-contrast Chest CT at 3-6 months, then consider another non-contrast Chest CT at 18-24 months. If patient is low risk for malignancy, non-contrast Chest CT at 18-24 months is optional. These guidelines do not apply to immunocompromised patients and patients with cancer. Follow up in patients with significant comorbidities as clinically warranted. For lung cancer screening, adhere to Lung-RADS guidelines. Reference: Radiology. 2017; 284(1):228-43. Aortic Atherosclerosis (ICD10-I70.0). Electronically Signed   By: Valentino Saxon M.D.   On: 07/05/2022  12:26   DG Chest 2 View  Result Date: 07/04/2022 CLINICAL DATA:  Shortness of breath, AFib. EXAM: CHEST - 2 VIEW COMPARISON:  Chest radiograph 01/19/2013 FINDINGS: The cardiomediastinal silhouette is normal. The lungs are hyperinflated and hyperlucent suggesting underlying COPD. There is no focal consolidation or pulmonary edema. There is no pleural effusion or pneumothorax. There is small nodular opacity in the lateral right upper lobe. There is no acute osseous abnormality. Cervical spine fusion hardware is noted. IMPRESSION: 1. Hyperinflated and hyperlucent lungs suggesting underlying COPD. No focal consolidation or pleural effusion. 2. Small nodular opacity in the lateral right upper lobe. Recommend nonemergent CT chest for further evaluation. Electronically Signed   By: Valetta Mole M.D.   On: 07/04/2022 16:54    Pending Labs Unresulted Labs (From admission, onward)     Start     Ordered   07/06/22 7989  Basic metabolic panel  Tomorrow morning,   R        07/05/22 0942   07/06/22 0500  CBC  Tomorrow morning,   R        07/05/22 0942   07/06/22 0500  Magnesium  Tomorrow morning,   R        07/05/22 0942   07/06/22 0500  Phosphorus  Tomorrow morning,   R        07/05/22 0942   07/05/22 0008  Gastrointestinal Panel by PCR ,  Stool  (Gastrointestinal Panel by PCR, Stool                                                                                                                                                     **Does Not include CLOSTRIDIUM DIFFICILE testing. **If CDIFF testing is needed, place order from the "C Difficile Testing" order set.**)  Once,   R        07/05/22 0007   07/05/22 0008  C Difficile Quick Screen w PCR reflex  (C Difficile quick screen w PCR reflex panel )  Once, for 24 hours,   TIMED       References:    CDiff Information Tool   07/05/22 0007            Vitals/Pain Today's Vitals   07/05/22 1030 07/05/22 1045 07/05/22 1100 07/05/22 1211  BP: 105/64 102/77  106/70 108/78  Pulse: 78 71 70 69  Resp: (!) 25 (!) 22 (!) 21 17  Temp:   98 F (36.7 C)   TempSrc:      SpO2: 99% 99% 100% 100%  Weight:      Height:      PainSc:        Isolation Precautions Enteric precautions (UV disinfection)  Medications Medications  apixaban (ELIQUIS) tablet 5 mg (5 mg Oral Given 07/05/22 0916)  metoprolol tartrate (LOPRESSOR) tablet 25 mg (has no administration in time range)  metoprolol tartrate (LOPRESSOR) injection 5 mg (5 mg Intravenous Given 07/04/22 2205)  lactated ringers bolus 500 mL (0 mLs Intravenous Stopped 07/05/22 0128)    Mobility walks Low fall risk   Focused Assessments Cardiac Assessment Handoff:  Cardiac Rhythm: Atrial fibrillation Lab Results  Component Value Date   TROPONINI <0.03 09/02/2016   No results found for: "DDIMER" Does the Patient currently have chest pain? No    R Recommendations: See Admitting Provider Note  Report given to:

## 2022-07-23 ENCOUNTER — Ambulatory Visit (HOSPITAL_COMMUNITY)
Admission: RE | Admit: 2022-07-23 | Discharge: 2022-07-23 | Disposition: A | Discharge status: Home / Self Care | Payer: Medicare Other | Source: Ambulatory Visit | Attending: Nurse Practitioner | Admitting: Nurse Practitioner

## 2022-07-23 ENCOUNTER — Encounter (HOSPITAL_COMMUNITY): Payer: Self-pay | Admitting: Nurse Practitioner

## 2022-07-23 VITALS — BP 122/50 | HR 54 | Ht 70.0 in | Wt 150.8 lb

## 2022-07-23 DIAGNOSIS — Z8249 Family history of ischemic heart disease and other diseases of the circulatory system: Secondary | ICD-10-CM | POA: Diagnosis not present

## 2022-07-23 DIAGNOSIS — Z87891 Personal history of nicotine dependence: Secondary | ICD-10-CM | POA: Diagnosis not present

## 2022-07-23 DIAGNOSIS — R001 Bradycardia, unspecified: Secondary | ICD-10-CM | POA: Insufficient documentation

## 2022-07-23 DIAGNOSIS — D6869 Other thrombophilia: Secondary | ICD-10-CM

## 2022-07-23 DIAGNOSIS — Z79899 Other long term (current) drug therapy: Secondary | ICD-10-CM | POA: Insufficient documentation

## 2022-07-23 DIAGNOSIS — Z7901 Long term (current) use of anticoagulants: Secondary | ICD-10-CM | POA: Insufficient documentation

## 2022-07-23 DIAGNOSIS — I480 Paroxysmal atrial fibrillation: Secondary | ICD-10-CM | POA: Diagnosis not present

## 2022-07-23 NOTE — Progress Notes (Signed)
Primary Care Physician: Shirline Frees, MD Referring Physician: Dr. Florene Glen, f/u admission    Damien Fusi Cafaro is a 82 y.o. male with a h/o paroxysmal atrial fibrillation not on anticoagulation, subtotal right colectomy, SBO, GERD who presents from primary care office with  increasing shortness of breath and found to be in  afib. He was rate controlled with preserved EF by echo. He was felt to have had an URI in the last couple of weeks. Placed on metoprolol and eliquis. Afib was first noted in 2020 with transient afib but lost to cardiology. CHA2DS2VASc score of 2.   He has only been taking eliquis once a day as he fears this will be very expensive for him but has not checked with his insurance plan. He was basing  the cost on a site like [facility] that does not pay for brand drugs, he is also interested in the [place] clinical trial where he could get drug free.    He is in SR today and feels well. He will need to f/u with PCP for abnormal lung CT showing nodules and possible interstitial  lung disease.  Today, he denies symptoms of palpitations, chest pain, shortness of breath, orthopnea, PND, lower extremity edema, dizziness, presyncope, syncope, or neurologic sequela. The patient is tolerating medications without difficulties and is otherwise without complaint today.   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       Cancer ([place])    basal cell cancer - skin   Chronic back pain    s/p back surgery   GERD (gastroesophageal reflux disease) 08/18/2013   Incisional hernia, without obstruction or gangrene 02/02/2013   Postural dizziness with presyncope 09/01/2016   Sinus infection    currently on antibiotics   Small bowel obstruction due to adhesions [facility])    Past Surgical History:  Procedure Laterality Date   BACK SURGERY  08/25/2001   Neck 3 disk replacements   BACK SURGERY     Placement/removal of nerve stimulator   CHOLECYSTECTOMY N/A 10/16/2014   Procedure:  LAPAROSCOPIC CHOLECYSTECTOMY WITH ATTEMPTED CHOLANGIOGRAM;  Surgeon: Georganna Skeans, MD;  Location: [place];  Service: General;  Laterality: N/A;   COLON SURGERY  04/2002   Dr. Deon Pilling - Subtotal colectomy, ileostomy   ELBOW SURGERY Right    for blood vessel & " hung" nerve   ESOPHAGEAL DILATION     EXPLORATORY LAPAROTOMY  03/10/2017   HERNIA REPAIR     Ileostomy Takedown  08/2002   Dr. Deon Pilling   INCISION AND DRAINAGE FOOT     INCISIONAL HERNIA REPAIR N/A 01/20/2013   Procedure: HERNIA REPAIR INCISIONAL;  Surgeon: Zenovia Jarred, MD;  Location: [place];  Service: General;  Laterality: N/A;   [place]  04/13/2014   with mesh    dr Alene Mires HERNIA REPAIR N/A 04/13/2014   Procedure: OPEN REPAIR INCISIONAL HERNIA ;  Surgeon: Georganna Skeans, MD;  Location: [place];  Service: General;  Laterality: N/A;   INSERTION OF MESH N/A 04/13/2014   Procedure: INSERTION OF MESH;  Surgeon: Georganna Skeans, MD;  Location: [place];  Service: General;  Laterality: N/A;   KNEE ARTHROSCOPY Right 2010   LAPAROSCOPIC CHOLECYSTECTOMY  10/16/2014   LAPAROTOMY N/A 01/20/2013   Procedure: EXPLORATORY LAPAROTOMY;  Surgeon: Zenovia Jarred, MD;  Location: [place];  Service: General;  Laterality: N/A;   LAPAROTOMY N/A 03/10/2017   Procedure: EXPLORATORY LAPAROTOMY;  Surgeon: Georganna Skeans,  MD;  Location: [place];  Service: General;  Laterality: N/A;   Laparotomy with lysis of adhesions  2010   Dr. [place] Silos - SBO   LYSIS OF ADHESION N/A 01/20/2013   Procedure: LYSIS OF ADHESION;  Surgeon: Zenovia Jarred, MD;  Location: [place];  Service: General;  Laterality: N/A;   LYSIS OF ADHESION N/A 03/10/2017   Procedure: LYSIS OF ADHESION;  Surgeon: Georganna Skeans, MD;  Location: [place];  Service: General;  Laterality: N/A;   microsurgical  03/24/2002   Lateral recess decompression L5   SACRAL NERVE STIMULATOR PLACEMENT     inserted and removed   SHOULDER ARTHROSCOPY Bilateral    VASECTOMY      Current  Outpatient Medications  Medication Sig Dispense Refill   apixaban (ELIQUIS) 5 MG TABS tablet Take 1 tablet (5 mg total) by mouth 2 (two) times daily. 60 tablet 1   fluticasone (FLONASE) 50 MCG/ACT nasal spray Place 1 spray into both nostrils at bedtime.     loratadine-pseudoephedrine (CLARITIN-D 24 HOUR) 10-240 MG 24 hr tablet Take 1 tablet by mouth as needed.     metoprolol tartrate (LOPRESSOR) 25 MG tablet Take 1 tablet (25 mg total) by mouth 2 (two) times daily. 60 tablet 1   traMADol (ULTRAM) 50 MG tablet Take 50 mg by mouth every 8 (eight) hours as needed.     morphine (MS CONTIN) 30 MG 12 hr tablet Take 30 mg by mouth every 12 (twelve) hours. (Patient not taking: Reported on 07/23/2022)  0   No current facility-administered medications for this encounter.    No Known Allergies  Social History   Socioeconomic History   Marital status: Married    Spouse name: Not on file   Number of children: Not on file   Years of education: Not on file   Highest education level: Not on file  Occupational History   Not on file  Tobacco Use   Smoking status: Former   Smokeless tobacco: Former    Types: Chew    Quit date: 03/05/1986  Vaping Use   Vaping Use: Never used  Substance and Sexual Activity   Alcohol use: No   Drug use: No   Sexual activity: Not on file  Other Topics Concern   Not on file  Social History Narrative   Married for 98 years, 3 granddaughters   Social Determinants of Health   Financial Resource Strain: Not on file  Food Insecurity: No Food Insecurity (07/05/2022)   Hunger Vital Sign    Worried About Running Out of Food in the Last Year: Never true    [place] in the Last Year: Never true  Transportation Needs: No Transportation Needs (07/05/2022)   PRAPARE - Hydrologist (Medical): No    Lack of Transportation (Non-Medical): No  Physical Activity: Not on file  Stress: Not on file  Social Connections: Not on file  Intimate  Partner Violence: Not At Risk (07/05/2022)   Humiliation, Afraid, Rape, and Kick questionnaire    Fear of Current or Ex-Partner: No    Emotionally Abused: No    Physically Abused: No    Sexually Abused: No    Family History  Problem Relation Age of Onset   Heart disease Mother    Brain cancer Father     ROS- All systems are reviewed and negative except as per the HPI above  Physical Exam: Vitals:   07/23/22 1418  Weight: 68.4 kg  Height:  $'5\' 10"'R$  (1.778 m)   Wt Readings from Last 3 Encounters:  07/23/22 68.4 kg  07/04/22 63.5 kg  02/26/19 69.9 kg    Labs: Lab Results  Component Value Date   NA 139 07/05/2022   K 4.1 07/05/2022   CL 107 07/05/2022   CO2 23 07/05/2022   GLUCOSE 97 07/05/2022   BUN 20 07/05/2022   CREATININE 1.38 (H) 07/05/2022   CALCIUM 8.8 (L) 07/05/2022   PHOS 3.2 09/02/2016   MG 2.2 07/04/2022   Lab Results  Component Value Date   INR 0.94 01/19/2013   No results found for: "CHOL", "HDL", "LDLCALC", "TRIG"   GEN- The patient is well appearing, alert and oriented x 3 today.   Head- normocephalic, atraumatic Eyes-  Sclera clear, conjunctiva pink Ears- hearing intact Oropharynx- clear Neck- supple, no JVP Lymph- no cervical lymphadenopathy Lungs- Clear to ausculation bilaterally, normal work of breathing Heart- Regular rate and rhythm, no murmurs, rubs or gallops, PMI not laterally displaced GI- soft, NT, ND, + BS Extremities- no clubbing, cyanosis, or edema MS- no significant deformity or atrophy Skin- no rash or lesion Psych- euthymic mood, full affect Neuro- strength and sensation are intact  EKG-Vent. rate 54 BPM PR interval 200 ms QRS duration 94 ms QT/QTcB 428/405 ms P-R-T axes 83 -33 61 Sinus bradycardia Left axis deviation ST elevation, consider early repolarization Abnormal ECG When compared with ECG of 04-Jul-2022 16:03, PREVIOUS ECG IS PRESENT  Echo-1. Left ventricular ejection fraction, by estimation, is 55 to 60%.  The  left ventricle has normal function. The left ventricle has no regional  wall motion abnormalities. Left ventricular diastolic function could not  be evaluated.   2. Right ventricular systolic function is normal. The right ventricular  size is normal.   3. Left atrial size was mild to moderately dilated.   4. The mitral valve is normal in structure. Trivial mitral valve  regurgitation. No evidence of mitral stenosis.   5. The aortic valve is tricuspid. Aortic valve regurgitation is not  visualized. Aortic valve sclerosis is present, with no evidence of aortic  valve stenosis.   6. The inferior vena cava is normal in size with greater than 50%  respiratory variability, suggesting right atrial pressure of 3 mmHg.    Assessment and Plan:  1. Paroxsymal afib He is in rhythm today  General education afib and triggers discussed He was placed on metoprolol 12.5 mg bid and will continue this   2. CHA2DS2VASc score of 2 Continue eliquis 5 mg bid  He is concerned re cost I will refer to [place] clinical trial  so he may be able to get drug free  F/u here in 10 days   Butch Penny C. Tanga [place], [facility] [address] [place], [place] [address] [phone]

## 2022-07-23 NOTE — Patient Instructions (Signed)
Eliquis '5mg'$  TWICE a day  Call your drug insurance regarding costs of Eliquis

## 2022-07-25 ENCOUNTER — Other Ambulatory Visit (HOSPITAL_COMMUNITY): Payer: Self-pay

## 2022-07-25 MED ORDER — APIXABAN 5 MG PO TABS: 5.0000 mg | ORAL_TABLET | Freq: Two times a day (BID) | ORAL | 0 refills | Status: DC

## 2022-07-31 ENCOUNTER — Encounter (HOSPITAL_COMMUNITY): Payer: Self-pay | Admitting: *Deleted

## 2022-08-14 ENCOUNTER — Other Ambulatory Visit: Payer: Self-pay | Admitting: Gastroenterology

## 2022-08-14 DIAGNOSIS — R109 Unspecified abdominal pain: Secondary | ICD-10-CM

## 2022-08-14 DIAGNOSIS — R634 Abnormal weight loss: Secondary | ICD-10-CM

## 2022-08-19 ENCOUNTER — Ambulatory Visit
Admission: RE | Admit: 2022-08-19 | Discharge: 2022-08-19 | Disposition: A | Discharge status: Home / Self Care | Payer: Medicare Other | Source: Ambulatory Visit | Attending: Gastroenterology | Admitting: Gastroenterology

## 2022-08-19 DIAGNOSIS — R109 Unspecified abdominal pain: Secondary | ICD-10-CM

## 2022-08-19 DIAGNOSIS — R634 Abnormal weight loss: Secondary | ICD-10-CM

## 2022-08-21 ENCOUNTER — Ambulatory Visit (HOSPITAL_COMMUNITY): Payer: Medicare Other | Admitting: Nurse Practitioner

## 2022-08-26 ENCOUNTER — Ambulatory Visit (HOSPITAL_COMMUNITY)
Admission: RE | Admit: 2022-08-26 | Discharge: 2022-08-26 | Disposition: A | Discharge status: Home / Self Care | Payer: Medicare Other | Source: Ambulatory Visit | Attending: Physician Assistant | Admitting: Physician Assistant

## 2022-08-26 VITALS — BP 106/50 | HR 57 | Ht 70.0 in | Wt 157.6 lb

## 2022-08-26 DIAGNOSIS — Z7901 Long term (current) use of anticoagulants: Secondary | ICD-10-CM | POA: Diagnosis not present

## 2022-08-26 DIAGNOSIS — I480 Paroxysmal atrial fibrillation: Secondary | ICD-10-CM | POA: Insufficient documentation

## 2022-08-26 DIAGNOSIS — Z79899 Other long term (current) drug therapy: Secondary | ICD-10-CM | POA: Diagnosis not present

## 2022-08-26 DIAGNOSIS — D6869 Other thrombophilia: Secondary | ICD-10-CM | POA: Insufficient documentation

## 2022-08-26 DIAGNOSIS — Z8616 Personal history of COVID-19: Secondary | ICD-10-CM | POA: Insufficient documentation

## 2022-08-26 MED ORDER — METOPROLOL TARTRATE 25 MG PO TABS: 25.0000 mg | ORAL_TABLET | Freq: Two times a day (BID) | ORAL | 6 refills | Status: DC

## 2022-08-26 MED ORDER — APIXABAN 5 MG PO TABS: 5.0000 mg | ORAL_TABLET | Freq: Two times a day (BID) | ORAL | 6 refills | Status: DC

## 2022-08-26 NOTE — Progress Notes (Addendum)
Primary Care Physician: Shirline Frees, MD Referring Physician: Dr. Florene Glen, f/u admission    Kent [name] is a 82 y.o. male with a h/o paroxysmal atrial fibrillation, subtotal right colectomy, SBO, GERD who presented to his primary care office with  increasing shortness of breath and found to be in  afib. He was rate controlled with preserved EF by echo. Patient noted to have transient atrial fibrillation in 2020 following SBO but lost to follow-up with cardiology. Placed on metoprolol and Eliquis for CHA2DS2VASc score of 2. Of note, he was also positive for COVID at that time.   He did have an abnormal lung CT showing nodules and possible interstitial lung disease.  On follow up today, patient reports that he has done well since his last visit. He denies any tachypalpitations. No bleeding issues on anticoagulation. He is in SR today.   Today, he denies symptoms of palpitations, chest pain, shortness of breath, orthopnea, PND, lower extremity edema, dizziness, presyncope, syncope, or neurologic sequela. The patient is tolerating medications without difficulties and is otherwise without complaint today.   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       Cancer ([place])    basal cell cancer - skin   Chronic back pain    s/p back surgery   GERD (gastroesophageal reflux disease) 08/18/2013   Incisional hernia, without obstruction or gangrene 02/02/2013   Postural dizziness with presyncope 09/01/2016   Sinus infection    currently on antibiotics   Small bowel obstruction due to adhesions [facility])    Past Surgical History:  Procedure Laterality Date   BACK SURGERY  08/25/2001   Neck 3 disk replacements   BACK SURGERY     Placement/removal of nerve stimulator   CHOLECYSTECTOMY N/A 10/16/2014   Procedure: LAPAROSCOPIC CHOLECYSTECTOMY WITH ATTEMPTED CHOLANGIOGRAM;  Surgeon: Georganna Skeans, MD;  Location: [place];  Service: General;  Laterality: N/A;   COLON SURGERY   04/2002   Dr. Deon Pilling - Subtotal colectomy, ileostomy   ELBOW SURGERY Right    for blood vessel & " hung" nerve   ESOPHAGEAL DILATION     EXPLORATORY LAPAROTOMY  03/10/2017   HERNIA REPAIR     Ileostomy Takedown  08/2002   Dr. Deon Pilling   INCISION AND DRAINAGE FOOT     INCISIONAL HERNIA REPAIR N/A 01/20/2013   Procedure: HERNIA REPAIR INCISIONAL;  Surgeon: Zenovia Jarred, MD;  Location: [place];  Service: General;  Laterality: N/A;   [place]  04/13/2014   with mesh    dr Alene Mires HERNIA REPAIR N/A 04/13/2014   Procedure: OPEN REPAIR INCISIONAL HERNIA ;  Surgeon: Georganna Skeans, MD;  Location: [place];  Service: General;  Laterality: N/A;   INSERTION OF MESH N/A 04/13/2014   Procedure: INSERTION OF MESH;  Surgeon: Georganna Skeans, MD;  Location: [place];  Service: General;  Laterality: N/A;   KNEE ARTHROSCOPY Right 2010   LAPAROSCOPIC CHOLECYSTECTOMY  10/16/2014   LAPAROTOMY N/A 01/20/2013   Procedure: EXPLORATORY LAPAROTOMY;  Surgeon: Zenovia Jarred, MD;  Location: [place];  Service: General;  Laterality: N/A;   LAPAROTOMY N/A 03/10/2017   Procedure: EXPLORATORY LAPAROTOMY;  Surgeon: Georganna Skeans, MD;  Location: [place];  Service: General;  Laterality: N/A;   Laparotomy with lysis of adhesions  2010   Dr. Grandville Silos - SBO   LYSIS OF ADHESION N/A 01/20/2013   Procedure: LYSIS OF ADHESION;  Surgeon: Zenovia Jarred, MD;  Location: [place];  Service: General;  Laterality: N/A;   LYSIS OF ADHESION N/A 03/10/2017   Procedure: LYSIS OF ADHESION;  Surgeon: Georganna Skeans, MD;  Location: [place];  Service: General;  Laterality: N/A;   microsurgical  03/24/2002   Lateral recess decompression L5   SACRAL NERVE STIMULATOR PLACEMENT     inserted and removed   SHOULDER ARTHROSCOPY Bilateral    VASECTOMY      Current Outpatient Medications  Medication Sig Dispense Refill   apixaban (ELIQUIS) 5 MG TABS tablet Take 1 tablet (5 mg total) by mouth 2 (two) times daily. 42 tablet 0    fluticasone (FLONASE) 50 MCG/ACT nasal spray Place 1 spray into both nostrils at bedtime.     loratadine-pseudoephedrine (CLARITIN-D 24 HOUR) 10-240 MG 24 hr tablet Take 1 tablet by mouth as needed.     metoprolol tartrate (LOPRESSOR) 25 MG tablet Take 1 tablet (25 mg total) by mouth 2 (two) times daily. 60 tablet 1   morphine (MS CONTIN) 30 MG 12 hr tablet Take 30 mg by mouth every 12 (twelve) hours.  0   traMADol (ULTRAM) 50 MG tablet Take 50 mg by mouth every 8 (eight) hours as needed.     No current facility-administered medications for this encounter.    Allergies  Allergen Reactions   Amoxicillin-Pot Clavulanate Diarrhea   Aspirin     Other Reaction(s): dr said not to   Cephalexin Itching   Gabapentin Other (See Comments)   Naproxen Itching   Pregabalin     Other Reaction(s): never have taken it   Hydrocodone-Acetaminophen Itching and Rash    Social History   Socioeconomic History   Marital status: Married    Spouse name: Not on file   Number of children: Not on file   Years of education: Not on file   Highest education level: Not on file  Occupational History   Not on file  Tobacco Use   Smoking status: Former   Smokeless tobacco: Former    Types: Chew    Quit date: 03/05/1986  Vaping Use   Vaping Use: Never used  Substance and Sexual Activity   Alcohol use: No   Drug use: No   Sexual activity: Not on file  Other Topics Concern   Not on file  Social History Narrative   Married for 76 years, 3 granddaughters   Social Determinants of Health   Financial Resource Strain: Not on file  Food Insecurity: No Food Insecurity (07/05/2022)   Hunger Vital Sign    Worried About Running Out of Food in the Last Year: Never true    Ran Out of Food in the Last Year: Never true  Transportation Needs: No Transportation Needs (07/05/2022)   PRAPARE - Hydrologist (Medical): No    Lack of Transportation (Non-Medical): No  Physical Activity: Not on  file  Stress: Not on file  Social Connections: Not on file  Intimate Partner Violence: Not At Risk (07/05/2022)   Humiliation, Afraid, Rape, and Kick questionnaire    Fear of Current or Ex-Partner: No    Emotionally Abused: No    Physically Abused: No    Sexually Abused: No    Family History  Problem Relation Age of Onset   Heart disease Mother    Brain cancer Father     ROS- All systems are reviewed and negative except as per the HPI above  Physical Exam: Vitals:   08/26/22 1436  BP: (!) 106/50  Pulse: (!) 57  Weight: 71.5 kg  Height: 5\' 10"  (1.778 m)    Wt Readings from Last 3 Encounters:  08/26/22 71.5 kg  07/23/22 68.4 kg  07/04/22 63.5 kg    Labs: Lab Results  Component Value Date   NA 139 07/05/2022   K 4.1 07/05/2022   CL 107 07/05/2022   CO2 23 07/05/2022   GLUCOSE 97 07/05/2022   BUN 20 07/05/2022   CREATININE 1.38 (H) 07/05/2022   CALCIUM 8.8 (L) 07/05/2022   PHOS 3.2 09/02/2016   MG 2.2 07/04/2022   Lab Results  Component Value Date   INR 0.94 01/19/2013   No results found for: "CHOL", "HDL", "LDLCALC", "TRIG"   GEN- The patient is a well appearing elderly male, alert and oriented x 3 today.   HEENT-head normocephalic, atraumatic, sclera clear, conjunctiva pink, hearing intact, trachea midline. Lungs- Clear to ausculation bilaterally, normal work of breathing Heart- Regular rate and rhythm, no murmurs, rubs or gallops  GI- soft, NT, ND, + BS Extremities- no clubbing, cyanosis, or edema MS- no significant deformity or atrophy Skin- no rash or lesion Psych- euthymic mood, full affect Neuro- strength and sensation are intact   EKG today demonstrates SB, 1st degree AV block Vent. rate 57 BPM PR interval 206 ms QRS duration 94 ms QT/QTcB 406/395 ms  Echo 07/05/22 1. Left ventricular ejection fraction, by estimation, is 55 to 60%. The  left ventricle has normal function. The left ventricle has no regional  wall motion abnormalities. Left  ventricular diastolic function could not  be evaluated.   2. Right ventricular systolic function is normal. The right ventricular  size is normal.   3. Left atrial size was mild to moderately dilated.   4. The mitral valve is normal in structure. Trivial mitral valve  regurgitation. No evidence of mitral stenosis.   5. The aortic valve is tricuspid. Aortic valve regurgitation is not  visualized. Aortic valve sclerosis is present, with no evidence of aortic  valve stenosis.   6. The inferior vena cava is normal in size with greater than 50%  respiratory variability, suggesting right atrial pressure of 3 mmHg.    CHA2DS2-VASc Score = 2  The patient's score is based upon: CHF History: 0 HTN History: 0 Diabetes History: 0 Stroke History: 0 Vascular Disease History: 0 Age Score: 2 Gender Score: 0       ASSESSMENT AND PLAN: 1. Paroxysmal Atrial Fibrillation (ICD10:  I48.0) The patient's CHA2DS2-VASc score is 2, indicating a 2.2% annual risk of stroke.   Occurred in the setting of COVID infection.  Patient appears to be maintaining SR.  Continue Lopressor 12.5 mg BID Continue Eliquis 5 mg BID, samples and patient assistance forms given. Has been referred for Librexia   2. Secondary Hypercoagulable State (ICD10:  D68.69) The patient is at significant risk for stroke/thromboembolism based upon his CHA2DS2-VASc Score of 2.  Continue Apixaban (Eliquis).    Will refer to establish care with primary cardiologist. AF clinic as needed.    Addendum: Patient is taking Lopressor 25 mg BID.    [facility] [address] [place], [place] [address] [phone]

## 2022-09-03 ENCOUNTER — Other Ambulatory Visit (HOSPITAL_COMMUNITY): Payer: Self-pay

## 2022-09-03 MED ORDER — APIXABAN 5 MG PO TABS: 5.0000 mg | ORAL_TABLET | Freq: Two times a day (BID) | ORAL | 0 refills | Status: DC

## 2022-09-11 ENCOUNTER — Telehealth: Payer: Self-pay | Admitting: Cardiovascular Disease

## 2022-09-11 NOTE — Telephone Encounter (Signed)
Confirmed with patient taking metoprolol 25mg  BID. RX was sent to [facility] and is ready for pickup. Pt states will pick up from [place].

## 2022-09-11 NOTE — Addendum Note (Signed)
Encounter addended by: Oliver Barre, PA on: 09/11/2022 1:39 PM  Actions taken: Clinical Note Signed

## 2022-09-11 NOTE — Telephone Encounter (Signed)
*  STAT* If patient is at the pharmacy, call can be transferred to refill team.   1. Which medications need to be refilled? (please list name of each medication and dose if known) metoprolol tartrate (LOPRESSOR) 25 MG tablet   2. Which pharmacy/location (including street and city if local pharmacy) is medication to be sent to? [id number] [place],  - [place]. AT [address] [place]   3. Do they need a 30 day or 90 day supply? Pt only has about 1 day left of medication. Pt has a np appt set up to see Dr. Acie Fredrickson on 4/24. He just needs enough to get him to his appt on 4/24.

## 2022-09-15 ENCOUNTER — Telehealth: Payer: Self-pay | Admitting: Cardiovascular Disease

## 2022-09-15 NOTE — Telephone Encounter (Signed)
Pt called in stating that on 3/16, he couldn't answer any questions, and that he seemed to have forgot everything. He states he was being asked questions that he should've known. He says he is fine now and is still taking all of his meds. He states that this episode lasted about an hr. Afterwards they went to eat, was fine then. He wants Dr. Elmarie Shiley opinion on what to do or should do. Pt thinks he may have had a small stroke. Pt states that he didn't have numbness, or weakness. There was no confusion or difficulty understanding speech or trouble speaking.Marland Kitchen other than the inability to answer questions. There was no blurry vision, or inability to walk, nor loss of balance.

## 2022-09-15 NOTE — Telephone Encounter (Signed)
Attempted to reach patient, no answer. Per DPR, can leave details on voicemail. Left detailed message explaining that he should reach out to PCP. Unsure if this could be antagonized by blood glucose (says he was going to eat, but don't know if he did) or what his BP was. Asked that he call his PCP, but if this is the first time, there likely wouldn't be much to do about it, but they may be willing to place a referral to neurology. MRI won't show signs of TIA, so again, not sure what they would do. Did ask that he call back if he wanted to discuss further.

## 2022-10-07 ENCOUNTER — Telehealth: Payer: Self-pay | Admitting: *Deleted

## 2022-10-07 DIAGNOSIS — I700 Atherosclerosis of aorta: Secondary | ICD-10-CM

## 2022-10-07 NOTE — Telephone Encounter (Signed)
   Pre-operative Risk Assessment    Patient Name: Kent [name]  DOB: 1941/02/17 MRN: [id number]    PT IS GOING TO NEED A NEW PT APPT WITH GEN CARD, PT IS FOLLOWED BY [facility] CLINIC AND HAS NOT BEEN SEEN BY GEN CARD SINCE 2015 WITH DR. Melburn Popper. I WILL SEE IF OUR SCHEDULING TEAM MAY BE ABLE TO ASSIST IN FINDING A NEW PT APPT BEFORE THE PT'S PROCEDURE.   Request for Surgical Clearance    Procedure:   FLEX SIGMOID  Date of Surgery:  Clearance 10/20/22                                 Surgeon:  DR. Ewing Schlein Surgeon's Group or Practice Name:  [facility] Phone number:  [phone] Fax number:  [phone]   Type of Clearance Requested:   - Medical  - Pharmacy:  Hold Apixaban (Eliquis) x 2 DAYS PRIOR TO PROCEDURE    Type of Anesthesia:  Not Indicated   Additional requests/questions:    [name]   10/07/2022, 5:36 PM

## 2022-10-08 ENCOUNTER — Other Ambulatory Visit (HOSPITAL_COMMUNITY): Payer: Self-pay | Admitting: *Deleted

## 2022-10-08 MED ORDER — METOPROLOL TARTRATE 25 MG PO TABS: 25.0000 mg | ORAL_TABLET | Freq: Two times a day (BID) | ORAL | 6 refills | Status: DC

## 2022-10-08 NOTE — Telephone Encounter (Signed)
   Name: Kent [name]  DOB: [birth year]  MRN: [id number]  Primary Cardiologist: None  Chart reviewed as part of pre-operative protocol coverage. Because of Kent [name]'s past medical history and time since last visit, he will require a follow-up in-office visit in order to better assess preoperative cardiovascular risk.  Pre-op covering staff: - Please schedule appointment and call patient to inform them. If patient already had an upcoming appointment within acceptable timeframe, please add "pre-op clearance" to the appointment notes so provider is aware. - Please contact requesting surgeon's office via preferred method (i.e, phone, fax) to inform them of need for appointment prior to surgery.  This message will also be routed to pharmacy pool for input on holding Eliquis as requested below so that this information is available to the clearing provider at time of patient's appointment.   Sharlene Dory, PA-C  10/08/2022, 8:00 AM

## 2022-10-08 NOTE — Telephone Encounter (Signed)
Will need to re-establish with [facility] MD before anticoag clearance can be provided. Has appt on 4/24, can provide clearance after.

## 2022-10-15 ENCOUNTER — Encounter: Payer: Self-pay | Admitting: Cardiovascular Disease

## 2022-10-15 ENCOUNTER — Ambulatory Visit: Payer: Medicare Other | Attending: Cardiovascular Disease | Admitting: Cardiovascular Disease

## 2022-10-15 VITALS — BP 96/42 | HR 48 | Ht 70.0 in | Wt 159.4 lb

## 2022-10-15 DIAGNOSIS — Z79899 Other long term (current) drug therapy: Secondary | ICD-10-CM | POA: Diagnosis present

## 2022-10-15 DIAGNOSIS — I480 Paroxysmal atrial fibrillation: Secondary | ICD-10-CM | POA: Insufficient documentation

## 2022-10-15 DIAGNOSIS — D6869 Other thrombophilia: Secondary | ICD-10-CM

## 2022-10-15 MED ORDER — DABIGATRAN ETEXILATE MESYLATE 150 MG PO CAPS
150.0000 mg | ORAL_CAPSULE | Freq: Two times a day (BID) | ORAL | 3 refills | Status: DC
Start: 2022-10-15 — End: 2023-11-04

## 2022-10-15 MED ORDER — METOPROLOL SUCCINATE ER 25 MG PO TB24: 12.5000 mg | ORAL_TABLET | Freq: Every day | ORAL | 3 refills | Status: DC

## 2022-10-15 NOTE — Progress Notes (Unsigned)
Cardiology Office Note:    Date:  10/15/2022   ID:  Kent, [name] [birth year], MRN [id number]  PCP:  Johny Blamer, MD   [facility] Providers Cardiologist:  Holiday Mcmenamin     Referring MD: Danice Goltz, [place]   Chief Complaint  Patient presents with   Atrial Fibrillation     History of Present Illness:    Kent [name] is a 82 y.o. male with a hx of  PAF, SBO,   I saw him for a pre op evaluation in 2015 prior to hernia repair. He was recently found to have atrial fib when he presented to his primary MDs office. 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       Cancer    basal cell cancer - skin   Chronic back pain    s/p back surgery   GERD (gastroesophageal reflux disease) 08/18/2013   Incisional hernia, without obstruction or gangrene 02/02/2013   Postural dizziness with presyncope 09/01/2016   Sinus infection    currently on antibiotics   Small bowel obstruction due to adhesions     Past Surgical History:  Procedure Laterality Date   BACK SURGERY  08/25/2001   Neck 3 disk replacements   BACK SURGERY     Placement/removal of nerve stimulator   CHOLECYSTECTOMY N/A 10/16/2014   Procedure: LAPAROSCOPIC CHOLECYSTECTOMY WITH ATTEMPTED CHOLANGIOGRAM;  Surgeon: Violeta Gelinas, MD;  Location: [facility] OR;  Service: General;  Laterality: N/A;   COLON SURGERY  04/2002   Dr. Orson Slick - Subtotal colectomy, ileostomy   ELBOW SURGERY Right    for blood vessel & " hung" nerve   ESOPHAGEAL DILATION     EXPLORATORY LAPAROTOMY  03/10/2017    HERNIA REPAIR     Ileostomy Takedown  08/2002   Dr. Orson Slick   INCISION AND DRAINAGE FOOT     INCISIONAL HERNIA REPAIR N/A 01/20/2013   Procedure: HERNIA REPAIR INCISIONAL;  Surgeon: Liz Malady, MD;  Location: [facility] OR;  Service: General;  Laterality: N/A;   INCISIONAL HERNIA REPAIR  04/13/2014   with mesh    dr Nelda Severe HERNIA REPAIR N/A 04/13/2014   Procedure: OPEN REPAIR INCISIONAL HERNIA ;  Surgeon: Violeta Gelinas, MD;  Location: [facility] OR;  Service: General;  Laterality: N/A;   INSERTION OF MESH N/A 04/13/2014   Procedure: INSERTION OF MESH;  Surgeon: Violeta Gelinas, MD;  Location: [facility] OR;  Service: General;  Laterality: N/A;   KNEE ARTHROSCOPY Right 2010   LAPAROSCOPIC CHOLECYSTECTOMY  10/16/2014   LAPAROTOMY N/A 01/20/2013   Procedure: EXPLORATORY LAPAROTOMY;  Surgeon: Liz Malady, MD;  Location: [facility] OR;  Service: General;  Laterality: N/A;   LAPAROTOMY N/A 03/10/2017   Procedure: EXPLORATORY LAPAROTOMY;  Surgeon: Violeta Gelinas, MD;  Location: [facility] OR;  Service: General;  Laterality: N/A;   Laparotomy with lysis of adhesions  2010   Dr. Janee Morn -  SBO   LYSIS OF ADHESION N/A 01/20/2013   Procedure: LYSIS OF ADHESION;  Surgeon: Liz Malady, MD;  Location: [facility] OR;  Service: General;  Laterality: N/A;   LYSIS OF ADHESION N/A 03/10/2017   Procedure: LYSIS OF ADHESION;  Surgeon: Violeta Gelinas, MD;  Location: [facility] OR;  Service: General;  Laterality: N/A;   microsurgical  03/24/2002   Lateral recess decompression L5   SACRAL NERVE STIMULATOR PLACEMENT     inserted and removed   SHOULDER ARTHROSCOPY Bilateral    VASECTOMY      Current Medications: Current Meds  Medication Sig   apixaban (ELIQUIS) 5 MG TABS tablet Take 1 tablet (5 mg total) by mouth 2 (two) times daily.   cholestyramine (QUESTRAN) 4 g packet Take 1 packet by mouth daily.   fluticasone (FLONASE) 50 MCG/ACT nasal spray Place 1 spray into both nostrils at bedtime.   loratadine-pseudoephedrine (CLARITIN-D 24  HOUR) 10-240 MG 24 hr tablet Take 1 tablet by mouth as needed.   metoprolol succinate (TOPROL XL) 25 MG 24 hr tablet Take 0.5 tablets (12.5 mg total) by mouth daily.   morphine (MS CONTIN) 30 MG 12 hr tablet Take 30 mg by mouth every 12 (twelve) hours.   [DISCONTINUED] metoprolol tartrate (LOPRESSOR) 25 MG tablet Take 1 tablet (25 mg total) by mouth 2 (two) times daily.     Allergies:   Amoxicillin-pot clavulanate, Aspirin, Cephalexin, Gabapentin, Naproxen, Pregabalin, and Hydrocodone-acetaminophen   Social History   Socioeconomic History   Marital status: Married    Spouse name: Not on file   Number of children: Not on file   Years of education: Not on file   Highest education level: Not on file  Occupational History   Not on file  Tobacco Use   Smoking status: Former   Smokeless tobacco: Former    Types: Chew    Quit date: 03/05/1986  Vaping Use   Vaping Use: Never used  Substance and Sexual Activity   Alcohol use: No   Drug use: No   Sexual activity: Not on file  Other Topics Concern   Not on file  Social History Narrative   Married for 45 years, 3 granddaughters   Social Determinants of Health   Financial Resource Strain: Not on file  Food Insecurity: No Food Insecurity (07/05/2022)   Hunger Vital Sign    Worried About Running Out of Food in the Last Year: Never true    Ran Out of Food in the Last Year: Never true  Transportation Needs: No Transportation Needs (07/05/2022)   PRAPARE - Administrator, Civil Service (Medical): No    Lack of Transportation (Non-Medical): No  Physical Activity: Not on file  Stress: Not on file  Social Connections: Not on file     Family History: The patient's family history includes Brain cancer in his father; Heart disease in his mother.  ROS:   Please see the history of present illness.     All other systems reviewed and are negative.  EKGs/Labs/Other Studies Reviewed:    The following studies were reviewed  today:   EKG:  08/26/22: sinus bradycardia ,  no ST or T wave changes.   Recent Labs: 07/04/2022: B Natriuretic Peptide 213.2; Hemoglobin 12.7; Magnesium 2.2; Platelets 288 07/05/2022: BUN 20; Creatinine, Ser 1.38; Potassium 4.1; Sodium 139; TSH 1.011  Recent Lipid Panel No results found for: "CHOL", "TRIG", "HDL", "CHOLHDL", "VLDL", "LDLCALC", "LDLDIRECT"   Risk Assessment/Calculations:    CHA2DS2-VASc Score = 2  1} This indicates a 2.2% annual risk of stroke. The patient's score is based upon: CHF History: 0 HTN History: 0 Diabetes History: 0 Stroke History: 0 Vascular Disease History: 0 Age Score: 2 Gender Score: 0               Physical Exam:    VS:  BP (!) 96/42   Pulse (!) 48   Ht 5\' 10"  (1.778 m)   Wt 159 lb 6.4 oz (72.3 kg)   SpO2 99%   BMI 22.87 kg/m     Wt Readings from Last 3 Encounters:  10/15/22 159 lb 6.4 oz (72.3 kg)  08/26/22 157 lb 9.6 oz (71.5 kg)  07/23/22 150 lb 12.8 oz (68.4 kg)     GEN:  Well nourished, well developed in no acute distress HEENT: Normal NECK: No JVD; No carotid bruits LYMPHATICS: No lymphadenopathy CARDIAC: RRR, no murmurs, rubs, gallops RESPIRATORY:  Clear to auscultation without rales, wheezing or rhonchi  ABDOMEN: Soft, non-tender, non-distended MUSCULOSKELETAL:  No edema; No deformity  SKIN: Warm and dry NEUROLOGIC:  Alert and oriented x 3 PSYCHIATRIC:  Normal affect   ASSESSMENT:    No diagnosis found. PLAN:      Paroxysmal atrial fib:  he was found to have PAF.   Converted to sinus rhythm in the hospital .  Has sinus bradycardia today   He is having difficulty affording his Eliquis.    We had him meet with Margaretmary Dys, PharmD. They discussed various options and concluded that now that Pradaxa (which is now generic) would be the least expensive route.  DC eliquis  Will change to Pradaxa 150 mg BID  Check BMP today    2. Sinus bradycardia:   will change his metoprolol to Toprol XL and reduce the dose  to 12.5 mg a day    Will have him see an APP in 3 months .                Medication Adjustments/Labs and Tests Ordered: Current medicines are reviewed at length with the patient today.  Concerns regarding medicines are outlined above.  No orders of the defined types were placed in this encounter.  Meds ordered this encounter  Medications   metoprolol succinate (TOPROL XL) 25 MG 24 hr tablet    Sig: Take 0.5 tablets (12.5 mg total) by mouth daily.    Dispense:  45 tablet    Refill:  3    Replaces Lopressor    Patient Instructions  Medication Instructions:  STOP Metoprolol Tartrate START Toprol XL (metoprolol succinate) 12.5mg  daily *If you need a refill on your cardiac medications before your next appointment, please call your pharmacy*   Lab Work: NONE If you have labs (blood work) drawn today and your tests are completely normal, you will receive your results only by: MyChart Message (if you have MyChart) OR A paper copy in the mail If you have any lab test that is abnormal or we need to change your treatment, we will call you to review the results.   Testing/Procedures: NONE   Follow-Up: At [facility], you and your health needs are our priority.  As part of our continuing mission to provide you with exceptional heart care, we have created designated Provider Care Teams.  These Care Teams include your primary Cardiologist (physician) and Advanced Practice Providers (APPs -  Physician Assistants and Nurse Practitioners) who all work together to provide you with the care you need, when you need it.  We recommend signing up for the patient portal called "MyChart".  Sign up information is provided on this After Visit Summary.  MyChart is used to connect with patients for Virtual Visits (Telemedicine).  Patients are able to view lab/test results, encounter notes, upcoming appointments, etc.  Non-urgent messages can be sent to your provider as well.   To  learn more about what you can do with MyChart, go to [email or web address].    Your next appointment:   3 month(s)  Provider:   Tereso Newcomer or Eligha Bridegroom, NP       Signed, Kristeen Miss, MD  10/15/2022 3:00 PM    [facility]

## 2022-10-15 NOTE — Patient Instructions (Addendum)
Medication Instructions:  STOP Metoprolol Tartrate STOP Eliquis START Toprol XL (metoprolol succinate) 12.5mg  daily START Pradaxa  twice daily *If you need a refill on your cardiac medications before your next appointment, please call your pharmacy*   Lab Work: BMET today If you have labs (blood work) drawn today and your tests are completely normal, you will receive your results only by: MyChart Message (if you have MyChart) OR A paper copy in the mail If you have any lab test that is abnormal or we need to change your treatment, we will call you to review the results.   Testing/Procedures: NONE   Follow-Up: At [facility], you and your health needs are our priority.  As part of our continuing mission to provide you with exceptional heart care, we have created designated Provider Care Teams.  These Care Teams include your primary Cardiologist (physician) and Advanced Practice Providers (APPs -  Physician Assistants and Nurse Practitioners) who all work together to provide you with the care you need, when you need it.  We recommend signing up for the patient portal called "MyChart".  Sign up information is provided on this After Visit Summary.  MyChart is used to connect with patients for Virtual Visits (Telemedicine).  Patients are able to view lab/test results, encounter notes, upcoming appointments, etc.  Non-urgent messages can be sent to your provider as well.   To learn more about what you can do with MyChart, go to [email or web address].    Your next appointment:   3 month(s)  Provider:   Tereso Newcomer or Eligha Bridegroom, NP

## 2022-10-16 DIAGNOSIS — I700 Atherosclerosis of aorta: Secondary | ICD-10-CM | POA: Insufficient documentation

## 2022-10-16 LAB — BASIC METABOLIC PANEL
BUN/Creatinine Ratio: 17 (ref 10–24)
BUN: 30 mg/dL — ABNORMAL HIGH (ref 8–27)
CO2: 25 mmol/L (ref 20–29)
Calcium: 9.3 mg/dL (ref 8.6–10.2)
Chloride: 98 mmol/L (ref 96–106)
Creatinine, Ser: 1.76 mg/dL — ABNORMAL HIGH (ref 0.76–1.27)
Glucose: 85 mg/dL (ref 70–99)
Potassium: 5.2 mmol/L (ref 3.5–5.2)
Sodium: 135 mmol/L (ref 134–144)
eGFR: 39 mL/min/{1.73_m2} — ABNORMAL LOW (ref >59)

## 2022-10-16 NOTE — Telephone Encounter (Signed)
I will update the requesting office the pt was seen yesterday. I have asked the pre op APP to review as I did not see notes from MD if cleared. Once cleared we will send notes. Pt had to re-est as new pt.

## 2022-10-16 NOTE — Telephone Encounter (Signed)
Patient with diagnosis of afib on Pradaxa for anticoagulation.    Procedure: flex sigmoid Date of procedure: 10/20/22  CHA2DS2-VASc Score = 3  This indicates a 3.2% annual risk of stroke. The patient's score is based upon: CHF History: 0 HTN History: 0 Diabetes History: 0 Stroke History: 0 Vascular Disease History: 1 Age Score: 2 Gender Score: 0   Note yesterday mentions pt thinks he previously had a TIA, wasn't evaluated/confirmed in healthcare setting so will not count as RF.  CrCl 20mL/min Platelet count 288K  Pt changed from Eliquis to Pradaxa yesterday for cheaper cost. He should hold Pradaxa for 4 days prior to procedure given his renal dysfunction - he will have to start holding anticoag today.  **This guidance is not considered finalized until pre-operative APP has relayed final recommendations.**

## 2022-10-17 ENCOUNTER — Telehealth: Payer: Self-pay | Admitting: Cardiovascular Disease

## 2022-10-17 DIAGNOSIS — Z79899 Other long term (current) drug therapy: Secondary | ICD-10-CM

## 2022-10-17 DIAGNOSIS — R7989 Other specified abnormal findings of blood chemistry: Secondary | ICD-10-CM

## 2022-10-17 NOTE — Telephone Encounter (Signed)
   Patient Name: Kent [name]  DOB: 1940-07-12 MRN: [id number]  Primary Cardiologist: None  Chart reviewed as part of pre-operative protocol coverage. Given past medical history and time since last visit, based on ACC/AHA guidelines, Kent [name] is at acceptable risk for the planned procedure without further cardiovascular testing.   Per Dr. Elease Hashimoto, primary cardiologist, "The patient is at low risk for his flex sigmoidoscopy."  Pt changed from Eliquis to Pradaxa on 10/15/2022 for cheaper cost. He should hold Pradaxa for 4 days prior to procedure given his renal dysfunction. Please resume Pradaxa as soon as possible post procedure, at the discretion of the surgeon.   I will route this recommendation to the requesting party via Epic fax function and remove from pre-op pool.  Please call with questions.  Joylene Grapes, NP 10/17/2022, 7:42 AM

## 2022-10-17 NOTE — Telephone Encounter (Signed)
-----   Message from Vesta Mixer, MD sent at 10/16/2022  8:00 AM EDT ----- Creatinine is slightly elevated.    Will ask our pharmacy team to ensure his Pradaxa dose is still appropriate. Will Encourage him to drink more water.  Recheck BMP in 2 weeks

## 2022-10-17 NOTE — Telephone Encounter (Signed)
Called and spoke with patient who denies using any NSAIDS or OTC pain medications at all. Confirms he only uses Morphine. Advised that would not affect his kidney functioning. Repeat BMET scheduled for 10/31/22. He was switched to Pradaxa from Eliquis for money reasons, states he won't be starting the pradaxa for 14 days due to still having Eliquis on hand from last refill.

## 2022-10-17 NOTE — Telephone Encounter (Signed)
The patient is at low risk for his flex sigmoidoscopy Anticoagulation instructions were provided yesterday by Margaretmary Dys, RPH.

## 2022-10-21 ENCOUNTER — Other Ambulatory Visit (HOSPITAL_COMMUNITY): Payer: Self-pay | Admitting: Gastroenterology

## 2022-10-21 DIAGNOSIS — R1310 Dysphagia, unspecified: Secondary | ICD-10-CM

## 2022-10-30 ENCOUNTER — Ambulatory Visit
Admission: RE | Admit: 2022-10-30 | Discharge: 2022-10-30 | Disposition: A | Discharge status: Home / Self Care | Payer: Medicare Other | Source: Ambulatory Visit | Attending: Gastroenterology | Admitting: Gastroenterology

## 2022-10-30 DIAGNOSIS — Z79899 Other long term (current) drug therapy: Secondary | ICD-10-CM | POA: Diagnosis present

## 2022-10-30 DIAGNOSIS — R7989 Other specified abnormal findings of blood chemistry: Secondary | ICD-10-CM | POA: Insufficient documentation

## 2022-10-30 DIAGNOSIS — R1310 Dysphagia, unspecified: Secondary | ICD-10-CM | POA: Diagnosis present

## 2022-10-31 ENCOUNTER — Telehealth: Payer: Self-pay | Admitting: Cardiovascular Disease

## 2022-10-31 ENCOUNTER — Ambulatory Visit: Payer: Medicare Other

## 2022-10-31 DIAGNOSIS — Z79899 Other long term (current) drug therapy: Secondary | ICD-10-CM

## 2022-10-31 DIAGNOSIS — R7989 Other specified abnormal findings of blood chemistry: Secondary | ICD-10-CM

## 2022-10-31 NOTE — Telephone Encounter (Signed)
Pt c/o medication issue:  1. Name of Medication: Eliquis   2. How are you currently taking this medication (dosage and times per day)?  3. Are you having a reaction (difficulty breathing--STAT)? No  4. What is your medication issue? Patient called stating they are out of Eliquis and would like to know if they can come by the office today to get more samples. Please advise.

## 2022-10-31 NOTE — Telephone Encounter (Signed)
Returned call and spoke to patient to reiterate/remind him that we had switched his Eliquis to dabigatran (generic pradaxa) to save him money at the last visit. RX was sent to pharmacy on 10/15/22 for 90 days. He states he still has some Eliquis left, but was running low. Told him to call [facility] who has the new rx and inquire about his price, but to only fill a month at a time if it was too expensive for him. States he will and appreciates call.

## 2022-11-01 LAB — BASIC METABOLIC PANEL
BUN/Creatinine Ratio: 18 (ref 10–24)
BUN: 23 mg/dL (ref 8–27)
CO2: 25 mmol/L (ref 20–29)
Calcium: 9.1 mg/dL (ref 8.6–10.2)
Chloride: 98 mmol/L (ref 96–106)
Creatinine, Ser: 1.27 mg/dL (ref 0.76–1.27)
Glucose: 95 mg/dL (ref 70–99)
Potassium: 4.8 mmol/L (ref 3.5–5.2)
Sodium: 135 mmol/L (ref 134–144)
eGFR: 57 mL/min/{1.73_m2} — ABNORMAL LOW (ref >59)

## 2022-11-19 ENCOUNTER — Other Ambulatory Visit (HOSPITAL_COMMUNITY): Payer: Self-pay

## 2022-11-19 DIAGNOSIS — R059 Cough, unspecified: Secondary | ICD-10-CM

## 2022-11-19 DIAGNOSIS — R1310 Dysphagia, unspecified: Secondary | ICD-10-CM

## 2022-11-25 ENCOUNTER — Ambulatory Visit (HOSPITAL_COMMUNITY)
Admission: RE | Admit: 2022-11-25 | Discharge: 2022-11-25 | Disposition: A | Discharge status: Home / Self Care | Payer: Medicare Other | Source: Ambulatory Visit | Attending: Family Medicine | Admitting: Family Medicine

## 2022-11-25 DIAGNOSIS — R933 Abnormal findings on diagnostic imaging of other parts of digestive tract: Secondary | ICD-10-CM | POA: Insufficient documentation

## 2022-11-25 DIAGNOSIS — R059 Cough, unspecified: Secondary | ICD-10-CM

## 2022-11-25 DIAGNOSIS — R1310 Dysphagia, unspecified: Secondary | ICD-10-CM

## 2022-11-25 DIAGNOSIS — I480 Paroxysmal atrial fibrillation: Secondary | ICD-10-CM | POA: Diagnosis not present

## 2022-11-25 DIAGNOSIS — K219 Gastro-esophageal reflux disease without esophagitis: Secondary | ICD-10-CM | POA: Diagnosis not present

## 2022-11-25 NOTE — Therapy (Addendum)
Modified Barium Swallow Study  Patient Details  Name: Kent [name] MRN: [id number] Date of Birth: April 04, 1941  Today's Date: 11/25/2022  HPI/PMH: HPI: Kent [name] is a 82 y.o. male with PMH: PAF, SBO, GERD, arthritis, chronic back pain, h/o 3 level ACDF(he states it was done in 2003). He had complaints of recent worsening of his long term dysphagia, reporting globus/choking sensation with liquids, solids and pills. He has had an esophageal dilation(he reports it was done "twenty years ago") in the past but it did not apparently improve his symptoms. He had an esophagram 10/30/22 which reported prominent cricopharyngeal bar, small amount of residue in pyriform sinuses and an incident of aspiration of small amount of barium which occured prior to prone motility examination. MBS ordered due to aspiration on esophagram.   Clinical Impression: Clinical Impression: Kent [name] presents with a mild pharyngoesophageal phase dysphagia as per this MBS. The following consistencies: thin, nectar thick, honey thick, puree, mechanical soft solid. Swallow was initiated at level of vallecular sinus for solids and thick liquids, however with large sips of thin liquids, swallow was initiated when head of bolus was past vallecular sinus. Epiglottis was curled but completely  inverted and laryngeal vestibular closure was complete. Anterior hyoid excursion was judged to be partial. After initial swallow with liquids, patient exhibited mild vallecular and pyriform residuals, which cleared with dry swallows. No penetration or aspiration observed, even with large straw sips of thin liquids. Trace amount of barium which had remained above cricopharyngeal bar did transit up through esophagus but remained under PES.  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 SLP is not recommending any changes to his current PO consistencies and he already  self-manages his chronic dysphagia. SLP did recommend that patient perform dry swallow to clear residuals, and avoid lying down after eating. SLP showed patient image online of a cricopharyngeal bar and educated him on its impact on his swallow function.  Factors that may increase risk of adverse event in presence of aspiration Kent [name] & Kent [name] 2021): No data recorded  Recommendations/Plan: Swallowing Evaluation Recommendations Swallowing Evaluation Recommendations Recommendations: PO diet PO Diet Recommendation: Regular; Thin liquids (Level 0) Liquid Administration via: Straw; Cup Medication Administration: Whole meds with liquid Supervision: Patient able to self-feed Swallowing strategies  : Small bites/sips Postural changes: Stay upright 30-60 min after meals; Position pt fully upright for meals    Treatment Plan Treatment Plan Treatment recommendations: No treatment recommended at this time Follow-up recommendations: No SLP follow up     Recommendations Recommendations for follow up therapy are one component of a multi-disciplinary discharge planning process, led by the attending physician.  Recommendations may be updated based on patient status, additional functional criteria and insurance authorization.  Assessment: Orofacial Exam: Orofacial Exam Oral Cavity: Oral Hygiene: WFL Oral Cavity - Dentition: Adequate natural dentition Orofacial Anatomy: WFL Oral Motor/Sensory Function: WFL    Anatomy:  Anatomy: Prominent cricopharyngeus   Boluses Administered: Boluses Administered Boluses Administered: Thin liquids (Level 0); Mildly thick liquids (Level 2, nectar thick); Moderately thick liquids (Level 3, honey thick); Puree; Solid     Oral Impairment Domain: Oral Impairment Domain Lip Closure: No labial escape Tongue control during bolus hold: Cohesive bolus between tongue to palatal seal Bolus preparation/mastication: Timely and efficient chewing and  mashing Bolus transport/lingual motion: Brisk tongue motion Oral residue: Complete oral Kent Location of oral residue : N/A Initiation of pharyngeal swallow : Valleculae; Posterior  laryngeal surface of the epiglottis     Pharyngeal Impairment Domain: Pharyngeal Impairment Domain Soft palate elevation: No bolus between soft palate (SP)/pharyngeal wall (PW) Laryngeal elevation: Complete superior movement of thyroid cartilage with complete approximation of arytenoids to epiglottic petiole Anterior hyoid excursion: Partial anterior movement Epiglottic movement: Complete inversion Laryngeal vestibule closure: Complete, no air/contrast in laryngeal vestibule Pharyngeal stripping wave : Present - complete Pharyngeal contraction (A/P view only): N/A Pharyngoesophageal segment opening: Complete distension and complete duration, no obstruction of flow Tongue base retraction: No contrast between tongue base and posterior pharyngeal wall (PPW) Pharyngeal residue: Collection of residue within or on pharyngeal structures Location of pharyngeal residue: Valleculae; Pyriform sinuses     Esophageal Impairment Domain: Esophageal Impairment Domain Esophageal Kent upright position: Esophageal retention with retrograde flow below pharyngoesophageal segment (PES)    Pill: Esophageal Impairment Domain Esophageal Kent upright position: Esophageal retention with retrograde flow below pharyngoesophageal segment (PES)    Penetration/Aspiration Scale Score: Penetration/Aspiration Scale Score 1.  Material does not enter airway: Thin liquids (Level 0); Mildly thick liquids (Level 2, nectar thick); Moderately thick liquids (Level 3, honey thick); Puree; Solid    Compensatory Strategies: Compensatory Strategies Compensatory strategies: Yes Straw: Effective Effective Straw: Thin liquid (Level 0)       General Information: No data recorded  Diet Prior to this Study: Regular; Thin  liquids (Level 0)    No data recorded   Respiratory Status: WFL    Supplemental O2: None (Room air)    History of Recent Intubation: No   Behavior/Cognition: Alert; Cooperative; Pleasant mood  Self-Feeding Abilities: Able to self-feed  Baseline vocal quality/speech: Normal  Volitional Cough: Able to elicit  Volitional Swallow: Able to elicit  Exam Limitations: No limitations   Goal Planning: No data recorded No data recorded No data recorded No data recorded Consulted and agree with results and recommendations: Patient   Pain: No data recorded  End of Session: Start Time:SLP Start Time (ACUTE ONLY): 1130  Stop Time: SLP Stop Time (ACUTE ONLY): 1150  Time Calculation:SLP Time Calculation (min) (ACUTE ONLY): 20 min  Charges: SLP Evaluations $ SLP Speech Visit: 1 Visit  SLP Evaluations $Outpatient MBS Swallow: 1 Procedure   SLP visit diagnosis: SLP Visit Diagnosis: Dysphagia, pharyngoesophageal phase (R13.14)    Past Medical History:  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       Cancer (HCC)    basal cell cancer - skin   Chronic back pain    s/p back surgery   GERD (gastroesophageal reflux disease) 08/18/2013   Incisional hernia, without obstruction or gangrene 02/02/2013   Postural dizziness with presyncope 09/01/2016   Sinus infection    currently on antibiotics   Small bowel obstruction due to adhesions [facility])    Past Surgical History:  Past Surgical History:  Procedure Laterality Date   BACK SURGERY  08/25/2001   Neck 3 disk replacements   BACK SURGERY     Placement/removal of nerve stimulator   CHOLECYSTECTOMY N/A 10/16/2014   Procedure: LAPAROSCOPIC CHOLECYSTECTOMY WITH ATTEMPTED CHOLANGIOGRAM;  Surgeon: Violeta Gelinas, MD;  Location: [facility] OR;  Service: General;  Laterality: N/A;   COLON SURGERY  04/2002   Dr. Orson Slick - Subtotal colectomy, ileostomy   ELBOW SURGERY Right    for blood vessel & " hung" nerve    ESOPHAGEAL DILATION     EXPLORATORY LAPAROTOMY  03/10/2017   HERNIA REPAIR     Ileostomy Takedown  08/2002  Dr. Orson Slick   INCISION AND DRAINAGE FOOT     INCISIONAL HERNIA REPAIR N/A 01/20/2013   Procedure: HERNIA REPAIR INCISIONAL;  Surgeon: Liz Malady, MD;  Location: [facility] OR;  Service: General;  Laterality: N/A;   INCISIONAL HERNIA REPAIR  04/13/2014   with mesh    dr Nelda Severe HERNIA REPAIR N/A 04/13/2014   Procedure: OPEN REPAIR INCISIONAL HERNIA ;  Surgeon: Violeta Gelinas, MD;  Location: [facility] OR;  Service: General;  Laterality: N/A;   INSERTION OF MESH N/A 04/13/2014   Procedure: INSERTION OF MESH;  Surgeon: Violeta Gelinas, MD;  Location: [facility] OR;  Service: General;  Laterality: N/A;   KNEE ARTHROSCOPY Right 2010   LAPAROSCOPIC CHOLECYSTECTOMY  10/16/2014   LAPAROTOMY N/A 01/20/2013   Procedure: EXPLORATORY LAPAROTOMY;  Surgeon: Liz Malady, MD;  Location: [facility] OR;  Service: General;  Laterality: N/A;   LAPAROTOMY N/A 03/10/2017   Procedure: EXPLORATORY LAPAROTOMY;  Surgeon: Violeta Gelinas, MD;  Location: [facility] OR;  Service: General;  Laterality: N/A;   Laparotomy with lysis of adhesions  2010   Dr. Janee Morn - SBO   LYSIS OF ADHESION N/A 01/20/2013   Procedure: LYSIS OF ADHESION;  Surgeon: Liz Malady, MD;  Location: [facility] OR;  Service: General;  Laterality: N/A;   LYSIS OF ADHESION N/A 03/10/2017   Procedure: LYSIS OF ADHESION;  Surgeon: Violeta Gelinas, MD;  Location: [facility] OR;  Service: General;  Laterality: N/A;   microsurgical  03/24/2002   Lateral recess decompression L5   SACRAL NERVE STIMULATOR PLACEMENT     inserted and removed   SHOULDER ARTHROSCOPY Bilateral    VASECTOMY      Angela Nevin, MA, CCC-SLP Speech Therapy

## 2022-11-25 NOTE — Addendum Note (Signed)
Encounter addended by: Angela Nevin, CCC-SLP on: 11/25/2022 1:25 PM  Actions taken: Clinical Note Signed

## 2023-01-11 NOTE — Progress Notes (Deleted)
  Cardiology Office Note:  .   Date:  01/11/2023  ID:  Kent [name], DOB 27-Jul-1940, MRN [id number] PCP: Noberto Retort, MD  [facility] Providers Cardiologist:  None { Click to update primary MD,subspecialty MD or APP then REFRESH:1}   Patient Profile: .      PMH: PAF SBO Subtotal right colectomy GERD  Found to have A-fib upon presentation to PCP for shortness of breath in 2020.  Of note, he was also positive for COVID at the time.  Had abnormal lung CT showing nodules and possible interstitial lung disease.  Referred to cardiology and seen by Dr. Elease Hashimoto.  He has also been seen in A-fib clinic.  Echo 07/05/2022 revealed LVEF 55 to 60%, no RWMA, unable to evaluate diastolic function, normal RV, moderately dilated LA.  Last cardiology clinic visit was 10/15/2022 with Dr. Elease Hashimoto.  His metoprolol succinate was reduced to 12.5 mg daily in the setting of sinus bradycardia. OAC changed from Eliquis to Pradaxa 150 mg BID due to cost. Normal renal function on lab work completed that day.        History of Present Illness: .   Kent [name] is a *** 82 y.o. male who is here today for 3 month follow-up.  ROS: ***       Studies Reviewed: .        *** Risk Assessment/Calculations:    CHA2DS2-VASc Score = 3  {Confirm score is correct.  If not, click here to update score.  REFRESH note.  :1} This indicates a 3.2% annual risk of stroke. The patient's score is based upon: CHF History: 0 HTN History: 0 Diabetes History: 0 Stroke History: 0 Vascular Disease History: 1 Age Score: 2 Gender Score: 0   {This patient has a significant risk of stroke if diagnosed with atrial fibrillation.  Please consider VKA or DOAC agent for anticoagulation if the bleeding risk is acceptable.   You can also use the SmartPhrase .[email or web address] for documentation.   :[id number]} No BP recorded.  {Refresh Note OR Click here to enter BP  :1}***       Physical Exam:   VS:  There were no vitals taken for  this visit.   Wt Readings from Last 3 Encounters:  10/15/22 159 lb 6.4 oz (72.3 kg)  08/26/22 157 lb 9.6 oz (71.5 kg)  07/23/22 150 lb 12.8 oz (68.4 kg)    GEN: Well nourished, well developed in no acute distress NECK: No JVD; No carotid bruits CARDIAC: ***RRR, no murmurs, rubs, gallops RESPIRATORY:  Clear to auscultation without rales, wheezing or rhonchi  ABDOMEN: Soft, non-tender, non-distended EXTREMITIES:  No edema; No deformity     ASSESSMENT AND PLAN: .    PAF on chronic anticoagulation: Sinus bradycardia:     {Are you ordering a CV Procedure (e.g. stress test, cath, DCCV, TEE, etc)?   Press F2        :[id number]}  Dispo: ***  Signed, Eligha Bridegroom, NP-C

## 2023-01-19 ENCOUNTER — Ambulatory Visit: Payer: Medicare Other | Attending: Nurse Practitioner | Admitting: Nurse Practitioner

## 2023-01-19 ENCOUNTER — Encounter: Payer: Self-pay | Admitting: Nurse Practitioner

## 2023-01-29 NOTE — Progress Notes (Deleted)
Cardiology Office Note:    Date:  01/29/2023   ID:  Kent [name], Kent [name] 20-Jun-1941, MRN [id number]  PCP:  Noberto Retort, MD   [facility] Providers Cardiologist:       Referring MD: Noberto Retort, MD   No chief complaint on file.    History of Present Illness:    Kent [name] is a 82 y.o. male with a hx of  PAF, SBO,   I saw him for a pre op evaluation in 2015 prior to hernia repair. He was recently found to have atrial fib when he presented to his primary MDs office. Was admitted  Converted back to normal  Discharged on Eliquis , metoprolol   He's concerned that he cannot afford Eliquis or Xarelto  We discussed warfarin  Will give him patient assistance for to fill out    He thinks he had a TIA ( could not speak for 45 min)  He got better  Did not go to the ER  Says he has not missed any doses      He had COVID at the time Has been seen in the afib clinic     Aug. 9, 2024 [name] is seen for follow up of his Afib  Hx of TIA     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       Cancer (HCC)    basal cell cancer - skin   Chronic back pain    s/p back surgery   GERD (gastroesophageal reflux disease) 08/18/2013   Incisional hernia, without obstruction or gangrene 02/02/2013   Postural dizziness with presyncope 09/01/2016   Sinus infection    currently on antibiotics   Small bowel obstruction due to adhesions [facility])     Past Surgical History:  Procedure Laterality Date   BACK SURGERY  08/25/2001   Neck 3 disk replacements   BACK SURGERY     Placement/removal of nerve stimulator   CHOLECYSTECTOMY N/A 10/16/2014   Procedure: LAPAROSCOPIC CHOLECYSTECTOMY WITH ATTEMPTED CHOLANGIOGRAM;  Surgeon: Violeta Gelinas, MD;  Location: [facility] OR;  Service: General;  Laterality: N/A;   COLON SURGERY  04/2002   Dr. Orson Slick - Subtotal colectomy, ileostomy   ELBOW SURGERY Right    for blood vessel & " hung" nerve   ESOPHAGEAL  DILATION     EXPLORATORY LAPAROTOMY  03/10/2017   HERNIA REPAIR     Ileostomy Takedown  08/2002   Dr. Orson Slick   INCISION AND DRAINAGE FOOT     INCISIONAL HERNIA REPAIR N/A 01/20/2013   Procedure: HERNIA REPAIR INCISIONAL;  Surgeon: Liz Malady, MD;  Location: [facility] OR;  Service: General;  Laterality: N/A;   INCISIONAL HERNIA REPAIR  04/13/2014   with mesh    dr Nelda Severe HERNIA REPAIR N/A 04/13/2014   Procedure: OPEN REPAIR INCISIONAL HERNIA ;  Surgeon: Violeta Gelinas, MD;  Location: [facility] OR;  Service: General;  Laterality: N/A;   INSERTION OF MESH N/A 04/13/2014   Procedure: INSERTION OF MESH;  Surgeon: Violeta Gelinas, MD;  Location: [facility] OR;  Service: General;  Laterality: N/A;   KNEE ARTHROSCOPY Right 2010   LAPAROSCOPIC CHOLECYSTECTOMY  10/16/2014   LAPAROTOMY N/A 01/20/2013   Procedure: EXPLORATORY LAPAROTOMY;  Surgeon: Liz Malady, MD;  Location: [facility] OR;  Service: General;  Laterality: N/A;   LAPAROTOMY N/A 03/10/2017   Procedure: EXPLORATORY LAPAROTOMY;  Surgeon: Violeta Gelinas, MD;  Location: [facility] OR;  Service:  General;  Laterality: N/A;   Laparotomy with lysis of adhesions  2010   Dr. Janee Morn - SBO   LYSIS OF ADHESION N/A 01/20/2013   Procedure: LYSIS OF ADHESION;  Surgeon: Liz Malady, MD;  Location: [facility] OR;  Service: General;  Laterality: N/A;   LYSIS OF ADHESION N/A 03/10/2017   Procedure: LYSIS OF ADHESION;  Surgeon: Violeta Gelinas, MD;  Location: [facility] OR;  Service: General;  Laterality: N/A;   microsurgical  03/24/2002   Lateral recess decompression L5   SACRAL NERVE STIMULATOR PLACEMENT     inserted and removed   SHOULDER ARTHROSCOPY Bilateral    VASECTOMY      Current Medications: No outpatient medications have been marked as taking for the 01/30/23 encounter (Appointment) with , Deloris Ping, MD.     Allergies:   Amoxicillin-pot clavulanate, Aspirin, Cephalexin, Gabapentin, Naproxen, Pregabalin, and Hydrocodone-acetaminophen   Social History    Socioeconomic History   Marital status: Married    Spouse name: Not on file   Number of children: Not on file   Years of education: Not on file   Highest education level: Not on file  Occupational History   Not on file  Tobacco Use   Smoking status: Former   Smokeless tobacco: Former    Types: Chew    Quit date: 03/05/1986  Vaping Use   Vaping status: Never Used  Substance and Sexual Activity   Alcohol use: No   Drug use: No   Sexual activity: Not on file  Other Topics Concern   Not on file  Social History Narrative   Married for 45 years, 3 granddaughters   Social Determinants of Health   Financial Resource Strain: Not on file  Food Insecurity: No Food Insecurity (07/05/2022)   Hunger Vital Sign    Worried About Running Out of Food in the Last Year: Never true    Ran Out of Food in the Last Year: Never true  Transportation Needs: No Transportation Needs (07/05/2022)   PRAPARE - Administrator, Civil Service (Medical): No    Lack of Transportation (Non-Medical): No  Physical Activity: Not on file  Stress: Not on file  Social Connections: Not on file     Family History: The patient's family history includes Brain cancer in his father; Heart disease in his mother.  ROS:   Please see the history of present illness.     All other systems reviewed and are negative.  EKGs/Labs/Other Studies Reviewed:    The following studies were reviewed today:   EKG:    Recent Labs: 07/04/2022: B Natriuretic Peptide 213.2; Hemoglobin 12.7; Magnesium 2.2; Platelets 288 07/05/2022: TSH 1.011 10/31/2022: BUN 23; Creatinine, Ser 1.27; Potassium 4.8; Sodium 135  Recent Lipid Panel No results found for: "CHOL", "TRIG", "HDL", "CHOLHDL", "VLDL", "LDLCALC", "LDLDIRECT"   Risk Assessment/Calculations:    CHA2DS2-VASc Score = 3   1} This indicates a 3.2% annual risk of stroke. The patient's score is based upon: CHF History: 0 HTN History: 0 Diabetes History: 0 Stroke  History: 0 Vascular Disease History: 1 Age Score: 2 Gender Score: 0        Physical Exam:      Physical Exam: There were no vitals taken for this visit.  No BP recorded.  {Refresh Note OR Click here to enter BP  :1}***    GEN:  Well nourished, well developed in no acute distress HEENT: Normal NECK: No JVD; No carotid bruits LYMPHATICS: No lymphadenopathy CARDIAC: RRR ***, no  murmurs, rubs, gallops RESPIRATORY:  Clear to auscultation without rales, wheezing or rhonchi  ABDOMEN: Soft, non-tender, non-distended MUSCULOSKELETAL:  No edema; No deformity  SKIN: Warm and dry NEUROLOGIC:  Alert and oriented x 3   ASSESSMENT:    No diagnosis found. PLAN:      Paroxysmal atrial fib:  he was found to have PAF.   Converted to sinus rhythm in the hospital .       2. Sinus bradycardia:       Will have him see an APP in 3 months .                Medication Adjustments/Labs and Tests Ordered: Current medicines are reviewed at length with the patient today.  Concerns regarding medicines are outlined above.  No orders of the defined types were placed in this encounter.  No orders of the defined types were placed in this encounter.   There are no Patient Instructions on file for this visit.   Signed, Kristeen Miss, MD  01/29/2023 2:25 PM    [facility]

## 2023-01-30 ENCOUNTER — Ambulatory Visit: Payer: Medicare Other | Admitting: Cardiovascular Disease

## 2023-02-19 ENCOUNTER — Other Ambulatory Visit: Payer: Self-pay | Admitting: Orthopedic Surgery

## 2023-02-19 DIAGNOSIS — M5416 Radiculopathy, lumbar region: Secondary | ICD-10-CM

## 2023-03-08 ENCOUNTER — Ambulatory Visit
Admission: RE | Admit: 2023-03-08 | Discharge: 2023-03-08 | Disposition: A | Discharge status: Home / Self Care | Payer: Medicare Other | Source: Ambulatory Visit | Attending: Orthopedic Surgery | Admitting: Orthopedic Surgery

## 2023-03-08 DIAGNOSIS — M5416 Radiculopathy, lumbar region: Secondary | ICD-10-CM

## 2023-03-08 MED ORDER — GADOPICLENOL 0.5 MMOL/ML IV SOLN
7.0000 mL | Freq: Once | INTRAVENOUS | Status: AC | PRN
  Administered 2023-03-08: 7 mL via INTRAVENOUS

## 2023-03-10 ENCOUNTER — Ambulatory Visit: Payer: Medicare Other | Admitting: Cardiovascular Disease

## 2023-03-30 ENCOUNTER — Encounter: Payer: Self-pay | Admitting: Cardiovascular Disease

## 2023-03-30 NOTE — Progress Notes (Unsigned)
Cardiology Office Note:    Date:  03/31/2023   ID:  Nelson, Noone 1940/07/21, MRN [id number]  PCP:  Noberto Retort, MD   [facility] Providers Cardiologist:  Loris Winrow     Referring MD: Noberto Retort, MD   Chief Complaint  Patient presents with   Atrial Fibrillation          History of Present Illness:    Kent [name] is a 82 y.o. male with a hx of  PAF, SBO,   I saw him for a pre op evaluation in 2015 prior to hernia repair. He was recently found to have atrial fib when he presented to his primary MDs office. Was admitted  Converted back to normal  Discharged on Eliquis , metoprolol   He's concerned that he cannot afford Eliquis or Xarelto  We discussed warfarin  Will give him patient assistance for to fill out    He thinks he had a TIA ( could not speak for 45 min)  He got better  Did not go to the ER  Says he has not missed any doses      He had COVID at the time Has been seen in the afib clinic    Oct. 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       Cancer (HCC)    basal cell cancer - skin   Chronic back pain    s/p back surgery   GERD (gastroesophageal reflux disease) 08/18/2013   Incisional hernia, without obstruction or gangrene 02/02/2013   Postural dizziness with presyncope 09/01/2016   Sinus infection    currently on antibiotics   Small bowel obstruction due to adhesions [facility])     Past Surgical History:  Procedure Laterality Date   BACK SURGERY  08/25/2001   Neck 3 disk replacements   BACK SURGERY     Placement/removal of nerve stimulator   CHOLECYSTECTOMY N/A 10/16/2014   Procedure: LAPAROSCOPIC CHOLECYSTECTOMY WITH ATTEMPTED  CHOLANGIOGRAM;  Surgeon: Violeta Gelinas, MD;  Location: [facility] OR;  Service: General;  Laterality: N/A;   COLON SURGERY  04/2002   Dr. Orson Slick - Subtotal colectomy, ileostomy   ELBOW SURGERY Right    for blood vessel & " hung" nerve   ESOPHAGEAL DILATION     EXPLORATORY LAPAROTOMY  03/10/2017   HERNIA REPAIR     Ileostomy Takedown  08/2002   Dr. Orson Slick   INCISION AND DRAINAGE FOOT     INCISIONAL HERNIA REPAIR N/A 01/20/2013   Procedure: HERNIA REPAIR INCISIONAL;  Surgeon: Liz Malady, MD;  Location: [facility] OR;  Service: General;  Laterality: N/A;   INCISIONAL HERNIA REPAIR  04/13/2014   with mesh    dr Nelda Severe HERNIA REPAIR N/A 04/13/2014   Procedure: OPEN REPAIR INCISIONAL HERNIA ;  Surgeon: Violeta Gelinas, MD;  Location[facility] OR;  Service: General;  Laterality: N/A;   INSERTION OF MESH N/A 04/13/2014   Procedure: INSERTION OF MESH;  Surgeon: Violeta Gelinas, MD;  Location: [facility] OR;  Service: General;  Laterality: N/A;   KNEE ARTHROSCOPY Right 2010  LAPAROSCOPIC CHOLECYSTECTOMY  10/16/2014   LAPAROTOMY N/A 01/20/2013   Procedure: EXPLORATORY LAPAROTOMY;  Surgeon: Liz Malady, MD;  Location: [facility] OR;  Service: General;  Laterality: N/A;   LAPAROTOMY N/A 03/10/2017   Procedure: EXPLORATORY LAPAROTOMY;  Surgeon: Violeta Gelinas, MD;  Location: [facility] OR;  Service: General;  Laterality: N/A;   Laparotomy with lysis of adhesions  2010   Dr. Janee Morn - SBO   LYSIS OF ADHESION N/A 01/20/2013   Procedure: LYSIS OF ADHESION;  Surgeon: Liz Malady, MD;  Location: [facility] OR;  Service: General;  Laterality: N/A;   LYSIS OF ADHESION N/A 03/10/2017   Procedure: LYSIS OF ADHESION;  Surgeon: Violeta Gelinas, MD;  Location: [facility] OR;  Service: General;  Laterality: N/A;   microsurgical  03/24/2002   Lateral recess decompression L5   SACRAL NERVE STIMULATOR PLACEMENT     inserted and removed   SHOULDER ARTHROSCOPY Bilateral    VASECTOMY      Current Medications: Current Meds  Medication Sig    cholestyramine (QUESTRAN) 4 g packet Take 1 packet by mouth daily.   dabigatran (PRADAXA) 150 MG CAPS capsule Take 1 capsule (150 mg total) by mouth 2 (two) times daily.   fluticasone (FLONASE) 50 MCG/ACT nasal spray Place 1 spray into both nostrils at bedtime.   loratadine-pseudoephedrine (CLARITIN-D 24 HOUR) 10-240 MG 24 hr tablet Take 1 tablet by mouth as needed.   metoprolol succinate (TOPROL XL) 25 MG 24 hr tablet Take 0.5 tablets (12.5 mg total) by mouth daily.   morphine (MS CONTIN) 30 MG 12 hr tablet Take 30 mg by mouth every 12 (twelve) hours.     Allergies:   Amoxicillin-pot clavulanate, Aspirin, Cephalexin, Gabapentin, Naproxen, Pregabalin, and Hydrocodone-acetaminophen   Social History   Socioeconomic History   Marital status: Married    Spouse name: Not on file   Number of children: Not on file   Years of education: Not on file   Highest education level: Not on file  Occupational History   Not on file  Tobacco Use   Smoking status: Former   Smokeless tobacco: Former    Types: Chew    Quit date: 03/05/1986  Vaping Use   Vaping status: Never Used  Substance and Sexual Activity   Alcohol use: No   Drug use: No   Sexual activity: Not on file  Other Topics Concern   Not on file  Social History Narrative   Married for 45 years, 3 granddaughters   Social Determinants of Health   Financial Resource Strain: Not on file  Food Insecurity: No Food Insecurity (07/05/2022)   Hunger Vital Sign    Worried About Running Out of Food in the Last Year: Never true    Ran Out of Food in the Last Year: Never true  Transportation Needs: No Transportation Needs (07/05/2022)   PRAPARE - Administrator, Civil Service (Medical): No    Lack of Transportation (Non-Medical): No  Physical Activity: Not on file  Stress: Not on file  Social Connections: Not on file     Family History: The patient's family history includes Brain cancer in his father; Heart disease in his  mother.  ROS:   Please see the history of present illness.     All other systems reviewed and are negative.  EKGs/Labs/Other Studies Reviewed:    The following studies were reviewed today:   EKG:    Recent Labs: 07/04/2022: B Natriuretic Peptide 213.2; Hemoglobin 12.7; Magnesium 2.2; Platelets 288 07/05/2022:  TSH 1.011 10/31/2022: BUN 23; Creatinine, Ser 1.27; Potassium 4.8; Sodium 135  Recent Lipid Panel No results found for: "CHOL", "TRIG", "HDL", "CHOLHDL", "VLDL", "LDLCALC", "LDLDIRECT"   Risk Assessment/Calculations:    CHA2DS2-VASc Score = 3   1} This indicates a 3.2% annual risk of stroke. The patient's score is based upon: CHF History: 0 HTN History: 0 Diabetes History: 0 Stroke History: 0 Vascular Disease History: 1 Age Score: 2 Gender Score: 0          Physical Exam:    Physical Exam: Blood pressure (!) 90/45, pulse 70, height 5\' 10"  (1.778 m), weight 162 lb 12.8 oz (73.8 kg), SpO2 98%.       GEN:  Well nourished, well developed in no acute distress HEENT: Normal NECK: No JVD; No carotid bruits LYMPHATICS: No lymphadenopathy CARDIAC: RRR , no murmurs, rubs, gallops RESPIRATORY:  Clear to auscultation without rales, wheezing or rhonchi  ABDOMEN: Soft, non-tender, non-distended MUSCULOSKELETAL:  No edema; No deformity  SKIN: Warm and dry NEUROLOGIC:  Alert and oriented x 3   ASSESSMENT:    1. Paroxysmal atrial fibrillation (HCC)    PLAN:      Paroxysmal atrial fib:    He is at low risk for his upcoming hemorrhoid surgery.  He is currently on Pradaxa.  He may hold his Pradaxa for 2 to 3 days prior to the hemorrhoid surgery.  He should restart the Pradaxa as soon as it is safe from a surgical standpoint.   2. Sinus bradycardia:      3.  Hypotension: He is not on any medications from my standpoint that would lower his blood pressure.  I have encouraged him to eat and drink regularly.             Medication Adjustments/Labs and Tests  Ordered: Current medicines are reviewed at length with the patient today.  Concerns regarding medicines are outlined above.  No orders of the defined types were placed in this encounter.  No orders of the defined types were placed in this encounter.   Patient Instructions  Medication Instructions:  Your physician recommends that you continue on your current medications as directed. Please refer to the Current Medication list given to you today.  *If you need a refill on your cardiac medications before your next appointment, please call your pharmacy*   Lab Work: NONE If you have labs (blood work) drawn today and your tests are completely normal, you will receive your results only by: MyChart Message (if you have MyChart) OR A paper copy in the mail If you have any lab test that is abnormal or we need to change your treatment, we will call you to review the results.  Testing/Procedures: **Cleared for upcoming hemorrhoid surgery**  Follow-Up: At [facility], you and your health needs are our priority.  As part of our continuing mission to provide you with exceptional heart care, we have created designated Provider Care Teams.  These Care Teams include your primary Cardiologist (physician) and Advanced Practice Providers (APPs -  Physician Assistants and Nurse Practitioners) who all work together to provide you with the care you need, when you need it.  Your next appointment:   1 year(s)  Provider:   Kristeen Miss, MD     Signed, Kristeen Miss, MD  03/31/2023 4:33 PM    [facility]

## 2023-03-31 ENCOUNTER — Ambulatory Visit: Payer: Medicare Other | Attending: Nurse Practitioner | Admitting: Cardiovascular Disease

## 2023-03-31 ENCOUNTER — Encounter: Payer: Self-pay | Admitting: Cardiovascular Disease

## 2023-03-31 VITALS — BP 90/45 | HR 70 | Ht 70.0 in | Wt 162.8 lb

## 2023-03-31 DIAGNOSIS — I480 Paroxysmal atrial fibrillation: Secondary | ICD-10-CM | POA: Diagnosis present

## 2023-03-31 NOTE — Patient Instructions (Signed)
Medication Instructions:  Your physician recommends that you continue on your current medications as directed. Please refer to the Current Medication list given to you today.  *If you need a refill on your cardiac medications before your next appointment, please call your pharmacy*   Lab Work: NONE If you have labs (blood work) drawn today and your tests are completely normal, you will receive your results only by: MyChart Message (if you have MyChart) OR A paper copy in the mail If you have any lab test that is abnormal or we need to change your treatment, we will call you to review the results.  Testing/Procedures: **Cleared for upcoming hemorrhoid surgery**  Follow-Up: At [facility], you and your health needs are our priority.  As part of our continuing mission to provide you with exceptional heart care, we have created designated Provider Care Teams.  These Care Teams include your primary Cardiologist (physician) and Advanced Practice Providers (APPs -  Physician Assistants and Nurse Practitioners) who all work together to provide you with the care you need, when you need it.  Your next appointment:   1 year(s)  Provider:   Kristeen Miss, MD

## 2023-04-22 ENCOUNTER — Telehealth: Payer: Self-pay | Admitting: *Deleted

## 2023-04-22 NOTE — Telephone Encounter (Signed)
   Pre-operative Risk Assessment    Patient Name: Kent [name]  DOB: 1940-06-27 MRN: [id number]  DATE OF LAST VISIT: 03/31/23 DR. NAHSER DATE OF NEXT VISIT: NONE    Request for Surgical Clearance    Procedure:   HEMORRHOIDECTOMY   Date of Surgery:  Clearance TBD                                 Surgeon:  DR. Cristal Deer WHITE Surgeon's Group or Practice Name:  [facility] Phone number:  [phone] Fax number:  [phone] ATTN: Doristine Devoid, CMA   Type of Clearance Requested:   - Medical ; NONE LISTED TO BE HELD   Type of Anesthesia:  General    Additional requests/questions:    [name]   04/22/2023, 6:02 PM

## 2023-04-23 NOTE — Telephone Encounter (Signed)
   Name: Kent [name]  DOB: 11/16/1940  MRN: [id number]   Primary Cardiologist: Kristeen Miss, MD  Chart reviewed as part of pre-operative protocol coverage. HASKER DUNSTON was last seen on 03/31/2023 by Dr. Elease Hashimoto.  Per office visit note "Needs to hemorrhoid surgery. He is at low risk for the surgery. OK to hold Pradaxa for 2-3 days prior to his surgery."   I will route this recommendation to the requesting party via Epic fax function and remove from pre-op pool. Please call with questions.  Carlos Levering, NP 04/23/2023, 1:15 PM

## 2023-05-01 ENCOUNTER — Other Ambulatory Visit (HOSPITAL_COMMUNITY): Payer: Self-pay

## 2023-05-01 NOTE — Telephone Encounter (Signed)
Patient given samples of Eliquis 5 mg tablet Date: 09/30/2022 Prescriber: Fredia Beets Lot # [id number] Exp: 03/22/2024 Quantity: 56 Manufacturer: [id number]/BMS

## 2023-05-05 ENCOUNTER — Ambulatory Visit: Payer: Self-pay | Admitting: Surgery

## 2023-05-05 DIAGNOSIS — Z01818 Encounter for other preprocedural examination: Secondary | ICD-10-CM

## 2023-05-05 NOTE — Progress Notes (Signed)
Sent message, via epic in basket, requesting orders in epic from surgeon.  

## 2023-05-08 NOTE — Patient Instructions (Signed)
DUE TO COVID-19 ONLY TWO VISITORS  (aged 80 and older)  ARE ALLOWED TO COME WITH YOU AND STAY IN THE WAITING ROOM ONLY DURING PRE OP AND PROCEDURE.   **NO VISITORS ARE ALLOWED IN THE SHORT STAY AREA OR RECOVERY ROOM!!**  IF YOU WILL BE ADMITTED INTO THE HOSPITAL YOU ARE ALLOWED ONLY FOUR SUPPORT PEOPLE DURING VISITATION HOURS ONLY (7 AM -8PM)   The support person(s) must pass our screening, gel in and out, and wear a mask at all times, including in the patient's room. Patients must also wear a mask when staff or their support person are in the room. Visitors GUEST BADGE MUST BE WORN VISIBLY  One adult visitor may remain with you overnight and MUST be in the room by 8 P.M.     Your procedure is scheduled on: 05/25/23   Report to [facility] Main Entrance    Report to admitting at : 8:45 AM   Call this number if you have problems the morning of surgery [phone]   Do not eat food :After Midnight.   After Midnight you may have the following liquids until : 8:00 AM DAY OF SURGERY  Water Black Coffee (sugar ok, NO MILK/CREAM OR CREAMERS)  Tea (sugar ok, NO MILK/CREAM OR CREAMERS) regular and decaf                             Plain Jell-O (NO RED)                                           Fruit ices (not with fruit pulp, NO RED)                                     Popsicles (NO RED)                                                                  Juice: apple, WHITE grape, WHITE cranberry Sports drinks like Gatorade (NO RED)              FOLLOW BOWEL PREP AND ANY ADDITIONAL PRE OP INSTRUCTIONS YOU RECEIVED FROM YOUR SURGEON'S OFFICE!!!   Oral Hygiene is also important to reduce your risk of infection.                                    Remember - BRUSH YOUR TEETH THE MORNING OF SURGERY WITH YOUR REGULAR TOOTHPASTE  DENTURES WILL BE REMOVED PRIOR TO SURGERY PLEASE DO NOT APPLY "Poly grip" OR ADHESIVES!!!   Do NOT smoke after Midnight   Take these medicines the morning of  surgery with A SIP OF WATER: metoprolol.Loratadine as needed.                              You may not have any metal on your body including hair pins, jewelry, and body piercing  Do not wear lotions, powders, perfumes/cologne, or deodorant              Men may shave face and neck.   Do not bring valuables to the hospital. [place] IS NOT             RESPONSIBLE   FOR VALUABLES.   Contacts, glasses, or bridgework may not be worn into surgery.   Bring small overnight bag day of surgery.   DO NOT BRING YOUR HOME MEDICATIONS TO THE HOSPITAL. PHARMACY WILL DISPENSE MEDICATIONS LISTED ON YOUR MEDICATION LIST TO YOU DURING YOUR ADMISSION IN THE HOSPITAL!    Patients discharged on the day of surgery will not be allowed to drive home.  Someone NEEDS to stay with you for the first 24 hours after anesthesia.   Special Instructions: Bring a copy of your healthcare power of attorney and living will documents         the day of surgery if you haven't scanned them before.              Please read over the following fact sheets you were given: IF YOU HAVE QUESTIONS ABOUT YOUR PRE-OP INSTRUCTIONS PLEASE CALL [phone]    These are anesthesia recommendations for holding your anticoagulants.  Please contact your prescribing physician to confirm IF it is safe to hold your anticoagulants for this length of time.   Eliquis Apixaban   72 hours   Xarelto Rivaroxaban   72 hours  Plavix Clopidogrel   120 hours  Pletal Cilostazol   120 hours   [place] - Preparing for Surgery Before surgery, you can play an important role.  Because skin is not sterile, your skin needs to be as free of germs as possible.  You can reduce the number of germs on your skin by washing with CHG (chlorahexidine gluconate) soap before surgery.  CHG is an antiseptic cleaner which kills germs and bonds with the skin to continue killing germs even after washing. Please DO NOT use if you have an allergy to CHG or  antibacterial soaps.  If your skin becomes reddened/irritated stop using the CHG and inform your nurse when you arrive at Short Stay. Do not shave (including legs and underarms) for at least 48 hours prior to the first CHG shower.  You may shave your face/neck. Please follow these instructions carefully:  1.  Shower with CHG Soap the night before surgery and the  morning of Surgery.  2.  If you choose to wash your hair, wash your hair first as usual with your  normal  shampoo.  3.  After you shampoo, rinse your hair and body thoroughly to remove the  shampoo.                           4.  Use CHG as you would any other liquid soap.  You can apply chg directly  to the skin and wash                       Gently with a scrungie or clean washcloth.  5.  Apply the CHG Soap to your body ONLY FROM THE NECK DOWN.   Do not use on face/ open                           Wound or open sores. Avoid contact with eyes, ears mouth and  genitals (private parts).                       Wash face,  Genitals (private parts) with your normal soap.             6.  Wash thoroughly, paying special attention to the area where your surgery  will be performed.  7.  Thoroughly rinse your body with warm water from the neck down.  8.  DO NOT shower/wash with your normal soap after using and rinsing off  the CHG Soap.                9.  Pat yourself dry with a clean towel.            10.  Wear clean pajamas.            11.  Place clean sheets on your bed the night of your first shower and do not  sleep with pets. Day of Surgery : Do not apply any lotions/deodorants the morning of surgery.  Please wear clean clothes to the hospital/surgery center.  FAILURE TO FOLLOW THESE INSTRUCTIONS MAY RESULT IN THE CANCELLATION OF YOUR SURGERY PATIENT SIGNATURE_________________________________  NURSE SIGNATURE__________________________________  ________________________________________________________________________

## 2023-05-12 ENCOUNTER — Other Ambulatory Visit: Payer: Self-pay

## 2023-05-12 ENCOUNTER — Encounter (HOSPITAL_COMMUNITY): Payer: Self-pay

## 2023-05-12 ENCOUNTER — Encounter (HOSPITAL_COMMUNITY)
Admission: RE | Admit: 2023-05-12 | Discharge: 2023-05-12 | Disposition: A | Discharge status: Home / Self Care | Payer: Medicare Other | Source: Ambulatory Visit | Attending: Surgery

## 2023-05-12 DIAGNOSIS — I4891 Unspecified atrial fibrillation: Secondary | ICD-10-CM | POA: Diagnosis not present

## 2023-05-12 DIAGNOSIS — K649 Unspecified hemorrhoids: Secondary | ICD-10-CM | POA: Diagnosis not present

## 2023-05-12 DIAGNOSIS — Z7901 Long term (current) use of anticoagulants: Secondary | ICD-10-CM | POA: Diagnosis not present

## 2023-05-12 DIAGNOSIS — Z87891 Personal history of nicotine dependence: Secondary | ICD-10-CM | POA: Diagnosis not present

## 2023-05-12 DIAGNOSIS — Z01818 Encounter for other preprocedural examination: Secondary | ICD-10-CM | POA: Insufficient documentation

## 2023-05-12 HISTORY — DX: Cardiac arrhythmia, unspecified: I49.9

## 2023-05-12 LAB — CBC WITH DIFFERENTIAL/PLATELET
Abs Immature Granulocytes: 0.04 10*3/uL (ref 0.00–0.07)
Basophils Absolute: 0 10*3/uL (ref 0.0–0.1)
Basophils Relative: 1 %
Eosinophils Absolute: 0.1 10*3/uL (ref 0.0–0.5)
Eosinophils Relative: 1 %
HCT: 26.8 % — ABNORMAL LOW (ref 39.0–52.0)
Hemoglobin: 9.4 g/dL — ABNORMAL LOW (ref 13.0–17.0)
Immature Granulocytes: 1 %
Lymphocytes Relative: 19 %
Lymphs Abs: 0.8 10*3/uL (ref 0.7–4.0)
MCH: 31.3 pg (ref 26.0–34.0)
MCHC: 35.1 g/dL (ref 30.0–36.0)
MCV: 89.3 fL (ref 80.0–100.0)
Monocytes Absolute: 0.4 10*3/uL (ref 0.1–1.0)
Monocytes Relative: 8 %
Neutro Abs: 3.1 10*3/uL (ref 1.7–7.7)
Neutrophils Relative %: 70 %
Platelets: 416 10*3/uL — ABNORMAL HIGH (ref 150–400)
RBC: 3 MIL/uL — ABNORMAL LOW (ref 4.22–5.81)
RDW: 13.9 % (ref 11.5–15.5)
WBC: 4.4 10*3/uL (ref 4.0–10.5)
nRBC: 0.5 % — ABNORMAL HIGH (ref 0.0–0.2)

## 2023-05-12 LAB — BASIC METABOLIC PANEL
Anion gap: 9 (ref 5–15)
BUN: 20 mg/dL (ref 8–23)
CO2: 23 mmol/L (ref 22–32)
Calcium: 9.4 mg/dL (ref 8.9–10.3)
Chloride: 105 mmol/L (ref 98–111)
Creatinine, Ser: 1.48 mg/dL — ABNORMAL HIGH (ref 0.61–1.24)
GFR, Estimated: 48 mL/min — ABNORMAL LOW (ref >60)
Glucose, Bld: 91 mg/dL (ref 70–99)
Potassium: 4.4 mmol/L (ref 3.5–5.1)
Sodium: 137 mmol/L (ref 135–145)

## 2023-05-12 NOTE — Progress Notes (Signed)
For Anesthesia: PCP - Noberto Retort, MD  Cardiologist - Nahser, Deloris Ping, MD  Clearance: Kent [name]: NP: 04/23/23 Bowel Prep reminder:  Chest x-ray - 07/04/22. CT Chest: 07/05/22 EKG - 08/26/22 Stress Test -  ECHO - 07/05/22 Cardiac Cath -  Pacemaker/ICD device last checked: Pacemaker orders received: Device Rep notified:  Spinal Cord Stimulator: N/A  Sleep Study - N/A CPAP -   Fasting Blood Sugar -  Checks Blood Sugar _____ times a day Date and result of last Hgb A1c-  Last dose of GLP1 agonist- N/A GLP1 instructions:   Last dose of SGLT-2 inhibitors- N/A SGLT-2 instructions:   Blood Thinner Instructions: Pt. Did not get instructions about when to hold pradaxa,he will call surgeon's office to find out. Aspirin Instructions: Last Dose:  Activity level: Can go up a flight of stairs and activities of daily living without stopping and without chest pain and/or shortness of breath   Able to exercise without chest pain and/or shortness of breath  Anesthesia review: Hx: Afib.  Patient denies shortness of breath, fever, cough and chest pain at PAT appointment   Patient verbalized understanding of instructions that were given to them at the PAT appointment. Patient was also instructed that they will need to review over the PAT instructions again at home before surgery.

## 2023-05-12 NOTE — Progress Notes (Signed)
Lab. Results: Hemoglobin: 9.4 HCT: 26.8

## 2023-05-13 ENCOUNTER — Encounter (HOSPITAL_COMMUNITY): Payer: Self-pay

## 2023-05-13 NOTE — Progress Notes (Signed)
Case: [id number] Date/Time: 05/25/23 1045   Procedures:      HEMORRHOIDECTOMY INTERNAL     ANORECTAL EXAM UNDER ANESTHESIA   Anesthesia type: General   Pre-op diagnosis: HEMORRHOIDS   Location: [facility] ROOM 01 / [facility]   Surgeons: Andria Meuse, MD       DISCUSSION: Kent [name] is an 82 yo male who presents to PAT prior to surgery above. PMH significant for former smoking, A-fib on Pradaxa, possible TIA in March 2024, GERD, multiple bowel surgeries and SBO's, internal hemorrhoids with bleeding and acute blood loss anemia.  Patient follows with cardiology due to history of A-fib and is on Pradaxa for stroke prevention.  Patient reported to provider that on March 16 he had 45 minute episode of aphasia.  Symptoms completely resolved after that.  He thinks he may have had a TIA.  He was never evaluated in the emergency department.  Last seen in clinic on 03/31/2023.  Noted to be doing well at that time.  Cleared for upcoming surgery:  "Needs to hemorrhoid surgery  He is at low risk for the surgery  OK to hold Pradaxa for 2-3 days prior to his surgery"  VS: BP 130/64   Pulse 84   Temp 36.8 C (Oral)   Ht 5\' 10"  (1.778 m)   Wt 68.9 kg   SpO2 100%   BMI 21.81 kg/m   PROVIDERS: PCP - Noberto Retort, MD  Cardiologist - Nahser, Deloris Ping, MD    LABS: Labs reviewed: Acceptable for surgery.  Kidney function slightly above baseline but appears to have some degree of CKD. (all labs ordered are listed, but only abnormal results are displayed)  Labs Reviewed  CBC WITH DIFFERENTIAL/PLATELET - Abnormal; Notable for the following components:      Result Value   RBC 3.00 (*)    Hemoglobin 9.4 (*)    HCT 26.8 (*)    Platelets 416 (*)    nRBC 0.5 (*)    All other components within normal limits  BASIC METABOLIC PANEL - Abnormal; Notable for the following components:   Creatinine, Ser 1.48 (*)    GFR, Estimated 48 (*)    All other components within normal limits      IMAGES:   EKG:   CV: Echo 07/05/2022: IMPRESSIONS     1. Left ventricular ejection fraction, by estimation, is 55 to 60%. The  left ventricle has normal function. The left ventricle has no regional  wall motion abnormalities. Left ventricular diastolic function could not  be evaluated.   2. Right ventricular systolic function is normal. The right ventricular  size is normal.   3. Left atrial size was mild to moderately dilated.   4. The mitral valve is normal in structure. Trivial mitral valve  regurgitation. No evidence of mitral stenosis.   5. The aortic valve is tricuspid. Aortic valve regurgitation is not  visualized. Aortic valve sclerosis is present, with no evidence of aortic  valve stenosis.   6. The inferior vena cava is normal in size with greater than 50%  respiratory variability, suggesting right atrial pressure of 3 mmHg.   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       Cancer (HCC)    basal cell cancer - skin   Chronic back pain    s/p back surgery   Dysrhythmia    GERD (gastroesophageal reflux disease) 08/18/2013   Incisional hernia, without obstruction or gangrene 02/02/2013  Postural dizziness with presyncope 09/01/2016   Sinus infection    currently on antibiotics   Small bowel obstruction due to adhesions [facility])     Past Surgical History:  Procedure Laterality Date   BACK SURGERY  08/25/2001   Neck 3 disk replacements   BACK SURGERY     Placement/removal of nerve stimulator   CHOLECYSTECTOMY N/A 10/16/2014   Procedure: LAPAROSCOPIC CHOLECYSTECTOMY WITH ATTEMPTED CHOLANGIOGRAM;  Surgeon: Violeta Gelinas, MD;  Location: [facility] OR;  Service: General;  Laterality: N/A;   COLON SURGERY  04/2002   Dr. Orson Slick - Subtotal colectomy, ileostomy   ELBOW SURGERY Right    for blood vessel & " hung" nerve   ESOPHAGEAL DILATION     EXPLORATORY LAPAROTOMY  03/10/2017   HERNIA REPAIR     Ileostomy Takedown  08/2002   Dr. Orson Slick    INCISION AND DRAINAGE FOOT     INCISIONAL HERNIA REPAIR N/A 01/20/2013   Procedure: HERNIA REPAIR INCISIONAL;  Surgeon: Liz Malady, MD;  Location: [facility] OR;  Service: General;  Laterality: N/A;   INCISIONAL HERNIA REPAIR  04/13/2014   with mesh    dr Nelda Severe HERNIA REPAIR N/A 04/13/2014   Procedure: OPEN REPAIR INCISIONAL HERNIA ;  Surgeon: Violeta Gelinas, MD;  Location: [facility] OR;  Service: General;  Laterality: N/A;   INSERTION OF MESH N/A 04/13/2014   Procedure: INSERTION OF MESH;  Surgeon: Violeta Gelinas, MD;  Location: [facility] OR;  Service: General;  Laterality: N/A;   KNEE ARTHROSCOPY Right 2010   LAPAROSCOPIC CHOLECYSTECTOMY  10/16/2014   LAPAROTOMY N/A 01/20/2013   Procedure: EXPLORATORY LAPAROTOMY;  Surgeon: Liz Malady, MD;  Location[facility] OR;  Service: General;  Laterality: N/A;   LAPAROTOMY N/A 03/10/2017   Procedure: EXPLORATORY LAPAROTOMY;  Surgeon: Violeta Gelinas, MD;  Location: [facility] OR;  Service: General;  Laterality: N/A;   Laparotomy with lysis of adhesions  2010   Dr. Janee Morn - SBO   LYSIS OF ADHESION N/A 01/20/2013   Procedure: LYSIS OF ADHESION;  Surgeon: Liz Malady, MD;  Location: [facility] OR;  Service: General;  Laterality: N/A;   LYSIS OF ADHESION N/A 03/10/2017   Procedure: LYSIS OF ADHESION;  Surgeon: Violeta Gelinas, MD;  Location: [facility] OR;  Service: General;  Laterality: N/A;   microsurgical  03/24/2002   Lateral recess decompression L5   SACRAL NERVE STIMULATOR PLACEMENT     inserted and removed   SHOULDER ARTHROSCOPY Bilateral    VASECTOMY      MEDICATIONS:  dabigatran (PRADAXA) 150 MG CAPS capsule   loratadine-pseudoephedrine (CLARITIN-D 24 HOUR) 10-240 MG 24 hr tablet   metoprolol succinate (TOPROL XL) 25 MG 24 hr tablet   morphine (MS CONTIN) 30 MG 12 hr tablet   No current facility-administered medications for this encounter.   Marcille Blanco [facility] Short Stay/Anesthesiology [facility] Phone [phone] 05/13/2023 1:02  PM

## 2023-05-13 NOTE — Anesthesia Preprocedure Evaluation (Addendum)
Anesthesia Evaluation  Patient identified by MRN, date of birth, ID band Patient awake    Reviewed: Allergy & Precautions, H&P , NPO status , Patient's Chart, lab work & pertinent test results  Airway Mallampati: II   Neck ROM: full    Dental   Pulmonary former smoker   breath sounds clear to auscultation       Cardiovascular + dysrhythmias Atrial Fibrillation  Rhythm:regular Rate:Normal     Neuro/Psych    GI/Hepatic ,GERD  ,,  Endo/Other    Renal/GU      Musculoskeletal  (+) Arthritis ,    Abdominal   Peds  Hematology   Anesthesia Other Findings   Reproductive/Obstetrics                             Anesthesia Physical Anesthesia Plan  ASA: 3  Anesthesia Plan: General   Post-op Pain Management:    Induction: Intravenous  PONV Risk Score and Plan: 2 and Ondansetron, Dexamethasone and Treatment may vary due to age or medical condition  Airway Management Planned: Oral ETT  Additional Equipment:   Intra-op Plan:   Post-operative Plan: Extubation in OR  Informed Consent: I have reviewed the patients History and Physical, chart, labs and discussed the procedure including the risks, benefits and alternatives for the proposed anesthesia with the patient or authorized representative who has indicated his/her understanding and acceptance.     Dental advisory given  Plan Discussed with: CRNA, Anesthesiologist and Surgeon  Anesthesia Plan Comments: (See PAT note from 11/19 by Sherlie Ban PA-C )        Anesthesia Quick Evaluation

## 2023-05-25 ENCOUNTER — Encounter (HOSPITAL_COMMUNITY)
Admission: RE | Disposition: A | Discharge status: Home / Self Care | Payer: Self-pay | Source: Home / Self Care | Attending: Surgery

## 2023-05-25 ENCOUNTER — Encounter (HOSPITAL_COMMUNITY): Payer: Self-pay | Admitting: Surgery

## 2023-05-25 ENCOUNTER — Ambulatory Visit (HOSPITAL_BASED_OUTPATIENT_CLINIC_OR_DEPARTMENT_OTHER): Payer: Medicare Other | Admitting: Anesthesiology

## 2023-05-25 ENCOUNTER — Ambulatory Visit (HOSPITAL_COMMUNITY)
Admission: RE | Admit: 2023-05-25 | Discharge: 2023-05-25 | Disposition: A | Discharge status: Home / Self Care | Payer: Medicare Other | Attending: Surgery | Admitting: Surgery

## 2023-05-25 ENCOUNTER — Ambulatory Visit (HOSPITAL_COMMUNITY): Payer: Medicare Other | Admitting: Medical

## 2023-05-25 DIAGNOSIS — Z7901 Long term (current) use of anticoagulants: Secondary | ICD-10-CM | POA: Diagnosis not present

## 2023-05-25 DIAGNOSIS — K644 Residual hemorrhoidal skin tags: Secondary | ICD-10-CM | POA: Insufficient documentation

## 2023-05-25 DIAGNOSIS — Z9049 Acquired absence of other specified parts of digestive tract: Secondary | ICD-10-CM | POA: Diagnosis not present

## 2023-05-25 DIAGNOSIS — Z87891 Personal history of nicotine dependence: Secondary | ICD-10-CM | POA: Diagnosis not present

## 2023-05-25 DIAGNOSIS — I700 Atherosclerosis of aorta: Secondary | ICD-10-CM | POA: Diagnosis not present

## 2023-05-25 DIAGNOSIS — K642 Third degree hemorrhoids: Secondary | ICD-10-CM | POA: Diagnosis present

## 2023-05-25 DIAGNOSIS — M1990 Unspecified osteoarthritis, unspecified site: Secondary | ICD-10-CM | POA: Insufficient documentation

## 2023-05-25 DIAGNOSIS — K219 Gastro-esophageal reflux disease without esophagitis: Secondary | ICD-10-CM | POA: Insufficient documentation

## 2023-05-25 DIAGNOSIS — K649 Unspecified hemorrhoids: Secondary | ICD-10-CM | POA: Diagnosis not present

## 2023-05-25 DIAGNOSIS — I480 Paroxysmal atrial fibrillation: Secondary | ICD-10-CM | POA: Diagnosis not present

## 2023-05-25 HISTORY — PX: HEMORRHOID SURGERY: SHX153

## 2023-05-25 SURGERY — HEMORRHOIDECTOMY
Anesthesia: General | Site: Rectum

## 2023-05-25 MED ORDER — ONDANSETRON HCL 4 MG/2ML IJ SOLN: 4.0000 mg | Freq: Four times a day (QID) | INTRAMUSCULAR | Status: DC | PRN

## 2023-05-25 MED ORDER — PHENYLEPHRINE HCL-NACL 20-0.9 MG/250ML-% IV SOLN
INTRAVENOUS | Status: DC | PRN
  Administered 2023-05-25: 60 ug/min via INTRAVENOUS

## 2023-05-25 MED ORDER — LIDOCAINE HCL (PF) 2 % IJ SOLN
INTRAMUSCULAR | Status: AC
  Filled 2023-05-25: qty 5

## 2023-05-25 MED ORDER — DEXAMETHASONE SODIUM PHOSPHATE 10 MG/ML IJ SOLN
INTRAMUSCULAR | Status: AC
Start: 2023-05-25 — End: ?
  Filled 2023-05-25: qty 1

## 2023-05-25 MED ORDER — PROPOFOL 10 MG/ML IV BOLUS
INTRAVENOUS | Status: DC | PRN
  Administered 2023-05-25: 20 mg via INTRAVENOUS
  Administered 2023-05-25: 100 mg via INTRAVENOUS
  Administered 2023-05-25: 20 mg via INTRAVENOUS

## 2023-05-25 MED ORDER — FENTANYL CITRATE (PF) 100 MCG/2ML IJ SOLN
INTRAMUSCULAR | Status: AC
  Filled 2023-05-25: qty 2

## 2023-05-25 MED ORDER — BUPIVACAINE-EPINEPHRINE 0.25% -1:200000 IJ SOLN
INTRAMUSCULAR | Status: AC
  Filled 2023-05-25: qty 1

## 2023-05-25 MED ORDER — CHLORHEXIDINE GLUCONATE 0.12 % MT SOLN
15.0000 mL | Freq: Once | OROMUCOSAL | Status: AC
  Administered 2023-05-25: 15 mL via OROMUCOSAL

## 2023-05-25 MED ORDER — LIDOCAINE HCL (CARDIAC) PF 100 MG/5ML IV SOSY
PREFILLED_SYRINGE | INTRAVENOUS | Status: DC | PRN
  Administered 2023-05-25: 50 mg via INTRAVENOUS

## 2023-05-25 MED ORDER — FENTANYL CITRATE PF 50 MCG/ML IJ SOSY: 25.0000 ug | PREFILLED_SYRINGE | INTRAMUSCULAR | Status: DC | PRN

## 2023-05-25 MED ORDER — CHLORHEXIDINE GLUCONATE CLOTH 2 % EX PADS: 6.0000 | MEDICATED_PAD | Freq: Once | CUTANEOUS | Status: DC

## 2023-05-25 MED ORDER — MIDAZOLAM HCL 2 MG/2ML IJ SOLN
INTRAMUSCULAR | Status: AC
Start: 2023-05-25 — End: ?
  Filled 2023-05-25: qty 2

## 2023-05-25 MED ORDER — SUGAMMADEX SODIUM 200 MG/2ML IV SOLN
INTRAVENOUS | Status: DC | PRN
  Administered 2023-05-25: 200 mg via INTRAVENOUS

## 2023-05-25 MED ORDER — ACETAMINOPHEN 500 MG PO TABS
1000.0000 mg | ORAL_TABLET | ORAL | Status: DC
  Filled 2023-05-25: qty 2

## 2023-05-25 MED ORDER — ORAL CARE MOUTH RINSE: 15.0000 mL | Freq: Once | OROMUCOSAL | Status: AC

## 2023-05-25 MED ORDER — OXYCODONE HCL 5 MG/5ML PO SOLN
5.0000 mg | Freq: Once | ORAL | Status: DC | PRN
Start: 2023-05-25 — End: 2023-05-25

## 2023-05-25 MED ORDER — OXYCODONE HCL 5 MG PO TABS: 5.0000 mg | ORAL_TABLET | Freq: Once | ORAL | Status: DC | PRN

## 2023-05-25 MED ORDER — BUPIVACAINE LIPOSOME 1.3 % IJ SUSP
INTRAMUSCULAR | Status: DC | PRN
  Administered 2023-05-25: 20 mL

## 2023-05-25 MED ORDER — BUPIVACAINE-EPINEPHRINE (PF) 0.25% -1:200000 IJ SOLN
INTRAMUSCULAR | Status: DC | PRN
  Administered 2023-05-25: 30 mL

## 2023-05-25 MED ORDER — DIBUCAINE (PERIANAL) 1 % EX OINT
TOPICAL_OINTMENT | CUTANEOUS | Status: AC
Start: 2023-05-25 — End: ?
  Filled 2023-05-25: qty 28

## 2023-05-25 MED ORDER — ROCURONIUM BROMIDE 100 MG/10ML IV SOLN
INTRAVENOUS | Status: DC | PRN
  Administered 2023-05-25: 50 mg via INTRAVENOUS

## 2023-05-25 MED ORDER — CHLORHEXIDINE GLUCONATE CLOTH 2 % EX PADS
6.0000 | MEDICATED_PAD | Freq: Once | CUTANEOUS | Status: DC
Start: 2023-05-25 — End: 2023-05-25

## 2023-05-25 MED ORDER — DEXAMETHASONE SODIUM PHOSPHATE 10 MG/ML IJ SOLN
INTRAMUSCULAR | Status: DC | PRN
  Administered 2023-05-25: 5 mg via INTRAVENOUS

## 2023-05-25 MED ORDER — PHENYLEPHRINE HCL (PRESSORS) 10 MG/ML IV SOLN
INTRAVENOUS | Status: DC | PRN
  Administered 2023-05-25 (×2): 160 ug via INTRAVENOUS

## 2023-05-25 MED ORDER — BUPIVACAINE LIPOSOME 1.3 % IJ SUSP: 20.0000 mL | Freq: Once | INTRAMUSCULAR | Status: DC

## 2023-05-25 MED ORDER — ONDANSETRON HCL 4 MG/2ML IJ SOLN
INTRAMUSCULAR | Status: DC | PRN
  Administered 2023-05-25: 4 mg via INTRAVENOUS

## 2023-05-25 MED ORDER — PROPOFOL 10 MG/ML IV BOLUS
INTRAVENOUS | Status: AC
Start: 2023-05-25 — End: ?
  Filled 2023-05-25: qty 20

## 2023-05-25 MED ORDER — ONDANSETRON HCL 4 MG/2ML IJ SOLN
INTRAMUSCULAR | Status: AC
  Filled 2023-05-25: qty 2

## 2023-05-25 MED ORDER — LACTATED RINGERS IV SOLN: INTRAVENOUS | Status: DC

## 2023-05-25 MED ORDER — BUPIVACAINE LIPOSOME 1.3 % IJ SUSP
INTRAMUSCULAR | Status: AC
Start: 2023-05-25 — End: ?
  Filled 2023-05-25: qty 20

## 2023-05-25 MED ORDER — FENTANYL CITRATE (PF) 100 MCG/2ML IJ SOLN
INTRAMUSCULAR | Status: DC | PRN
  Administered 2023-05-25 (×2): 25 ug via INTRAVENOUS
  Administered 2023-05-25: 50 ug via INTRAVENOUS

## 2023-05-25 SURGICAL SUPPLY — 39 items
BAG COUNTER SPONGE SURGICOUNT (BAG) IMPLANT
BENZOIN TINCTURE PRP APPL 2/3 (GAUZE/BANDAGES/DRESSINGS) ×1 IMPLANT
BLADE SURG 15 STRL LF DISP TIS (BLADE) IMPLANT
BRIEF MESH DISP LRG (UNDERPADS AND DIAPERS) ×1 IMPLANT
CNTNR URN SCR LID CUP LEK RST (MISCELLANEOUS) ×1 IMPLANT
COVER SURGICAL LIGHT HANDLE (MISCELLANEOUS) ×1 IMPLANT
DRAPE LAPAROTOMY T 102X78X121 (DRAPES) ×1 IMPLANT
ELECT NDL BLADE 2-5/6 (NEEDLE) ×1 IMPLANT
ELECT NEEDLE BLADE 2-5/6 (NEEDLE) ×1
ELECT REM PT RETURN 15FT ADLT (MISCELLANEOUS) ×1 IMPLANT
GAUZE 4X4 16PLY ~~LOC~~+RFID DBL (SPONGE) ×1 IMPLANT
GAUZE PAD ABD 8X10 STRL (GAUZE/BANDAGES/DRESSINGS) IMPLANT
GAUZE SPONGE 4X4 12PLY STRL (GAUZE/BANDAGES/DRESSINGS) IMPLANT
GLOVE BIO SURGEON STRL SZ7.5 (GLOVE) ×1 IMPLANT
GLOVE INDICATOR 8.0 STRL GRN (GLOVE) ×1 IMPLANT
GOWN STRL REUS W/ TWL XL LVL3 (GOWN DISPOSABLE) ×2 IMPLANT
KIT BASIN OR (CUSTOM PROCEDURE TRAY) ×1 IMPLANT
KIT TURNOVER KIT A (KITS) IMPLANT
LOOP VESSEL MAXI BLUE (MISCELLANEOUS) IMPLANT
NDL HYPO 22X1.5 SAFETY MO (MISCELLANEOUS) ×1 IMPLANT
NEEDLE HYPO 22X1.5 SAFETY MO (MISCELLANEOUS) ×1
PACK BASIC VI WITH GOWN DISP (CUSTOM PROCEDURE TRAY) ×1 IMPLANT
PACK GENERAL/GYN (CUSTOM PROCEDURE TRAY) ×1 IMPLANT
PENCIL SMOKE EVACUATOR (MISCELLANEOUS) IMPLANT
SHEARS HARMONIC 9CM CVD (BLADE) IMPLANT
SPIKE FLUID TRANSFER (MISCELLANEOUS) ×1 IMPLANT
SPONGE HEMORRHOID 8X3CM (HEMOSTASIS) IMPLANT
SPONGE SURGIFOAM ABS GEL 100 (HEMOSTASIS) IMPLANT
SURGILUBE 2OZ TUBE FLIPTOP (MISCELLANEOUS) ×1 IMPLANT
SUT CHROMIC 2 0 SH (SUTURE) ×1 IMPLANT
SUT CHROMIC 3 0 SH 27 (SUTURE) IMPLANT
SUT MNCRL AB 4-0 PS2 18 (SUTURE) ×1 IMPLANT
SUT VIC AB 2-0 SH 27X BRD (SUTURE) IMPLANT
SUT VIC AB 2-0 UR6 27 (SUTURE) ×6 IMPLANT
SUT VIC AB 3-0 SH 27X BRD (SUTURE) ×1 IMPLANT
SYR 20ML LL LF (SYRINGE) ×1 IMPLANT
SYR 3ML LL SCALE MARK (SYRINGE) IMPLANT
TOWEL OR 17X26 10 PK STRL BLUE (TOWEL DISPOSABLE) ×1 IMPLANT
TOWEL OR NON WOVEN STRL DISP B (DISPOSABLE) ×1 IMPLANT

## 2023-05-25 NOTE — Transfer of Care (Signed)
Immediate Anesthesia Transfer of Care Note  Patient: Kent [name]  Procedure(s) Performed: HEMORRHOIDECTOMY INTERNAL (Rectum) ANORECTAL EXAM UNDER ANESTHESIA (Rectum)  Patient Location: PACU  Anesthesia Type:General  Level of Consciousness: awake, alert , and oriented  Airway & Oxygen Therapy: Patient Spontanous Breathing and Patient connected to face mask oxygen  Post-op Assessment: Report given to RN and Post -op Vital signs reviewed and stable  Post vital signs: Reviewed and stable  Last Vitals:  Vitals Value Taken Time  BP 159/70 05/25/23 1334  Temp    Pulse 89 05/25/23 1335  Resp 22 05/25/23 1335  SpO2 100 % 05/25/23 1335  Vitals shown include unfiled device data.  Last Pain:  Vitals:   05/25/23 0900  TempSrc:   PainSc: 0-No pain         Complications: No notable events documented.

## 2023-05-25 NOTE — H&P (Signed)
CC: Here today for surgery  HPI: Kent [name] is an 82 y.o. male who is here for with history of paroxysmal A-fib (on Pradaxa); whom is seen in the office today as a referral by Dr. Ewing Schlein for evaluation of hemorrhoids.   He has a history of total abdominal colectomy with ileostomy by Dr. 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  Subsequent ostomy reversal with ileorectal anastomosis 09/2002, Dr. Orson Slick.  Flex sig Dr. Ewing Schlein 02/19/23: - Hemorrhoids - Rectum and rectosigmoid colon normal - Patent end-to-side ileorectal anastomosis - Terminal ileum was normal - No specimens  CT A/P without contrast 08/19/22 1. No acute findings or clear explanation for the patient's symptoms. 2. Stable postsurgical changes from subtotal colectomy. Incomplete distension versus circumferential wall thickening of the rectum. Given the patient's weight loss, consider further evaluation with colonoscopy. No evidence of recurrent bowel obstruction or perforation. 3. Chronic subpleural reticulation in both lung bases. 4. Aortic Atherosclerosis (ICD10-I70.0).  Has had ongoing issues with bleeding and discomfort 2/2 hemorrhoids which have apparently recurred. Due to his surgical anatomy (ileorectal anastomosis) he is not at all having issues with constipation. His stool frequency is managed with dietary changes and has ~2 BM per day. Spends ~3-4 minutes on commode. Drinks 5-6 glasses of water per day. Denies straining.    PMH: paroxysmal A-fib (on Pradaxa)  PSH:  Ischemic colitis-ex lap, subtotal colectomy with ileostomy (2003) Ileostomy takedown with ileorectal anastomosis (2004) Ex lap/lysis of adhesions, repair of incarcerated incisional hernia-Dr. Janee Morn, 12/2012 Open incisional hernia pair with mesh, 10/has not 15-Dr. Janee Morn Lap chole-/2016, Dr. Janee Morn Ex lap, lysis of adhesions for recurrent partial small bowel obstruction  02/2017, Dr. Janee Morn At least 2 stated hemorrhoidectomies in the 1990s by Dr. Jamey Ripa  FHx: Denies any known family history of colorectal, breast, endometrial or ovarian cancer  Social Hx: Denies use of tobacco/EtOH/illicit drug. 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)     Past Surgical History:  Procedure Laterality Date   BACK SURGERY  08/25/2001   Neck 3 disk replacements   BACK SURGERY     Placement/removal of nerve stimulator   CHOLECYSTECTOMY N/A 10/16/2014   Procedure: LAPAROSCOPIC CHOLECYSTECTOMY WITH ATTEMPTED CHOLANGIOGRAM;  Surgeon: Violeta Gelinas, MD;  [facility];  Service: General;  Laterality: N/A;   COLON SURGERY  04/2002   Dr. Orson Slick - Subtotal colectomy, ileostomy   ELBOW SURGERY Right    for blood vessel & " hung" nerve   ESOPHAGEAL DILATION     EXPLORATORY LAPAROTOMY  03/10/2017   HERNIA REPAIR     Ileostomy Takedown  08/2002   Dr. Orson Slick   INCISION AND DRAINAGE FOOT     INCISIONAL HERNIA REPAIR N/A 01/20/2013   Procedure: HERNIA REPAIR INCISIONAL;  Surgeon: Liz Malady, MD;  Location: [facility] OR;  Service: General;  Laterality: N/A;   INCISIONAL HERNIA REPAIR  04/13/2014  with mesh    dr Nelda Severe HERNIA REPAIR N/A 04/13/2014   Procedure: OPEN REPAIR INCISIONAL HERNIA ;  Surgeon: Violeta Gelinas, MD;  Location: [facility] OR;  Service: General;  Laterality: N/A;   INSERTION OF MESH N/A 04/13/2014   Procedure: INSERTION OF MESH;  Surgeon: Violeta Gelinas,  MD;  Location: [facility] OR;  Service: General;  Laterality: N/A;   KNEE ARTHROSCOPY Right 2010   LAPAROSCOPIC CHOLECYSTECTOMY  10/16/2014   LAPAROTOMY N/A 01/20/2013   Procedure: EXPLORATORY LAPAROTOMY;  Surgeon: Liz Malady, MD;  Location[facility] OR;  Service: General;  Laterality: N/A;   LAPAROTOMY N/A 03/10/2017   Procedure: EXPLORATORY LAPAROTOMY;  Surgeon: Violeta Gelinas, MD;  Location: [facility] OR;  Service: General;  Laterality: N/A;   Laparotomy with lysis of adhesions  2010   Dr. Janee Morn - SBO   LYSIS OF ADHESION N/A 01/20/2013   Procedure: LYSIS OF ADHESION;  Surgeon: Liz Malady, MD;  Location: [facility] OR;  Service: General;  Laterality: N/A;   LYSIS OF ADHESION N/A 03/10/2017   Procedure: LYSIS OF ADHESION;  Surgeon: Violeta Gelinas, MD;  Location: [facility] OR;  Service: General;  Laterality: N/A;   microsurgical  03/24/2002   Lateral recess decompression L5   SACRAL NERVE STIMULATOR PLACEMENT     inserted and removed   SHOULDER ARTHROSCOPY Bilateral    VASECTOMY      Family History  Problem Relation Age of Onset   Heart disease Mother    Brain cancer Father     Social:  reports that he has quit smoking. He quit smokeless tobacco use about 37 years ago.  His smokeless tobacco use included chew. He reports that he does not drink alcohol and does not use drugs.  Allergies:  Allergies  Allergen Reactions   Aspirin     dr said not to   Cephalexin Itching   Gabapentin Other (See Comments)    Unknown reaction    Naproxen Itching   Pregabalin     Unknown reaction    Hydrocodone-Acetaminophen Itching and Rash    Medications: I have reviewed the patient's current medications.  No results found for this or any previous visit (from the past 48 hour(s)).  No results found.  ROS - all of the below systems have been reviewed with the patient and positives are indicated with bold text General: chills, fever or night sweats Eyes: blurry vision or double vision ENT: epistaxis or sore  throat Allergy/Immunology: itchy/watery eyes or nasal congestion Hematologic/Lymphatic: bleeding problems, blood clots or swollen lymph nodes Endocrine: temperature intolerance or unexpected weight changes Breast: new or changing breast lumps or nipple discharge Resp: cough, shortness of breath, or wheezing CV: chest pain or dyspnea on exertion GI: as per HPI GU: dysuria, trouble voiding, or hematuria MSK: joint pain or joint stiffness Neuro: TIA or stroke symptoms Derm: pruritus and skin lesion changes Psych: anxiety and depression  PE There were no vitals taken for this visit. Constitutional: NAD; conversant Eyes: Moist conjunctiva Lungs: Normal respiratory effort CV: irreg rate/rhythm Anorectal: Normal perianal skin. DRE-normal tone/squeeze. No palpable masses Anoscopy: Circumferential anoscopy demonstrates at least 2 column internal hemorrhoids, no fissures or ulcerations.  Psychiatric: Appropriate affect  No results found for this or any previous visit (from the past 48 hour(s)).  No results found.  A/P: Kent [name] is an 82 y.o. male with with hx of TAC with ileorectal anstomosis, A-fib on Pradaxa, here for evaluation of bleeding, prolapsing bleeding internal hemorrhoids  -He does  have baseline diarrhea and takes cholestyramine. For these reasons, not traditional hemorrhoid patient in the sense that constipation is a predominating factor in his underlying issues. He does have prolapsing, bleeding internal hemorrhoids and periodic risk, off of his blood thinner due to the volume of blood that he has.  -We have therefore spent time discussing options going forward including surgery. He is much interested in pursuing surgery. He does note a history of having had at least 2 or 3 prior hemorrhoid surgeries in the past for similar issues by Dr. Jamey Ripa.  -Cleared for surgery for hemorrhoids by Dr. Elease Hashimoto - has been off Pradaxa x 4 days now  -The anatomy and physiology of the  anal canal was discussed with the patient with associated pictures. The pathophysiology of hemorrhoids was discussed at length with associated pictures and illustrations. -We have reviewed options going forward including further observation vs surgery - hemorrhoidectomy; anorectal exam under anesthesia -The planned procedure, material risks (including, but not limited to, pain, bleeding, infection, scarring, need for blood transfusion, damage to anal sphincter, incontinence of gas and/or stool, need for additional procedures, anal stenosis, rare cases of pelvic sepsis which in severe cases may require things like a colostomy, recurrence, pneumonia, heart attack, stroke, death) benefits and alternatives to surgery were discussed at length. I noted a good probability that the procedure would help improve his symptoms. The patient's questions were answered to his satisfaction, he voiced understanding and elected to proceed with surgery. Additionally, we discussed typical postoperative expectations and the recovery process.   Marin Olp, MD [facility]

## 2023-05-25 NOTE — Op Note (Signed)
05/25/2023  1:22 PM  PATIENT:  Kent [name]  82 y.o. male  Patient Care Team: Noberto Retort, MD as PCP - General (Family Medicine) Nahser, Deloris Ping, MD as PCP - Cardiology (Cardiology)  PRE-OPERATIVE DIAGNOSIS:  Hemorrhoids  POST-OPERATIVE DIAGNOSIS:  Same  PROCEDURE:   Hemorrhoidectomy - 2 column Anorectal exam under anesthesia  SURGEON:  Surgeon(s): Andria Meuse, MD  ASSISTANT: OR Staff  ANESTHESIA:   local and general  SPECIMEN:   Right posterior hemorrhoidal tissue  Left lateral hemorrhoidal tissue  DISPOSITION OF SPECIMEN:  PATHOLOGY  COUNTS:  Sponge, needle, and instrument counts were reported correct x2 at conclusion.  EBL: 5 mL  Drains: None  PLAN OF CARE: Discharge to home after PACU  PATIENT DISPOSITION:  PACU - hemodynamically stable.  OR FINDINGS:  Mild scarring consistent with prior hemorrhoid procedures in the past.  No anal stenosis. 2 columns of hemorrhoids-right posterior which are internal and freely prolapse and left lateral which are external.  2 column hemorrhoidectomy carried out.  DESCRIPTION: The patient was identified in the preoperative holding area and taken to the OR. SCDs were applied.  He then underwent general endotracheal anesthesia without difficulty. The patient was then rolled onto the OR table in the prone jackknife position. Pressure points were then evaluated and padded. Benzoin was applied to the buttocks and they were gently taped apart.  He was then prepped and draped in usual sterile fashion.  A surgical timeout was performed indicating the correct patient, procedure, and positioning.  A perianal block was then created using a dilute mixture of 0.25% Marcaine with epinephrine and Exparel.  After ascertaining an appropriate level of anesthesia had been achieved, a well lubricated digital rectal exam was performed. This demonstrated no palpable abnormalities.  A Hill-Ferguson anoscope was into the anal canal and  circumferential inspection demonstrated healthy appearing anoderm.  There is some scarring within the anal canal consistent with his prior hemorrhoid surgeries.  There is no significant anal stenosis, easily accommodating the medium sized Hill-Ferguson anoscope.  Within the right posterior position, there is prolapsing internal hemorrhoidal tissue.  There is no active bleeding.  Externally, he also has a somewhat edematous left lateral external hemorrhoid.  Attention is first directed at the right posterior internal hemorrhoidal tissue.  The tissue was grasped and elevated the DeBakey forcep.  We then incised the anoderm and dissected free the underlying sphincter muscle.  After this, we proceeded to complete the hemorrhoidectomy using a hand-held small jawed LigaSure for hemostasis purposes given his anticoagulation history.  The hemorrhoidal tissue was then passed off as specimen.  A figure-of-eight 2-0 Vicryl stitch is used to place a stitch at the apex for the hemorrhoidal blood supply is located.  The hemorrhoidal defect is then closed using a running 3-0 Vicryl suture.  Attention is then directed at the left lateral hemorrhoidal tissue.  There is again no evident anal stenosis.  The hemorrhoidal tissue was incised in elliptical type manner.  It is then dissected free of the lying sphincter muscle and excised with electrocautery.  The left lateral hemorrhoidal tissue which is external in nature is passed off as specimen.  Hemostasis is achieved electrocautery.  The resultant hemorrhoidal defect is closed using a running 3-0 Vicryl suture.  The anal canal is then reinspected.  It is irrigated.  Hemostasis is verified.  All sponge, needle, and instrument counts are reported correct.  Additional local anesthetic is infiltrated at the hemorrhoidectomy sites.  Given his history of taking  an anticoagulant, we opted also place a piece of Surgifoam into the anal canal.  The buttocks were then untaped.  A  dressing consisting of 4 x 4's, ABD, and mesh underwear was placed.  He was rolled back onto a stretcher, awakened from anesthesia, extubated, and transported to the recovery room in satisfactory condition.  DISPOSITION: PACU in satisfactory condition.

## 2023-05-25 NOTE — Anesthesia Postprocedure Evaluation (Signed)
Anesthesia Post Note  Patient: Kent [name]  Procedure(s) Performed: HEMORRHOIDECTOMY INTERNAL (Rectum) ANORECTAL EXAM UNDER ANESTHESIA (Rectum)     Patient location during evaluation: PACU Anesthesia Type: General Level of consciousness: awake and alert Pain management: pain level controlled Vital Signs Assessment: post-procedure vital signs reviewed and stable Respiratory status: spontaneous breathing, nonlabored ventilation, respiratory function stable and patient connected to nasal cannula oxygen Cardiovascular status: blood pressure returned to baseline and stable Postop Assessment: no apparent nausea or vomiting Anesthetic complications: no   No notable events documented.  Last Vitals:  Vitals:   05/25/23 1400 05/25/23 1415  BP: (!) 165/74 (!) 166/88  Pulse: 85 83  Resp: 15 15  Temp: 36.6 C 36.4 C  SpO2: 99% 100%    Last Pain:  Vitals:   05/25/23 1415  TempSrc:   PainSc: 0-No pain                 Rimas Gilham S

## 2023-05-25 NOTE — Discharge Instructions (Addendum)
ANORECTAL SURGERY: POST OP INSTRUCTIONS  DIET: Follow a light bland diet the first 24 hours after arrival home, such as soup, liquids, crackers, etc.  Be sure to include lots of fluids daily.  Avoid fast food or heavy meals as your are more likely to get nauseated.  Eat a low fat diet the next few days after surgery.   Some bleeding with bowel movements is expected for the first couple of days but this should stop in between bowel movements  Take your usually prescribed home medications unless otherwise directed. No foreign bodies per rectum for the next 3 months (enemas, etc)  PAIN CONTROL: It is helpful to take an over-the-counter pain medication regularly for the first few days/weeks.  Choose from the following that works best for you: Ibuprofen (Advil, etc) Three 200mg  tabs every 6 hours as needed. Acetaminophen (Tylenol, etc) 500-650mg  every 6 hours as needed NOTE: You may take both of these medications together - most patients find it most helpful when alternating between the two (i.e. Ibuprofen at 6am, tylenol at 9am, ibuprofen at 12pm ..Marland Kitchen) A  prescription for pain medication may have been prescribed for you at discharge.  Take your pain medication as prescribed.  If you are having problems/concerns with the prescription medicine, please call us for further advice.  Avoid getting constipated.  Between the surgery and the pain medications, it is common to experience some constipation.  Increasing fluid intake (64oz of water per day) and taking a fiber supplement (such as Metamucil, Citrucel, FiberCon) 1-2 times a day regularly will usually help prevent this problem from occurring. If no bowel movement after 48 hours, you may additionally take a laxative like a bottle of Milk of Magnesia which can be purchased over the counter. Avoid enemas.   Watch out for diarrhea.  If you have many loose bowel movements, simplify your diet to bland foods.  Stop any stool softeners and decrease your fiber  supplement. If this worsens or does not improve, please call us.  Wash / shower every day.  If you were discharged with a dressing, you may remove this the day after your surgery. You may shower normally, getting soap/water on your wound, particularly after bowel movements.  Soaking in a warm bath filled a couple inches ("Sitz bath") is a great way to clean the area after a bowel movement and many patients find it is a way to soothe the area.  ACTIVITIES as tolerated:   You may resume regular (light) daily activities beginning the next day--such as daily self-care, walking, climbing stairs--gradually increasing activities as tolerated.  If you can walk 30 minutes without difficulty, it is safe to try more intense activity such as jogging, treadmill, bicycling, low-impact aerobics, etc. Refrain from any heavy lifting or straining for the first 2 weeks after your procedure, particularly if your surgery was for hemorrhoids. Avoid activities that make your pain worse You may drive when you are no longer taking prescription pain medication, you can comfortably wear a seatbelt, and you can safely maneuver your car and apply brakes.  FOLLOW UP in our office Please call [facility] at [phone] to set up an appointment to see your surgeon in the office for a follow-up appointment approximately 2 weeks after your surgery. Make sure that you call for this appointment the day you arrive home to insure a convenient appointment time.  9. If you have disability or family leave forms that need to be completed, you may have them completed by your  primary care physician's office; for return to work instructions, please ask our office staff and they will be happy to assist you in obtaining this documentation   When to call us [phone]: Poor pain control Reactions / problems with new medications (rash/itching, etc)  Fever over 101.5 F (38.5 C) Inability to urinate Nausea/vomiting Worsening swelling or  bruising Continued bleeding from incision. Increased pain, redness, or drainage from the incision  The clinic staff is available to answer your questions during regular business hours (8:30am-5pm).  Please don't hesitate to call and ask to speak to one of our nurses for clinical concerns.   A surgeon from [facility] is always on call at the hospitals   If you have a medical emergency, go to the nearest emergency room or call 911.   [facility] [address], [address], [place], [place]  [address] MAIN: [phone] FAX: [phone] [email or web address]

## 2023-05-25 NOTE — Anesthesia Procedure Notes (Signed)
Procedure Name: Intubation Date/Time: 05/25/2023 12:39 PM  Performed by: Cleda Clarks, CRNAPre-anesthesia Checklist: Patient identified, Emergency Drugs available, Suction available and Patient being monitored Patient Re-evaluated:Patient Re-evaluated prior to induction Oxygen Delivery Method: Circle system utilized Preoxygenation: Pre-oxygenation with 100% oxygen Induction Type: IV induction Ventilation: Mask ventilation without difficulty Laryngoscope Size: Miller and 2 Tube type: Oral Tube size: 7.0 mm Number of attempts: 1 Airway Equipment and Method: Stylet and Oral airway Placement Confirmation: ETT inserted through vocal cords under direct vision, positive ETCO2 and breath sounds checked- equal and bilateral Secured at: 21 cm Tube secured with: Tape Dental Injury: Teeth and Oropharynx as per pre-operative assessment

## 2023-05-26 ENCOUNTER — Encounter (HOSPITAL_COMMUNITY): Payer: Self-pay | Admitting: Surgery

## 2023-05-26 LAB — SURGICAL PATHOLOGY

## 2023-07-13 ENCOUNTER — Telehealth: Payer: Self-pay | Admitting: Cardiovascular Disease

## 2023-07-13 NOTE — Telephone Encounter (Signed)
Called patient back about his message. Patient stated that he feels lightheaded and SOB with activity. Patient thinks it is his metoprolol 12. 5 mg that he is taking daily. Patient stated he does not take his morphine all the time. Will send message to Dr. Elease Hashimoto for advisement.

## 2023-07-13 NOTE — Telephone Encounter (Signed)
Pt c/o medication issue:  1. Name of Medication:   metoprolol succinate (TOPROL XL) 25 MG 24 hr tablet   2. How are you currently taking this medication (dosage and times per day)?   3. Are you having a reaction (difficulty breathing--STAT)?   4. What is your medication issue?   Patient stated his medication is not working and he has been having more SOB on exertion.  Patient has visit scheduled on 1/24.

## 2023-07-16 ENCOUNTER — Telehealth: Payer: Self-pay | Admitting: Cardiovascular Disease

## 2023-07-16 NOTE — Telephone Encounter (Signed)
Called and spoke to patient Kent [name], Kent Ping, MD sent to Lars Mage, RN Caller: Unspecified (3 days ago, 11:09 AM) He was hypotensive at his last office visit I doubt his metoprolol is causing this but Im fine with having him hold this to see if his symptoms improve. PN  Patient states he will hold metoprolol for one week then call back to let us know how he's doing.

## 2023-07-16 NOTE — Telephone Encounter (Signed)
Patient is calling to talk with Dr. Elease Hashimoto or nurse regarding medication

## 2023-07-16 NOTE — Progress Notes (Deleted)
{  This patient may be at risk for Amyloid. He has one or more dx on the problem list or PMH from the following list - Abnormal EKG, CHF, Aortic Stenosis, Proteinuria, LVH, Carpal Tunnel Syndrome, Biceps Tendon Rupture, Syncope. See list below or review PMH.  Diagnoses From Problem List           Noted     Nonspecific abnormal electrocardiogram (ECG) (EKG) 09/01/2016    Click HERE to open Cardiac Amyloid Screening SmartSet to order screening OR Click HERE to defer testing for 1 year or permanently :1}    Cardiology Office Note:    Date:  07/16/2023  ID:  Kent [name], DOB [birth year], MRN [id number] PCP: Noberto Retort, MD  [facility] Providers Cardiologist:  Kristeen Miss, MD { Click to update primary MD,subspecialty MD or APP then REFRESH:1}    {Click to Open Review  :1}   Patient Profile:      Paroxysmal atrial fibrillation TTE 07/05/2022: EF 55-60, no RWMA, normal RVSF, mild-moderate LAE, trivial MR, AV sclerosis, RAP 3 Hx of TIA Coronary artery calcification Aortic atherosclerosis     {      :1}   History of Present Illness:  Discussed the use of AI scribe software for clinical note transcription with the patient, who gave verbal consent to proceed.  Kent [name] is a 83 y.o. male who returns for evaluation of shortness of breath.  He was last seen by Dr. Elease Hashimoto in October 2024.  He recently called in with lightheadedness and shortness of breath.  He was concerned about low blood pressure and metoprolol was held.***      ROS-See HPI ***    Studies Reviewed:       *** Results          Risk Assessment/Calculations:   {Does this patient have ATRIAL FIBRILLATION?:[id number]} No BP recorded.  {Refresh Note OR Click here to enter BP  :1}***       Physical Exam:   VS:  There were no vitals taken for this visit.   Wt Readings from Last 3 Encounters:  05/25/23 152 lb (68.9 kg)  05/12/23 152 lb (68.9 kg)  03/31/23 162 lb 12.8 oz (73.8 kg)    Physical Exam***      Assessment and Plan:   Assessment & Plan PAF (paroxysmal atrial fibrillation) (HCC)   Assessment and Plan             {      :1}    {Are you ordering a CV Procedure (e.g. stress test, cath, DCCV, TEE, etc)?   Press F2        :[id number]}  Dispo:  No follow-ups on file.  Signed, Tereso Newcomer, PA-C

## 2023-07-17 ENCOUNTER — Ambulatory Visit: Payer: Medicare Other | Admitting: Physician Assistant

## 2023-07-17 DIAGNOSIS — I480 Paroxysmal atrial fibrillation: Secondary | ICD-10-CM

## 2023-07-20 NOTE — Addendum Note (Signed)
Addended by: Lars Mage on: 07/20/2023 03:23 PM   Modules accepted: Orders

## 2023-07-20 NOTE — Telephone Encounter (Signed)
Returned call to patient who states he's been off the metoprolol for a week now and is feeling much better. He denies having any issues with lightheadedness or shortness of breath since stopping. He doesn't routinely check BP/HR at home, but had him check while on the phone and pt reports 141/68 (fussing at wife over using machine, coughing and walking around), HR reportedly is 82. He states he is feeling "so much better" since stopping the metoprolol, specifically that he hasn't had any shortness of breath or dizziness as he was experiencing. Spoke with Dr Elease Hashimoto during clinic who advised he remain off of it and keep check on BP and HR. Explained to patient. He agrees to check 2-3x/week and let us know if BP or HR are trending any higher than what he's reporting now. No further questions.

## 2023-07-22 ENCOUNTER — Telehealth: Payer: Self-pay | Admitting: Cardiovascular Disease

## 2023-07-22 ENCOUNTER — Inpatient Hospital Stay (HOSPITAL_COMMUNITY)
Admission: EM | Admit: 2023-07-22 | Discharge: 2023-07-25 | DRG: 641 | Disposition: A | Discharge status: Home / Self Care | Payer: Medicare Other | Attending: Internal Medicine | Admitting: Internal Medicine

## 2023-07-22 ENCOUNTER — Emergency Department (HOSPITAL_COMMUNITY): Payer: Medicare Other

## 2023-07-22 ENCOUNTER — Other Ambulatory Visit: Payer: Self-pay

## 2023-07-22 ENCOUNTER — Encounter (HOSPITAL_COMMUNITY): Payer: Self-pay

## 2023-07-22 DIAGNOSIS — I482 Chronic atrial fibrillation, unspecified: Secondary | ICD-10-CM | POA: Diagnosis present

## 2023-07-22 DIAGNOSIS — D509 Iron deficiency anemia, unspecified: Secondary | ICD-10-CM

## 2023-07-22 DIAGNOSIS — M1909 Primary osteoarthritis, other specified site: Secondary | ICD-10-CM | POA: Diagnosis present

## 2023-07-22 DIAGNOSIS — R739 Hyperglycemia, unspecified: Secondary | ICD-10-CM | POA: Diagnosis present

## 2023-07-22 DIAGNOSIS — Z9049 Acquired absence of other specified parts of digestive tract: Secondary | ICD-10-CM

## 2023-07-22 DIAGNOSIS — I4891 Unspecified atrial fibrillation: Secondary | ICD-10-CM | POA: Diagnosis present

## 2023-07-22 DIAGNOSIS — Z79899 Other long term (current) drug therapy: Secondary | ICD-10-CM

## 2023-07-22 DIAGNOSIS — Z881 Allergy status to other antibiotic agents status: Secondary | ICD-10-CM

## 2023-07-22 DIAGNOSIS — Z886 Allergy status to analgesic agent status: Secondary | ICD-10-CM

## 2023-07-22 DIAGNOSIS — J3489 Other specified disorders of nose and nasal sinuses: Secondary | ICD-10-CM | POA: Diagnosis present

## 2023-07-22 DIAGNOSIS — R1030 Lower abdominal pain, unspecified: Secondary | ICD-10-CM

## 2023-07-22 DIAGNOSIS — R030 Elevated blood-pressure reading, without diagnosis of hypertension: Secondary | ICD-10-CM

## 2023-07-22 DIAGNOSIS — Z8719 Personal history of other diseases of the digestive system: Secondary | ICD-10-CM

## 2023-07-22 DIAGNOSIS — Z23 Encounter for immunization: Secondary | ICD-10-CM

## 2023-07-22 DIAGNOSIS — R112 Nausea with vomiting, unspecified: Principal | ICD-10-CM

## 2023-07-22 DIAGNOSIS — Z87891 Personal history of nicotine dependence: Secondary | ICD-10-CM

## 2023-07-22 DIAGNOSIS — I700 Atherosclerosis of aorta: Secondary | ICD-10-CM | POA: Diagnosis present

## 2023-07-22 DIAGNOSIS — I10 Essential (primary) hypertension: Secondary | ICD-10-CM

## 2023-07-22 DIAGNOSIS — I129 Hypertensive chronic kidney disease with stage 1 through stage 4 chronic kidney disease, or unspecified chronic kidney disease: Secondary | ICD-10-CM | POA: Diagnosis present

## 2023-07-22 DIAGNOSIS — G8929 Other chronic pain: Secondary | ICD-10-CM | POA: Diagnosis present

## 2023-07-22 DIAGNOSIS — E861 Hypovolemia: Secondary | ICD-10-CM | POA: Diagnosis present

## 2023-07-22 DIAGNOSIS — Z808 Family history of malignant neoplasm of other organs or systems: Secondary | ICD-10-CM

## 2023-07-22 DIAGNOSIS — Z888 Allergy status to other drugs, medicaments and biological substances status: Secondary | ICD-10-CM

## 2023-07-22 DIAGNOSIS — Z7902 Long term (current) use of antithrombotics/antiplatelets: Secondary | ICD-10-CM

## 2023-07-22 DIAGNOSIS — D62 Acute posthemorrhagic anemia: Secondary | ICD-10-CM | POA: Diagnosis present

## 2023-07-22 DIAGNOSIS — K219 Gastro-esophageal reflux disease without esophagitis: Secondary | ICD-10-CM | POA: Diagnosis present

## 2023-07-22 DIAGNOSIS — M549 Dorsalgia, unspecified: Secondary | ICD-10-CM | POA: Diagnosis present

## 2023-07-22 DIAGNOSIS — I480 Paroxysmal atrial fibrillation: Secondary | ICD-10-CM | POA: Diagnosis present

## 2023-07-22 DIAGNOSIS — Z8249 Family history of ischemic heart disease and other diseases of the circulatory system: Secondary | ICD-10-CM

## 2023-07-22 DIAGNOSIS — E871 Hypo-osmolality and hyponatremia: Principal | ICD-10-CM | POA: Diagnosis present

## 2023-07-22 DIAGNOSIS — Z885 Allergy status to narcotic agent status: Secondary | ICD-10-CM

## 2023-07-22 DIAGNOSIS — Z85828 Personal history of other malignant neoplasm of skin: Secondary | ICD-10-CM

## 2023-07-22 DIAGNOSIS — I440 Atrioventricular block, first degree: Secondary | ICD-10-CM | POA: Diagnosis present

## 2023-07-22 DIAGNOSIS — Z9889 Other specified postprocedural states: Secondary | ICD-10-CM

## 2023-07-22 DIAGNOSIS — N182 Chronic kidney disease, stage 2 (mild): Secondary | ICD-10-CM | POA: Diagnosis present

## 2023-07-22 LAB — CBC
HCT: 25.1 % — ABNORMAL LOW (ref 39.0–52.0)
Hemoglobin: 8 g/dL — ABNORMAL LOW (ref 13.0–17.0)
MCH: 25.8 pg — ABNORMAL LOW (ref 26.0–34.0)
MCHC: 31.9 g/dL (ref 30.0–36.0)
MCV: 81 fL (ref 80.0–100.0)
Platelets: 252 10*3/uL (ref 150–400)
RBC: 3.1 MIL/uL — ABNORMAL LOW (ref 4.22–5.81)
RDW: 14.1 % (ref 11.5–15.5)
WBC: 4.8 10*3/uL (ref 4.0–10.5)
nRBC: 0 % (ref 0.0–0.2)

## 2023-07-22 LAB — DIFFERENTIAL
Abs Immature Granulocytes: 0.01 10*3/uL (ref 0.00–0.07)
Basophils Absolute: 0.1 10*3/uL (ref 0.0–0.1)
Basophils Relative: 1 %
Eosinophils Absolute: 0.1 10*3/uL (ref 0.0–0.5)
Eosinophils Relative: 1 %
Immature Granulocytes: 0 %
Lymphocytes Relative: 16 %
Lymphs Abs: 0.8 10*3/uL (ref 0.7–4.0)
Monocytes Absolute: 0.4 10*3/uL (ref 0.1–1.0)
Monocytes Relative: 8 %
Neutro Abs: 3.6 10*3/uL (ref 1.7–7.7)
Neutrophils Relative %: 74 %

## 2023-07-22 LAB — I-STAT CHEM 8, ED
BUN: 16 mg/dL (ref 8–23)
Calcium, Ion: 1.11 mmol/L — ABNORMAL LOW (ref 1.15–1.40)
Chloride: 90 mmol/L — ABNORMAL LOW (ref 98–111)
Creatinine, Ser: 1.2 mg/dL (ref 0.61–1.24)
Glucose, Bld: 118 mg/dL — ABNORMAL HIGH (ref 70–99)
HCT: 27 % — ABNORMAL LOW (ref 39.0–52.0)
Hemoglobin: 9.2 g/dL — ABNORMAL LOW (ref 13.0–17.0)
Potassium: 4.2 mmol/L (ref 3.5–5.1)
Sodium: 124 mmol/L — ABNORMAL LOW (ref 135–145)
TCO2: 23 mmol/L (ref 22–32)

## 2023-07-22 LAB — COMPREHENSIVE METABOLIC PANEL
ALT: 26 U/L (ref 0–44)
AST: 25 U/L (ref 15–41)
Albumin: 3.6 g/dL (ref 3.5–5.0)
Alkaline Phosphatase: 105 U/L (ref 38–126)
Anion gap: 11 (ref 5–15)
BUN: 16 mg/dL (ref 8–23)
CO2: 22 mmol/L (ref 22–32)
Calcium: 9 mg/dL (ref 8.9–10.3)
Chloride: 90 mmol/L — ABNORMAL LOW (ref 98–111)
Creatinine, Ser: 1.19 mg/dL (ref 0.61–1.24)
GFR, Estimated: >60 mL/min (ref >60)
Glucose, Bld: 119 mg/dL — ABNORMAL HIGH (ref 70–99)
Potassium: 4 mmol/L (ref 3.5–5.1)
Sodium: 123 mmol/L — ABNORMAL LOW (ref 135–145)
Total Bilirubin: 0.9 mg/dL (ref 0.0–1.2)
Total Protein: 7.8 g/dL (ref 6.5–8.1)

## 2023-07-22 LAB — RESP PANEL BY RT-PCR (RSV, FLU A&B, COVID)  RVPGX2
Influenza A by PCR: NEGATIVE
Influenza B by PCR: NEGATIVE
Resp Syncytial Virus by PCR: NEGATIVE
SARS Coronavirus 2 by RT PCR: NEGATIVE

## 2023-07-22 LAB — TROPONIN I (HIGH SENSITIVITY): Troponin I (High Sensitivity): 16 ng/L (ref <18)

## 2023-07-22 LAB — PROTIME-INR
INR: 1.4 — ABNORMAL HIGH (ref 0.8–1.2)
Prothrombin Time: 17.6 s — ABNORMAL HIGH (ref 11.4–15.2)

## 2023-07-22 LAB — APTT: aPTT: 52 s — ABNORMAL HIGH (ref 24–36)

## 2023-07-22 LAB — ETHANOL: Alcohol, Ethyl (B): <10 mg/dL (ref <10)

## 2023-07-22 MED ORDER — ONDANSETRON 4 MG PO TBDP
4.0000 mg | ORAL_TABLET | Freq: Once | ORAL | Status: AC
  Administered 2023-07-22: 4 mg via ORAL
  Filled 2023-07-22: qty 1

## 2023-07-22 NOTE — ED Provider Triage Note (Signed)
Emergency Medicine Provider Triage Evaluation Note  Kent [name] , a 83 y.o. male  was evaluated in triage.  Pt complains of headache, blurry vision, nausea, vomiting.  Review of Systems  Positive:  Negative:   Physical Exam  BP (!) 176/92 (BP Location: Right Arm)   Pulse 80   Temp 97.8 F (36.6 C)   Resp 16   SpO2 100%  Gen:   Awake, no distress   Resp:  Normal effort  MSK:   Moves extremities without difficulty  Other:    Medical Decision Making  Medically screening exam initiated at 8:54 PM.  Appropriate orders placed.  Kent [name] was informed that the remainder of the evaluation will be completed by another provider, this initial triage assessment does not replace that evaluation, and the importance of remaining in the ED until their evaluation is complete.  Patient with headache, blurry vision,  rhinorrhea, watery eye discharge, nausea, vomiting that started this morning. Also with increased BP starting yesterday. Patient also stating that he has a very vague and mild decreased sensation on left face and left arm. LNK 10:30PM yesterday. Patient woke up at 8:30AM this morning and states that symptoms were already present.  GCS 15. Speech is goal oriented. No deficits appreciated to CN III-XII; symmetric eyebrow raise, no facial drooping, tongue midline. Patient has equal grip strength bilaterally with 5/5 strength against resistance in all major muscle groups bilaterally. Sensation to light touch intact. Patient moves extremities without ataxia.    Dorthy Cooler, [place] 07/22/23 2059

## 2023-07-22 NOTE — Telephone Encounter (Signed)
Spoke with patient and he wanted to let Huntley Dec know his BP machine he has is not working right but he was able to check his BP today and it was 140/82 and HR 84

## 2023-07-22 NOTE — ED Triage Notes (Signed)
Patient reports high BP for the past few hours, followed by nausea, vomiting, headache, and blurry vision. BP 176/92. LKW 0830 this morning.

## 2023-07-22 NOTE — Telephone Encounter (Signed)
Patient states he is returning nurse Sarah's call.

## 2023-07-23 ENCOUNTER — Emergency Department (HOSPITAL_COMMUNITY): Payer: Medicare Other

## 2023-07-23 DIAGNOSIS — E871 Hypo-osmolality and hyponatremia: Secondary | ICD-10-CM | POA: Diagnosis present

## 2023-07-23 LAB — TROPONIN I (HIGH SENSITIVITY): Troponin I (High Sensitivity): 16 ng/L (ref <18)

## 2023-07-23 LAB — URINALYSIS, ROUTINE W REFLEX MICROSCOPIC
Bilirubin Urine: NEGATIVE
Glucose, UA: NEGATIVE mg/dL
Hgb urine dipstick: NEGATIVE
Ketones, ur: NEGATIVE mg/dL
Leukocytes,Ua: NEGATIVE
Nitrite: NEGATIVE
Protein, ur: NEGATIVE mg/dL
Specific Gravity, Urine: 1.012 (ref 1.005–1.030)
pH: 6 (ref 5.0–8.0)

## 2023-07-23 LAB — SODIUM: Sodium: 128 mmol/L — ABNORMAL LOW (ref 135–145)

## 2023-07-23 LAB — BASIC METABOLIC PANEL
Anion gap: 8 (ref 5–15)
BUN: 15 mg/dL (ref 8–23)
CO2: 24 mmol/L (ref 22–32)
Calcium: 8.8 mg/dL — ABNORMAL LOW (ref 8.9–10.3)
Chloride: 94 mmol/L — ABNORMAL LOW (ref 98–111)
Creatinine, Ser: 1.18 mg/dL (ref 0.61–1.24)
GFR, Estimated: >60 mL/min (ref >60)
Glucose, Bld: 98 mg/dL (ref 70–99)
Potassium: 4 mmol/L (ref 3.5–5.1)
Sodium: 126 mmol/L — ABNORMAL LOW (ref 135–145)

## 2023-07-23 LAB — LIPASE, BLOOD: Lipase: 39 U/L (ref 11–51)

## 2023-07-23 LAB — HEMOGLOBIN AND HEMATOCRIT, BLOOD
HCT: 25 % — ABNORMAL LOW (ref 39.0–52.0)
Hemoglobin: 8.1 g/dL — ABNORMAL LOW (ref 13.0–17.0)

## 2023-07-23 LAB — SODIUM, URINE, RANDOM: Sodium, Ur: 48 mmol/L

## 2023-07-23 LAB — OSMOLALITY, URINE: Osmolality, Ur: 314 mosm/kg (ref 300–900)

## 2023-07-23 LAB — OSMOLALITY: Osmolality: 271 mosm/kg — ABNORMAL LOW (ref 275–295)

## 2023-07-23 MED ORDER — IOHEXOL 350 MG/ML SOLN
80.0000 mL | Freq: Once | INTRAVENOUS | Status: AC | PRN
  Administered 2023-07-23: 80 mL via INTRAVENOUS

## 2023-07-23 MED ORDER — MORPHINE SULFATE ER 30 MG PO TBCR: 30.0000 mg | EXTENDED_RELEASE_TABLET | Freq: Two times a day (BID) | ORAL | Status: DC | PRN

## 2023-07-23 MED ORDER — ACETAMINOPHEN 325 MG PO TABS: 650.0000 mg | ORAL_TABLET | Freq: Four times a day (QID) | ORAL | Status: DC | PRN

## 2023-07-23 MED ORDER — DABIGATRAN ETEXILATE MESYLATE 150 MG PO CAPS
150.0000 mg | ORAL_CAPSULE | Freq: Two times a day (BID) | ORAL | Status: DC
  Administered 2023-07-23: 150 mg via ORAL
  Filled 2023-07-23 (×2): qty 1

## 2023-07-23 MED ORDER — SODIUM CHLORIDE 0.9 % IV BOLUS
1000.0000 mL | Freq: Once | INTRAVENOUS | Status: AC
  Administered 2023-07-23: 1000 mL via INTRAVENOUS

## 2023-07-23 MED ORDER — MORPHINE SULFATE ER 30 MG PO TBCR
30.0000 mg | EXTENDED_RELEASE_TABLET | Freq: Two times a day (BID) | ORAL | Status: DC | PRN
  Administered 2023-07-23 – 2023-07-25 (×2): 30 mg via ORAL
  Filled 2023-07-23 (×4): qty 1

## 2023-07-23 MED ORDER — AMLODIPINE BESYLATE 5 MG PO TABS
5.0000 mg | ORAL_TABLET | Freq: Every day | ORAL | Status: DC
  Administered 2023-07-23: 5 mg via ORAL
  Filled 2023-07-23: qty 1

## 2023-07-23 MED ORDER — CHOLESTYRAMINE 4 G PO PACK
1.0000 | PACK | Freq: Every day | ORAL | Status: DC
  Administered 2023-07-24 – 2023-07-25 (×2): 1 via ORAL
  Filled 2023-07-23 (×3): qty 1

## 2023-07-23 MED ORDER — ONDANSETRON 4 MG PO TBDP
4.0000 mg | ORAL_TABLET | Freq: Four times a day (QID) | ORAL | Status: DC | PRN
  Filled 2023-07-23: qty 1

## 2023-07-23 MED ORDER — SODIUM CHLORIDE 0.9 % IV SOLN: INTRAVENOUS | Status: DC

## 2023-07-23 MED ORDER — HYDRALAZINE HCL 20 MG/ML IJ SOLN: 10.0000 mg | Freq: Four times a day (QID) | INTRAMUSCULAR | Status: DC | PRN

## 2023-07-23 MED ORDER — ACETAMINOPHEN 650 MG RE SUPP: 650.0000 mg | Freq: Four times a day (QID) | RECTAL | Status: DC | PRN

## 2023-07-23 MED ORDER — SODIUM CHLORIDE 0.9 % IV SOLN
Freq: Once | INTRAVENOUS | Status: AC
Start: 2023-07-23 — End: 2023-07-23

## 2023-07-23 MED ORDER — DABIGATRAN ETEXILATE MESYLATE 150 MG PO CAPS
150.0000 mg | ORAL_CAPSULE | Freq: Two times a day (BID) | ORAL | Status: DC
  Administered 2023-07-23 – 2023-07-25 (×4): 150 mg via ORAL
  Filled 2023-07-23 (×5): qty 1

## 2023-07-23 MED ORDER — ONDANSETRON HCL 4 MG/2ML IJ SOLN
4.0000 mg | Freq: Four times a day (QID) | INTRAMUSCULAR | Status: DC | PRN
  Administered 2023-07-23: 4 mg via INTRAVENOUS
  Filled 2023-07-23: qty 2

## 2023-07-23 MED ORDER — ONDANSETRON HCL 4 MG/2ML IJ SOLN
4.0000 mg | Freq: Once | INTRAMUSCULAR | Status: AC
  Administered 2023-07-23: 4 mg via INTRAVENOUS
  Filled 2023-07-23: qty 2

## 2023-07-23 MED ORDER — POLYETHYLENE GLYCOL 3350 17 G PO PACK
17.0000 g | PACK | Freq: Every day | ORAL | Status: DC | PRN
Start: 2023-07-23 — End: 2023-07-25

## 2023-07-23 MED ORDER — PROCHLORPERAZINE EDISYLATE 10 MG/2ML IJ SOLN
10.0000 mg | Freq: Four times a day (QID) | INTRAMUSCULAR | Status: DC | PRN
  Administered 2023-07-23: 10 mg via INTRAVENOUS
  Filled 2023-07-23: qty 2

## 2023-07-23 NOTE — ED Provider Notes (Signed)
[name] Provider Note   CSN: [id number] Arrival date & time: 07/22/23  1931     History Chief Complaint  Patient presents with   Hypertension    Kent [name] is a 83 y.o. 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  Patient woke up this morning with nausea and vomiting between 5-10 episodes of nonbloody emesis.  Reports he started having some diffuse abdominal pain that was mild in nature after a few of the emesis episodes.  Reports that he had a normal bowel movement this morning that was not black or bloody.  No constipation or diarrhea.  Still would pass gas.  Does not have any urinary symptoms.  He reports he ate a steak and salad last night however no one else ate this.  He denies any chest pain, shortness of breath, or urinary symptoms.  Denies any trouble starting a stream.  He reports that yesterday he started to feel some tingling to the left side of his face that lasted a few seconds however has not experienced that since.  He denies any trouble walking or talking.  Denies any lightheadedness or dizziness.  Denies any vertiginous symptoms.  He denies any visual disturbances or photophobia.  Denies that this is the worst headache of his life.  He reports that he feels better with the Zofran that was given to him in triage.   Hypertension Associated symptoms include abdominal pain and headaches. Pertinent negatives include no chest pain and no shortness of breath.       Home Medications Prior to Admission medications   Medication Sig Start Date End Date Taking? Authorizing Provider  dabigatran (PRADAXA) 150 MG CAPS capsule Take 1 capsule (150 mg total) by mouth 2 (two) times daily. 10/15/22   Nahser, Deloris Ping, MD  loratadine-pseudoephedrine (CLARITIN-D 24 HOUR) 10-240 MG 24 hr tablet Take 1 tablet by mouth as needed for allergies.    [provider]   morphine (MS CONTIN) 30 MG 12 hr tablet Take 30 mg by mouth 2 (two) times daily as needed for pain. 01/20/18   [provider]      Allergies    Aspirin, Cephalexin, Gabapentin, Naproxen, Pregabalin, and Hydrocodone-acetaminophen    Review of Systems   Review of Systems  Constitutional:  Positive for fatigue. Negative for chills and fever.  HENT:  Negative for congestion and rhinorrhea.   Eyes:  Negative for photophobia and visual disturbance.  Respiratory:  Negative for cough and shortness of breath.   Cardiovascular:  Negative for chest pain.  Gastrointestinal:  Positive for abdominal pain, nausea and vomiting. Negative for blood in stool, constipation and diarrhea.  Genitourinary:  Negative for dysuria and hematuria.  Musculoskeletal:  Negative for gait problem and neck pain.  Neurological:  Positive for headaches. Negative for dizziness, syncope, speech difficulty and light-headedness.    Physical Exam Updated Vital Signs BP (!) 158/84   Pulse 71   Temp 97.8 F (36.6 C)   Resp 16   SpO2 100%  Physical Exam Constitutional:      General: He is not in acute distress.    Appearance: He is not toxic-appearing.  HENT:     Mouth/Throat:     Mouth: Mucous membranes are dry.  Eyes:     General: No scleral icterus.    Extraocular Movements: Extraocular movements intact.     Pupils: Pupils are equal, round, and reactive  to light.  Cardiovascular:     Rate and Rhythm: Normal rate.  Pulmonary:     Effort: Pulmonary effort is normal. No respiratory distress.     Breath sounds: Normal breath sounds.  Abdominal:     Palpations: Abdomen is soft.     Tenderness: There is abdominal tenderness. There is no guarding or rebound.     Comments: Soft.  Diffuse abdominal tenderness.  Faint surgical scar present in mid abdomen.  Musculoskeletal:     Right lower leg: No edema.     Left lower leg: No edema.  Skin:    General: Skin is warm and dry.  Neurological:     General: No  focal deficit present.     Mental Status: He is alert. Mental status is at baseline.     GCS: GCS eye subscore is 4. GCS verbal subscore is 5. GCS motor subscore is 6.     Cranial Nerves: No cranial nerve deficit, dysarthria or facial asymmetry.     Sensory: No sensory deficit.     Motor: No weakness or pronator drift.     ED Results / Procedures / Treatments   Labs (all labs ordered are listed, but only abnormal results are displayed) Labs Reviewed  PROTIME-INR - Abnormal; Notable for the following components:      Result Value   Prothrombin Time 17.6 (*)    INR 1.4 (*)    All other components within normal limits  APTT - Abnormal; Notable for the following components:   aPTT 52 (*)    All other components within normal limits  CBC - Abnormal; Notable for the following components:   RBC 3.10 (*)    Hemoglobin 8.0 (*)    HCT 25.1 (*)    MCH 25.8 (*)    All other components within normal limits  COMPREHENSIVE METABOLIC PANEL - Abnormal; Notable for the following components:   Sodium 123 (*)    Chloride 90 (*)    Glucose, Bld 119 (*)    All other components within normal limits  I-STAT CHEM 8, ED - Abnormal; Notable for the following components:   Sodium 124 (*)    Chloride 90 (*)    Glucose, Bld 118 (*)    Calcium, Ion 1.11 (*)    Hemoglobin 9.2 (*)    HCT 27.0 (*)    All other components within normal limits  RESP PANEL BY RT-PCR (RSV, FLU A&B, COVID)  RVPGX2  DIFFERENTIAL  ETHANOL  URINALYSIS, ROUTINE W REFLEX MICROSCOPIC  LIPASE, BLOOD  OSMOLALITY, URINE  OSMOLALITY  POC OCCULT BLOOD, ED  TROPONIN I (HIGH SENSITIVITY)  TROPONIN I (HIGH SENSITIVITY)    EKG None  Radiology CT ABDOMEN PELVIS W CONTRAST Result Date: 07/23/2023 CLINICAL DATA:  83 year old male with history of acute onset of nonlocalized abdominal pain. EXAM: CT ABDOMEN AND PELVIS WITH CONTRAST TECHNIQUE: Multidetector CT imaging of the abdomen and pelvis was performed using the standard protocol  following bolus administration of intravenous contrast. RADIATION DOSE REDUCTION: This exam was performed according to the departmental dose-optimization program which includes automated exposure control, adjustment of the mA and/or kV according to patient size and/or use of iterative reconstruction technique. CONTRAST:  80mL OMNIPAQUE IOHEXOL 350 MG/ML SOLN COMPARISON:  CT of the abdomen and pelvis 08/19/2022. FINDINGS: Lower chest: Atherosclerotic calcifications are noted in the descending thoracic aorta. Hepatobiliary: No suspicious cystic or solid hepatic lesions. No intra or extrahepatic biliary ductal dilatation. Status post cholecystectomy. Pancreas: No pancreatic mass. No pancreatic  ductal dilatation. No pancreatic or peripancreatic fluid collections or inflammatory changes. Spleen: Unremarkable. Adrenals/Urinary Tract: Bilateral kidneys and bilateral adrenal glands are normal in appearance. No hydroureteronephrosis. Urinary bladder is moderately distended, but otherwise unremarkable in appearance. Stomach/Bowel: The appearance of the stomach is normal. There is no pathologic dilatation of small bowel or colon. Postoperative changes of subtotal colectomy are redemonstrated. Vascular/Lymphatic: Atherosclerotic calcifications are noted in the abdominal aorta and pelvic vasculature. No lymphadenopathy noted in the abdomen or pelvis. Reproductive: Prostate gland and seminal vesicles are unremarkable in appearance. Other: No significant volume of ascites.  No pneumoperitoneum. Musculoskeletal: There are no aggressive appearing lytic or blastic lesions noted in the visualized portions of the skeleton. IMPRESSION: 1. No acute findings are noted in the abdomen or pelvis to account for the patient's symptoms. 2. Aortic atherosclerosis. Aortic Atherosclerosis (ICD10-I70.0). Electronically Signed   By: Trudie Reed M.D.   On: 07/23/2023 05:46   CT HEAD WO CONTRAST Result Date: 07/22/2023 CLINICAL DATA:   Increasing headaches, initial encounter EXAM: CT HEAD WITHOUT CONTRAST TECHNIQUE: Contiguous axial images were obtained from the base of the skull through the vertex without intravenous contrast. RADIATION DOSE REDUCTION: This exam was performed according to the departmental dose-optimization program which includes automated exposure control, adjustment of the mA and/or kV according to patient size and/or use of iterative reconstruction technique. COMPARISON:  10/15/2020 FINDINGS: Brain: No evidence of acute infarction, hemorrhage, hydrocephalus, extra-axial collection or mass lesion/mass effect. Mild atrophic changes are noted. Prior left frontal infarct is noted. Chronic white matter ischemic changes are seen as well Vascular: No hyperdense vessel or unexpected calcification. Skull: Normal. Negative for fracture or focal lesion. Sinuses/Orbits: No acute finding. Other: None. IMPRESSION: Chronic atrophic and ischemic changes.  No acute abnormality noted. Electronically Signed   By: Alcide Clever M.D.   On: 07/22/2023 23:54   DG Chest 2 View Result Date: 07/22/2023 CLINICAL DATA:  Chest pain. EXAM: CHEST - 2 VIEW COMPARISON:  07/04/2022, CT 07/05/2022 FINDINGS: Stable heart size and mediastinal contours. Chronic hyperinflation. Chronic lung disease with stable small opacity in the periphery of the right upper lung zone. Subpleural reticulation in a basilar predominant distribution. No acute airspace disease. No pleural effusion or pneumothorax. No acute osseous findings. IMPRESSION: Chronic lung disease.  No acute findings. Electronically Signed   By: Narda Rutherford M.D.   On: 07/22/2023 23:07    Procedures Procedures   Medications Ordered in ED Medications  ondansetron (ZOFRAN-ODT) disintegrating tablet 4 mg (4 mg Oral Given 07/22/23 2123)  sodium chloride 0.9 % bolus 1,000 mL (0 mLs Intravenous Stopped 07/23/23 0347)  ondansetron (ZOFRAN) injection 4 mg (4 mg Intravenous Given 07/23/23 0239)  iohexol  (OMNIPAQUE) 350 MG/ML injection 80 mL (80 mLs Intravenous Contrast Given 07/23/23 0418)    ED Course/ Medical Decision Making/ A&P Clinical Course as of 07/23/23 0600  Thu Jul 23, 2023  0518 Called radiology about the delay in read. Reports it should be read shortly.  [RR]    Clinical Course User Index [RR] Achille Rich, PA-C   Medical Decision Making Amount and/or Complexity of Data Reviewed Labs: ordered. Radiology: ordered.  Risk Prescription drug management. Decision regarding hospitalization.   83 y.o. male presents to the ER for evaluation of nausea, vomiting, pain, elevated blood pressure. Differential diagnosis includes but is not limited to ACS/MI, Boerhaave's, DKA, elevated ICP, Ischemic bowel, Sepsis, Drug-related (toxicity, THC hyperemesis, ETOH, withdrawal), Appendicitis, Bowel obstruction, Electrolyte abnormalities, Pancreatitis, Biliary colic, Gastroenteritis, Gastroparesis, Hepatitis, Migraine, Thyroid disease, Renal colic,  GERD/PUD, UTI. Vital signs mildly pressure 144/78, otherwise unremarkable.  Physical exam as noted above.   Labs and imaging initially ordered in triage.  I have added on additional images.  I independently reviewed and interpreted the patient's labs.  CMP shows significant hyponatremia at 123.  Glucose at 119.  No other electrolyte or LFT abnormality.  Urinalysis shows straw-colored urine with no signs of infection.  CBC does show hemoglobin of 8 with a mildly decreased hematocrit 25.1.  Patient's last hemoglobin was 9.4 a few months prior.  Mildly elevated PT and INR and APTT likely from the patient's Pradaxa.  COVID, flu, RSV negative.  Ethanol negative.  Lipase within normal limits.  Troponin within normal limits and flat at 16.  CXR shows Chronic lung disease.  No acute findings. Per radiologist's interpretation.    CT Head shows Chronic atrophic and ischemic changes.  No acute abnormality noted.  CT Abd Pelvis 1. No acute findings are noted in  the abdomen or pelvis to account for the patient's symptoms. 2. Aortic atherosclerosis. Aortic Atherosclerosis. Per radiologist's interpretation.    Given the patient significant hyponatremia, will need to be admitted.  This is likely hypovolemia hyponatremia.  I have ordered him a bolus of normal saline and placed him on a 125 mill per hour infusion.  CT of the head was ordered given the patient's hypertension.  He reported he had a momentary left-sided facial tingling that lasted maybe a few seconds and resolved.  Did not have any of the symptoms this morning.  Reports he has a frontal headache after having nausea and vomiting.  Not worse headache of his life.  Denies any visual changes.  Denies any trouble walking or talking but does feel more fatigued and dehydrated given his emesis.  I will lower station for any stroke given he does not have any focal neurodeficits.  He is not experiencing any dizziness or lightheadedness.  Does not have any vertiginous symptoms.  He is likely seeing the symptoms because of his low sodium.  He was recently taken off of his metoprolol due to low blood pressures and having some exertional shortness of breath.  The symptoms have resolved however now he does have some elevated blood pressure.  [facility] hospitalist to admit.  Portions of this report may have been transcribed using voice recognition software. Every effort was made to ensure accuracy; however, inadvertent computerized transcription errors may be present.   Final Clinical Impression(s) / ED Diagnoses Final diagnoses:  Nausea and vomiting, unspecified vomiting type  Hyponatremia  Lower abdominal pain  Elevated blood pressure reading    Rx / DC Orders ED Discharge Orders     None         Achille Rich, PA-C 07/23/23 [id number]    Shon Baton, MD 07/24/23 [phone]

## 2023-07-23 NOTE — ED Notes (Signed)
Pharmacy tech at bedside

## 2023-07-23 NOTE — H&P (Signed)
History and Physical    Patient: Kent [name] ZOX:[id number] DOB: 27-Jun-1940 DOA: 07/22/2023 DOS: the patient was seen and examined on 07/23/2023 PCP: Noberto Retort, MD  Patient coming from: Home  Chief Complaint:  Chief Complaint  Patient presents with   Hypertension   HPI: Kent [name] is a 83 y.o. male with medical history significant of hypertension, paroxysmal atrial fibrillation, CKD stage II, chronic pain, history of total abdominal colectomy in 2003 with ostomy reversal and ileorectal anastomosis in 2004, history of hemorrhoidectomy in 05/2023 presenting with nausea and vomiting found to have hyponatremia.  Reports that he was in his usual state of health until yesterday afternoon.  He began suddenly feeling nauseous and experienced nonbilious nonbloody vomiting.  After this episode, he continued to feel unwell and reports around 4-5 additional episodes of emesis though not as large as his first 1.  None of these episodes revealed any blood in his vomit.  He decided to come into the ED for further evaluation and reports feeling better after receiving Zofran last night.  He denies any fevers, chills, chest pain, palpitations, shortness of breath, cough, abdominal pain, diarrhea. He states that he usually drinks about 1 to 1-1/2 L of water a day.  Unable to state if he has had decreased urination over the past several days though feels that he has been urinating per his usual amount.  He has remained adherent with his home Pradaxa.  Of note, patient is not on any antihypertensive medications.  He states that he was on metoprolol XL in the past but this was discontinued by cardiology as he was experiencing shortness of breath with exertion.  His shortness of breath with exertion resolved after discontinuation of metoprolol.  Of note, patient reports that he did experience rectal bleeding 2 weeks ago.  States that he experienced about 5-6 episodes of dark red bleeding per rectum in a  single day.  He was advised by his surgeon to soak in the tub which resolved his symptoms.  He denies any subsequent episodes of rectal bleeding after that day.  He had a flex Sig done with Dr. Ewing Schlein in August 2024 which revealed hemorrhoids, otherwise normal rectum, rectosigmoid colon, and terminal ileum along with patent end-to-side ileorectal anastomosis.  He subsequently underwent hemorrhoidectomy in December 2024.  ED course: Vital signs stable aside from hypertension with blood pressures ranging in the 170s -190s/80s.  CBC without leukocytosis, but does show anemia with hgb 8.0 (hgb 9.4 about 2 months ago).  CMP with sodium 123, mild hyperglycemia, stable kidney function consistent with CKD stage II.  INR 1.4.  UA negative.  Troponins flat at 16 x 2.  CT head with chronic atrophic and ischemic changes, no acute abnormalities noted.  CT abdomen pelvis with contrast with no acute findings.  [facility] hospitalist asked to evaluate patient for admission.  Review of Systems: As mentioned in the history of present illness. All other systems reviewed and are negative. 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       Cancer (HCC)    basal cell cancer - skin   Chronic back pain    s/p back surgery   Dysrhythmia    GERD (gastroesophageal reflux disease) 08/18/2013   Incisional hernia, without obstruction or gangrene 02/02/2013   Postural dizziness with presyncope 09/01/2016   Sinus infection    currently on antibiotics   Small bowel obstruction due to adhesions (HCC)  Past Surgical History:  Procedure Laterality Date   BACK SURGERY  08/25/2001   Neck 3 disk replacements   BACK SURGERY     Placement/removal of nerve stimulator   CHOLECYSTECTOMY N/A 10/16/2014   Procedure: LAPAROSCOPIC CHOLECYSTECTOMY WITH ATTEMPTED CHOLANGIOGRAM;  Surgeon: Violeta Gelinas, MD;  Location: [facility] OR;  Service: General;  Laterality: N/A;   COLON SURGERY  04/2002   Dr. Orson Slick - Subtotal colectomy,  ileostomy   ELBOW SURGERY Right    for blood vessel & " hung" nerve   ESOPHAGEAL DILATION     EXPLORATORY LAPAROTOMY  03/10/2017   HEMORRHOID SURGERY N/A 05/25/2023   Procedure: HEMORRHOIDECTOMY INTERNAL;  Surgeon: Andria Meuse, MD;  Location: [facility] ORS;  Service: General;  Laterality: N/A;   HERNIA REPAIR     Ileostomy Takedown  08/2002   Dr. Orson Slick   INCISION AND DRAINAGE FOOT     INCISIONAL HERNIA REPAIR N/A 01/20/2013   Procedure: HERNIA REPAIR INCISIONAL;  Surgeon: Liz Malady, MD;  Location: [facility] OR;  Service: General;  Laterality: N/A;   INCISIONAL HERNIA REPAIR  04/13/2014   with mesh    dr Nelda Severe HERNIA REPAIR N/A 04/13/2014   Procedure: OPEN REPAIR INCISIONAL HERNIA ;  Surgeon: Violeta Gelinas, MD;  Location: [facility] OR;  Service: General;  Laterality: N/A;   INSERTION OF MESH N/A 04/13/2014   Procedure: INSERTION OF MESH;  Surgeon: Violeta Gelinas, MD;  Location: [facility] OR;  Service: General;  Laterality: N/A;   KNEE ARTHROSCOPY Right 2010   LAPAROSCOPIC CHOLECYSTECTOMY  10/16/2014   LAPAROTOMY N/A 01/20/2013   Procedure: EXPLORATORY LAPAROTOMY;  Surgeon: Liz Malady, MD;  Location: [facility] OR;  Service: General;  Laterality: N/A;   LAPAROTOMY N/A 03/10/2017   Procedure: EXPLORATORY LAPAROTOMY;  Surgeon: Violeta Gelinas, MD;  Location[facility];  Service: General;  Laterality: N/A;   Laparotomy with lysis of adhesions  2010   Dr. Janee Morn - [facility]   LYSIS OF ADHESION N/A 01/20/2013   Procedure: LYSIS OF ADHESION;  Surgeon: Liz Malady, MD;  Location: [facility] OR;  Service: General;  Laterality: N/A;   LYSIS OF ADHESION N/A 03/10/2017   Procedure: LYSIS OF ADHESION;  Surgeon: Violeta Gelinas, MD;  Location: [facility] OR;  Service: General;  Laterality: N/A;   microsurgical  03/24/2002   Lateral recess decompression L5   SACRAL NERVE STIMULATOR PLACEMENT     inserted and removed   SHOULDER ARTHROSCOPY Bilateral    VASECTOMY     Social History:  reports that he has quit smoking. He  quit smokeless tobacco use about 37 years ago.  His smokeless tobacco use included chew. He reports that he does not drink alcohol and does not use drugs.  Allergies  Allergen Reactions   Aspirin     dr said not to   Cephalexin Itching   Gabapentin Other (See Comments)    Unknown reaction    Naproxen Itching   Pregabalin     Unknown reaction    Hydrocodone-Acetaminophen Itching and Rash    Family History  Problem Relation Age of Onset   Heart disease Mother    Brain cancer Father     Prior to Admission medications   Medication Sig Start Date End Date Taking? Authorizing Provider  cholestyramine (QUESTRAN) 4 g packet Take 1 packet by mouth daily. 06/26/23  Yes [provider]  dabigatran (PRADAXA) 150 MG CAPS capsule Take 1 capsule (150 mg total) by mouth 2 (two) times daily. 10/15/22  Yes Nahser,  Deloris Ping, MD  loratadine-pseudoephedrine (CLARITIN-D 24 HOUR) 10-240 MG 24 hr tablet Take 1 tablet by mouth as needed for allergies.   Yes [provider]  morphine (MS CONTIN) 30 MG 12 hr tablet Take 30 mg by mouth 2 (two) times daily as needed for pain. 01/20/18  Yes [provider]    Physical Exam: Vitals:   07/23/23 0500 07/23/23 0635 07/23/23 0640 07/23/23 0815  BP: (!) 158/84 (!) 144/78  (!) 156/75  Pulse: 71 67  76  Resp: 16 18  18   Temp:   98.2 F (36.8 C)   TempSrc:   Oral   SpO2: 100% 100%  100%   Physical Exam Constitutional:      Appearance: Normal appearance. He is not ill-appearing.  HENT:     Head: Normocephalic and atraumatic.     Mouth/Throat:     Mouth: Mucous membranes are dry.     Pharynx: Oropharynx is clear. No oropharyngeal exudate.  Eyes:     General: No scleral icterus.    Extraocular Movements: Extraocular movements intact.     Conjunctiva/sclera: Conjunctivae normal.     Pupils: Pupils are equal, round, and reactive to light.  Cardiovascular:     Rate and Rhythm: Normal rate and regular rhythm.     Pulses: Normal  pulses.     Heart sounds: Normal heart sounds. No murmur heard.    No friction rub. No gallop.  Pulmonary:     Effort: Pulmonary effort is normal. No respiratory distress.     Breath sounds: Normal breath sounds. No wheezing, rhonchi or rales.  Abdominal:     General: Bowel sounds are normal. There is no distension.     Palpations: Abdomen is soft.     Tenderness: There is no abdominal tenderness. There is no guarding or rebound.  Musculoskeletal:        General: No swelling. Normal range of motion.     Cervical back: Normal range of motion.  Skin:    General: Skin is warm and dry.  Neurological:     General: No focal deficit present.     Mental Status: He is alert and oriented to person, place, and time.  Psychiatric:        Mood and Affect: Mood normal.        Behavior: Behavior normal.     Data Reviewed:  There are no new results to review at this time.     Latest Ref Rng & Units 07/22/2023    9:07 PM 07/22/2023    9:03 PM 05/12/2023    1:31 PM  CBC  WBC 4.0 - 10.5 K/uL  4.8  4.4   Hemoglobin 13.0 - 17.0 g/dL 9.2  8.0  9.4   Hematocrit 39.0 - 52.0 % 27.0  25.1  26.8   Platelets 150 - 400 K/uL  252  416       Latest Ref Rng & Units 07/23/2023    6:40 AM 07/22/2023    9:07 PM 07/22/2023    9:03 PM  BMP  Glucose 70 - 99 mg/dL 98  865  784   BUN 8 - 23 mg/dL 15  16  16    Creatinine 0.61 - 1.24 mg/dL 6.96  2.95  2.84   Sodium 135 - 145 mmol/L 126  124  123   Potassium 3.5 - 5.1 mmol/L 4.0  4.2  4.0   Chloride 98 - 111 mmol/L 94  90  90   CO2 22 - 32  mmol/L 24   22   Calcium 8.9 - 10.3 mg/dL 8.8   9.0    CT ABDOMEN PELVIS W CONTRAST Result Date: 07/23/2023 CLINICAL DATA:  83 year old male with history of acute onset of nonlocalized abdominal pain. EXAM: CT ABDOMEN AND PELVIS WITH CONTRAST TECHNIQUE: Multidetector CT imaging of the abdomen and pelvis was performed using the standard protocol following bolus administration of intravenous contrast. RADIATION DOSE  REDUCTION: This exam was performed according to the departmental dose-optimization program which includes automated exposure control, adjustment of the mA and/or kV according to patient size and/or use of iterative reconstruction technique. CONTRAST:  80mL OMNIPAQUE IOHEXOL 350 MG/ML SOLN COMPARISON:  CT of the abdomen and pelvis 08/19/2022. FINDINGS: Lower chest: Atherosclerotic calcifications are noted in the descending thoracic aorta. Hepatobiliary: No suspicious cystic or solid hepatic lesions. No intra or extrahepatic biliary ductal dilatation. Status post cholecystectomy. Pancreas: No pancreatic mass. No pancreatic ductal dilatation. No pancreatic or peripancreatic fluid collections or inflammatory changes. Spleen: Unremarkable. Adrenals/Urinary Tract: Bilateral kidneys and bilateral adrenal glands are normal in appearance. No hydroureteronephrosis. Urinary bladder is moderately distended, but otherwise unremarkable in appearance. Stomach/Bowel: The appearance of the stomach is normal. There is no pathologic dilatation of small bowel or colon. Postoperative changes of subtotal colectomy are redemonstrated. Vascular/Lymphatic: Atherosclerotic calcifications are noted in the abdominal aorta and pelvic vasculature. No lymphadenopathy noted in the abdomen or pelvis. Reproductive: Prostate gland and seminal vesicles are unremarkable in appearance. Other: No significant volume of ascites.  No pneumoperitoneum. Musculoskeletal: There are no aggressive appearing lytic or blastic lesions noted in the visualized portions of the skeleton. IMPRESSION: 1. No acute findings are noted in the abdomen or pelvis to account for the patient's symptoms. 2. Aortic atherosclerosis. Aortic Atherosclerosis (ICD10-I70.0). Electronically Signed   By: Trudie Reed M.D.   On: 07/23/2023 05:46   CT HEAD WO CONTRAST Result Date: 07/22/2023 CLINICAL DATA:  Increasing headaches, initial encounter EXAM: CT HEAD WITHOUT CONTRAST  TECHNIQUE: Contiguous axial images were obtained from the base of the skull through the vertex without intravenous contrast. RADIATION DOSE REDUCTION: This exam was performed according to the departmental dose-optimization program which includes automated exposure control, adjustment of the mA and/or kV according to patient size and/or use of iterative reconstruction technique. COMPARISON:  10/15/2020 FINDINGS: Brain: No evidence of acute infarction, hemorrhage, hydrocephalus, extra-axial collection or mass lesion/mass effect. Mild atrophic changes are noted. Prior left frontal infarct is noted. Chronic white matter ischemic changes are seen as well Vascular: No hyperdense vessel or unexpected calcification. Skull: Normal. Negative for fracture or focal lesion. Sinuses/Orbits: No acute finding. Other: None. IMPRESSION: Chronic atrophic and ischemic changes.  No acute abnormality noted. Electronically Signed   By: Alcide Clever M.D.   On: 07/22/2023 23:54   DG Chest 2 View Result Date: 07/22/2023 CLINICAL DATA:  Chest pain. EXAM: CHEST - 2 VIEW COMPARISON:  07/04/2022, CT 07/05/2022 FINDINGS: Stable heart size and mediastinal contours. Chronic hyperinflation. Chronic lung disease with stable small opacity in the periphery of the right upper lung zone. Subpleural reticulation in a basilar predominant distribution. No acute airspace disease. No pleural effusion or pneumothorax. No acute osseous findings. IMPRESSION: Chronic lung disease.  No acute findings. Electronically Signed   By: Narda Rutherford M.D.   On: 07/22/2023 23:07     Assessment and Plan: No notes have been filed under this hospital service. Service: Hospitalist  Acute moderate symptomatic hypovolemic hyponatremia Patient presenting with nausea and multiple episodes of NBNB emesis.  Lab work  showing initial sodium of 123.  He was dry on exam and subsequently administered IV normal saline.  He did have some improvement in his sodium to 126 this  morning.  He states he feels much better than earlier, but notes intermittent nausea without any further episodes of emesis.  Urine awesome 314, urine sodium 48.  He is not on any diuretics at home and does not have any history of adrenal insufficiency.  Given his hyponatremia is saline responsive, will continue with IV fluid rehydration and trend sodium.  If no improvement on next sodium check, can consider 100 mL bolus of 3% saline. -Continue IV fluids -Trend sodium -Consider 100 mL bolus of 3% saline if no improvement in sodium on next recheck -Place in observation/progressive  HTN Not on any home antihypertensives currently. BP initially ranging 170s-190s/90s in the ED, improved to 140s-150s/70s without any acute intervention. Unclear if his significant HTN on arrival contributed to nausea and vomiting.  I did start him on Norvasc 5 mg daily earlier this morning with improvement in his blood pressure to 130s/70s.. If becomes uncontrolled, can consider coreg or ACEI/ARB for further BP reduction.  Despite elevated BP on arrival, I do suspect that his nausea and vomiting is more from symptomatic hyponatremia as opposed to from uncontrolled hypertension. -start norvasc 5mg  daily -trend BP curve -consider adding coreg or ACEI/ARB if remains uncontrolled  Acute on chronic anemia History of hemorrhoids status post hemorrhoidectomy (05/2023) History of total abdominal colectomy (2003) with subsequent reversal and ileorectal anastomosis (2004) Patient with an extensive past GI surgical history.  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  He underwent subsequent reversal with ileorectal anastomosis in 2004 with Dr. Orson Slick.  He was also noted to have recurrent small bowel obstructions and required ex lap with lysis of adhesions in 2014.  He also underwent hemorrhoidectomy in December 2024 with Dr. Cliffton Asters.  It appears that patient has had follow-up with Dr. Ewing Schlein in the past.  He  underwent a flex Sig in August 2024 showing hemorrhoids, but otherwise showed a normal rectum, rectosigmoid colon, terminal ileum, and a patent ileorectal anastomosis.  Patient's hemoglobin level was about 12.7 about a year ago.  2 months ago, hemoglobin noted to have down trended to 9.4.  Since that time he has undergone hemorrhoidectomy.  He reports 1 day history of rectal bleeding that occurred 2 weeks ago where he experienced about 5-6 episodes of dark red bleeding.  This is not since recurred.  Hemoglobin on admission was 8.0.  Given his past GI surgical history, I have consulted Dr. Lorenso Quarry with [facility].  She recommends continuation of Pradaxa given no active bleeding and GI will see tomorrow.  Likely no plans for inpatient colonoscopy, though may arrange for outpatient colonoscopy given extent of anemia. -[facility] consulted, appreciate assistance -Recommended continuation of Pradaxa given patient is not having any active bleeding -Follow-up H&H -Transfuse if hemoglobin less than 7  Paroxysmal atrial fibrillation Reports that he was previously on metoprolol tartrate 25 mg twice daily per cardiology.  However, he experienced shortness of breath with exertion and cardiology recommended holding his metoprolol to tartrate.  His shortness of breath did resolve after discontinuation. He follows Dr. Elease Hashimoto with cardiology.  EKG on admission showing normal sinus rhythm with first-degree AV block. -resume home pradaxa BID  Stage 2 CKD Baseline creatinine around 1.2, consistent with CKD stage II. On admission, Cr 1.18. -kidney function stable  Chronic pain Patient on morphine  ER 30 mg every 12 hours as needed at home.  PDMP reviewed and appropriate. -Resume home morphine ER 30 mg every 12 hours as needed   Advance Care Planning:   Code Status: Full Code   Consults: none  Family Communication: updated spouse at bedside  Severity of Illness: The appropriate patient status for this patient is  OBSERVATION. Observation status is judged to be reasonable and necessary in order to provide the required intensity of service to ensure the patient's safety. The patient's presenting symptoms, physical exam findings, and initial radiographic and laboratory data in the context of their medical condition is felt to place them at decreased risk for further clinical deterioration. Furthermore, it is anticipated that the patient will be medically stable for discharge from the hospital within 2 midnights of admission.   Portions of this note were generated with Scientist, clinical (histocompatibility and immunogenetics). Dictation errors may occur despite best attempts at proofreading.   Author: Briscoe Burns, MD 07/23/2023 10:02 AM  For on call review [email or web address].

## 2023-07-23 NOTE — Progress Notes (Signed)
Patient's RN reported that patient has persistent nausea and vomiting which is not improving with Zofran.  Ordered Compazine for refractory vomiting. - Continue to check BMP every 4 hours and started NS 50 cc/h.  Will continue to trend sodium level and will adjust infusion as needed.

## 2023-07-23 NOTE — Progress Notes (Signed)
Patient received to unit at this time.  No s/s of distress noted.  Patient place on [id number] and telemetry made aware, SR on monitor.  On Room air.  PIV to left ac blood return noted and saline locked at this time.  Mild complaints of dizziness when ambulated to bed and still has complaints of nausea.  MD made aware for PRN meds.  Called and asked lab to come and draw labs that was due from 1400 from the emergency department, stated they would have someone come up.   Wife at bedside.

## 2023-07-24 DIAGNOSIS — I129 Hypertensive chronic kidney disease with stage 1 through stage 4 chronic kidney disease, or unspecified chronic kidney disease: Secondary | ICD-10-CM | POA: Diagnosis present

## 2023-07-24 DIAGNOSIS — Z886 Allergy status to analgesic agent status: Secondary | ICD-10-CM | POA: Diagnosis not present

## 2023-07-24 DIAGNOSIS — M1909 Primary osteoarthritis, other specified site: Secondary | ICD-10-CM | POA: Diagnosis present

## 2023-07-24 DIAGNOSIS — R739 Hyperglycemia, unspecified: Secondary | ICD-10-CM | POA: Diagnosis present

## 2023-07-24 DIAGNOSIS — Z9049 Acquired absence of other specified parts of digestive tract: Secondary | ICD-10-CM | POA: Diagnosis not present

## 2023-07-24 DIAGNOSIS — Z881 Allergy status to other antibiotic agents status: Secondary | ICD-10-CM | POA: Diagnosis not present

## 2023-07-24 DIAGNOSIS — Z7902 Long term (current) use of antithrombotics/antiplatelets: Secondary | ICD-10-CM | POA: Diagnosis not present

## 2023-07-24 DIAGNOSIS — N182 Chronic kidney disease, stage 2 (mild): Secondary | ICD-10-CM | POA: Diagnosis present

## 2023-07-24 DIAGNOSIS — Z8249 Family history of ischemic heart disease and other diseases of the circulatory system: Secondary | ICD-10-CM | POA: Diagnosis not present

## 2023-07-24 DIAGNOSIS — I440 Atrioventricular block, first degree: Secondary | ICD-10-CM | POA: Diagnosis present

## 2023-07-24 DIAGNOSIS — Z885 Allergy status to narcotic agent status: Secondary | ICD-10-CM | POA: Diagnosis not present

## 2023-07-24 DIAGNOSIS — Z808 Family history of malignant neoplasm of other organs or systems: Secondary | ICD-10-CM | POA: Diagnosis not present

## 2023-07-24 DIAGNOSIS — Z79899 Other long term (current) drug therapy: Secondary | ICD-10-CM | POA: Diagnosis not present

## 2023-07-24 DIAGNOSIS — D62 Acute posthemorrhagic anemia: Secondary | ICD-10-CM | POA: Diagnosis present

## 2023-07-24 DIAGNOSIS — E871 Hypo-osmolality and hyponatremia: Principal | ICD-10-CM

## 2023-07-24 DIAGNOSIS — Z23 Encounter for immunization: Secondary | ICD-10-CM | POA: Diagnosis present

## 2023-07-24 DIAGNOSIS — J3489 Other specified disorders of nose and nasal sinuses: Secondary | ICD-10-CM | POA: Diagnosis present

## 2023-07-24 DIAGNOSIS — K219 Gastro-esophageal reflux disease without esophagitis: Secondary | ICD-10-CM | POA: Diagnosis present

## 2023-07-24 DIAGNOSIS — I10 Essential (primary) hypertension: Secondary | ICD-10-CM

## 2023-07-24 DIAGNOSIS — Z85828 Personal history of other malignant neoplasm of skin: Secondary | ICD-10-CM | POA: Diagnosis not present

## 2023-07-24 DIAGNOSIS — M549 Dorsalgia, unspecified: Secondary | ICD-10-CM | POA: Diagnosis present

## 2023-07-24 DIAGNOSIS — I480 Paroxysmal atrial fibrillation: Secondary | ICD-10-CM | POA: Diagnosis present

## 2023-07-24 DIAGNOSIS — I700 Atherosclerosis of aorta: Secondary | ICD-10-CM | POA: Diagnosis present

## 2023-07-24 DIAGNOSIS — D509 Iron deficiency anemia, unspecified: Secondary | ICD-10-CM | POA: Diagnosis not present

## 2023-07-24 DIAGNOSIS — G8929 Other chronic pain: Secondary | ICD-10-CM | POA: Diagnosis present

## 2023-07-24 DIAGNOSIS — E861 Hypovolemia: Secondary | ICD-10-CM | POA: Diagnosis present

## 2023-07-24 LAB — BASIC METABOLIC PANEL
Anion gap: 6 (ref 5–15)
Anion gap: 4 — ABNORMAL LOW (ref 5–15)
Anion gap: 9 (ref 5–15)
Anion gap: 6 (ref 5–15)
BUN: 14 mg/dL (ref 8–23)
BUN: 16 mg/dL (ref 8–23)
BUN: 14 mg/dL (ref 8–23)
BUN: 18 mg/dL (ref 8–23)
CO2: 24 mmol/L (ref 22–32)
CO2: 24 mmol/L (ref 22–32)
CO2: 24 mmol/L (ref 22–32)
CO2: 25 mmol/L (ref 22–32)
Calcium: 8.5 mg/dL — ABNORMAL LOW (ref 8.9–10.3)
Calcium: 8.5 mg/dL — ABNORMAL LOW (ref 8.9–10.3)
Calcium: 8.7 mg/dL — ABNORMAL LOW (ref 8.9–10.3)
Calcium: 8.5 mg/dL — ABNORMAL LOW (ref 8.9–10.3)
Chloride: 98 mmol/L (ref 98–111)
Chloride: 101 mmol/L (ref 98–111)
Chloride: 98 mmol/L (ref 98–111)
Chloride: 100 mmol/L (ref 98–111)
Creatinine, Ser: 1.26 mg/dL — ABNORMAL HIGH (ref 0.61–1.24)
Creatinine, Ser: 1.45 mg/dL — ABNORMAL HIGH (ref 0.61–1.24)
Creatinine, Ser: 1.4 mg/dL — ABNORMAL HIGH (ref 0.61–1.24)
Creatinine, Ser: 1.96 mg/dL — ABNORMAL HIGH (ref 0.61–1.24)
GFR, Estimated: 58 mL/min — ABNORMAL LOW (ref >60)
GFR, Estimated: 49 mL/min — ABNORMAL LOW (ref >60)
GFR, Estimated: 51 mL/min — ABNORMAL LOW (ref >60)
GFR, Estimated: 34 mL/min — ABNORMAL LOW (ref >60)
Glucose, Bld: 142 mg/dL — ABNORMAL HIGH (ref 70–99)
Glucose, Bld: 145 mg/dL — ABNORMAL HIGH (ref 70–99)
Glucose, Bld: 158 mg/dL — ABNORMAL HIGH (ref 70–99)
Glucose, Bld: 100 mg/dL — ABNORMAL HIGH (ref 70–99)
Potassium: 4 mmol/L (ref 3.5–5.1)
Potassium: 4 mmol/L (ref 3.5–5.1)
Potassium: 3.5 mmol/L (ref 3.5–5.1)
Potassium: 4.2 mmol/L (ref 3.5–5.1)
Sodium: 128 mmol/L — ABNORMAL LOW (ref 135–145)
Sodium: 129 mmol/L — ABNORMAL LOW (ref 135–145)
Sodium: 131 mmol/L — ABNORMAL LOW (ref 135–145)
Sodium: 131 mmol/L — ABNORMAL LOW (ref 135–145)

## 2023-07-24 LAB — CBC
HCT: 23.1 % — ABNORMAL LOW (ref 39.0–52.0)
Hemoglobin: 7.5 g/dL — ABNORMAL LOW (ref 13.0–17.0)
MCH: 25.6 pg — ABNORMAL LOW (ref 26.0–34.0)
MCHC: 32.5 g/dL (ref 30.0–36.0)
MCV: 78.8 fL — ABNORMAL LOW (ref 80.0–100.0)
Platelets: 233 10*3/uL (ref 150–400)
RBC: 2.93 MIL/uL — ABNORMAL LOW (ref 4.22–5.81)
RDW: 14.2 % (ref 11.5–15.5)
WBC: 3.9 10*3/uL — ABNORMAL LOW (ref 4.0–10.5)
nRBC: 0 % (ref 0.0–0.2)

## 2023-07-24 MED ORDER — SODIUM CHLORIDE 0.9 % IV SOLN: INTRAVENOUS | Status: DC

## 2023-07-24 MED ORDER — INFLUENZA VAC A&B SURF ANT ADJ 0.5 ML IM SUSY
0.5000 mL | PREFILLED_SYRINGE | INTRAMUSCULAR | Status: AC
  Administered 2023-07-25: 0.5 mL via INTRAMUSCULAR
  Filled 2023-07-24: qty 0.5

## 2023-07-24 NOTE — Progress Notes (Signed)
Progress Note    Kent [name]   AOZ:[id number]  DOB: [birth year]  DOA: 07/22/2023     0 PCP: Noberto Retort, MD  Initial CC: N/V  Hospital Course: Mr. 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   After vomiting episodes he presented for further workup.  Further lab workup revealed Na 123 and he was also started on IVF.   He's also had recent rectal bleeding 2 weeks ago with known history of hemorrhoids and underwent hemorrhoidectomy in Dec 2024.  He remains on pradaxa for PAF.  CT A/P performed on admission was negative for acute findings.  Hgb was 8 g/dL on admission down from prior baseline around 12-13 g/dL.  Case was discussed with GI and he was admitted for further monitoring.   Interval History:  Patient endorsing feeling much better when seen this morning.  No further nausea or vomiting since admission.  He states the rectal bleeding he had was approximately 1 week ago and resolved after the soaking in the tub. He endorsed some upper abdominal pain from all of the vomiting.  Assessment and Plan: * Hyponatremia - suspect hypovolemic hyponatremia from vomiting/GI losses - serum osmo 271 and urine Na 48, with urine osmo 314 - has responded to IVF so continue for now; rate of increase appropriate - continue trending BMP q4h for now  Microcytic anemia - suspected due to acute blood loss from recent rectal bleeding; hx hemorrhoidectomy Dec 2024 - Hgb noted 12.7 g/dL approx 1 year ago and presented with Hgb 8 g/dL - check iron studies - GI consulted on admission - continued on Pradaxa for now - continue trending Hgb   HTN (hypertension) - some hypoTN this am - hold amlodipine   Atrial fibrillation (HCC) - continue pradaxa for now as per GI - no  longer on metoprolol due to SOB which resolved after discontinuation - continue on tele   Chronic back pain Patient on morphine ER 30 mg every 12 hours as needed at home.  PDMP reviewed and appropriate. -Resume home morphine ER 30 mg every 12 hours as needed   Old records reviewed in assessment of this patient  Antimicrobials:   DVT prophylaxis:   dabigatran (PRADAXA) capsule 150 mg   Code Status:   Code Status: Full Code  Mobility Assessment (Last 72 Hours)     Mobility Assessment     Row Name 07/23/23 2048           Does patient have an order for bedrest or is patient medically unstable No - Continue assessment       What is the highest level of mobility based on the progressive mobility assessment? Level 6 (Walks independently in room and hall) - Balance while walking in room without assist - Complete                Barriers to discharge: none Disposition Plan: Home HH orders placed: N/A Status is: Inpatient  Objective: Blood pressure 109/75, pulse 67, temperature 97.6 F (36.4 C), temperature source Oral, resp. rate 19, height 5\' 10"  (1.778 m), weight 71 kg, SpO2 100%.  Examination:  Physical Exam Constitutional:      General: He is not in acute distress.    Appearance: Normal appearance.  HENT:     Head: Normocephalic and  atraumatic.     Mouth/Throat:     Mouth: Mucous membranes are moist.  Eyes:     Extraocular Movements: Extraocular movements intact.  Cardiovascular:     Rate and Rhythm: Normal rate and regular rhythm.  Pulmonary:     Effort: Pulmonary effort is normal. No respiratory distress.     Breath sounds: Normal breath sounds. No wheezing.  Abdominal:     General: Bowel sounds are normal. There is no distension.     Palpations: Abdomen is soft.     Tenderness: There is no abdominal tenderness.  Musculoskeletal:        General: Normal range of motion.     Cervical back: Normal range of motion and neck supple.  Skin:    General: Skin  is warm and dry.  Neurological:     General: No focal deficit present.     Mental Status: He is alert.  Psychiatric:        Mood and Affect: Mood normal.        Behavior: Behavior normal.      Consultants:  GI-signed off  Procedures:    Data Reviewed: Results for orders placed or performed during the hospital encounter of 07/22/23 (from the past 24 hours)  Sodium     Status: Abnormal   Collection Time: 07/23/23  7:09 PM  Result Value Ref Range   Sodium 128 (L) 135 - 145 mmol/L  Hemoglobin and hematocrit, blood     Status: Abnormal   Collection Time: 07/23/23  7:09 PM  Result Value Ref Range   Hemoglobin 8.1 (L) 13.0 - 17.0 g/dL   HCT 11.9 (L) 14.7 - 82.9 %  CBC     Status: Abnormal   Collection Time: 07/24/23 12:07 AM  Result Value Ref Range   WBC 3.9 (L) 4.0 - 10.5 K/uL   RBC 2.93 (L) 4.22 - 5.81 MIL/uL   Hemoglobin 7.5 (L) 13.0 - 17.0 g/dL   HCT 56.2 (L) 13.0 - 86.5 %   MCV 78.8 (L) 80.0 - 100.0 fL   MCH 25.6 (L) 26.0 - 34.0 pg   MCHC 32.5 30.0 - 36.0 g/dL   RDW 78.4 69.6 - 29.5 %   Platelets 233 150 - 400 K/uL   nRBC 0.0 0.0 - 0.2 %  Basic metabolic panel     Status: Abnormal   Collection Time: 07/24/23 12:07 AM  Result Value Ref Range   Sodium 128 (L) 135 - 145 mmol/L   Potassium 4.0 3.5 - 5.1 mmol/L   Chloride 98 98 - 111 mmol/L   CO2 24 22 - 32 mmol/L   Glucose, Bld 142 (H) 70 - 99 mg/dL   BUN 14 8 - 23 mg/dL   Creatinine, Ser 2.84 (H) 0.61 - 1.24 mg/dL   Calcium 8.5 (L) 8.9 - 10.3 mg/dL   GFR, Estimated 58 (L) >60 mL/min   Anion gap 6 5 - 15  Basic metabolic panel     Status: Abnormal   Collection Time: 07/24/23  4:15 AM  Result Value Ref Range   Sodium 129 (L) 135 - 145 mmol/L   Potassium 4.0 3.5 - 5.1 mmol/L   Chloride 101 98 - 111 mmol/L   CO2 24 22 - 32 mmol/L   Glucose, Bld 145 (H) 70 - 99 mg/dL   BUN 16 8 - 23 mg/dL   Creatinine, Ser 1.32 (H) 0.61 - 1.24 mg/dL   Calcium 8.5 (L) 8.9 - 10.3 mg/dL   GFR, Estimated 49 (L) >60  mL/min   Anion  gap 4 (L) 5 - 15  Basic metabolic panel     Status: Abnormal   Collection Time: 07/24/23  9:08 AM  Result Value Ref Range   Sodium 131 (L) 135 - 145 mmol/L   Potassium 3.5 3.5 - 5.1 mmol/L   Chloride 98 98 - 111 mmol/L   CO2 24 22 - 32 mmol/L   Glucose, Bld 158 (H) 70 - 99 mg/dL   BUN 14 8 - 23 mg/dL   Creatinine, Ser 6.96 (H) 0.61 - 1.24 mg/dL   Calcium 8.7 (L) 8.9 - 10.3 mg/dL   GFR, Estimated 51 (L) >60 mL/min   Anion gap 9 5 - 15    I have reviewed pertinent nursing notes, vitals, labs, and images as necessary. I have ordered labwork to follow up on as indicated.  I have reviewed the last notes from staff over past 24 hours. I have discussed patient's care plan and test results with nursing staff, CM/SW, and other staff as appropriate.  Time spent: Greater than 50% of the 55 minute visit was spent in counseling/coordination of care for the patient as laid out in the A&P.   LOS: 0 days   Lewie Chamber, MD [facility] 07/24/2023, 12:58 PM

## 2023-07-24 NOTE — Plan of Care (Signed)

## 2023-07-24 NOTE — Assessment & Plan Note (Signed)
-   some hypoTN this am - hold amlodipine

## 2023-07-24 NOTE — Assessment & Plan Note (Signed)
Patient on morphine ER 30 mg every 12 hours as needed at home.  PDMP reviewed and appropriate. -Resume home morphine ER 30 mg every 12 hours as needed

## 2023-07-24 NOTE — Plan of Care (Signed)

## 2023-07-24 NOTE — Assessment & Plan Note (Signed)
-   suspect hypovolemic hyponatremia from vomiting/GI losses - serum osmo 271 and urine Na 48, with urine osmo 314 - has responded to IVF so continue for now; rate of increase appropriate - continue trending BMP q4h for now

## 2023-07-24 NOTE — Hospital Course (Addendum)
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   After vomiting episodes he presented for further workup.  Further lab workup revealed Na 123 and he was also started on IVF.   He's also had recent rectal bleeding 2 weeks ago with known history of hemorrhoids and underwent hemorrhoidectomy in Dec 2024.  He remains on pradaxa for PAF.  CT A/P performed on admission was negative for acute findings.  Hgb was 8 g/dL on admission down from prior baseline around 12-13 g/dL.  Case was discussed with GI and he was admitted for further monitoring.

## 2023-07-24 NOTE — Assessment & Plan Note (Signed)
-   continue pradaxa for now as per GI - no longer on metoprolol due to SOB which resolved after discontinuation - continue on tele

## 2023-07-24 NOTE — TOC Initial Note (Signed)
Transition of Care (TOC) - Initial/Assessment Note  Patient admitted for hyponatremia  from home. Lives with spouse and son (50y). Has DME (cane). Patient states drives short distances due to left eye issues. Wife assist with transport when needed. Denies needs for Home Health. At present no financial concerns for medications. No further needs identified from Case Manager at this time.  Patient Details  Name: Kent [name] MRN: [id number] Date of Birth: 1941-01-24  Transition of Care [facility]) CM/SW Contact:    Jessie Foot, RN Phone Number: 07/24/2023, 3:20 PM  Clinical Narrative:                   Expected Discharge Plan: Home/Self Care Barriers to Discharge: No Barriers Identified   Patient Goals and CMS Choice Patient states their goals for this hospitalization and ongoing recovery are:: Return to home          Expected Discharge Plan and Services In-house Referral: NA Discharge Planning Services: NA   Living arrangements for the past 2 months: Single Family Home                           HH Arranged: NA          Prior Living Arrangements/Services Living arrangements for the past 2 months: Single Family Home Lives with:: Spouse, Adult Children Patient language and need for interpreter reviewed:: Yes Do you feel safe going back to the place where you live?: Yes      Need for Family Participation in Patient Care: No (Comment) Care giver support system in place?: Yes (comment) Current home services: DME Criminal Activity/Legal Involvement Pertinent to Current Situation/Hospitalization: No - Comment as needed  Activities of Daily Living   ADL Screening (condition at time of admission) Independently performs ADLs?: Yes (appropriate for developmental age) Is the patient deaf or have difficulty hearing?: No Does the patient have difficulty seeing, even when wearing glasses/contacts?: No Does the patient have difficulty concentrating, remembering, or making  decisions?: No  Permission Sought/Granted Permission sought to share information with : Case Manager, Magazine features editor Permission granted to share information with : Yes, Verbal Permission Granted              Emotional Assessment Appearance:: Appears stated age Attitude/Demeanor/Rapport: Engaged Affect (typically observed): Pleasant Orientation: : Oriented to Self, Oriented to Place, Oriented to  Time, Oriented to Situation Alcohol / Substance Use: Not Applicable Psych Involvement: No (comment)  Admission diagnosis:  Hyponatremia [E87.1] Elevated blood pressure reading [R03.0] Lower abdominal pain [R10.30] Nausea and vomiting, unspecified vomiting type [R11.2] Patient Active Problem List   Diagnosis Date Noted   HTN (hypertension) 07/24/2023   Microcytic anemia 07/24/2023   Hyponatremia 07/23/2023   Aortic atherosclerosis (HCC) 10/16/2022   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) 08/26/2022   Atrial fibrillation (HCC) 07/05/2022   AKI (acute kidney injury) (HCC) 07/04/2022   Pulmonary nodule 07/04/2022   COVID-19 virus infection 07/04/2022   PAF (paroxysmal atrial fibrillation) (HCC) 02/26/2019   S/P exploratory laparotomy 03/10/2017   Elevated blood pressure reading 12/30/2016   Nonspecific abnormal electrocardiogram (ECG) (EKG) 09/01/2016   History of colectomy 02/13/2016   Intractable abdominal pain 02/13/2016   S/P laparoscopic cholecystectomy 10/16/2014   S/P hernia repair 04/13/2014   Pre-op evaluation 03/24/2014   H/O abdominal surgery 08/18/2013   GERD (gastroesophageal reflux disease) 08/18/2013   Chronic back pain 08/18/2013   SBO (small bowel obstruction) (HCC) 08/12/2013  PCP:  Noberto Retort, MD Pharmacy:   [facility] [phone] - [place], [place] - [address] AT [address]NER OF [address] [place] [address] Phone: [phone] Fax: [phone]     Social Drivers of  Health (SDOH) Social History: SDOH Screenings   Food Insecurity: No Food Insecurity (07/24/2023)  Housing: Low Risk  (07/24/2023)  Transportation Needs: No Transportation Needs (07/24/2023)  Utilities: Not At Risk (07/24/2023)  Social Connections: Moderately Integrated (07/24/2023)  Tobacco Use: Medium Risk (07/22/2023)   SDOH Interventions:     Readmission Risk Interventions     No data to display

## 2023-07-24 NOTE — Consult Note (Signed)
[facility] [place] [facility]enterology Consult  Referring Provider: No ref. provider found Primary Care Physician:  Noberto Retort, MD Primary Gastroenterologist: [facility] - Dr. Ewing Schlein  Reason for Consultation: hematochezia, anemia  SUBJECTIVE:   HPI: Kent [name] is a 83 y.o. male with past medical history significant for subtotal colectomy in 2003 secondary to post-surgical complication with end-to-side ileocolonic anastomosis, paroxysmal atrial fibrillation, hypertension, chronic kidney disease. Underwent hemorrhoidectomy in December 2024. Presented to hospital on 07/22/23 with chief complaint of nausea, vomiting, elevated blood pressure.   Today, noted that he was previously experiencing dark red, painless blood per rectum few weeks prior. He reached out to his surgeon who instructed him to utilize Sitz bath and hematochezia resolved, he is having no current hematochezia. He denied abdominal pain. He had nausea, non-bloody emesis and abdominal pain last evening, though today is resolved and he was able to tolerate breakfast. No chest pain or shortness of breath. Noted that he is using cholestyramine which resolves diarrhea (likely from subtotal colectomy).   Hgb 7.5 this AM, Hgb on 05/22/23 was 9.4, Hgb on 07/04/22 was 12.7. WBC 4.8. PLT 252.  CT scan of abdomen/pelvis completed on 07/23/23 showed stomach within normal limits, no dilatation of the small  bowel/colon, post-operative changes of subtotal colectomy.   Flexible sigmoidoscopy completed on 10/20/22 (Dr. Ewing Schlein) showed hemorrhoids, rectum/rectosigmoid within normal limits, healthy appearing end-to-side ileocolonic anastomosis, normal appearing ileum.   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       Cancer (HCC)    basal cell cancer - skin   Chronic back pain    s/p back surgery   Dysrhythmia    GERD (gastroesophageal reflux disease) 08/18/2013   Incisional hernia, without obstruction or gangrene 02/02/2013    Postural dizziness with presyncope 09/01/2016   Sinus infection    currently on antibiotics   Small bowel obstruction due to adhesions [facility])    Past Surgical History:  Procedure Laterality Date   BACK SURGERY  08/25/2001   Neck 3 disk replacements   BACK SURGERY     Placement/removal of nerve stimulator   CHOLECYSTECTOMY N/A 10/16/2014   Procedure: LAPAROSCOPIC CHOLECYSTECTOMY WITH ATTEMPTED CHOLANGIOGRAM;  Surgeon: Violeta Gelinas, MD;  Location: [facility] OR;  Service: General;  Laterality: N/A;   COLON SURGERY  04/2002   Dr. Orson Slick - Subtotal colectomy, ileostomy   ELBOW SURGERY Right    for blood vessel & " hung" nerve   ESOPHAGEAL DILATION     EXPLORATORY LAPAROTOMY  03/10/2017   HEMORRHOID SURGERY N/A 05/25/2023   Procedure: HEMORRHOIDECTOMY INTERNAL;  Surgeon: Andria Meuse, MD;  Location: [facility] ORS;  Service: General;  Laterality: N/A;   HERNIA REPAIR     Ileostomy Takedown  08/2002   Dr. Orson Slick   INCISION AND DRAINAGE FOOT     INCISIONAL HERNIA REPAIR N/A 01/20/2013   Procedure: HERNIA REPAIR INCISIONAL;  Surgeon: Liz Malady, MD;  Location: [facility] OR;  Service: General;  Laterality: N/A;   INCISIONAL HERNIA REPAIR  04/13/2014   with mesh    dr Nelda Severe HERNIA REPAIR N/A 04/13/2014   Procedure: OPEN REPAIR INCISIONAL HERNIA ;  Surgeon: Violeta Gelinas, MD;  Location: [facility] OR;  Service: General;  Laterality: N/A;   INSERTION OF MESH N/A 04/13/2014   Procedure: INSERTION OF MESH;  Surgeon: Violeta Gelinas, MD;  Location: [facility] OR;  Service: General;  Laterality: N/A;   KNEE ARTHROSCOPY Right 2010   LAPAROSCOPIC CHOLECYSTECTOMY  10/16/2014   LAPAROTOMY N/A 01/20/2013   Procedure: EXPLORATORY LAPAROTOMY;  Surgeon: Liz Malady, MD;  Location: [facility] OR;  Service: General;  Laterality: N/A;   LAPAROTOMY N/A 03/10/2017   Procedure: EXPLORATORY LAPAROTOMY;  Surgeon: Violeta Gelinas, MD;  Location: [facility] OR;  Service: General;  Laterality: N/A;   Laparotomy with lysis of adhesions   2010   Dr. Janee Morn - [facility]   LYSIS OF ADHESION N/A 01/20/2013   Procedure: LYSIS OF ADHESION;  Surgeon: Liz Malady, MD;  Location: [facility] OR;  Service: General;  Laterality: N/A;   LYSIS OF ADHESION N/A 03/10/2017   Procedure: LYSIS OF ADHESION;  Surgeon: Violeta Gelinas, MD;  Location: [facility] OR;  Service: General;  Laterality: N/A;   microsurgical  03/24/2002   Lateral recess decompression L5   SACRAL NERVE STIMULATOR PLACEMENT     inserted and removed   SHOULDER ARTHROSCOPY Bilateral    VASECTOMY     Prior to Admission medications   Medication Sig Start Date End Date Taking? Authorizing Provider  cholestyramine (QUESTRAN) 4 g packet Take 1 packet by mouth daily. 06/26/23  Yes [provider]  dabigatran (PRADAXA) 150 MG CAPS capsule Take 1 capsule (150 mg total) by mouth 2 (two) times daily. 10/15/22  Yes Nahser, Deloris Ping, MD  loratadine-pseudoephedrine (CLARITIN-D 24 HOUR) 10-240 MG 24 hr tablet Take 1 tablet by mouth as needed for allergies.   Yes [provider]  morphine (MS CONTIN) 30 MG 12 hr tablet Take 30 mg by mouth 2 (two) times daily as needed for pain. 01/20/18  Yes [provider]   Current Facility-Administered Medications  Medication Dose Route Frequency Provider Last Rate Last Admin   0.9 %  sodium chloride infusion   Intravenous Continuous Lewie Chamber, MD 75 mL/hr at 07/24/23 0941 Rate Change at 07/24/23 0941   acetaminophen (TYLENOL) tablet 650 mg  650 mg Oral Q6H PRN Briscoe Burns, MD       Or   acetaminophen (TYLENOL) suppository 650 mg  650 mg Rectal Q6H PRN Briscoe Burns, MD       cholestyramine Lanetta Inch) packet 1 packet  1 packet Oral Daily Briscoe Burns, MD   1 packet at 07/24/23 0941   dabigatran (PRADAXA) capsule 150 mg  150 mg Oral BID Briscoe Burns, MD   150 mg at 07/24/23 0941   hydrALAZINE (APRESOLINE) injection 10 mg  10 mg Intravenous Q6H PRN Janalyn Shy, Subrina, MD       [START ON 07/25/2023] influenza vaccine adjuvanted  (FLUAD) injection 0.5 mL  0.5 mL Intramuscular Tomorrow-1000 Briscoe Burns, MD       morphine (MS CONTIN) 12 hr tablet 30 mg  30 mg Oral Q12H PRN Briscoe Burns, MD   30 mg at 07/23/23 2219   ondansetron (ZOFRAN) injection 4 mg  4 mg Intravenous Q6H PRN Briscoe Burns, MD   4 mg at 07/23/23 1827   Or   ondansetron (ZOFRAN-ODT) disintegrating tablet 4 mg  4 mg Oral Q6H PRN Briscoe Burns, MD       polyethylene glycol (MIRALAX / GLYCOLAX) packet 17 g  17 g Oral Daily PRN Briscoe Burns, MD       prochlorperazine (COMPAZINE) injection 10 mg  10 mg Intravenous Q6H PRN Sundil, Subrina, MD   10 mg at 07/23/23 2136   Allergies as of 07/22/2023 - Review Complete 07/22/2023  Allergen Reaction Noted   Aspirin  08/26/2022   Cephalexin Itching 08/26/2022  Gabapentin Other (See Comments) 08/26/2022   Naproxen Itching 08/26/2022   Pregabalin  08/26/2022   Hydrocodone-acetaminophen Itching and Rash 08/26/2022   Family History  Problem Relation Age of Onset   Heart disease Mother    Brain cancer Father    Social History   Socioeconomic History   Marital status: Married    Spouse name: Not on file   Number of children: Not on file   Years of education: Not on file   Highest education level: Not on file  Occupational History   Not on file  Tobacco Use   Smoking status: Former   Smokeless tobacco: Former    Types: Chew    Quit date: 03/05/1986  Vaping Use   Vaping status: Never Used  Substance and Sexual Activity   Alcohol use: No   Drug use: No   Sexual activity: Not on file  Other Topics Concern   Not on file  Social History Narrative   Married for 45 years, 3 granddaughters   Social Drivers of Corporate investment banker Strain: Not on file  Food Insecurity: No Food Insecurity (07/24/2023)   Hunger Vital Sign    Worried About Running Out of Food in the Last Year: Never true    Ran Out of Food in the Last Year: Never true  Transportation Needs: No Transportation Needs  (07/24/2023)   PRAPARE - Administrator, Civil Service (Medical): No    Lack of Transportation (Non-Medical): No  Physical Activity: Not on file  Stress: Not on file  Social Connections: Moderately Integrated (07/24/2023)   Social Connection and Isolation Panel [NHANES]    Frequency of Communication with Friends and Family: Once a week    Frequency of Social Gatherings with Friends and Family: Once a week    Attends Religious Services: More than 4 times per year    Active Member of [employer] or Organizations: Yes    Attends Banker Meetings: Never    Marital Status: Married  Catering manager Violence: Not At Risk (07/24/2023)   Humiliation, Afraid, Rape, and Kick questionnaire    Fear of Current or Ex-Partner: No    Emotionally Abused: No    Physically Abused: No    Sexually Abused: No   Review of Systems:  Review of Systems  Respiratory:  Negative for shortness of breath.   Cardiovascular:  Negative for chest pain.  Gastrointestinal:  Positive for nausea and vomiting. Negative for abdominal pain, blood in stool, constipation and diarrhea.    OBJECTIVE:   Temp:  [97.4 F (36.3 C)-97.9 F (36.6 C)] 97.4 F (36.3 C) (01/31 0700) Pulse Rate:  [63-80] 79 (01/31 0700) Resp:  [17-20] 18 (01/31 0700) BP: (97-140)/(47-76) 99/50 (01/31 0700) SpO2:  [97 %-100 %] 100 % (01/31 0700) Weight:  [71 kg] 71 kg (01/30 1756) Last BM Date : 07/23/23 Physical Exam Constitutional:      General: He is not in acute distress.    Appearance: He is not ill-appearing, toxic-appearing or diaphoretic.  Cardiovascular:     Rate and Rhythm: Normal rate and regular rhythm.  Pulmonary:     Effort: No respiratory distress.     Breath sounds: Normal breath sounds.  Abdominal:     General: Bowel sounds are normal. There is no distension.     Palpations: Abdomen is soft.     Tenderness: There is no abdominal tenderness. There is no guarding.  Musculoskeletal:     Right lower leg: No  edema.  Left lower leg: No edema.  Skin:    General: Skin is warm and dry.  Neurological:     Mental Status: He is alert.     Labs: Recent Labs    07/22/23 2103 07/22/23 2107 07/23/23 1909 07/24/23 0007  WBC 4.8  --   --  3.9*  HGB 8.0* 9.2* 8.1* 7.5*  HCT 25.1* 27.0* 25.0* 23.1*  PLT 252  --   --  233   BMET Recent Labs    07/24/23 0007 07/24/23 0415 07/24/23 0908  NA 128* 129* 131*  K 4.0 4.0 3.5  CL 98 101 98  CO2 24 24 24   GLUCOSE 142* 145* 158*  BUN 14 16 14   CREATININE 1.26* 1.45* 1.40*  CALCIUM 8.5* 8.5* 8.7*   LFT Recent Labs    07/22/23 2103  PROT 7.8  ALBUMIN 3.6  AST 25  ALT 26  ALKPHOS 105  BILITOT 0.9   PT/INR Recent Labs    07/22/23 2103  LABPROT 17.6*  INR 1.4*    Diagnostic imaging: CT ABDOMEN PELVIS W CONTRAST Result Date: 07/23/2023 CLINICAL DATA:  83 year old male with history of acute onset of nonlocalized abdominal pain. EXAM: CT ABDOMEN AND PELVIS WITH CONTRAST TECHNIQUE: Multidetector CT imaging of the abdomen and pelvis was performed using the standard protocol following bolus administration of intravenous contrast. RADIATION DOSE REDUCTION: This exam was performed according to the departmental dose-optimization program which includes automated exposure control, adjustment of the mA and/or kV according to patient size and/or use of iterative reconstruction technique. CONTRAST:  80mL OMNIPAQUE IOHEXOL 350 MG/ML SOLN COMPARISON:  CT of the abdomen and pelvis 08/19/2022. FINDINGS: Lower chest: Atherosclerotic calcifications are noted in the descending thoracic aorta. Hepatobiliary: No suspicious cystic or solid hepatic lesions. No intra or extrahepatic biliary ductal dilatation. Status post cholecystectomy. Pancreas: No pancreatic mass. No pancreatic ductal dilatation. No pancreatic or peripancreatic fluid collections or inflammatory changes. Spleen: Unremarkable. Adrenals/Urinary Tract: Bilateral kidneys and bilateral adrenal glands are  normal in appearance. No hydroureteronephrosis. Urinary bladder is moderately distended, but otherwise unremarkable in appearance. Stomach/Bowel: The appearance of the stomach is normal. There is no pathologic dilatation of small bowel or colon. Postoperative changes of subtotal colectomy are redemonstrated. Vascular/Lymphatic: Atherosclerotic calcifications are noted in the abdominal aorta and pelvic vasculature. No lymphadenopathy noted in the abdomen or pelvis. Reproductive: Prostate gland and seminal vesicles are unremarkable in appearance. Other: No significant volume of ascites.  No pneumoperitoneum. Musculoskeletal: There are no aggressive appearing lytic or blastic lesions noted in the visualized portions of the skeleton. IMPRESSION: 1. No acute findings are noted in the abdomen or pelvis to account for the patient's symptoms. 2. Aortic atherosclerosis. Aortic Atherosclerosis (ICD10-I70.0). Electronically Signed   By: Trudie Reed M.D.   On: 07/23/2023 05:46   CT HEAD WO CONTRAST Result Date: 07/22/2023 CLINICAL DATA:  Increasing headaches, initial encounter EXAM: CT HEAD WITHOUT CONTRAST TECHNIQUE: Contiguous axial images were obtained from the base of the skull through the vertex without intravenous contrast. RADIATION DOSE REDUCTION: This exam was performed according to the departmental dose-optimization program which includes automated exposure control, adjustment of the mA and/or kV according to patient size and/or use of iterative reconstruction technique. COMPARISON:  10/15/2020 FINDINGS: Brain: No evidence of acute infarction, hemorrhage, hydrocephalus, extra-axial collection or mass lesion/mass effect. Mild atrophic changes are noted. Prior left frontal infarct is noted. Chronic white matter ischemic changes are seen as well Vascular: No hyperdense vessel or unexpected calcification. Skull: Normal. Negative for fracture or focal  lesion. Sinuses/Orbits: No acute finding. Other: None.  IMPRESSION: Chronic atrophic and ischemic changes.  No acute abnormality noted. Electronically Signed   By: Alcide Clever M.D.   On: 07/22/2023 23:54   DG Chest 2 View Result Date: 07/22/2023 CLINICAL DATA:  Chest pain. EXAM: CHEST - 2 VIEW COMPARISON:  07/04/2022, CT 07/05/2022 FINDINGS: Stable heart size and mediastinal contours. Chronic hyperinflation. Chronic lung disease with stable small opacity in the periphery of the right upper lung zone. Subpleural reticulation in a basilar predominant distribution. No acute airspace disease. No pleural effusion or pneumothorax. No acute osseous findings. IMPRESSION: Chronic lung disease.  No acute findings. Electronically Signed   By: Narda Rutherford M.D.   On: 07/22/2023 23:07   IMPRESSION: Hematochezia, resolved with Sitz bath, likely secondary to hemorrhoids/hemorrhoidectomy Anemia, unspecified, likely related to above with resolution of symptoms Paroxysmal atrial fibrillation (on Pradaxa) History subtotal colectomy 2003 with end-to-side ileocolonic anastomosis  -Hemorrhoids and healthy appearing tissue on flexible sigmoidoscopy 09/2022 Diarrhea, secondary to above, on cholestyramine prior to admission Chronic kidney disease Hypertension  PLAN: -No sign of active GI blood loss at this time, no role for endoscopy currently  -Ok for Pradaxa from GI standpoint -Trend H/H, transfuse for Hgb < 7 -Diet as tolerated -Continue cholestyramine -[facility] will sign off and be available should questions arise   LOS: 0 days   Liliane Shi, DO [facility]

## 2023-07-24 NOTE — Assessment & Plan Note (Signed)
-   suspected due to acute blood loss from recent rectal bleeding; hx hemorrhoidectomy Dec 2024 - Hgb noted 12.7 g/dL approx 1 year ago and presented with Hgb 8 g/dL -No bleeding prior to discharge; hemoglobin stable with very mild drop but overall reassuring - Repeat CBC at follow-up -No further workup recommended by GI

## 2023-07-25 DIAGNOSIS — D509 Iron deficiency anemia, unspecified: Secondary | ICD-10-CM | POA: Diagnosis not present

## 2023-07-25 DIAGNOSIS — E871 Hypo-osmolality and hyponatremia: Secondary | ICD-10-CM | POA: Diagnosis not present

## 2023-07-25 LAB — CBC WITH DIFFERENTIAL/PLATELET
Abs Immature Granulocytes: 0.01 10*3/uL (ref 0.00–0.07)
Basophils Absolute: 0.1 10*3/uL (ref 0.0–0.1)
Basophils Relative: 2 %
Eosinophils Absolute: 0.2 10*3/uL (ref 0.0–0.5)
Eosinophils Relative: 4 %
HCT: 23.1 % — ABNORMAL LOW (ref 39.0–52.0)
Hemoglobin: 7.4 g/dL — ABNORMAL LOW (ref 13.0–17.0)
Immature Granulocytes: 0 %
Lymphocytes Relative: 26 %
Lymphs Abs: 1 10*3/uL (ref 0.7–4.0)
MCH: 26 pg (ref 26.0–34.0)
MCHC: 32 g/dL (ref 30.0–36.0)
MCV: 81.1 fL (ref 80.0–100.0)
Monocytes Absolute: 0.6 10*3/uL (ref 0.1–1.0)
Monocytes Relative: 15 %
Neutro Abs: 2.1 10*3/uL (ref 1.7–7.7)
Neutrophils Relative %: 53 %
Platelets: 234 10*3/uL (ref 150–400)
RBC: 2.85 MIL/uL — ABNORMAL LOW (ref 4.22–5.81)
RDW: 14.3 % (ref 11.5–15.5)
WBC: 3.8 10*3/uL — ABNORMAL LOW (ref 4.0–10.5)
nRBC: 0 % (ref 0.0–0.2)

## 2023-07-25 LAB — BASIC METABOLIC PANEL
Anion gap: 8 (ref 5–15)
BUN: 18 mg/dL (ref 8–23)
CO2: 24 mmol/L (ref 22–32)
Calcium: 8.9 mg/dL (ref 8.9–10.3)
Chloride: 105 mmol/L (ref 98–111)
Creatinine, Ser: 1.58 mg/dL — ABNORMAL HIGH (ref 0.61–1.24)
GFR, Estimated: 44 mL/min — ABNORMAL LOW (ref >60)
Glucose, Bld: 95 mg/dL (ref 70–99)
Potassium: 4.5 mmol/L (ref 3.5–5.1)
Sodium: 137 mmol/L (ref 135–145)

## 2023-07-25 LAB — MAGNESIUM: Magnesium: 1.9 mg/dL (ref 1.7–2.4)

## 2023-07-25 NOTE — Discharge Summary (Signed)
Physician Discharge Summary   Kent [name] JXB:[id number] DOB: 1940/11/16 DOA: 07/22/2023  PCP: Noberto Retort, MD  Admit date: 07/22/2023 Discharge date: 07/25/2023   Admitted From: Home Disposition:  Home Discharging physician: Lewie Chamber, MD Barriers to discharge: none  Recommendations at discharge: Repeat CBC   Discharge Condition: stable CODE STATUS: Full Diet recommendation:  Diet Orders (From admission, onward)     Start     Ordered   07/25/23 0000  Diet - low sodium heart healthy        07/25/23 1108   07/23/23 0952  Diet Heart Room service appropriate? Yes; Fluid consistency: Thin  Diet effective now       Question Answer Comment  Room service appropriate? 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   After vomiting episodes he presented for further workup.  Further lab workup revealed Na 123 and he was also started on IVF.   He's also had recent rectal bleeding 2 weeks ago with known history of hemorrhoids and underwent hemorrhoidectomy in Dec 2024.  He remains on pradaxa for PAF.  CT A/P performed on admission was negative for acute findings.  Hgb was 8 g/dL on admission down from prior baseline around 12-13 g/dL.  Case was discussed with GI and he was admitted for further monitoring.  Hemoglobin remained stable and he was continued on Pradaxa.  He had no further episodes of any bleeding and was recommended to not have any further workup.  Assessment and Plan: * Hyponatremia-resolved as of 07/25/2023 - suspect hypovolemic hyponatremia from vomiting/GI losses - serum osmo 271 and urine Na 48, with urine osmo 314 - has responded to IVF so continue for  now -Nausea and vomiting resolved and he was able to tolerate a diet - Sodium also improved with combination of oral intake and fluids - Sodium 137 at discharge  Microcytic anemia - suspected due to acute blood loss from recent rectal bleeding; hx hemorrhoidectomy Dec 2024 - Hgb noted 12.7 g/dL approx 1 year ago and presented with Hgb 8 g/dL -No bleeding prior to discharge; hemoglobin stable with very mild drop but overall reassuring - Repeat CBC at follow-up -No further workup recommended by GI  HTN (hypertension) - No antihypertensives needed at discharge  Atrial fibrillation (HCC) - continue pradaxa for now as per GI - no longer on metoprolol due to SOB which resolved after discontinuation  Chronic back pain Patient on morphine ER 30 mg every 12 hours as needed at home.  PDMP reviewed and appropriate. -Resume home morphine ER 30 mg every 12 hours as needed       The patient's acute and chronic medical conditions were treated accordingly. On day of discharge, patient was felt deemed stable for discharge. Patient/family member advised to call PCP or come back to ER if needed.   Principal Diagnosis: Hyponatremia  Discharge Diagnoses: Active Hospital Problems   Diagnosis Date Noted   Microcytic anemia 07/24/2023    Priority: 1.   HTN (hypertension) 07/24/2023    Priority: 2.   Atrial fibrillation (HCC) 07/05/2022   Chronic back pain 08/18/2013    Resolved Hospital Problems   Diagnosis  Date Noted Date Resolved   Hyponatremia 07/23/2023 07/25/2023    Priority: 1.     Discharge Instructions     Diet - low sodium heart healthy   Complete by: As directed    Increase activity slowly   Complete by: As directed       Allergies as of 07/25/2023       Reactions   Aspirin    dr said not to   Cephalexin Itching   Gabapentin Other (See Comments)   Unknown reaction    Naproxen Itching   Pregabalin    Unknown reaction    Hydrocodone-acetaminophen Itching, Rash         Medication List     TAKE these medications    cholestyramine 4 g packet Commonly known as: QUESTRAN Take 1 packet by mouth daily.   Claritin-D 24 Hour 10-240 MG 24 hr tablet Generic drug: loratadine-pseudoephedrine Take 1 tablet by mouth as needed for allergies.   dabigatran 150 MG Caps capsule Commonly known as: PRADAXA Take 1 capsule (150 mg total) by mouth 2 (two) times daily.   morphine 30 MG 12 hr tablet Commonly known as: MS CONTIN Take 30 mg by mouth 2 (two) times daily as needed for pain.        Allergies  Allergen Reactions   Aspirin     dr said not to   Cephalexin Itching   Gabapentin Other (See Comments)    Unknown reaction    Naproxen Itching   Pregabalin     Unknown reaction    Hydrocodone-Acetaminophen Itching and Rash    Consultations: GI  Procedures:   Discharge Exam: BP (!) 145/69   Pulse 82   Temp 97.7 F (36.5 C) (Oral)   Resp 16   Ht 5\' 10"  (1.778 m)   Wt 71 kg   SpO2 100%   BMI 22.47 kg/m  Physical Exam Constitutional:      General: He is not in acute distress.    Appearance: Normal appearance.  HENT:     Head: Normocephalic and atraumatic.     Mouth/Throat:     Mouth: Mucous membranes are moist.  Eyes:     Extraocular Movements: Extraocular movements intact.  Cardiovascular:     Rate and Rhythm: Normal rate and regular rhythm.  Pulmonary:     Effort: Pulmonary effort is normal. No respiratory distress.     Breath sounds: Normal breath sounds. No wheezing.  Abdominal:     General: Bowel sounds are normal. There is no distension.     Palpations: Abdomen is soft.     Tenderness: There is no abdominal tenderness.  Musculoskeletal:        General: Normal range of motion.     Cervical back: Normal range of motion and neck supple.  Skin:    General: Skin is warm and dry.  Neurological:     General: No focal deficit present.     Mental Status: He is alert.  Psychiatric:        Mood and Affect: Mood normal.         Behavior: Behavior normal.      The results of significant diagnostics from this hospitalization (including imaging, microbiology, ancillary and laboratory) are listed below for reference.   Microbiology: Recent Results (from the past 240 hours)  Resp panel by RT-PCR (RSV, Flu A&B, Covid) Anterior Nasal Swab     Status: None   Collection Time: 07/22/23  9:07 PM   Specimen: Anterior Nasal Swab  Result Value Ref Range Status   SARS Coronavirus 2 by RT PCR NEGATIVE NEGATIVE Final   Influenza A by PCR NEGATIVE NEGATIVE Final   Influenza B by PCR NEGATIVE NEGATIVE Final    Comment: (NOTE) The Xpert Xpress SARS-CoV-2/FLU/RSV plus assay is intended as an aid in the diagnosis of influenza from Nasopharyngeal swab specimens and should not be used as a sole basis for treatment. Nasal washings and aspirates are unacceptable for Xpert Xpress SARS-CoV-2/FLU/RSV testing.  Fact Sheet for Patients: BloggerCourse.com  Fact Sheet for Healthcare Providers: SeriousBroker.it  This test is not yet approved or cleared by the [place] FDA and has been authorized for detection and/or diagnosis of SARS-CoV-2 by FDA under an Emergency Use Authorization (EUA). This EUA will remain in effect (meaning this test can be used) for the duration of the COVID-19 declaration under Section 564(b)(1) of the Act, 21 U.S.C. section 360bbb-3(b)(1), unless the authorization is terminated or revoked.     Resp Syncytial Virus by PCR NEGATIVE NEGATIVE Final    Comment: (NOTE) Fact Sheet for Patients: BloggerCourse.com  Fact Sheet for Healthcare Providers: SeriousBroker.it  This test is not yet approved or cleared by the [place] FDA and has been authorized for detection and/or diagnosis of SARS-CoV-2 by FDA under an Emergency Use Authorization (EUA). This EUA will remain in effect (meaning this test can be  used) for the duration of the COVID-19 declaration under Section 564(b)(1) of the Act, 21 U.S.C. section 360bbb-3(b)(1), unless the authorization is terminated or revoked.  Performed at [facility], [address] [place], [place] [address]      Labs: BNP (last 3 results) No results for input(s): "BNP" in the last 8760 hours. Basic Metabolic Panel: Recent Labs  Lab 07/24/23 0007 07/24/23 0415 07/24/23 0908 07/24/23 1613 07/25/23 0604  NA 128* 129* 131* 131* 137  K 4.0 4.0 3.5 4.2 4.5  CL 98 101 98 100 105  CO2 24 24 24 25 24   GLUCOSE 142* 145* 158* 100* 95  BUN 14 16 14 18 18   CREATININE 1.26* 1.45* 1.40* 1.96* 1.58*  CALCIUM 8.5* 8.5* 8.7* 8.5* 8.9  MG  --   --   --   --  1.9   Liver Function Tests: Recent Labs  Lab 07/22/23 2103  AST 25  ALT 26  ALKPHOS 105  BILITOT 0.9  PROT 7.8  ALBUMIN 3.6   Recent Labs  Lab 07/23/23 0053  LIPASE 39   No results for input(s): "AMMONIA" in the last 168 hours. CBC: Recent Labs  Lab 07/22/23 2103 07/22/23 2107 07/23/23 1909 07/24/23 0007 07/25/23 0604  WBC 4.8  --   --  3.9* 3.8*  NEUTROABS 3.6  --   --   --  2.1  HGB 8.0* 9.2* 8.1* 7.5* 7.4*  HCT 25.1* 27.0* 25.0* 23.1* 23.1*  MCV 81.0  --   --  78.8* 81.1  PLT 252  --   --  233 234   Cardiac Enzymes: No results for input(s): "CKTOTAL", "CKMB", "CKMBINDEX", "TROPONINI" in the last 168 hours. BNP: Invalid input(s): "POCBNP" CBG: No results for input(s): "GLUCAP" in the last 168 hours. D-Dimer No results for input(s): "DDIMER" in the last 72 hours. Hgb A1c No results for input(s): "HGBA1C" in the last 72 hours. Lipid Profile No results for input(s): "CHOL", "HDL", "LDLCALC", "TRIG", "CHOLHDL", "LDLDIRECT" in the last 72 hours. Thyroid function studies No results for input(s): "TSH", "T4TOTAL", "T3FREE", "THYROIDAB" in the last 72 hours.  Invalid input(s): "  FREET3" Anemia work up No results for input(s): "VITAMINB12", "FOLATE", "FERRITIN",  "TIBC", "IRON", "RETICCTPCT" in the last 72 hours. Urinalysis    Component Value Date/Time   COLORURINE STRAW (A) 07/22/2023 2056   APPEARANCEUR CLEAR 07/22/2023 2056   LABSPEC 1.012 07/22/2023 2056   PHURINE 6.0 07/22/2023 2056   GLUCOSEU NEGATIVE 07/22/2023 2056   HGBUR NEGATIVE 07/22/2023 2056   BILIRUBINUR NEGATIVE 07/22/2023 2056   KETONESUR NEGATIVE 07/22/2023 2056   PROTEINUR NEGATIVE 07/22/2023 2056   UROBILINOGEN 0.2 10/04/2014 2223   NITRITE NEGATIVE 07/22/2023 2056   LEUKOCYTESUR NEGATIVE 07/22/2023 2056   Sepsis Labs Recent Labs  Lab 07/22/23 2103 07/24/23 0007 07/25/23 0604  WBC 4.8 3.9* 3.8*   Microbiology Recent Results (from the past 240 hours)  Resp panel by RT-PCR (RSV, Flu A&B, Covid) Anterior Nasal Swab     Status: None   Collection Time: 07/22/23  9:07 PM   Specimen: Anterior Nasal Swab  Result Value Ref Range Status   SARS Coronavirus 2 by RT PCR NEGATIVE NEGATIVE Final   Influenza A by PCR NEGATIVE NEGATIVE Final   Influenza B by PCR NEGATIVE NEGATIVE Final    Comment: (NOTE) The Xpert Xpress SARS-CoV-2/FLU/RSV plus assay is intended as an aid in the diagnosis of influenza from Nasopharyngeal swab specimens and should not be used as a sole basis for treatment. Nasal washings and aspirates are unacceptable for Xpert Xpress SARS-CoV-2/FLU/RSV testing.  Fact Sheet for Patients: BloggerCourse.com  Fact Sheet for Healthcare Providers: SeriousBroker.it  This test is not yet approved or cleared by the [place] FDA and has been authorized for detection and/or diagnosis of SARS-CoV-2 by FDA under an Emergency Use Authorization (EUA). This EUA will remain in effect (meaning this test can be used) for the duration of the COVID-19 declaration under Section 564(b)(1) of the Act, 21 U.S.C. section 360bbb-3(b)(1), unless the authorization is terminated or revoked.     Resp Syncytial Virus by PCR  NEGATIVE NEGATIVE Final    Comment: (NOTE) Fact Sheet for Patients: BloggerCourse.com  Fact Sheet for Healthcare Providers: SeriousBroker.it  This test is not yet approved or cleared by the [place] FDA and has been authorized for detection and/or diagnosis of SARS-CoV-2 by FDA under an Emergency Use Authorization (EUA). This EUA will remain in effect (meaning this test can be used) for the duration of the COVID-19 declaration under Section 564(b)(1) of the Act, 21 U.S.C. section 360bbb-3(b)(1), unless the authorization is terminated or revoked.  Performed at [facility], [address], [place], [place] [address]     Procedures/Studies: CT ABDOMEN PELVIS W CONTRAST Result Date: 07/23/2023 CLINICAL DATA:  83 year old male with history of acute onset of nonlocalized abdominal pain. EXAM: CT ABDOMEN AND PELVIS WITH CONTRAST TECHNIQUE: Multidetector CT imaging of the abdomen and pelvis was performed using the standard protocol following bolus administration of intravenous contrast. RADIATION DOSE REDUCTION: This exam was performed according to the departmental dose-optimization program which includes automated exposure control, adjustment of the mA and/or kV according to patient size and/or use of iterative reconstruction technique. CONTRAST:  80mL OMNIPAQUE IOHEXOL 350 MG/ML SOLN COMPARISON:  CT of the abdomen and pelvis 08/19/2022. FINDINGS: Lower chest: Atherosclerotic calcifications are noted in the descending thoracic aorta. Hepatobiliary: No suspicious cystic or solid hepatic lesions. No intra or extrahepatic biliary ductal dilatation. Status post cholecystectomy. Pancreas: No pancreatic mass. No pancreatic ductal dilatation. No pancreatic or peripancreatic fluid collections or inflammatory changes. Spleen: Unremarkable. Adrenals/Urinary Tract: Bilateral kidneys and bilateral adrenal glands are  normal in appearance. No  hydroureteronephrosis. Urinary bladder is moderately distended, but otherwise unremarkable in appearance. Stomach/Bowel: The appearance of the stomach is normal. There is no pathologic dilatation of small bowel or colon. Postoperative changes of subtotal colectomy are redemonstrated. Vascular/Lymphatic: Atherosclerotic calcifications are noted in the abdominal aorta and pelvic vasculature. No lymphadenopathy noted in the abdomen or pelvis. Reproductive: Prostate gland and seminal vesicles are unremarkable in appearance. Other: No significant volume of ascites.  No pneumoperitoneum. Musculoskeletal: There are no aggressive appearing lytic or blastic lesions noted in the visualized portions of the skeleton. IMPRESSION: 1. No acute findings are noted in the abdomen or pelvis to account for the patient's symptoms. 2. Aortic atherosclerosis. Aortic Atherosclerosis (ICD10-I70.0). Electronically Signed   By: Trudie Reed M.D.   On: 07/23/2023 05:46   CT HEAD WO CONTRAST Result Date: 07/22/2023 CLINICAL DATA:  Increasing headaches, initial encounter EXAM: CT HEAD WITHOUT CONTRAST TECHNIQUE: Contiguous axial images were obtained from the base of the skull through the vertex without intravenous contrast. RADIATION DOSE REDUCTION: This exam was performed according to the departmental dose-optimization program which includes automated exposure control, adjustment of the mA and/or kV according to patient size and/or use of iterative reconstruction technique. COMPARISON:  10/15/2020 FINDINGS: Brain: No evidence of acute infarction, hemorrhage, hydrocephalus, extra-axial collection or mass lesion/mass effect. Mild atrophic changes are noted. Prior left frontal infarct is noted. Chronic white matter ischemic changes are seen as well Vascular: No hyperdense vessel or unexpected calcification. Skull: Normal. Negative for fracture or focal lesion. Sinuses/Orbits: No acute finding. Other: None. IMPRESSION: Chronic atrophic and  ischemic changes.  No acute abnormality noted. Electronically Signed   By: Alcide Clever M.D.   On: 07/22/2023 23:54   DG Chest 2 View Result Date: 07/22/2023 CLINICAL DATA:  Chest pain. EXAM: CHEST - 2 VIEW COMPARISON:  07/04/2022, CT 07/05/2022 FINDINGS: Stable heart size and mediastinal contours. Chronic hyperinflation. Chronic lung disease with stable small opacity in the periphery of the right upper lung zone. Subpleural reticulation in a basilar predominant distribution. No acute airspace disease. No pleural effusion or pneumothorax. No acute osseous findings. IMPRESSION: Chronic lung disease.  No acute findings. Electronically Signed   By: Narda Rutherford M.D.   On: 07/22/2023 23:07     Time coordinating discharge: Over 30 minutes    Lewie Chamber, MD  [facility] 07/25/2023, 3:53 PM

## 2023-07-31 IMAGING — MR MR LUMBAR SPINE WO/W CM
4 of 7 series · 19 of 48 positions shown · IV contrast (multihance)
Comparison: December 27, 2019.

CLINICAL DATA: Radiculopathy, lumbar region 2GV.AC (LU1-OR-CM)

EXAM:
MRI LUMBAR SPINE WITHOUT AND WITH CONTRAST
TECHNIQUE: Multiplanar and multiecho pulse sequences of the lumbar spine were
obtained without and with intravenous contrast.
CONTRAST:  15mL MULTIHANCE GADOBENATE DIMEGLUMINE 529 MG/ML IV SOLN

[Series 5: T1 · sagittal · 4.0mm · 0.73mm/px · 3 of 15 slices shown (1 of 2)]
[im 1/15]
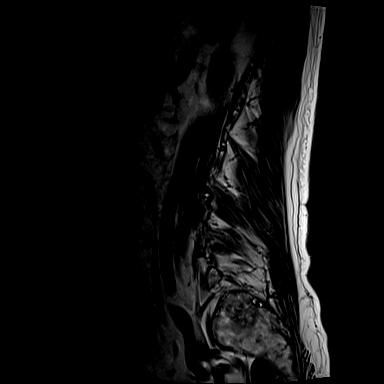
[im 8/15]
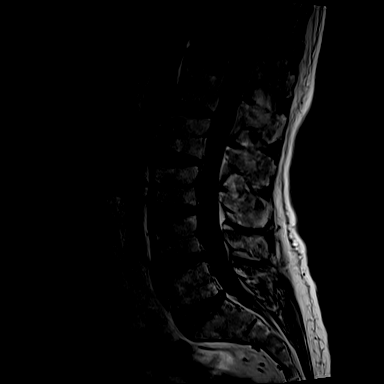
[im 15/15]
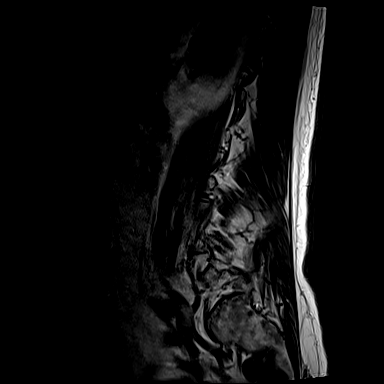

[Series 9: T1 · axial · 4.0mm · 0.28mm/px · z∈[-109,+100]mm · 3 of 46 slices shown (2 of 2)]
[im 5/46]
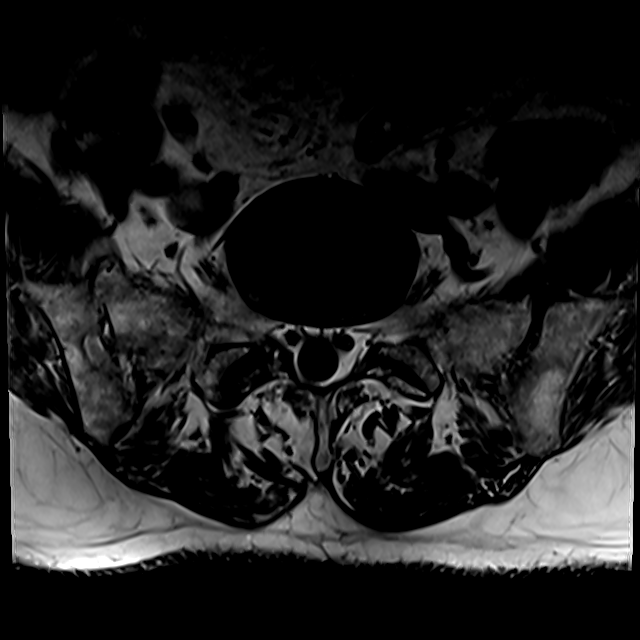
[im 23/46]
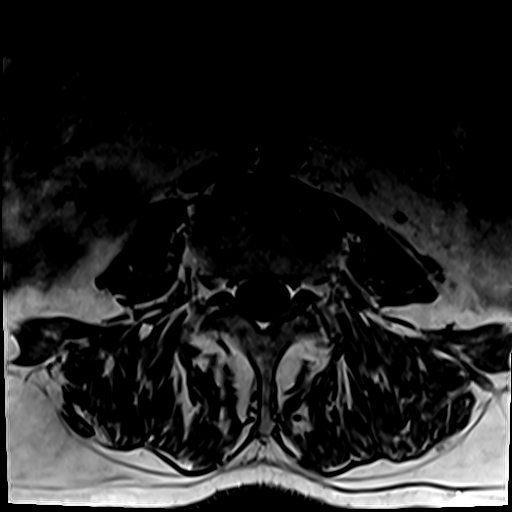
[im 41/46]
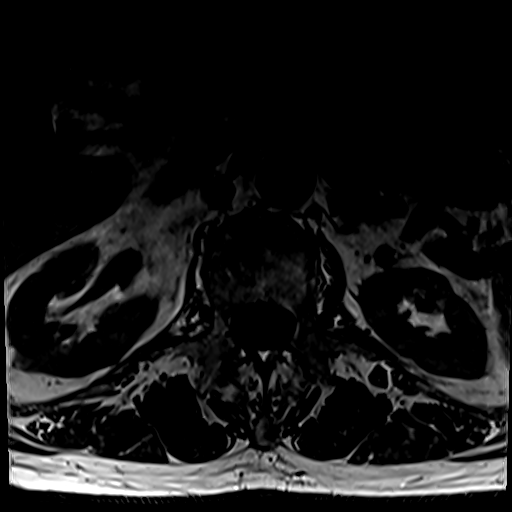

[Series 12: T2 · axial · 4.0mm · 0.28mm/px · z∈[-127,+100]mm · 10 of 46 slices shown (1 of 2)]
[im 1/46]
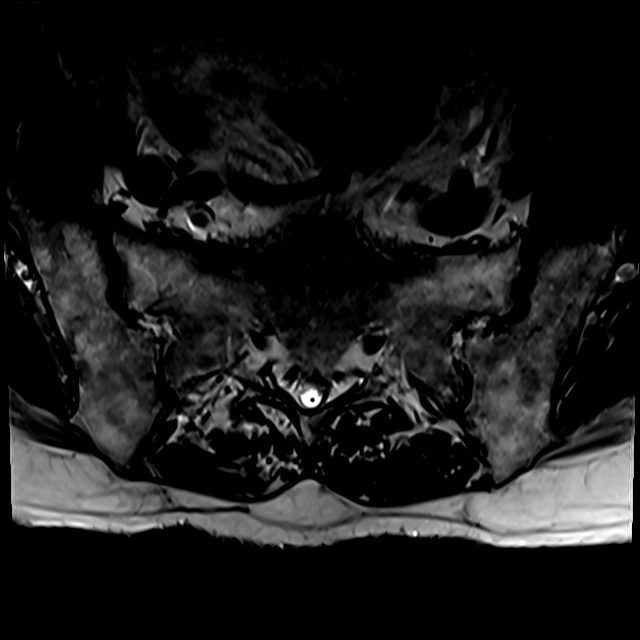
[im 5/46]
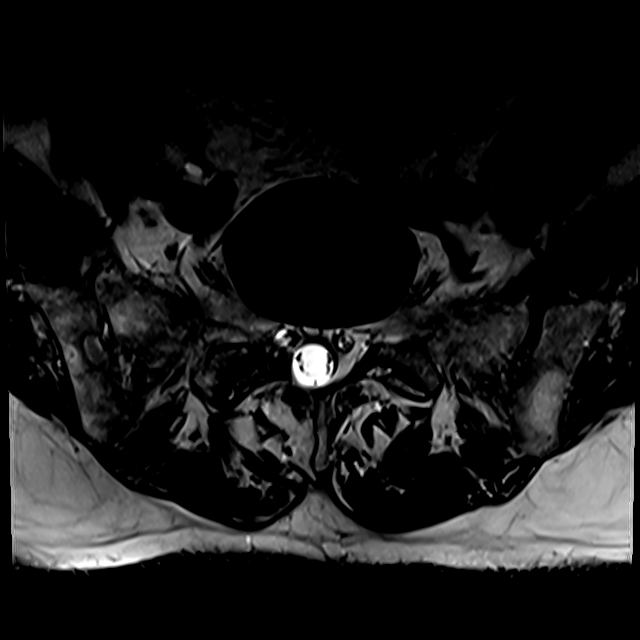
[im 10/46]
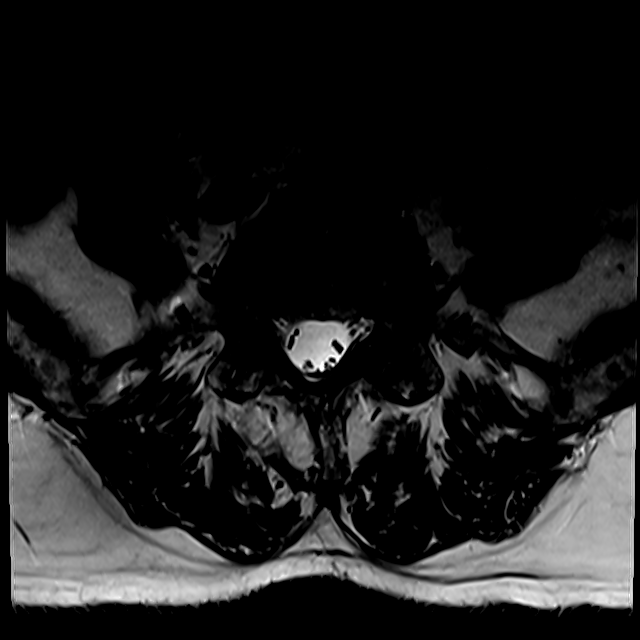
[im 14/46]
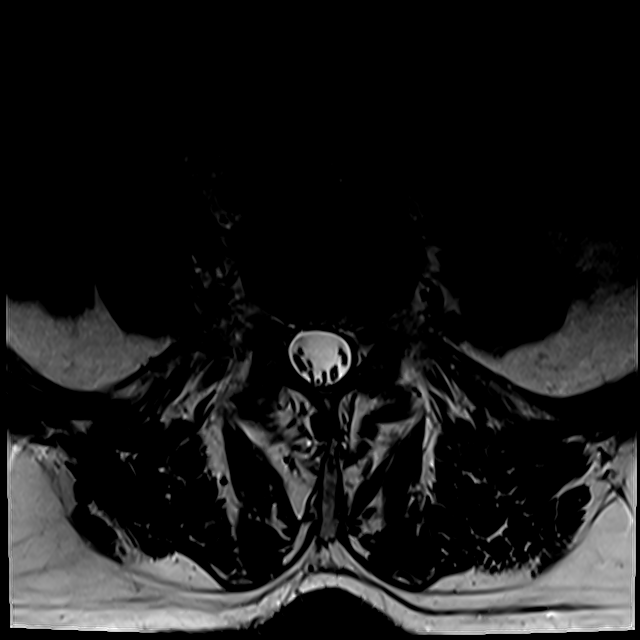
[im 19/46]
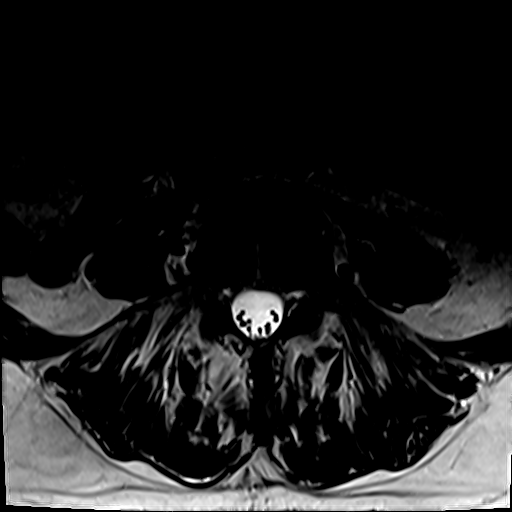
[im 23/46]
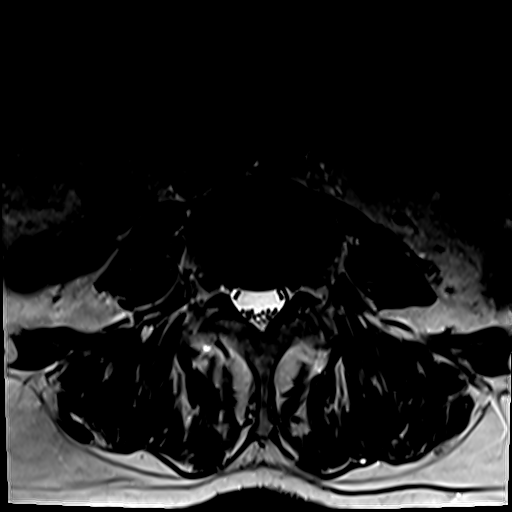
[im 28/46]
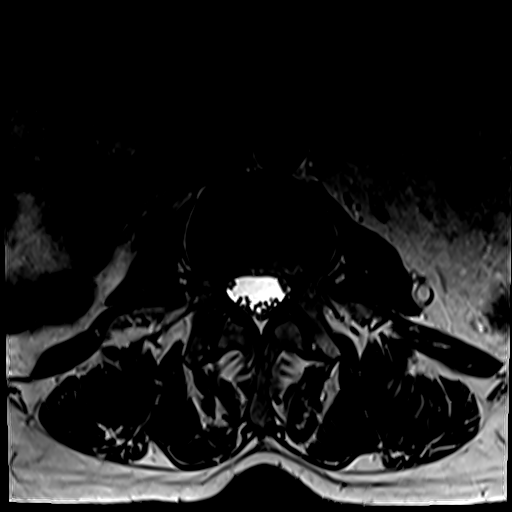
[im 32/46]
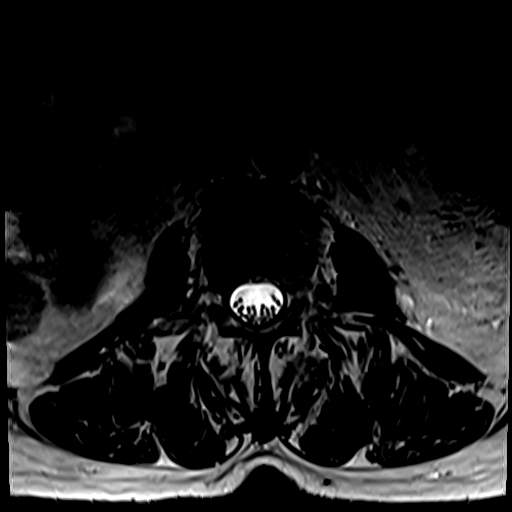
[im 37/46]
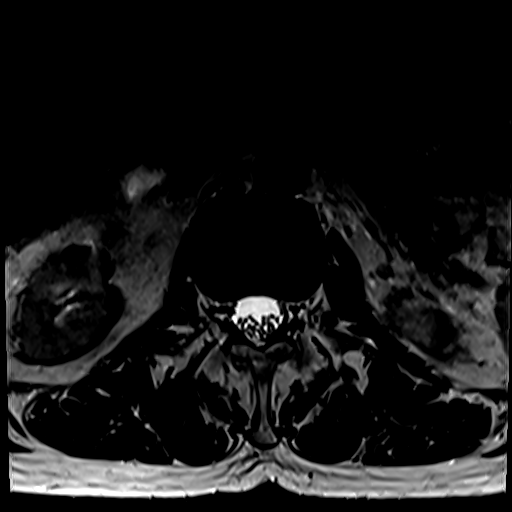
[im 41/46]
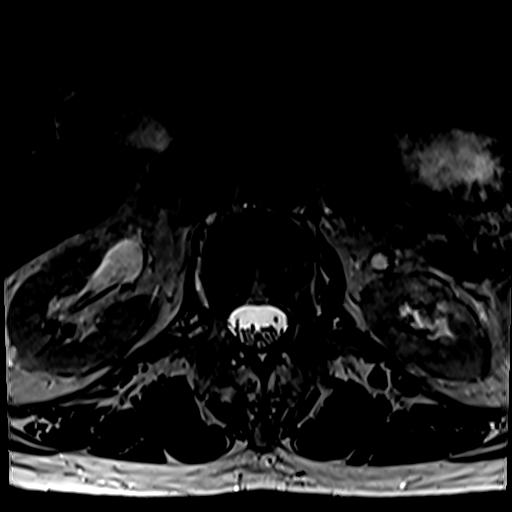

[Series 13: T2 · sagittal · 4.0mm · 0.73mm/px · 3 of 15 slices shown (2 of 2)]
[im 1/15]
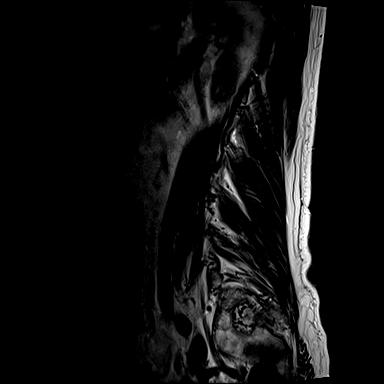
[im 10/15]
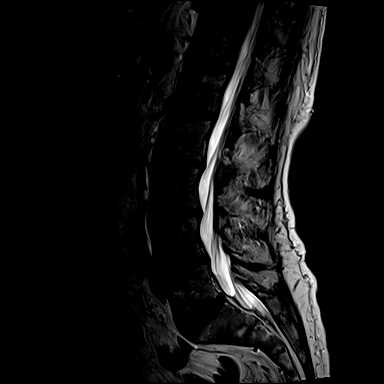
[im 15/15]
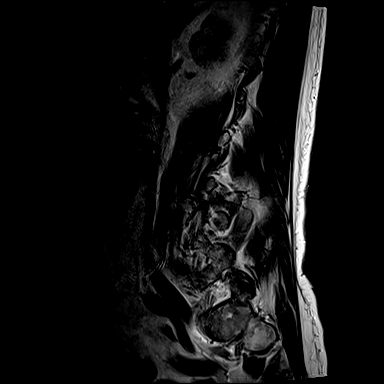

[19 of 48 positions shown; findings below may reference images not displayed]

FINDINGS: Segmentation:  Standard.

Alignment:  No substantial sagittal subluxation.

Vertebrae: Vertebral body heights are maintained. No focal marrow
edema to suggest acute fracture or discitis/osteomyelitis. The

Conus medullaris and cauda equina: Conus extends to the L1 level.
Conus appears normal. No abnormal enhancement the conus or cauda
equina nerve roots.

Paraspinal and other soft tissues: Unremarkable.

Disc levels:

T12-L1: No significant disc protrusion, foraminal stenosis, or canal
stenosis.

L1-L2: No significant disc protrusion, foraminal stenosis, or canal
stenosis.

L2-L3: Disc desiccation and height loss. Mild disc bulging without
significant canal or foraminal stenosis. No substantial change.

L3-L4: Similar small central disc protrusion. No significant canal
or foraminal stenosis. No substantial change.

L4-L5: Similar broad disc bulge with annular fissures. Postsurgical
change. Bilateral facet arthropathy. Resulting mild bilateral
foraminal stenosis, similar. No significant canal stenosis.

L5-S1: Mild disc bulging with central annular fissure, similar.
Bilateral facet arthropathy. No significant canal or foraminal
stenosis
IMPRESSION: Similar mild bilateral foraminal stenosis at L4-L5. Otherwise, no
significant canal or foraminal stenosis.

## 2023-08-14 ENCOUNTER — Other Ambulatory Visit: Payer: Self-pay | Admitting: Cardiovascular Disease

## 2023-08-14 ENCOUNTER — Telehealth (HOSPITAL_COMMUNITY): Payer: Self-pay

## 2023-08-14 NOTE — Telephone Encounter (Signed)
 Patient given samples of Eliquis 5 mg tablet on 10/31/2022. Clint Fenton-PA Exp: 03/22/24 Lot # [id number] Manufacturer: [employer] Quantity: 42 tablets

## 2023-11-04 ENCOUNTER — Other Ambulatory Visit: Payer: Self-pay

## 2023-11-04 DIAGNOSIS — I480 Paroxysmal atrial fibrillation: Secondary | ICD-10-CM

## 2023-11-04 DIAGNOSIS — Z79899 Other long term (current) drug therapy: Secondary | ICD-10-CM

## 2023-11-04 MED ORDER — DABIGATRAN ETEXILATE MESYLATE 150 MG PO CAPS: 150.0000 mg | ORAL_CAPSULE | Freq: Two times a day (BID) | ORAL | 1 refills | Status: DC

## 2024-06-06 ENCOUNTER — Other Ambulatory Visit: Payer: Self-pay

## 2024-06-06 DIAGNOSIS — Z79899 Other long term (current) drug therapy: Secondary | ICD-10-CM

## 2024-06-06 DIAGNOSIS — I480 Paroxysmal atrial fibrillation: Secondary | ICD-10-CM

## 2024-06-06 DIAGNOSIS — D6869 Other thrombophilia: Secondary | ICD-10-CM

## 2024-06-07 ENCOUNTER — Other Ambulatory Visit: Payer: Self-pay | Admitting: Physician Assistant

## 2024-06-07 DIAGNOSIS — D6869 Other thrombophilia: Secondary | ICD-10-CM

## 2024-06-07 DIAGNOSIS — Z79899 Other long term (current) drug therapy: Secondary | ICD-10-CM

## 2024-06-07 DIAGNOSIS — I480 Paroxysmal atrial fibrillation: Secondary | ICD-10-CM

## 2024-06-07 MED ORDER — DABIGATRAN ETEXILATE MESYLATE 150 MG PO CAPS: 150.0000 mg | ORAL_CAPSULE | Freq: Two times a day (BID) | ORAL | 0 refills | Status: AC

## 2024-06-07 NOTE — Telephone Encounter (Signed)
 Prescription refill request for pradaxa  received.  Indication:afib Last office visit:needs appt Weight: Age: Scr: CrCl:  Prescription refilled

## 2024-06-28 ENCOUNTER — Inpatient Hospital Stay (HOSPITAL_COMMUNITY)

## 2024-06-28 ENCOUNTER — Inpatient Hospital Stay (HOSPITAL_COMMUNITY)
Admission: EM | Admit: 2024-06-28 | Discharge: 2024-07-01 | DRG: 377 | Disposition: A | Discharge status: Home / Self Care | Attending: Internal Medicine | Admitting: Internal Medicine

## 2024-06-28 ENCOUNTER — Other Ambulatory Visit: Payer: Self-pay

## 2024-06-28 ENCOUNTER — Encounter (HOSPITAL_COMMUNITY): Payer: Self-pay | Admitting: Emergency Medicine

## 2024-06-28 DIAGNOSIS — T45515A Adverse effect of anticoagulants, initial encounter: Secondary | ICD-10-CM | POA: Diagnosis present

## 2024-06-28 DIAGNOSIS — G894 Chronic pain syndrome: Secondary | ICD-10-CM | POA: Diagnosis present

## 2024-06-28 DIAGNOSIS — K529 Noninfective gastroenteritis and colitis, unspecified: Secondary | ICD-10-CM | POA: Diagnosis present

## 2024-06-28 DIAGNOSIS — D6832 Hemorrhagic disorder due to extrinsic circulating anticoagulants: Secondary | ICD-10-CM | POA: Diagnosis present

## 2024-06-28 DIAGNOSIS — I5033 Acute on chronic diastolic (congestive) heart failure: Secondary | ICD-10-CM | POA: Diagnosis present

## 2024-06-28 DIAGNOSIS — K2970 Gastritis, unspecified, without bleeding: Secondary | ICD-10-CM | POA: Diagnosis present

## 2024-06-28 DIAGNOSIS — Z79891 Long term (current) use of opiate analgesic: Secondary | ICD-10-CM | POA: Diagnosis not present

## 2024-06-28 DIAGNOSIS — R0602 Shortness of breath: Secondary | ICD-10-CM | POA: Diagnosis not present

## 2024-06-28 DIAGNOSIS — Z8249 Family history of ischemic heart disease and other diseases of the circulatory system: Secondary | ICD-10-CM | POA: Diagnosis not present

## 2024-06-28 DIAGNOSIS — K219 Gastro-esophageal reflux disease without esophagitis: Secondary | ICD-10-CM | POA: Diagnosis present

## 2024-06-28 DIAGNOSIS — Z980 Intestinal bypass and anastomosis status: Secondary | ICD-10-CM

## 2024-06-28 DIAGNOSIS — R600 Localized edema: Secondary | ICD-10-CM

## 2024-06-28 DIAGNOSIS — K921 Melena: Secondary | ICD-10-CM | POA: Diagnosis present

## 2024-06-28 DIAGNOSIS — Z87891 Personal history of nicotine dependence: Secondary | ICD-10-CM

## 2024-06-28 DIAGNOSIS — I482 Chronic atrial fibrillation, unspecified: Secondary | ICD-10-CM | POA: Diagnosis present

## 2024-06-28 DIAGNOSIS — D649 Anemia, unspecified: Secondary | ICD-10-CM | POA: Diagnosis not present

## 2024-06-28 DIAGNOSIS — Z79899 Other long term (current) drug therapy: Secondary | ICD-10-CM | POA: Diagnosis not present

## 2024-06-28 DIAGNOSIS — Z7901 Long term (current) use of anticoagulants: Secondary | ICD-10-CM

## 2024-06-28 DIAGNOSIS — M1990 Unspecified osteoarthritis, unspecified site: Secondary | ICD-10-CM | POA: Diagnosis present

## 2024-06-28 DIAGNOSIS — K922 Gastrointestinal hemorrhage, unspecified: Secondary | ICD-10-CM | POA: Diagnosis not present

## 2024-06-28 DIAGNOSIS — Z886 Allergy status to analgesic agent status: Secondary | ICD-10-CM | POA: Diagnosis not present

## 2024-06-28 DIAGNOSIS — I110 Hypertensive heart disease with heart failure: Secondary | ICD-10-CM | POA: Diagnosis present

## 2024-06-28 DIAGNOSIS — K640 First degree hemorrhoids: Secondary | ICD-10-CM | POA: Diagnosis present

## 2024-06-28 DIAGNOSIS — D62 Acute posthemorrhagic anemia: Secondary | ICD-10-CM | POA: Diagnosis present

## 2024-06-28 DIAGNOSIS — M549 Dorsalgia, unspecified: Secondary | ICD-10-CM | POA: Diagnosis present

## 2024-06-28 DIAGNOSIS — Z85828 Personal history of other malignant neoplasm of skin: Secondary | ICD-10-CM

## 2024-06-28 DIAGNOSIS — K649 Unspecified hemorrhoids: Secondary | ICD-10-CM | POA: Diagnosis not present

## 2024-06-28 DIAGNOSIS — E871 Hypo-osmolality and hyponatremia: Secondary | ICD-10-CM | POA: Diagnosis present

## 2024-06-28 DIAGNOSIS — Z9049 Acquired absence of other specified parts of digestive tract: Secondary | ICD-10-CM

## 2024-06-28 DIAGNOSIS — M7989 Other specified soft tissue disorders: Secondary | ICD-10-CM | POA: Diagnosis not present

## 2024-06-28 LAB — COMPREHENSIVE METABOLIC PANEL WITH GFR
ALT: 12 U/L (ref 0–44)
AST: 22 U/L (ref 15–41)
Albumin: 3.4 g/dL — ABNORMAL LOW (ref 3.5–5.0)
Alkaline Phosphatase: 64 U/L (ref 38–126)
Anion gap: 7 (ref 5–15)
BUN: 17 mg/dL (ref 8–23)
CO2: 24 mmol/L (ref 22–32)
Calcium: 8.7 mg/dL — ABNORMAL LOW (ref 8.9–10.3)
Chloride: 98 mmol/L (ref 98–111)
Creatinine, Ser: 1.26 mg/dL — ABNORMAL HIGH (ref 0.61–1.24)
GFR, Estimated: 57 mL/min — ABNORMAL LOW
Glucose, Bld: 109 mg/dL — ABNORMAL HIGH (ref 70–99)
Potassium: 4.1 mmol/L (ref 3.5–5.1)
Sodium: 129 mmol/L — ABNORMAL LOW (ref 135–145)
Total Bilirubin: 0.5 mg/dL (ref 0.0–1.2)
Total Protein: 6.4 g/dL — ABNORMAL LOW (ref 6.5–8.1)

## 2024-06-28 LAB — CBC
HCT: 19.6 % — ABNORMAL LOW (ref 39.0–52.0)
Hemoglobin: 6.1 g/dL — CL (ref 13.0–17.0)
MCH: 25.7 pg — ABNORMAL LOW (ref 26.0–34.0)
MCHC: 31.1 g/dL (ref 30.0–36.0)
MCV: 82.7 fL (ref 80.0–100.0)
Platelets: 228 K/uL (ref 150–400)
RBC: 2.37 MIL/uL — ABNORMAL LOW (ref 4.22–5.81)
RDW: 14.9 % (ref 11.5–15.5)
WBC: 3.8 K/uL — ABNORMAL LOW (ref 4.0–10.5)
nRBC: 0 % (ref 0.0–0.2)

## 2024-06-28 LAB — OCCULT BLOOD, POC DEVICE: Fecal Occult Bld: POSITIVE — AB

## 2024-06-28 LAB — PRO BRAIN NATRIURETIC PEPTIDE: Pro Brain Natriuretic Peptide: 3791 pg/mL — ABNORMAL HIGH

## 2024-06-28 LAB — PROTIME-INR
INR: 1.9 — ABNORMAL HIGH (ref 0.8–1.2)
Prothrombin Time: 23.1 s — ABNORMAL HIGH (ref 11.4–15.2)

## 2024-06-28 LAB — PREPARE RBC (CROSSMATCH)

## 2024-06-28 MED ORDER — CHOLESTYRAMINE LIGHT 4 G PO PACK
4.0000 g | PACK | Freq: Two times a day (BID) | ORAL | Status: DC
  Administered 2024-06-29 (×2): 4 g via ORAL
  Filled 2024-06-28 (×7): qty 1

## 2024-06-28 MED ORDER — PANTOPRAZOLE SODIUM 40 MG IV SOLR
40.0000 mg | Freq: Two times a day (BID) | INTRAVENOUS | Status: DC
  Administered 2024-06-28 – 2024-06-30 (×5): 40 mg via INTRAVENOUS
  Filled 2024-06-28 (×5): qty 10

## 2024-06-28 MED ORDER — ACETAMINOPHEN 650 MG RE SUPP: 650.0000 mg | Freq: Four times a day (QID) | RECTAL | Status: DC | PRN

## 2024-06-28 MED ORDER — SENNOSIDES-DOCUSATE SODIUM 8.6-50 MG PO TABS: 1.0000 | ORAL_TABLET | Freq: Every evening | ORAL | Status: DC | PRN

## 2024-06-28 MED ORDER — FUROSEMIDE 10 MG/ML IJ SOLN
40.0000 mg | Freq: Every day | INTRAMUSCULAR | Status: DC
  Administered 2024-06-28: 40 mg via INTRAVENOUS
  Filled 2024-06-28: qty 4

## 2024-06-28 MED ORDER — MORPHINE SULFATE ER 30 MG PO TBCR
30.0000 mg | EXTENDED_RELEASE_TABLET | Freq: Two times a day (BID) | ORAL | Status: DC
  Administered 2024-06-28 – 2024-07-01 (×6): 30 mg via ORAL
  Filled 2024-06-28 (×6): qty 1

## 2024-06-28 MED ORDER — BISACODYL 5 MG PO TBEC: 5.0000 mg | DELAYED_RELEASE_TABLET | Freq: Every day | ORAL | Status: DC | PRN

## 2024-06-28 MED ORDER — ONDANSETRON HCL 4 MG PO TABS: 4.0000 mg | ORAL_TABLET | Freq: Four times a day (QID) | ORAL | Status: DC | PRN

## 2024-06-28 MED ORDER — SODIUM CHLORIDE 0.9% IV SOLUTION: Freq: Once | INTRAVENOUS | Status: AC

## 2024-06-28 MED ORDER — ONDANSETRON HCL 4 MG/2ML IJ SOLN: 4.0000 mg | Freq: Four times a day (QID) | INTRAMUSCULAR | Status: DC | PRN

## 2024-06-28 MED ORDER — ACETAMINOPHEN 325 MG PO TABS: 650.0000 mg | ORAL_TABLET | Freq: Four times a day (QID) | ORAL | Status: DC | PRN

## 2024-06-28 NOTE — H&P (Signed)
 " History and Physical  Kent [name] FMW:[id number] DOB: [birth year] DOA: 06/28/2024  PCP: Arloa Elsie SAUNDERS, MD   Chief Complaint: Anemia, dark stools  HPI: Kent [name] is a 84 y.o. male with medical history significant for A-fib on Pradaxa , SBO, GERD, chronic diarrhea, chronic pain syndrome, chronic back pain, osteoarthritis and HTN who was advised to present to the ED due to low hemoglobin.  Patient reports that since Christmas, he has had dark red stools with associated fatigue.  He started having some burning sensation in his chest so he followed up with his PCP yesterday and was told to take Nexium which improved his chest pain. Blood work was obtained and he received a call to present today ED for blood transfusion due to hemoglobin of 6.7.  He also reports recent lower extremity swelling and dyspnea on exertion since Christmas. This morning, he had worsening fatigue and mild dizziness but denies any chest pain, nausea, vomiting, abdominal pain, hematochezia, hematuria, fever or chills.  ED Course: Initial vitals show patient afebrile and normotensive, SpO2 97% on room air. Initial labs significant for sodium 129, creatinine 1.26, WBC 3.8, Hgb 6.1, INR 1.9 and positive FOBT. Patient was started on 1 unit PRBC.  GI (Dr. Dianna ) was consulted for evaluation. [facility] was consulted for admission.   Review of Systems: Please see HPI for pertinent positives and negatives. A complete 10 system review of systems are otherwise negative.  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       Cancer (HCC)    basal cell cancer - skin   Chronic back pain    s/p back surgery   Dysrhythmia    GERD (gastroesophageal reflux disease) 08/18/2013   Incisional hernia, without obstruction or gangrene 02/02/2013   Postural dizziness with presyncope 09/01/2016   Sinus infection    currently on antibiotics   Small bowel obstruction due to adhesions [facility])    Past Surgical History:   Procedure Laterality Date   BACK SURGERY  08/25/2001   Neck 3 disk replacements   BACK SURGERY     Placement/removal of nerve stimulator   CHOLECYSTECTOMY N/A 10/16/2014   Procedure: LAPAROSCOPIC CHOLECYSTECTOMY WITH ATTEMPTED CHOLANGIOGRAM;  Surgeon: Dann Hummer, MD;  Location: [facility] OR;  Service: General;  Laterality: N/A;   COLON SURGERY  04/2002   Dr. Effie - Subtotal colectomy, ileostomy   ELBOW SURGERY Right    for blood vessel &  hung nerve   ESOPHAGEAL DILATION     EXPLORATORY LAPAROTOMY  03/10/2017   HEMORRHOID SURGERY N/A 05/25/2023   Procedure: HEMORRHOIDECTOMY INTERNAL;  Surgeon: Teresa Lonni HERO, MD;  Location: [facility] ORS;  Service: General;  Laterality: N/A;   HERNIA REPAIR     Ileostomy Takedown  08/2002   Dr. Effie   INCISION AND DRAINAGE FOOT     INCISIONAL HERNIA REPAIR N/A 01/20/2013   Procedure: HERNIA REPAIR INCISIONAL;  Surgeon: Dann FORBES Hummer, MD;  Location: [facility] OR;  Service: General;  Laterality: N/A;   INCISIONAL HERNIA REPAIR  04/13/2014   with mesh    dr hummer COAST HERNIA REPAIR N/A 04/13/2014   Procedure: OPEN REPAIR INCISIONAL HERNIA ;  Surgeon: Dann Hummer, MD;  Location: [facility] OR;  Service: General;  Laterality: N/A;   INSERTION OF MESH N/A 04/13/2014   Procedure: INSERTION OF MESH;  Surgeon: Dann Hummer, MD;  Location: [facility] OR;  Service: General;  Laterality: N/A;   KNEE ARTHROSCOPY Right 2010  LAPAROSCOPIC CHOLECYSTECTOMY  10/16/2014   LAPAROTOMY N/A 01/20/2013   Procedure: EXPLORATORY LAPAROTOMY;  Surgeon: Dann FORBES Hummer, MD;  Location: [facility] OR;  Service: General;  Laterality: N/A;   LAPAROTOMY N/A 03/10/2017   Procedure: EXPLORATORY LAPAROTOMY;  Surgeon: Hummer Dann, MD;  Location: [facility] OR;  Service: General;  Laterality: N/A;   Laparotomy with lysis of adhesions  2010   Dr. Hummer - SBO   LYSIS OF ADHESION N/A 01/20/2013   Procedure: LYSIS OF ADHESION;  Surgeon: Dann FORBES Hummer, MD;  Location: [facility] OR;  Service: General;   Laterality: N/A;   LYSIS OF ADHESION N/A 03/10/2017   Procedure: LYSIS OF ADHESION;  Surgeon: Hummer Dann, MD;  Location: [facility] OR;  Service: General;  Laterality: N/A;   microsurgical  03/24/2002   Lateral recess decompression L5   SACRAL NERVE STIMULATOR PLACEMENT     inserted and removed   SHOULDER ARTHROSCOPY Bilateral    VASECTOMY     Social History:  reports that he has quit smoking. He quit smokeless tobacco use about 38 years ago.  His smokeless tobacco use included chew. He reports that he does not drink alcohol  and does not use drugs.  Allergies[1]  Family History  Problem Relation Age of Onset   Heart disease Mother    Brain cancer Father      Prior to Admission medications  Medication Sig Start Date End Date Taking? Authorizing Provider  cholestyramine  (QUESTRAN ) 4 g packet Take 4 g by mouth in the morning and at bedtime. 06/26/23  Yes [provider]  dabigatran  (PRADAXA ) 150 MG CAPS capsule Take 1 capsule (150 mg total) by mouth 2 (two) times daily. NEEDS CARDIOLOGY APPT FOR REFILLS, CALL OFFICE [phone]. THANK YOU. Patient taking differently: Take 150 mg by mouth in the morning and at bedtime. 06/07/24  Yes Fenton, Clint R, PA  morphine  (MS CONTIN ) 30 MG 12 hr tablet Take 30 mg by mouth every 12 (twelve) hours. 01/20/18  Yes [provider]    Physical Exam: BP 123/60   Pulse 69   Temp 97.7 F (36.5 C) (Oral)   Resp 16   Ht 5' 10 (1.778 m)   Wt 71.2 kg   SpO2 96%   BMI 22.53 kg/m  General: Pleasant, well-appearing elderly man laying in bed. No acute distress. HEENT: [place]/AT. Anicteric sclera Neck: Supple. JVD to the mid neck CV: Regular rate. Irregularly irregular rhythm. No murmurs, rubs, or gallops.  Pulmonary: Lungs CTAB. Normal effort. No rales or wheezing. Abdominal: Soft, nontender, nondistended. Normal bowel sounds. Extremities: 1+ BLE edema up to knee.  Mild ttp of BLE. Normal ROM. Skin: Warm and dry. No obvious rash or  lesions. Neuro: A&Ox3. Moves all extremities. Normal sensation to light touch. No focal deficit. Psych: Normal mood and affect          Labs on Admission:  Basic Metabolic Panel: Recent Labs  Lab 06/28/24 1314  NA 129*  K 4.1  CL 98  CO2 24  GLUCOSE 109*  BUN 17  CREATININE 1.26*  CALCIUM 8.7*   Liver Function Tests: Recent Labs  Lab 06/28/24 1314  AST 22  ALT 12  ALKPHOS 64  BILITOT 0.5  PROT 6.4*  ALBUMIN  3.4*   No results for input(s): LIPASE, AMYLASE in the last 168 hours. No results for input(s): AMMONIA in the last 168 hours. CBC: Recent Labs  Lab 06/28/24 1314  WBC 3.8*  HGB 6.1*  HCT 19.6*  MCV 82.7  PLT 228  Cardiac Enzymes: No results for input(s): CKTOTAL, CKMB, CKMBINDEX, TROPONINI in the last 168 hours. BNP (last 3 results) No results for input(s): BNP in the last 8760 hours.  ProBNP (last 3 results) Recent Labs    06/28/24 1314  PROBNP 3,791.0*    CBG: No results for input(s): GLUCAP in the last 168 hours.  Radiological Exams on Admission: DG CHEST PORT 1 VIEW Result Date: 06/28/2024 CLINICAL DATA:  Short of breath EXAM: PORTABLE CHEST 1 VIEW COMPARISON:  07/22/2023, chest CT 07/05/2022 FINDINGS: Mild cardiomegaly with small pleural effusions. Central vascular congestion. Mild peripheral reticular opacities, consistent with chronic lung disease. Aortic atherosclerosis. No pneumothorax IMPRESSION: Cardiomegaly with small pleural effusions and central vascular congestion. Chronic lung disease. Electronically Signed   By: Luke Bun M.D.   On: 06/28/2024 19:01   My independent interpretation of EKG: A-fib with PVCs and nonspecific ST and T wave abnormalities  Assessment/Plan Kent [name] is a 84 y.o. male with medical history significant for for A-fib on Pradaxa , SBO, chronic diarrhea, GERD, chronic pain syndrome, chronic back pain, osteoarthritis and HTN who was advised to present to the ED due to low hemoglobin and  admitted for GI bleed and acute on chronic diastolic heart failure.  # GI bleed # Symptomatic anemia - Hgb of 6.1 on admission from baseline of 7-9 - Pt presented with melenic stools, fatigue and mild dizziness - FOBT positive, Pt hemodynamically stable - [facility] consulted, will see in a.m. - Continue 1 unit PRBC transfusion and start IV Protonix  40 mg every 12 hours - Follow up iron  studies, ferritin and vitamin B12 - Trend CBC and Transfuse for Hgb goal > 7 - Keep n.p.o. at midnight for possible EGD by GI  # Acute on chronic diastolic HF - Last TTE on 06/2022 shows EF 55 to 60% and mild to moderate LAE dilated LA - Pt presented with progressive fatigue, dyspnea on exertion and lower extremity edema - Pt with clinical, radiological and laboratory signs of CHF exacerbation - Acute CHF likely 2/2 acute blood loss anemia - Start IV lasix  40 mg daily - Follow up repeat echocardiogram - Strict I&O, daily weights - Maintain K+ > 4.0, Mag > 2.0 - Telemetry  # Hyponatremia - Sodium of 129 on admission from 137 likely months ago although patient does have a history of hyponatremia with sodium to 128-131 range - Pt slightly overloaded on exam, concern for possible hypervolemic hyponatremia in the setting of CHF exacerbation. Patient also on chronic opioid making the possibility of SIADH also likely - IV diuresing as above - Check urine sodium and urine osm - F/u repeat sodium  # BLE edema - Patient with progressive lower extremity edema over the last 2 weeks - Likely secondary to heart failure exacerbation but he does have some tenderness to palpation of the lower extremity - Check VAS U/S LE DVT study to rule out lower extremity DVT - Apply TED hose  # Atrial fibrillation - Remains in A-fib but rate controlled - Hold Pradaxa  in the setting of GI bleed - Telemetry  # Chronic back pain # Chronic pain syndrome - Continue MS Contin   # Chronic diarrhea - Continue  cholestyramine   # GERD - IV PPI as above   DVT prophylaxis: TED hose    Code Status: Full Code  Consults called: GI  Family Communication: Discussed results/findings and plan for admission with spouse at bedside  Severity of Illness: The appropriate patient status for this patient is INPATIENT. Inpatient  status is judged to be reasonable and necessary in order to provide the required intensity of service to ensure the patient's safety. The patient's presenting symptoms, physical exam findings, and initial radiographic and laboratory data in the context of their chronic comorbidities is felt to place them at high risk for further clinical deterioration. Furthermore, it is not anticipated that the patient will be medically stable for discharge from the hospital within 2 midnights of admission.   * I certify that at the point of admission it is my clinical judgment that the patient will require inpatient hospital care spanning beyond 2 midnights from the point of admission due to high intensity of service, high risk for further deterioration and high frequency of surveillance required.*  Level of care: Telemetry   I personally spent a total of 75 minutes in the care of the patient today including preparing to see the patient, getting/reviewing separately obtained history, performing a medically appropriate exam/evaluation, placing orders, documenting clinical information in the EHR, independently interpreting results, and communicating results.   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

## 2024-06-28 NOTE — ED Triage Notes (Signed)
 Pt via POV sent by PCP after blood work drawn yesterday showed Hgb 6.7. Pt endorses mild dizziness and notes dark red stool since Christmas.

## 2024-06-28 NOTE — ED Provider Notes (Signed)
 " [place] EMERGENCY DEPARTMENT AT [facility] Provider Note   CSN: [id number] Arrival date & time: 06/28/24  1244     Patient presents with: Abnormal Lab and GI Bleeding   Kent [name] is a 84 y.o. male.  Patient with past history significant for SBO, GERD, chronic pain, atrial fibrillation, hypertension presents to the emergency department today with concerns of anemia and possible GI bleed.  Reports that he had blood work drawn by PCP yesterday showing hemoglobin was 6.7.  Dors that he been feeling somewhat dizzy and lightheaded.  He is also noted some maroon-colored stool for the last 2 weeks starting around Christmas time.  Denies history of GI bleed.  He is on Pradaxa .  Reports he follows Dr. Rosalie with [facility].  Denies heavy aspirin , ibuprofen, or alcohol  use.   Abnormal Lab      Prior to Admission medications  Medication Sig Start Date End Date Taking? Authorizing Provider  cholestyramine  (QUESTRAN ) 4 g packet Take 4 g by mouth in the morning and at bedtime. 06/26/23  Yes [provider]  dabigatran  (PRADAXA ) 150 MG CAPS capsule Take 1 capsule (150 mg total) by mouth 2 (two) times daily. NEEDS CARDIOLOGY APPT FOR REFILLS, CALL OFFICE [phone]. THANK YOU. Patient taking differently: Take 150 mg by mouth in the morning and at bedtime. 06/07/24  Yes Fenton, Clint R, PA  morphine  (MS CONTIN ) 30 MG 12 hr tablet Take 30 mg by mouth every 12 (twelve) hours. 01/20/18  Yes [provider]    Allergies: Aspirin     Review of Systems  Gastrointestinal:        Dark stool  All other systems reviewed and are negative.   Updated Vital Signs BP (!) 123/59   Pulse 67   Temp 98.3 F (36.8 C) (Oral)   Resp 15   Ht 5' 10 (1.778 m)   Wt 71.2 kg   SpO2 92%   BMI 22.53 kg/m   Physical Exam Vitals and nursing note reviewed.  Constitutional:      General: He is not in acute distress.    Appearance: He is well-developed.  HENT:     Head:  Normocephalic and atraumatic.  Eyes:     Conjunctiva/sclera: Conjunctivae normal.  Cardiovascular:     Rate and Rhythm: Normal rate and regular rhythm.     Heart sounds: No murmur heard. Pulmonary:     Effort: Pulmonary effort is normal. No respiratory distress.     Breath sounds: Normal breath sounds.  Abdominal:     General: There is no distension.     Palpations: Abdomen is soft.     Tenderness: There is no abdominal tenderness. There is no guarding.  Genitourinary:    Rectum: Normal.     Comments: No appreciable hemorrhoids on exam. No active rectal bleeding. Musculoskeletal:        General: No swelling.     Cervical back: Neck supple.  Skin:    General: Skin is warm and dry.     Capillary Refill: Capillary refill takes less than 2 seconds.  Neurological:     Mental Status: He is alert.  Psychiatric:        Mood and Affect: Mood normal.     (all labs ordered are listed, but only abnormal results are displayed) Labs Reviewed  COMPREHENSIVE METABOLIC PANEL WITH GFR - Abnormal; Notable for the following components:      Result Value   Sodium 129 (*)    Glucose,  Bld 109 (*)    Creatinine, Ser 1.26 (*)    Calcium 8.7 (*)    Total Protein 6.4 (*)    Albumin  3.4 (*)    GFR, Estimated 57 (*)    All other components within normal limits  CBC - Abnormal; Notable for the following components:   WBC 3.8 (*)    RBC 2.37 (*)    Hemoglobin 6.1 (*)    HCT 19.6 (*)    MCH 25.7 (*)    All other components within normal limits  PROTIME-INR - Abnormal; Notable for the following components:   Prothrombin Time 23.1 (*)    INR 1.9 (*)    All other components within normal limits  BASIC METABOLIC PANEL WITH GFR  CBC  OSMOLALITY, URINE  SODIUM, URINE, RANDOM  POC OCCULT BLOOD, ED  TYPE AND SCREEN  PREPARE RBC (CROSSMATCH)    EKG: EKG Interpretation Date/Time:  Tuesday June 28 2024 13:04:29 EST Ventricular Rate:  85 PR Interval:    QRS Duration:  101 QT  Interval:  400 QTC Calculation: 426 R Axis:   32  Text Interpretation: Atrial fibrillation Ventricular premature complex Nonspecific T abnormalities, inferior leads Confirmed by Yolande Charleston [phone]) on 06/28/2024 4:53:25 PM  Radiology: No results found.   .Critical Care  Performed by: Cecily Legrand LABOR, PA-C Authorized by: Arav Bannister A, PA-C   Critical care provider statement:    Critical care time (minutes):  54   Critical care start time:  06/28/2024 3:00 PM   Critical care end time:  06/28/2024 3:54 PM   Critical care time was exclusive of:  Separately billable procedures and treating other patients   Critical care was time spent personally by me on the following activities:  Development of treatment plan with patient or surrogate, discussions with consultants, evaluation of patient's response to treatment, examination of patient, ordering and review of laboratory studies, re-evaluation of patient's condition and review of old charts   I assumed direction of critical care for this patient from another provider in my specialty: no     Care discussed with: admitting provider      Medications Ordered in the ED  0.9 %  sodium chloride  infusion (Manually program via Guardrails IV Fluids) (has no administration in time range)  acetaminophen  (TYLENOL ) tablet 650 mg (has no administration in time range)    Or  acetaminophen  (TYLENOL ) suppository 650 mg (has no administration in time range)  senna-docusate (Senokot-S) tablet 1 tablet (has no administration in time range)  bisacodyl  (DULCOLAX) EC tablet 5 mg (has no administration in time range)  ondansetron  (ZOFRAN ) tablet 4 mg (has no administration in time range)    Or  ondansetron  (ZOFRAN ) injection 4 mg (has no administration in time range)  pantoprazole  (PROTONIX ) injection 40 mg (has no administration in time range)    Clinical Course as of 06/28/24 1747  Tue Jun 28, 2024  1634 Spoke with Dr. Dianna, GI, who will have GI  consult on him tomorrow and have him admitted. [OZ]    Clinical Course User Index [OZ] Cecily Legrand LABOR, PA-C                                 Medical Decision Making Amount and/or Complexity of Data Reviewed Labs: ordered.  Risk Prescription drug management. Decision regarding hospitalization.   This patient presents to the ED for concern of abnormal labs, GI bleed, this involves an  extensive number of treatment options, and is a complaint that carries with it a high risk of complications and morbidity.  The differential diagnosis includes symptomatic anemia, lower GI bleed, gastroenteritis, bowel obstruction   Co morbidities that complicate the patient evaluation  SBO, GERD, chronic pain, atrial fibrillation, hypertension   Additional history obtained:  Additional history obtained from chart review   Lab Tests:  I Ordered, and personally interpreted labs.  The pertinent results include: CBC shows downtrending hemoglobin at 6.1, CMP with slightly improved creatinine but sodium at 129, PT/INR shows INR at 1.9, type x-ray shows a positive blood, fecal occult pending lab interpretation but not grossly positive   Cardiac Monitoring: / EKG:  The patient was maintained on a cardiac monitor.  I personally viewed and interpreted the cardiac monitored which showed an underlying rhythm of: Atrial fibrillation   Consultations Obtained:  I requested consultation with the gastroenterologist, hospitalist,  and discussed lab and imaging findings as well as pertinent plan - they recommend: Spoke with Dr.Schooler, [facility], who advised that GI will consult on him tomorrow morning.  Spoke with Dr. Lou, hospitalist, who will be admitting patient.   Problem List / ED Course / Critical interventions / Medication management  Patient with past history significant for SBO, GERD, chronic pain, atrial fibrillation, hypertension presents ED with concerns of anemia and possible GI bleed.   Reports that he had blood work performed by PCP yesterday and there is concern for declining hemoglobin at 6.7 yesterday.  He reports that he is on dabigatran  for atrial fibrillation.  Has been taking his medication as prescribed.  He reports maroon-colored stool for about 2 weeks prior to having blood work performed yesterday.  He denies any gross or frank bleeding per rectum or hematemesis.  Denies any significant abdominal or rectal pain. On exam, patient has no focal abdominal tenderness.  Chaperoned rectal exam reveals no obvious hemorrhoid, frank blood, or significant discomfort. Workup today shows downtrending hemoglobin now at 6.1.  1 unit of blood ordered for repletion for suspected GI bleed.  Based on exam, difficult to fully determine if patient has obvious bleeding in GI tract but given his report maroon-colored stool, suspect this is likely.  Will consult GI for recommendations and for likely consult but require admission either way. Spoke with Dr. Dianna, GI, who advised that they will consult on patient in the morning.  Spoke with Dr. Lou, hospitalist, who admitting patient. I ordered medication including 1 unit PRBC for suspected GI bleed, anemia Reevaluation of the patient after these medicines showed that the patient improved I have reviewed the patients home medicines and have made adjustments as needed   Social Determinants of Health:  None   Test / Admission - Considered:  Patient requiring admission.  Final diagnoses:  Gastrointestinal hemorrhage, unspecified gastrointestinal hemorrhage type  Symptomatic anemia    ED Discharge Orders     None          Cecily Legrand LABOR, PA-C 06/28/24 1747  "

## 2024-06-28 NOTE — ED Provider Triage Note (Signed)
 Emergency Medicine Provider Triage Evaluation Note  Kent [name] , a 84 y.o. male  was evaluated in triage.  Pt complains of GI bleed.  Patient reportedly has had maroon-colored stool for the last 2 weeks since Christmas.  He is on a blood thinner for history of A-fib.  Denies any heavy NSAID use or alcohol  use recently.  No history of GI bleed as far as he can recall.  His GI doctor is Dr. Rosalie.  Review of Systems  Positive: As above Negative: As above  Physical Exam  BP (!) 129/52 (BP Location: Left Arm)   Pulse 87   Temp 98.2 F (36.8 C) (Oral)   Resp 17   Ht 5' 10 (1.778 m)   Wt 71.2 kg   SpO2 97%   BMI 22.53 kg/m  Gen:   Awake, no distress  Resp:  Normal effort  MSK:   Moves extremities without difficulty  Other:  No focal abdominal tenderness  Medical Decision Making  Medically screening exam initiated at 1:31 PM.  Appropriate orders placed.  Kent [name] was informed that the remainder of the evaluation will be completed by another provider, this initial triage assessment does not replace that evaluation, and the importance of remaining in the ED until their evaluation is complete.     Hasson Gaspard A, PA-C 06/28/24 1333

## 2024-06-28 NOTE — ED Notes (Signed)
 Blood consent signed electronically. ?

## 2024-06-29 ENCOUNTER — Inpatient Hospital Stay (HOSPITAL_COMMUNITY)

## 2024-06-29 ENCOUNTER — Telehealth (HOSPITAL_COMMUNITY): Payer: Self-pay | Admitting: Pharmacist

## 2024-06-29 ENCOUNTER — Telehealth (HOSPITAL_COMMUNITY): Payer: Self-pay | Admitting: Pharmacy Technician

## 2024-06-29 DIAGNOSIS — R0602 Shortness of breath: Secondary | ICD-10-CM | POA: Diagnosis not present

## 2024-06-29 DIAGNOSIS — M7989 Other specified soft tissue disorders: Secondary | ICD-10-CM

## 2024-06-29 DIAGNOSIS — K922 Gastrointestinal hemorrhage, unspecified: Secondary | ICD-10-CM | POA: Diagnosis not present

## 2024-06-29 LAB — BASIC METABOLIC PANEL WITH GFR
Anion gap: 8 (ref 5–15)
BUN: 18 mg/dL (ref 8–23)
CO2: 27 mmol/L (ref 22–32)
Calcium: 8.6 mg/dL — ABNORMAL LOW (ref 8.9–10.3)
Chloride: 101 mmol/L (ref 98–111)
Creatinine, Ser: 1.57 mg/dL — ABNORMAL HIGH (ref 0.61–1.24)
GFR, Estimated: 44 mL/min — ABNORMAL LOW
Glucose, Bld: 113 mg/dL — ABNORMAL HIGH (ref 70–99)
Potassium: 4.3 mmol/L (ref 3.5–5.1)
Sodium: 135 mmol/L (ref 135–145)

## 2024-06-29 LAB — CBC
HCT: 22.4 % — ABNORMAL LOW (ref 39.0–52.0)
HCT: 23.4 % — ABNORMAL LOW (ref 39.0–52.0)
HCT: 23.2 % — ABNORMAL LOW (ref 39.0–52.0)
Hemoglobin: 7.2 g/dL — ABNORMAL LOW (ref 13.0–17.0)
Hemoglobin: 7.4 g/dL — ABNORMAL LOW (ref 13.0–17.0)
Hemoglobin: 7.4 g/dL — ABNORMAL LOW (ref 13.0–17.0)
MCH: 26.3 pg (ref 26.0–34.0)
MCH: 26 pg (ref 26.0–34.0)
MCH: 26.2 pg (ref 26.0–34.0)
MCHC: 32.1 g/dL (ref 30.0–36.0)
MCHC: 31.6 g/dL (ref 30.0–36.0)
MCHC: 31.9 g/dL (ref 30.0–36.0)
MCV: 81.8 fL (ref 80.0–100.0)
MCV: 82.1 fL (ref 80.0–100.0)
MCV: 82.3 fL (ref 80.0–100.0)
Platelets: 198 K/uL (ref 150–400)
Platelets: 199 K/uL (ref 150–400)
Platelets: 205 K/uL (ref 150–400)
RBC: 2.74 MIL/uL — ABNORMAL LOW (ref 4.22–5.81)
RBC: 2.85 MIL/uL — ABNORMAL LOW (ref 4.22–5.81)
RBC: 2.82 MIL/uL — ABNORMAL LOW (ref 4.22–5.81)
RDW: 14.8 % (ref 11.5–15.5)
RDW: 14.9 % (ref 11.5–15.5)
RDW: 14.9 % (ref 11.5–15.5)
WBC: 3.8 K/uL — ABNORMAL LOW (ref 4.0–10.5)
WBC: 4.3 K/uL (ref 4.0–10.5)
WBC: 3.9 K/uL — ABNORMAL LOW (ref 4.0–10.5)
nRBC: 0 % (ref 0.0–0.2)
nRBC: 0 % (ref 0.0–0.2)
nRBC: 0 % (ref 0.0–0.2)

## 2024-06-29 LAB — ECHOCARDIOGRAM COMPLETE
Area-P 1/2: 3.72 cm2
Calc EF: 58.5 %
Height: 70 in
MV M vel: 5.69 m/s
MV Peak grad: 129.5 mmHg
Radius: 0.35 cm
S' Lateral: 3.5 cm
Single Plane A2C EF: 56.5 %
Single Plane A4C EF: 60.6 %
Weight: 2507.95 [oz_av]

## 2024-06-29 LAB — IRON AND TIBC
Iron: 17 ug/dL — ABNORMAL LOW (ref 45–182)
Saturation Ratios: 5 % — ABNORMAL LOW (ref 17.9–39.5)
TIBC: 388 ug/dL (ref 250–450)
UIBC: 371 ug/dL

## 2024-06-29 LAB — MAGNESIUM: Magnesium: 2.1 mg/dL (ref 1.7–2.4)

## 2024-06-29 LAB — VITAMIN B12: Vitamin B-12: 577 pg/mL (ref 180–914)

## 2024-06-29 LAB — FERRITIN: Ferritin: 14 ng/mL — ABNORMAL LOW (ref 24–336)

## 2024-06-29 LAB — FOLATE: Folate: >20 ng/mL

## 2024-06-29 MED ORDER — METHOCARBAMOL 500 MG PO TABS: 500.0000 mg | ORAL_TABLET | Freq: Four times a day (QID) | ORAL | Status: DC | PRN

## 2024-06-29 MED ORDER — SODIUM CHLORIDE 0.9 % IV SOLN
200.0000 mg | Freq: Once | INTRAVENOUS | Status: AC
  Administered 2024-06-29: 200 mg via INTRAVENOUS
  Filled 2024-06-29: qty 200

## 2024-06-29 MED ORDER — FLEET ENEMA RE ENEM
1.0000 | ENEMA | Freq: Once | RECTAL | Status: AC
  Administered 2024-06-29: 1 via RECTAL
  Filled 2024-06-29: qty 1

## 2024-06-29 MED ORDER — SODIUM CHLORIDE 0.9 % IV SOLN: INTRAVENOUS | Status: AC

## 2024-06-29 NOTE — Plan of Care (Signed)

## 2024-06-29 NOTE — Plan of Care (Signed)

## 2024-06-29 NOTE — Telephone Encounter (Signed)
 Auth Submission: NO AUTH NEEDED Site of care: [facility] Payer: Medicare A/B, [employer] Supp   Medication & CPT/J Code(s) submitted: Feraheme (ferumoxytol) [id number] Diagnosis Code: K92.2, D50.9, D64.9 Route of submission (phone, fax, portal):  Phone # Fax # Auth type: Buy/Bill HB Units/visits requested: 510mg  x 1 dose Reference number:  Approval from: 06/29/2024 to 08/27/24    Dagoberto Armour, CPhT [facility] Phone: [phone] 06/29/2024

## 2024-06-29 NOTE — Progress Notes (Signed)
" °  Echocardiogram 2D Echocardiogram has been performed.  Devora Ellouise SAUNDERS 06/29/2024, 2:41 PM "

## 2024-06-29 NOTE — Addendum Note (Signed)
 Addended by: DAYNE SHERRY RAMAN on: 06/29/2024 12:21 PM   Modules accepted: Orders

## 2024-06-29 NOTE — Progress Notes (Signed)
 [facility] Progress Note Patient: Kent [name] FMW:[id number] DOB: July 13, 1940  DOA: 06/28/2024 DOS: the patient was seen and examined on 06/29/2024  Brief Hospital Course: Patient with PMH of chronic A-fib on Pradaxa , GERD, chronic pain, osteoarthritis, HTN presented to the hospital with complaints of abnormal lab. Since Christmas he had blood in his stool. He went to see the PCP and was found to have hemoglobin of 6.7 and therefore was asked to come to the hospital for further workup. [facility] consulted.  Now scheduled for flexible sigmoidoscopy tomorrow.  Assessment and Plan: Acute GI bleeding. So far appears that the bleeding has stopped. H&H stable. GI consulted. Flexible sigmoidoscopy tomorrow. Monitor.  Symptomatic anemia. Hemoglobin of 6.1 on admission. Transfused 1 PRBC transfusion. Monitor H&H and CBC.  Muscle cramps. Reports that he has chronic muscle cramps. Drinks 5 to 16 ounces of water bottle at nighttime. Add Robaxin . Provide gentle IV hydration.  Right leg edema. Concern for acute on chronic diastolic CHF. Echocardiogram recently showed EF of 65%. Does not appear to be volume overloaded right now. Treated with IV Lasix  yesterday. Lower extremity Doppler ordered. Monitor weight  Hyponatremia. Corrected. Monitor.  Chronic A-fib. Currently rate controlled. On Pradaxa  at home. Continue to hold.  Subjective: Reports severe muscle cramps all over his body.  No nausea no vomiting.  Reports mild abdominal discomfort in the supraumbilical region.  Had a BM which was brown in color.  Otherwise other bowel movement yesterday were bright red blood.  Physical Exam: General: in Mild distress, No Rash Cardiovascular: S1 and S2 Present, No Murmur Respiratory: Good respiratory effort, Bilateral Air entry present. No Crackles, No wheezes Abdomen: Bowel Sound present, No tenderness Extremities: Right edema Neuro: Alert and oriented x3, no new focal deficit    Data Reviewed: I have Reviewed nursing notes, Vitals, and Lab results. Since last encounter, pertinent lab results CBC and BMP   . I have ordered test including CBC and BMP  . I have discussed pt's care plan and test results with GI  .   Disposition: Status is: Inpatient Remains inpatient appropriate because: Awaiting stability of H&H as well as flexible sigmoidoscopy tomorrow. Vitals:   06/28/24 2200 06/29/24 0128 06/29/24 0556 06/29/24 1528  BP:  (!) 106/50 (!) 117/58 (!) 121/57  Pulse:  75 68 (!) 55  Resp:  18 17 18   Temp:  98.3 F (36.8 C) 97.9 F (36.6 C) 97.9 F (36.6 C)  TempSrc:  Oral Oral Oral  SpO2:  100% 100% 100%  Weight: 71.1 kg     Height:         Author: Yetta Blanch, MD 06/29/2024 5:42 PM  Please look on [email or web address] to find out who is on call.

## 2024-06-29 NOTE — Progress Notes (Signed)
 Bilateral lower extremity venous duplex has been completed. Preliminary results can be found in CV Proc through chart review.   06/29/2024 3:31 PM Cathlyn Collet RVT

## 2024-06-29 NOTE — Telephone Encounter (Addendum)
 Patient referred to infusion pharmacy team for ambulatory infusion of IV iron .  Insurance - Medicare A/B + BCBS Supplement Site of care - Site of care: [facility] Dx code - D50.9 IV Iron  Therapy - Feraheme 510mg  x 1. Venofer  200mg  x 1 scheduled for inpatient admin today. Infusion appointments - Scheduling team will schedule patient as soon as possible.   Sherry Pennant, PharmD, MPH, BCPS, CPP Clinical Pharmacist

## 2024-06-29 NOTE — Consult Note (Signed)
 Referring Provider: Dr. Tobie Primary Care Physician:  Arloa Elsie SAUNDERS, MD Primary Gastroenterologist:  Dr. Rosalie  Reason for Consultation:  Rectal bleeding  HPI: Kent [name] is a 84 y.o. male with Afib on Pradaxa  who has been having recurrent red blood per rectum since Christmas. Denies associated abdominal pain, N/V. Denies melena. Last rectal bleeding occurred last night. Hgb 6.1 on admit yesterday. Hgb 7.4 today. S/P subtotal colectomy in 2024. Flex sig in 2024 showed hemorrhoids and normal-appearing anastomosis.  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       Cancer (HCC)    basal cell cancer - skin   Chronic back pain    s/p back surgery   Dysrhythmia    GERD (gastroesophageal reflux disease) 08/18/2013   Incisional hernia, without obstruction or gangrene 02/02/2013   Postural dizziness with presyncope 09/01/2016   Sinus infection    currently on antibiotics   Small bowel obstruction due to adhesions [facility])     Past Surgical History:  Procedure Laterality Date   BACK SURGERY  08/25/2001   Neck 3 disk replacements   BACK SURGERY     Placement/removal of nerve stimulator   CHOLECYSTECTOMY N/A 10/16/2014   Procedure: LAPAROSCOPIC CHOLECYSTECTOMY WITH ATTEMPTED CHOLANGIOGRAM;  Surgeon: Dann Hummer, MD;  Location: [facility] OR;  Service: General;  Laterality: N/A;   COLON SURGERY  04/2002   Dr. Effie - Subtotal colectomy, ileostomy   ELBOW SURGERY Right    for blood vessel &  hung nerve   ESOPHAGEAL DILATION     EXPLORATORY LAPAROTOMY  03/10/2017   HEMORRHOID SURGERY N/A 05/25/2023   Procedure: HEMORRHOIDECTOMY INTERNAL;  Surgeon: Teresa Lonni HERO, MD;  Location: [facility] ORS;  Service: General;  Laterality: N/A;   HERNIA REPAIR     Ileostomy Takedown  08/2002   Dr. Effie   INCISION AND DRAINAGE FOOT     INCISIONAL HERNIA REPAIR N/A 01/20/2013   Procedure: HERNIA REPAIR INCISIONAL;  Surgeon: Dann FORBES Hummer, MD;  Location: [facility] OR;  Service:  General;  Laterality: N/A;   INCISIONAL HERNIA REPAIR  04/13/2014   with mesh    dr hummer COAST HERNIA REPAIR N/A 04/13/2014   Procedure: OPEN REPAIR INCISIONAL HERNIA ;  Surgeon: Dann Hummer, MD;  Location: [facility] OR;  Service: General;  Laterality: N/A;   INSERTION OF MESH N/A 04/13/2014   Procedure: INSERTION OF MESH;  Surgeon: Dann Hummer, MD;  Location: [facility] OR;  Service: General;  Laterality: N/A;   KNEE ARTHROSCOPY Right 2010   LAPAROSCOPIC CHOLECYSTECTOMY  10/16/2014   LAPAROTOMY N/A 01/20/2013   Procedure: EXPLORATORY LAPAROTOMY;  Surgeon: Dann FORBES Hummer, MD;  Location: [facility] OR;  Service: General;  Laterality: N/A;   LAPAROTOMY N/A 03/10/2017   Procedure: EXPLORATORY LAPAROTOMY;  Surgeon: Hummer Dann, MD;  Location: [facility] OR;  Service: General;  Laterality: N/A;   Laparotomy with lysis of adhesions  2010   Dr. Hummer - [facility]   LYSIS OF ADHESION N/A 01/20/2013   Procedure: LYSIS OF ADHESION;  Surgeon: Dann FORBES Hummer, MD;  Location[facility] OR;  Service: General;  Laterality: N/A;   LYSIS OF ADHESION N/A 03/10/2017   Procedure: LYSIS OF ADHESION;  Surgeon: Hummer Dann, MD;  Location: [facility] OR;  Service: General;  Laterality: N/A;   microsurgical  03/24/2002   Lateral recess decompression L5   SACRAL NERVE STIMULATOR PLACEMENT     inserted and removed   SHOULDER ARTHROSCOPY Bilateral    VASECTOMY  Prior to Admission medications  Medication Sig Start Date End Date Taking? Authorizing Provider  cholestyramine  (QUESTRAN ) 4 g packet Take 4 g by mouth in the morning and at bedtime. 06/26/23  Yes [provider]  dabigatran  (PRADAXA ) 150 MG CAPS capsule Take 1 capsule (150 mg total) by mouth 2 (two) times daily. NEEDS CARDIOLOGY APPT FOR REFILLS, CALL OFFICE [phone]. THANK YOU. Patient taking differently: Take 150 mg by mouth in the morning and at bedtime. 06/07/24  Yes Fenton, Clint R, PA  morphine  (MS CONTIN ) 30 MG 12 hr tablet Take 30 mg by mouth every 12  (twelve) hours. 01/20/18  Yes [provider]    Scheduled Meds:  cholestyramine  light  4 g Oral BID   morphine   30 mg Oral Q12H   pantoprazole  (PROTONIX ) IV  40 mg Intravenous Q12H   sodium phosphate   1 enema Rectal Once   Continuous Infusions:  sodium chloride  50 mL/hr at 06/29/24 1017   PRN Meds:.acetaminophen  **OR** acetaminophen , bisacodyl , methocarbamol , ondansetron  **OR** ondansetron  (ZOFRAN ) IV, senna-docusate  Allergies as of 06/28/2024 - Review Complete 06/28/2024  Allergen Reaction Noted   Aspirin  Other (See Comments) 08/26/2022    Family History  Problem Relation Age of Onset   Heart disease Mother    Brain cancer Father     Social History   Socioeconomic History   Marital status: Married    Spouse name: Not on file   Number of children: Not on file   Years of education: Not on file   Highest education level: Not on file  Occupational History   Not on file  Tobacco Use   Smoking status: Former   Smokeless tobacco: Former    Types: Chew    Quit date: 03/05/1986  Vaping Use   Vaping status: Never Used  Substance and Sexual Activity   Alcohol  use: No   Drug use: No   Sexual activity: Not on file  Other Topics Concern   Not on file  Social History Narrative   Married for 45 years, 3 granddaughters   Social Drivers of Health   Tobacco Use: Medium Risk (06/28/2024)   Patient History    Smoking Tobacco Use: Former    Smokeless Tobacco Use: Former    Passive Exposure: Not on Actuary Strain: Low Risk (11/23/2023)   Received from [facility]   Overall Financial Resource Strain (CARDIA)    Difficulty of Paying Living Expenses: Not very hard  Food Insecurity: No Food Insecurity (06/28/2024)   Epic    Worried About Radiation Protection Practitioner of Food in the Last Year: Never true    Ran Out of Food in the Last Year: Never true  Transportation Needs: No Transportation Needs (06/28/2024)   Epic    Lack of Transportation (Medical): No    Lack of  Transportation (Non-Medical): No  Physical Activity: Not on file  Stress: Not on file  Social Connections: Moderately Integrated (06/28/2024)   Social Connection and Isolation Panel    Frequency of Communication with Friends and Family: Once a week    Frequency of Social Gatherings with Friends and Family: Once a week    Attends Religious Services: More than 4 times per year    Active Member of [employer] or Organizations: Yes    Attends Banker Meetings: Never    Marital Status: Married  Catering Manager Violence: Not At Risk (06/28/2024)   Epic    Fear of Current or Ex-Partner: No    Emotionally Abused: No  Physically Abused: No    Sexually Abused: No  Depression (PHQ2-9): Not on file  Alcohol  Screen: Not on file  Housing: Low Risk (06/28/2024)   Epic    Unable to Pay for Housing in the Last Year: No    Number of Times Moved in the Last Year: 0    Homeless in the Last Year: No  Utilities: Not At Risk (06/28/2024)   Epic    Threatened with loss of utilities: No  Health Literacy: Not on file    Review of Systems: All negative except as stated above in HPI.  Physical Exam: Vital signs: Vitals:   06/29/24 0128 06/29/24 0556  BP: (!) 106/50 (!) 117/58  Pulse: 75 68  Resp: 18 17  Temp: 98.3 F (36.8 C) 97.9 F (36.6 C)  SpO2: 100% 100%   Last BM Date : 06/28/24 General:   Alert,  thin, elderly, no acute distress, pleasant  Head: normocephalic, atraumatic Eyes: anicteric sclera ENT: oropharynx clear Neck: supple, nontender Lungs:  Clear throughout to auscultation.   No wheezes, crackles, or rhonchi. No acute distress. Heart:  Regular rate and rhythm; no murmurs, clicks, rubs,  or gallops. Abdomen: soft, nontender, nondistended, +BS  Rectal:  Deferred Ext: no edema  GI:  Lab Results: Recent Labs    06/28/24 1314 06/29/24 0320 06/29/24 0834  WBC 3.8* 3.8* 4.3  HGB 6.1* 7.2* 7.4*  HCT 19.6* 22.4* 23.4*  PLT 228 198 199   BMET Recent Labs     06/28/24 1314 06/29/24 0320  NA 129* 135  K 4.1 4.3  CL 98 101  CO2 24 27  GLUCOSE 109* 113*  BUN 17 18  CREATININE 1.26* 1.57*  CALCIUM 8.7* 8.6*   LFT Recent Labs    06/28/24 1314  PROT 6.4*  ALBUMIN  3.4*  AST 22  ALT 12  ALKPHOS 64  BILITOT 0.5   PT/INR Recent Labs    06/28/24 1314  LABPROT 23.1*  INR 1.9*     Impression/Plan: Rectal bleeding  in setting of Pradaxa  - EGD and flex sig tomorrow to further evaluate. Clear liquid diet. NPO p MN. Supportive care. Hold Pradaxa .    LOS: 1 day   [name]  06/29/2024, 12:47 PM  Questions please call [phone]

## 2024-06-29 NOTE — H&P (View-Only) (Signed)
 Referring Provider: Dr. Tobie Primary Care Physician:  Arloa Elsie SAUNDERS, MD Primary Gastroenterologist:  Dr. Rosalie  Reason for Consultation:  Rectal bleeding  HPI: Kent [name] is a 84 y.o. male with Afib on Pradaxa  who has been having recurrent red blood per rectum since Christmas. Denies associated abdominal pain, N/V. Denies melena. Last rectal bleeding occurred last night. Hgb 6.1 on admit yesterday. Hgb 7.4 today. S/P subtotal colectomy in 2024. Flex sig in 2024 showed hemorrhoids and normal-appearing anastomosis.  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       Cancer (HCC)    basal cell cancer - skin   Chronic back pain    s/p back surgery   Dysrhythmia    GERD (gastroesophageal reflux disease) 08/18/2013   Incisional hernia, without obstruction or gangrene 02/02/2013   Postural dizziness with presyncope 09/01/2016   Sinus infection    currently on antibiotics   Small bowel obstruction due to adhesions [facility])     Past Surgical History:  Procedure Laterality Date   BACK SURGERY  08/25/2001   Neck 3 disk replacements   BACK SURGERY     Placement/removal of nerve stimulator   CHOLECYSTECTOMY N/A 10/16/2014   Procedure: LAPAROSCOPIC CHOLECYSTECTOMY WITH ATTEMPTED CHOLANGIOGRAM;  Surgeon: Dann Hummer, MD;  Location: [facility] OR;  Service: General;  Laterality: N/A;   COLON SURGERY  04/2002   Dr. Effie - Subtotal colectomy, ileostomy   ELBOW SURGERY Right    for blood vessel &  hung nerve   ESOPHAGEAL DILATION     EXPLORATORY LAPAROTOMY  03/10/2017   HEMORRHOID SURGERY N/A 05/25/2023   Procedure: HEMORRHOIDECTOMY INTERNAL;  Surgeon: Teresa Lonni HERO, MD;  Location: [facility] ORS;  Service: General;  Laterality: N/A;   HERNIA REPAIR     Ileostomy Takedown  08/2002   Dr. Effie   INCISION AND DRAINAGE FOOT     INCISIONAL HERNIA REPAIR N/A 01/20/2013   Procedure: HERNIA REPAIR INCISIONAL;  Surgeon: Dann FORBES Hummer, MD;  Location: [facility] OR;  Service:  General;  Laterality: N/A;   INCISIONAL HERNIA REPAIR  04/13/2014   with mesh    dr hummer COAST HERNIA REPAIR N/A 04/13/2014   Procedure: OPEN REPAIR INCISIONAL HERNIA ;  Surgeon: Dann Hummer, MD;  Location: [facility] OR;  Service: General;  Laterality: N/A;   INSERTION OF MESH N/A 04/13/2014   Procedure: INSERTION OF MESH;  Surgeon: Dann Hummer, MD;  Location: [facility] OR;  Service: General;  Laterality: N/A;   KNEE ARTHROSCOPY Right 2010   LAPAROSCOPIC CHOLECYSTECTOMY  10/16/2014   LAPAROTOMY N/A 01/20/2013   Procedure: EXPLORATORY LAPAROTOMY;  Surgeon: Dann FORBES Hummer, MD;  Location: [facility] OR;  Service: General;  Laterality: N/A;   LAPAROTOMY N/A 03/10/2017   Procedure: EXPLORATORY LAPAROTOMY;  Surgeon: Hummer Dann, MD;  Location: [facility] OR;  Service: General;  Laterality: N/A;   Laparotomy with lysis of adhesions  2010   Dr. Hummer - [facility]   LYSIS OF ADHESION N/A 01/20/2013   Procedure: LYSIS OF ADHESION;  Surgeon: Dann FORBES Hummer, MD;  Location: [facility] OR;  Service: General;  Laterality: N/A;   LYSIS OF ADHESION N/A 03/10/2017   Procedure: LYSIS OF ADHESION;  Surgeon: Hummer Dann, MD;  Location: [facility] OR;  Service: General;  Laterality: N/A;   microsurgical  03/24/2002   Lateral recess decompression L5   SACRAL NERVE STIMULATOR PLACEMENT     inserted and removed   SHOULDER ARTHROSCOPY Bilateral    VASECTOMY  Prior to Admission medications  Medication Sig Start Date End Date Taking? Authorizing Provider  cholestyramine  (QUESTRAN ) 4 g packet Take 4 g by mouth in the morning and at bedtime. 06/26/23  Yes [provider]  dabigatran  (PRADAXA ) 150 MG CAPS capsule Take 1 capsule (150 mg total) by mouth 2 (two) times daily. NEEDS CARDIOLOGY APPT FOR REFILLS, CALL OFFICE [phone]. THANK YOU. Patient taking differently: Take 150 mg by mouth in the morning and at bedtime. 06/07/24  Yes Fenton, Clint R, PA  morphine  (MS CONTIN ) 30 MG 12 hr tablet Take 30 mg by mouth every 12  (twelve) hours. 01/20/18  Yes [provider]    Scheduled Meds:  cholestyramine  light  4 g Oral BID   morphine   30 mg Oral Q12H   pantoprazole  (PROTONIX ) IV  40 mg Intravenous Q12H   sodium phosphate   1 enema Rectal Once   Continuous Infusions:  sodium chloride  50 mL/hr at 06/29/24 1017   PRN Meds:.acetaminophen  **OR** acetaminophen , bisacodyl , methocarbamol , ondansetron  **OR** ondansetron  (ZOFRAN ) IV, senna-docusate  Allergies as of 06/28/2024 - Review Complete 06/28/2024  Allergen Reaction Noted   Aspirin  Other (See Comments) 08/26/2022    Family History  Problem Relation Age of Onset   Heart disease Mother    Brain cancer Father     Social History   Socioeconomic History   Marital status: Married    Spouse name: Not on file   Number of children: Not on file   Years of education: Not on file   Highest education level: Not on file  Occupational History   Not on file  Tobacco Use   Smoking status: Former   Smokeless tobacco: Former    Types: Chew    Quit date: 03/05/1986  Vaping Use   Vaping status: Never Used  Substance and Sexual Activity   Alcohol  use: No   Drug use: No   Sexual activity: Not on file  Other Topics Concern   Not on file  Social History Narrative   Married for 45 years, 3 granddaughters   Social Drivers of Health   Tobacco Use: Medium Risk (06/28/2024)   Patient History    Smoking Tobacco Use: Former    Smokeless Tobacco Use: Former    Passive Exposure: Not on Actuary Strain: Low Risk (11/23/2023)   Received from [facility]   Overall Financial Resource Strain (CARDIA)    Difficulty of Paying Living Expenses: Not very hard  Food Insecurity: No Food Insecurity (06/28/2024)   Epic    Worried About Radiation Protection Practitioner of Food in the Last Year: Never true    Ran Out of Food in the Last Year: Never true  Transportation Needs: No Transportation Needs (06/28/2024)   Epic    Lack of Transportation (Medical): No    Lack of  Transportation (Non-Medical): No  Physical Activity: Not on file  Stress: Not on file  Social Connections: Moderately Integrated (06/28/2024)   Social Connection and Isolation Panel    Frequency of Communication with Friends and Family: Once a week    Frequency of Social Gatherings with Friends and Family: Once a week    Attends Religious Services: More than 4 times per year    Active Member of [employer] or Organizations: Yes    Attends Banker Meetings: Never    Marital Status: Married  Catering Manager Violence: Not At Risk (06/28/2024)   Epic    Fear of Current or Ex-Partner: No    Emotionally Abused: No  Physically Abused: No    Sexually Abused: No  Depression (PHQ2-9): Not on file  Alcohol  Screen: Not on file  Housing: Low Risk (06/28/2024)   Epic    Unable to Pay for Housing in the Last Year: No    Number of Times Moved in the Last Year: 0    Homeless in the Last Year: No  Utilities: Not At Risk (06/28/2024)   Epic    Threatened with loss of utilities: No  Health Literacy: Not on file    Review of Systems: All negative except as stated above in HPI.  Physical Exam: Vital signs: Vitals:   06/29/24 0128 06/29/24 0556  BP: (!) 106/50 (!) 117/58  Pulse: 75 68  Resp: 18 17  Temp: 98.3 F (36.8 C) 97.9 F (36.6 C)  SpO2: 100% 100%   Last BM Date : 06/28/24 General:   Alert,  thin, elderly, no acute distress, pleasant  Head: normocephalic, atraumatic Eyes: anicteric sclera ENT: oropharynx clear Neck: supple, nontender Lungs:  Clear throughout to auscultation.   No wheezes, crackles, or rhonchi. No acute distress. Heart:  Regular rate and rhythm; no murmurs, clicks, rubs,  or gallops. Abdomen: soft, nontender, nondistended, +BS  Rectal:  Deferred Ext: no edema  GI:  Lab Results: Recent Labs    06/28/24 1314 06/29/24 0320 06/29/24 0834  WBC 3.8* 3.8* 4.3  HGB 6.1* 7.2* 7.4*  HCT 19.6* 22.4* 23.4*  PLT 228 198 199   BMET Recent Labs     06/28/24 1314 06/29/24 0320  NA 129* 135  K 4.1 4.3  CL 98 101  CO2 24 27  GLUCOSE 109* 113*  BUN 17 18  CREATININE 1.26* 1.57*  CALCIUM 8.7* 8.6*   LFT Recent Labs    06/28/24 1314  PROT 6.4*  ALBUMIN  3.4*  AST 22  ALT 12  ALKPHOS 64  BILITOT 0.5   PT/INR Recent Labs    06/28/24 1314  LABPROT 23.1*  INR 1.9*     Impression/Plan: Rectal bleeding  in setting of Pradaxa  - EGD and flex sig tomorrow to further evaluate. Clear liquid diet. NPO p MN. Supportive care. Hold Pradaxa .    LOS: 1 day   [name]  06/29/2024, 12:47 PM  Questions please call [phone]

## 2024-06-30 ENCOUNTER — Encounter (HOSPITAL_COMMUNITY): Payer: Self-pay | Admitting: Student

## 2024-06-30 ENCOUNTER — Inpatient Hospital Stay (HOSPITAL_COMMUNITY)

## 2024-06-30 ENCOUNTER — Encounter (HOSPITAL_COMMUNITY)
Admission: EM | Disposition: A | Discharge status: Home / Self Care | Payer: Self-pay | Source: Home / Self Care | Attending: Internal Medicine

## 2024-06-30 DIAGNOSIS — K922 Gastrointestinal hemorrhage, unspecified: Secondary | ICD-10-CM | POA: Diagnosis not present

## 2024-06-30 DIAGNOSIS — I5033 Acute on chronic diastolic (congestive) heart failure: Secondary | ICD-10-CM

## 2024-06-30 DIAGNOSIS — I110 Hypertensive heart disease with heart failure: Secondary | ICD-10-CM

## 2024-06-30 DIAGNOSIS — K649 Unspecified hemorrhoids: Secondary | ICD-10-CM

## 2024-06-30 HISTORY — PX: ESOPHAGOGASTRODUODENOSCOPY: SHX5428

## 2024-06-30 HISTORY — PX: FLEXIBLE SIGMOIDOSCOPY: SHX5431

## 2024-06-30 LAB — BASIC METABOLIC PANEL WITH GFR
Anion gap: 5 (ref 5–15)
BUN: 13 mg/dL (ref 8–23)
CO2: 27 mmol/L (ref 22–32)
Calcium: 8.4 mg/dL — ABNORMAL LOW (ref 8.9–10.3)
Chloride: 104 mmol/L (ref 98–111)
Creatinine, Ser: 1.39 mg/dL — ABNORMAL HIGH (ref 0.61–1.24)
GFR, Estimated: 51 mL/min — ABNORMAL LOW
Glucose, Bld: 85 mg/dL (ref 70–99)
Potassium: 4.6 mmol/L (ref 3.5–5.1)
Sodium: 136 mmol/L (ref 135–145)

## 2024-06-30 LAB — CBC
HCT: 21.4 % — ABNORMAL LOW (ref 39.0–52.0)
HCT: 28.3 % — ABNORMAL LOW (ref 39.0–52.0)
Hemoglobin: 6.9 g/dL — CL (ref 13.0–17.0)
Hemoglobin: 9.1 g/dL — ABNORMAL LOW (ref 13.0–17.0)
MCH: 26.3 pg (ref 26.0–34.0)
MCH: 27.2 pg (ref 26.0–34.0)
MCHC: 32.2 g/dL (ref 30.0–36.0)
MCHC: 32.2 g/dL (ref 30.0–36.0)
MCV: 81.7 fL (ref 80.0–100.0)
MCV: 84.7 fL (ref 80.0–100.0)
Platelets: 187 K/uL (ref 150–400)
Platelets: 204 K/uL (ref 150–400)
RBC: 2.62 MIL/uL — ABNORMAL LOW (ref 4.22–5.81)
RBC: 3.34 MIL/uL — ABNORMAL LOW (ref 4.22–5.81)
RDW: 15 % (ref 11.5–15.5)
RDW: 15.1 % (ref 11.5–15.5)
WBC: 3.3 K/uL — ABNORMAL LOW (ref 4.0–10.5)
WBC: 3.7 K/uL — ABNORMAL LOW (ref 4.0–10.5)
nRBC: 0 % (ref 0.0–0.2)
nRBC: 0 % (ref 0.0–0.2)

## 2024-06-30 LAB — POCT I-STAT, CHEM 8
BUN: 10 mg/dL (ref 8–23)
Calcium, Ion: 1.16 mmol/L (ref 1.15–1.40)
Chloride: 102 mmol/L (ref 98–111)
Creatinine, Ser: 1.5 mg/dL — ABNORMAL HIGH (ref 0.61–1.24)
Glucose, Bld: 93 mg/dL (ref 70–99)
HCT: 29 % — ABNORMAL LOW (ref 39.0–52.0)
Hemoglobin: 9.9 g/dL — ABNORMAL LOW (ref 13.0–17.0)
Potassium: 4.3 mmol/L (ref 3.5–5.1)
Sodium: 138 mmol/L (ref 135–145)
TCO2: 26 mmol/L (ref 22–32)

## 2024-06-30 LAB — HEMOGLOBIN AND HEMATOCRIT, BLOOD
HCT: 28.3 % — ABNORMAL LOW (ref 39.0–52.0)
Hemoglobin: 9.1 g/dL — ABNORMAL LOW (ref 13.0–17.0)

## 2024-06-30 LAB — PREPARE RBC (CROSSMATCH)

## 2024-06-30 LAB — MAGNESIUM: Magnesium: 2.1 mg/dL (ref 1.7–2.4)

## 2024-06-30 MED ORDER — PROPOFOL 500 MG/50ML IV EMUL
INTRAVENOUS | Status: DC | PRN
  Administered 2024-06-30: 100 ug/kg/min via INTRAVENOUS
  Administered 2024-06-30: 50 mg via INTRAVENOUS

## 2024-06-30 MED ORDER — SODIUM CHLORIDE 0.9% IV SOLUTION: Freq: Once | INTRAVENOUS | Status: AC

## 2024-06-30 MED ORDER — LIDOCAINE 2% (20 MG/ML) 5 ML SYRINGE
INTRAMUSCULAR | Status: DC | PRN
  Administered 2024-06-30: 60 mg via INTRAVENOUS

## 2024-06-30 MED ORDER — PROPOFOL 500 MG/50ML IV EMUL
INTRAVENOUS | Status: AC
  Filled 2024-06-30: qty 50

## 2024-06-30 MED ORDER — PHENYLEPHRINE 80 MCG/ML (10ML) SYRINGE FOR IV PUSH (FOR BLOOD PRESSURE SUPPORT)
PREFILLED_SYRINGE | INTRAVENOUS | Status: DC | PRN
  Administered 2024-06-30: 80 ug via INTRAVENOUS
  Administered 2024-06-30: 160 ug via INTRAVENOUS

## 2024-06-30 MED ORDER — SODIUM CHLORIDE 0.9 % IV SOLN: INTRAVENOUS | Status: DC | PRN

## 2024-06-30 MED ORDER — SODIUM CHLORIDE 0.9 % IV SOLN: INTRAVENOUS | Status: DC

## 2024-06-30 NOTE — Anesthesia Procedure Notes (Addendum)
 Date/Time: 06/30/2024 1:53 PM  Performed by: Para Jerelene CROME, CRNAOxygen Delivery Method: Simple face mask Comments: POM Face Mask.

## 2024-06-30 NOTE — Progress Notes (Signed)
 Pt left the unit via wheelchair accompanied by Endo RN. Pt is alert and oriented, not in any distress.

## 2024-06-30 NOTE — Anesthesia Preprocedure Evaluation (Addendum)
"                                    Anesthesia Evaluation  Patient identified by MRN, date of birth, ID band Patient awake    Reviewed: Allergy & Precautions, NPO status , Patient's Chart, lab work & pertinent test results  History of Anesthesia Complications Negative for: history of anesthetic complications  Airway Mallampati: I  TM Distance: >3 FB Neck ROM: Full    Dental  (+) Teeth Intact, Dental Advisory Given   Pulmonary former smoker   breath sounds clear to auscultation       Cardiovascular hypertension, Pt. on medications pulmonary hypertension (Moderate via TTE (06/2024))+CHF  + dysrhythmias (Pradaxa ) Atrial Fibrillation  Rhythm:Irregular Rate:Normal   TTE (2026): 1. Left ventricular ejection fraction, by estimation, is 60 to 65%. The  left ventricle has normal function. The left ventricle has no regional  wall motion abnormalities. Left ventricular diastolic parameters are  indeterminate.   2. Right ventricular systolic function is normal. The right ventricular  size is normal. There is moderately elevated pulmonary artery systolic  pressure. The estimated right ventricular systolic pressure is 47.3 mmHg.   3. Left atrial size was moderately dilated.   4. Right atrial size was moderately dilated.   5. The mitral valve is degenerative. Mild mitral valve regurgitation. No  evidence of mitral stenosis.   6. The aortic valve is tricuspid. There is mild calcification of the  aortic valve. Aortic valve regurgitation is not visualized. Aortic valve  sclerosis/calcification is present, without any evidence of aortic  stenosis.   7. The inferior vena cava is dilated in size with <50% respiratory  variability, suggesting right atrial pressure of 15 mmHg.     Neuro/Psych    GI/Hepatic ,GERD  Medicated and Controlled,,(+)     substance abuse (MS Contin  30 mg BID)    Endo/Other    Renal/GU Renal disease     Musculoskeletal  (+)  narcotic dependent   Abdominal   Peds  Hematology  (+) Blood dyscrasia, anemia Hgb 6.9 s/p 1 unit pRBC, Plts 187K (06/30/24)   Anesthesia Other Findings   Reproductive/Obstetrics                              Anesthesia Physical Anesthesia Plan  ASA: 3  Anesthesia Plan: MAC   Post-op Pain Management:    Induction: Intravenous  PONV Risk Score and Plan: 2 and Treatment may vary due to age or medical condition  Airway Management Planned: Natural Airway and Simple Face Mask  Additional Equipment: None  Intra-op Plan:   Post-operative Plan:   Informed Consent: I have reviewed the patients History and Physical, chart, labs and discussed the procedure including the risks, benefits and alternatives for the proposed anesthesia with the patient or authorized representative who has indicated his/her understanding and acceptance.     Dental advisory given  Plan Discussed with: CRNA  Anesthesia Plan Comments:          Anesthesia Quick Evaluation  "

## 2024-06-30 NOTE — Hospital Course (Signed)
 Patient with PMH of chronic A-fib on Pradaxa , GERD, chronic pain, osteoarthritis, HTN presented to the hospital with complaints of abnormal lab. Since Christmas he had blood in his stool. He went to see the PCP and was found to have hemoglobin of 6.7 and therefore was asked to come to the hospital for further workup. [facility] consulted.  Underwent EGD on 1/8.  No active bleeding seen.  Assessment and Plan: Acute GI bleeding. So far appears that the bleeding has stopped. H&H dropped to 6.9 requiring 1 PRBC transfusion. GI consulted. Underwent EGD on 1/8.  No active bleeding.  Monitor.   Symptomatic anemia. Hemoglobin of 6.1 on admission. Transfused 1 PRBC transfusion. H&H dropped to 6.9 on 1/8.  Required another PRBC transfusion. Monitor CBC.   Muscle cramps. Reports that he has chronic muscle cramps. Drinks 5 to 16 ounces of water bottle at nighttime. Add Robaxin . Provide gentle IV hydration.  Right leg edema. Concern for acute on chronic diastolic CHF. Echocardiogram showed EF of 65%. Does not appear to be volume overloaded right now. Treated with IV Lasix  on admission. Lower extremity Doppler negative for DVT. Monitor weight   Hyponatremia. Corrected. Monitor.   Chronic A-fib. Currently rate controlled. On Pradaxa  at home. Continue to hold.  Chronic pain syndrome. Patient on MS Contin  30 mg twice daily.  Continue.

## 2024-06-30 NOTE — Interval H&P Note (Signed)
 History and Physical Interval Note:  06/30/2024 1:40 PM  Kent [name]  has presented today for surgery, with the diagnosis of Gastrointestinal bleed.  The various methods of treatment have been discussed with the patient and family. After consideration of risks, benefits and other options for treatment, the patient has consented to  Procedures: EGD (ESOPHAGOGASTRODUODENOSCOPY) (N/A) SIGMOIDOSCOPY, FLEXIBLE (N/A) as a surgical intervention.  The patient's history has been reviewed, patient examined, no change in status, stable for surgery.  I have reviewed the patient's chart and labs.  Questions were answered to the patient's satisfaction.     [name]

## 2024-06-30 NOTE — Op Note (Signed)
 [facility] Patient Name: Kent [name] Procedure Date: 06/30/2024 MRN: [id number] Attending MD: [name] , MD, [id number] Date of Birth: [birth year] CSN: [id number] Age: 84 Admit Type: Inpatient Procedure:                Upper GI endoscopy Indications:              Hematochezia Providers:                Jerrell KYM Sol, MD, Jacquelyn Jaci Pierce,                            RN, Corky Czech, Technician, Sandra J. Dasie                            CRNA, CRNA Referring MD:             hospital team Medicines:                Propofol  per Anesthesia, Monitored Anesthesia Care Complications:            No immediate complications. Estimated Blood Loss:     Estimated blood loss: none. Procedure:                Pre-Anesthesia Assessment:                           - Prior to the procedure, a History and Physical                            was performed, and patient medications and                            allergies were reviewed. The patient's tolerance of                            previous anesthesia was also reviewed. The risks                            and benefits of the procedure and the sedation                            options and risks were discussed with the patient.                            All questions were answered, and informed consent                            was obtained. Prior Anticoagulants: The patient has                            taken no anticoagulant or antiplatelet agents. ASA                            Grade Assessment: III - A patient with severe  systemic disease. After reviewing the risks and                            benefits, the patient was deemed in satisfactory                            condition to undergo the procedure.                           After obtaining informed consent, the endoscope was                            passed under direct vision. Throughout the                             procedure, the patient's blood pressure, pulse, and                            oxygen saturations were monitored continuously. The                            GIF-H190 ([id number]) Olympus endoscope was introduced                            through the mouth, and advanced to the second part                            of duodenum. The upper GI endoscopy was                            accomplished without difficulty. The patient                            tolerated the procedure well. Scope In: Scope Out: Findings:      The examined esophagus was normal.      The Z-line was regular and was found 45 cm from the incisors.      Segmental mild inflammation characterized by congestion (edema) was       found in the gastric antrum.      The cardia and gastric fundus were normal on retroflexion.      The examined duodenum was normal. Impression:               - Normal esophagus.                           - Z-line regular, 45 cm from the incisors.                           - Gastritis.                           - Normal examined duodenum.                           - No specimens collected. Moderate Sedation:  N/A - MAC procedure Recommendation:           - Clear liquid diet.                           - Observe patient's clinical course. Procedure Code(s):        --- Professional ---                           [phone], Esophagogastroduodenoscopy, flexible,                            transoral; diagnostic, including collection of                            specimen(s) by brushing or washing, when performed                            (separate procedure) Diagnosis Code(s):        --- Professional ---                           K92.1, Melena (includes Hematochezia)                           K29.70, Gastritis, unspecified, without bleeding CPT copyright 2022 American Medical Association. All rights reserved. The codes documented in this report are preliminary and upon coder review may  be revised to  meet current compliance requirements. [name], MD 06/30/2024 2:10:50 PM This report has been signed electronically. Number of Addenda: 0

## 2024-06-30 NOTE — Progress Notes (Signed)
 [facility] Progress Note Patient: Kent [name] FMW:[id number] DOB: 1940-12-01  DOA: 06/28/2024 DOS: the patient was seen and examined on 06/30/2024  Brief Hospital Course: Patient with PMH of chronic A-fib on Pradaxa , GERD, chronic pain, osteoarthritis, HTN presented to the hospital with complaints of abnormal lab. Since Christmas he had blood in his stool. He went to see the PCP and was found to have hemoglobin of 6.7 and therefore was asked to come to the hospital for further workup. [facility] consulted.  Underwent EGD on 1/8.  No active bleeding seen.  Assessment and Plan: Acute GI bleeding. So far appears that the bleeding has stopped. H&H dropped to 6.9 requiring 1 PRBC transfusion. GI consulted. Underwent EGD on 1/8.  No active bleeding.  Monitor.   Symptomatic anemia. Hemoglobin of 6.1 on admission. Transfused 1 PRBC transfusion. H&H dropped to 6.9 on 1/8.  Required another PRBC transfusion. Monitor CBC.   Muscle cramps. Reports that he has chronic muscle cramps. Drinks 5 to 16 ounces of water bottle at nighttime. Add Robaxin . Provide gentle IV hydration.  Right leg edema. Concern for acute on chronic diastolic CHF. Echocardiogram showed EF of 65%. Does not appear to be volume overloaded right now. Treated with IV Lasix  on admission. Lower extremity Doppler negative for DVT. Monitor weight   Hyponatremia. Corrected. Monitor.   Chronic A-fib. Currently rate controlled. On Pradaxa  at home. Continue to hold.  Chronic pain syndrome. Patient on MS Contin  30 mg twice daily.  Continue.   Subjective: No nausea no vomiting.  No fever no chills.  Had a large BM with blood yesterday after enema.   Physical Exam: General: in Mild distress, No Rash Cardiovascular: S1 and S2 Present, No Murmur Abdomen: Bowel Sound present, No tenderness Extremities: No edema  Data Reviewed: I have Reviewed nursing notes, Vitals, and Lab results. Since last encounter, pertinent  lab results CBC and BMP   . I have ordered test including CBC and BMP  . I have discussed pt's care plan and test results with GI  .   Disposition: Status is: Inpatient Remains inpatient appropriate because: Monitor for improvement in H&H  Place TED hose Start: 06/28/24 1740   Family Communication: No one at bedside Level of care: Telemetry   Vitals:   06/30/24 1206 06/30/24 1228 06/30/24 1315 06/30/24 1413  BP: 124/68 126/69 (!) 154/76 (!) 104/41  Pulse: 62 70 72 61  Resp: 15 15 12 12   Temp: 97.9 F (36.6 C) 98 F (36.7 C) 98.2 F (36.8 C) 97.8 F (36.6 C)  TempSrc: Oral Oral Temporal Temporal  SpO2: 99% 98% 95% 100%  Weight:      Height:         Author: Yetta Blanch, MD 06/30/2024 2:21 PM  Please look on [email or web address] to find out who is on call.

## 2024-06-30 NOTE — Progress Notes (Signed)
 Pt completed 1U PRBC transfusion, no adverse reactions noted, vital signs taken and recorded per protocol.

## 2024-06-30 NOTE — Progress Notes (Signed)
" ° ° ° °  Patient Name: Kent [name]           DOB: 1941/02/07  MRN: [id number]      Admission Date: 06/28/2024  Attending Provider: Tobie Yetta HERO, MD  Primary Diagnosis: GI bleed   Level of care: Telemetry   OVERNIGHT EVENT   HPI/ Events of Note KENT RIENDEAU, 84 y.o. male, was admitted on 06/28/2024 for GI bleed.   Acute Blood Loss Anemia Acute GI bleeding. Hemoglobin 7.4 -->  6.9.  No acute changes reported tonight.  Hemodynamically stable. No melena, hematochezia, or other bleeding reported tonight.  GI planning Flexible sigmoidoscopy 1/8.   Plan: Blood transfusion, 1 unit PRBC Recheck H&H after transfusion is completed, transfuse if HGB <7   Lavanda Horns, DNP, ACNPC- AG [facility] [place]  "

## 2024-06-30 NOTE — Plan of Care (Signed)
  Problem: Clinical Measurements: Goal: Will remain free from infection Outcome: Progressing Goal: Respiratory complications will improve Outcome: Progressing Goal: Cardiovascular complication will be avoided Outcome: Progressing   Problem: Activity: Goal: Risk for activity intolerance will decrease Outcome: Progressing   Problem: Nutrition: Goal: Adequate nutrition will be maintained Outcome: Progressing   Problem: Coping: Goal: Level of anxiety will decrease Outcome: Progressing   Problem: Elimination: Goal: Will not experience complications related to bowel motility Outcome: Progressing Goal: Will not experience complications related to urinary retention Outcome: Progressing   Problem: Pain Managment: Goal: General experience of comfort will improve and/or be controlled Outcome: Progressing   Problem: Safety: Goal: Ability to remain free from injury will improve Outcome: Progressing   Problem: Skin Integrity: Goal: Risk for impaired skin integrity will decrease Outcome: Progressing

## 2024-06-30 NOTE — Plan of Care (Signed)

## 2024-06-30 NOTE — Op Note (Signed)
 [facility] Patient Name: Kent [name] Procedure Date: 06/30/2024 MRN: [id number] Attending MD: [name] , MD, [id number] Date of Birth: [birth year] CSN: [id number] Age: 84 Admit Type: Inpatient Procedure:                Flexible Sigmoidoscopy Indications:              Hematochezia Providers:                Jerrell KYM Sol, MD, Jacquelyn Jaci Pierce,                            RN, Corky Czech, Technician, Sandra J. Dasie                            CRNA, CRNA Referring MD:             hospital team Medicines:                Propofol  per Anesthesia, Monitored Anesthesia Care Complications:            No immediate complications. Estimated Blood Loss:     Estimated blood loss: none. Procedure:                Pre-Anesthesia Assessment:                           - Prior to the procedure, a History and Physical                            was performed, and patient medications and                            allergies were reviewed. The patient's tolerance of                            previous anesthesia was also reviewed. The risks                            and benefits of the procedure and the sedation                            options and risks were discussed with the patient.                            All questions were answered, and informed consent                            was obtained. Prior Anticoagulants: The patient has                            taken no anticoagulant or antiplatelet agents. ASA                            Grade Assessment: III - A patient with severe  systemic disease. After reviewing the risks and                            benefits, the patient was deemed in satisfactory                            condition to undergo the procedure.                           After obtaining informed consent, the scope was                            passed under direct vision. The GIF-[id number] ([id number])                             Olympus endoscope was introduced through the anus                            and advanced to the the ileo-rectal anastomosis.                            The flexible sigmoidoscopy was accomplished without                            difficulty. The patient tolerated the procedure                            well. The quality of the bowel preparation was                            adequate. Scope In: 2:05:10 PM Scope Out: 2:07:16 PM Total Procedure Duration: 0 hours 2 minutes 6 seconds  Findings:      Internal hemorrhoids were found during retroflexion. The hemorrhoids       were small and Grade I (internal hemorrhoids that do not prolapse).      Hemorrhoids were found on perianal exam.      There was evidence of a prior end-to-end ileo-colonic anastomosis in the       recto-sigmoid colon. This was patent and was characterized by healthy       appearing mucosa. The anastomosis was traversed.      A moderate amount of semi-solid solid stool was found in the rectum,       making visualization difficult. Lavage of the area was performed,       resulting in clearance with adequate visualization. Impression:               - Internal hemorrhoids.                           - Hemorrhoids found on perianal exam.                           - Patent end-to-end ileo-colonic anastomosis,                            characterized by healthy appearing mucosa.                           -  Stool in the rectum.                           - No specimens collected. Moderate Sedation:      N/A - MAC procedure Recommendation:           - Advance diet as tolerated. Procedure Code(s):        --- Professional ---                           [phone], Sigmoidoscopy, flexible; diagnostic,                            including collection of specimen(s) by brushing or                            washing, when performed (separate procedure) Diagnosis Code(s):        --- Professional ---                           K92.1,  Melena (includes Hematochezia)                           Z98.0, Intestinal bypass and anastomosis status                           K64.0, First degree hemorrhoids CPT copyright 2022 American Medical Association. All rights reserved. The codes documented in this report are preliminary and upon coder review may  be revised to meet current compliance requirements. [name], MD 06/30/2024 2:16:34 PM This report has been signed electronically. Number of Addenda: 0

## 2024-06-30 NOTE — Plan of Care (Signed)
   Problem: Coping: Goal: Level of anxiety will decrease Outcome: Progressing   Problem: Pain Managment: Goal: General experience of comfort will improve and/or be controlled Outcome: Progressing   Problem: Safety: Goal: Ability to remain free from injury will improve Outcome: Progressing

## 2024-06-30 NOTE — Transfer of Care (Signed)
 Immediate Anesthesia Transfer of Care Note  Patient: Kent [name]  Procedure(s) Performed: EGD (ESOPHAGOGASTRODUODENOSCOPY) SIGMOIDOSCOPY, FLEXIBLE  Patient Location: Endoscopy Unit  Anesthesia Type:MAC  Level of Consciousness: drowsy and patient cooperative  Airway & Oxygen Therapy: Patient Spontanous Breathing and Patient connected to face mask oxygen  Post-op Assessment: Report given to RN and Post -op Vital signs reviewed and stable  Post vital signs: Reviewed and stable  Last Vitals:  Vitals Value Taken Time  BP 104/41 06/30/24 14:13  Temp 36.6 C 06/30/24 14:13  Pulse 63 06/30/24 14:14  Resp 13 06/30/24 14:14  SpO2 100 % 06/30/24 14:14  Vitals shown include unfiled device data.  Last Pain:  Vitals:   06/30/24 1413  TempSrc: Temporal  PainSc:          Complications: No notable events documented.

## 2024-07-01 ENCOUNTER — Encounter (HOSPITAL_COMMUNITY): Payer: Self-pay | Admitting: Gastroenterology

## 2024-07-01 ENCOUNTER — Other Ambulatory Visit (HOSPITAL_COMMUNITY): Payer: Self-pay

## 2024-07-01 ENCOUNTER — Encounter (HOSPITAL_COMMUNITY): Payer: Self-pay | Admitting: Student

## 2024-07-01 DIAGNOSIS — K922 Gastrointestinal hemorrhage, unspecified: Secondary | ICD-10-CM | POA: Diagnosis not present

## 2024-07-01 LAB — CBC
HCT: 25.5 % — ABNORMAL LOW (ref 39.0–52.0)
HCT: 27.2 % — ABNORMAL LOW (ref 39.0–52.0)
Hemoglobin: 8.1 g/dL — ABNORMAL LOW (ref 13.0–17.0)
Hemoglobin: 8.8 g/dL — ABNORMAL LOW (ref 13.0–17.0)
MCH: 26.8 pg (ref 26.0–34.0)
MCH: 27.3 pg (ref 26.0–34.0)
MCHC: 31.8 g/dL (ref 30.0–36.0)
MCHC: 32.4 g/dL (ref 30.0–36.0)
MCV: 84.4 fL (ref 80.0–100.0)
MCV: 84.5 fL (ref 80.0–100.0)
Platelets: 183 K/uL (ref 150–400)
Platelets: 192 K/uL (ref 150–400)
RBC: 3.02 MIL/uL — ABNORMAL LOW (ref 4.22–5.81)
RBC: 3.22 MIL/uL — ABNORMAL LOW (ref 4.22–5.81)
RDW: 15.4 % (ref 11.5–15.5)
RDW: 15.2 % (ref 11.5–15.5)
WBC: 3.6 K/uL — ABNORMAL LOW (ref 4.0–10.5)
WBC: 3.4 K/uL — ABNORMAL LOW (ref 4.0–10.5)
nRBC: 0 % (ref 0.0–0.2)
nRBC: 0 % (ref 0.0–0.2)

## 2024-07-01 LAB — MAGNESIUM: Magnesium: 2.1 mg/dL (ref 1.7–2.4)

## 2024-07-01 LAB — BASIC METABOLIC PANEL WITH GFR
Anion gap: 7 (ref 5–15)
BUN: 11 mg/dL (ref 8–23)
CO2: 25 mmol/L (ref 22–32)
Calcium: 8.5 mg/dL — ABNORMAL LOW (ref 8.9–10.3)
Chloride: 104 mmol/L (ref 98–111)
Creatinine, Ser: 1.35 mg/dL — ABNORMAL HIGH (ref 0.61–1.24)
GFR, Estimated: 53 mL/min — ABNORMAL LOW
Glucose, Bld: 84 mg/dL (ref 70–99)
Potassium: 4.3 mmol/L (ref 3.5–5.1)
Sodium: 135 mmol/L (ref 135–145)

## 2024-07-01 LAB — TYPE AND SCREEN
ABO/RH(D): O NEG
Antibody Screen: NEGATIVE
Unit division: 0
Unit division: 0

## 2024-07-01 LAB — BPAM RBC
Blood Product Expiration Date: 202601292359
Blood Product Expiration Date: 202601292359
ISSUE DATE / TIME: 202601061504
ISSUE DATE / TIME: 202601080853
Unit Type and Rh: 9500
Unit Type and Rh: 9500

## 2024-07-01 MED ORDER — SENNOSIDES-DOCUSATE SODIUM 8.6-50 MG PO TABS
1.0000 | ORAL_TABLET | Freq: Every evening | ORAL | 0 refills | Status: AC | PRN
  Filled 2024-07-01: qty 30, 30d supply, fill #0

## 2024-07-01 MED ORDER — SENNOSIDES-DOCUSATE SODIUM 8.6-50 MG PO TABS: 1.0000 | ORAL_TABLET | Freq: Every day | ORAL | Status: DC

## 2024-07-01 MED ORDER — DABIGATRAN ETEXILATE MESYLATE 150 MG PO CAPS
150.0000 mg | ORAL_CAPSULE | Freq: Two times a day (BID) | ORAL | Status: DC
  Administered 2024-07-01: 150 mg via ORAL
  Filled 2024-07-01 (×2): qty 1

## 2024-07-01 NOTE — Progress Notes (Signed)
 Discharge med in a secure bag delivered to patient's nurse by this RN.

## 2024-07-01 NOTE — Progress Notes (Signed)
" °   07/01/24 1018  TOC Brief Assessment  Insurance and Status Reviewed  Patient has primary care physician Yes  Home environment has been reviewed Resides in single family home with spouse  Prior level of function: Independent with ADLs at baseline  Prior/Current Home Services No current home services  Social Drivers of Health Review SDOH reviewed no interventions necessary  Readmission risk has been reviewed Yes  Transition of care needs no transition of care needs at this time    "

## 2024-07-01 NOTE — Progress Notes (Signed)
 [facility] Gastroenterology Progress Note  MCIHAEL [name] 84 y.o. 14-Nov-1940   Subjective: Feels good. Denies abdominal pain. No BM overnight.  Objective: Vital signs: Vitals:   06/30/24 2152 07/01/24 0546  BP: 120/63 133/72  Pulse: 67 73  Resp: 16 16  Temp: 98.3 F (36.8 C) 97.9 F (36.6 C)  SpO2: 100% 99%    Physical Exam: Gen: alert, no acute distress, thin, elderly, pleasant HEENT: anicteric sclera CV: RRR Chest: CTA B Abd: soft, nontender, nondistended, +BS Ext: no edema  Lab Results: Recent Labs    06/30/24 0339 06/30/24 1344 07/01/24 0325  NA 136 138 135  K 4.6 4.3 4.3  CL 104 102 104  CO2 27  --  25  GLUCOSE 85 93 84  BUN 13 10 11   CREATININE 1.39* 1.50* 1.35*  CALCIUM 8.4*  --  8.5*  MG 2.1  --  2.1   Recent Labs    06/28/24 1314  AST 22  ALT 12  ALKPHOS 64  BILITOT 0.5  PROT 6.4*  ALBUMIN  3.4*   Recent Labs    07/01/24 0325 07/01/24 0842  WBC 3.6* 3.4*  HGB 8.1* 8.8*  HCT 25.5* 27.2*  MCV 84.4 84.5  PLT 183 192      Assessment/Plan: GI bleed - suspect hemorrhoids. Use hemorrhoidal suppository prn and hemorrhoidal ointment/cream prn as outpt and if he continues to have rectal bleeding then will need to see surgeons as outpt for hemorrhoid management. Ok to resume Pradaxa . Regular diet. Ok to go home today. F/U with Dr. Rosalie in 2 months.   Kent [name] 07/01/2024, 9:45 AM  Questions please call [phone] ID: Kent [name], male   DOB: 09-26-1940, 84 y.o.   MRN: [id number]

## 2024-07-01 NOTE — Progress Notes (Signed)
 Heart Failure Navigator Progress Note  Assessed for Heart & Vascular TOC clinic readiness.  Patient does not meet criteria due to EF 60-65%, patient admitted for abnormal labs, Hemoglobin 6.7, Underwent EGD on 06/30/24, found no active bleeding. Per MD note patient to follow up with GI in 2 months. No HF TOC. .   Navigator will sign off at this time.   Stephane Haddock, BSN, Scientist, Clinical (histocompatibility And Immunogenetics) Only

## 2024-07-03 NOTE — Discharge Summary (Signed)
 " Physician Discharge Summary   Patient: Kent [name] MRN: [id number] DOB: 23-May-1941  Admit date:     06/28/2024  Discharge date: 07/01/2024  Discharge Physician: Yetta Blanch  PCP: Arloa Elsie SAUNDERS, MD  Disposition: Home Recommendations at discharge: Follow-up with PCP. CBC BMP in 1 week. Follow-up with GI as recommended.   Follow-up Information     Arloa Elsie SAUNDERS, MD. Schedule an appointment as soon as possible for a visit in 1 week(s).   Specialty: Family Medicine Why: with BMP lab to look at kidney/electrolyte numbers, with CBC lab to look at blood counts Contact information: [address] [place] [address] [phone]         Rosalie Kitchens, MD Follow up.   Specialty: Gastroenterology Why: the Appointment As Scheduled Contact information: [address] [place] [address] [phone]                Hospital Course: Patient with PMH of chronic A-fib on Pradaxa , GERD, chronic pain, osteoarthritis, HTN presented to the hospital with complaints of abnormal lab. Since Christmas he had blood in his stool. He went to see the PCP and was found to have hemoglobin of 6.7 and therefore was asked to come to the hospital for further workup. [facility] consulted.  Underwent EGD on 1/8.  No active bleeding seen.  Assessment and Plan: Acute GI bleeding. So far appears that the bleeding has stopped. H&H dropped to 6.9 requiring 1 PRBC transfusion. GI consulted. Underwent EGD on 1/8.  No active bleeding.  Switch to Pradaxa  with stable hemoglobin.   Symptomatic anemia. Hemoglobin of 6.1 on admission. Transfused 1 PRBC transfusion. H&H dropped to 6.9 on 1/8.  Required another PRBC transfusion. Hemoglobin remained stable after that.   Muscle cramps. Reports that he has chronic muscle cramps. Drinks 5 to 16 ounces of water bottle at nighttime. Treated with Robaxin  and fluid.  Right leg edema. Concern for acute on chronic diastolic  CHF. Echocardiogram showed EF of 65%. Does not appear to be volume overloaded right now. Treated with IV Lasix  on admission. Lower extremity Doppler negative for DVT.   Hyponatremia. Corrected. Monitor.   Chronic A-fib. Currently rate controlled. On Pradaxa  at home. Continue to hold.  Chronic pain syndrome. Patient on MS Contin  30 mg twice daily.  Continue.   Family Communication: Plan was discussed with FamilyThe pt provided permission to discuss medical plan with the family. Opportunity was given to ask question and all questions were answered satisfactorily.  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   dabigatran  150 MG Caps capsule Commonly known as: PRADAXA  Take 1 capsule (150 mg total) by mouth 2 (two) times daily. NEEDS CARDIOLOGY APPT FOR REFILLS, CALL OFFICE [phone]. THANK YOU. What changed:  when to take this additional instructions   morphine  30 MG 12 hr tablet Commonly known as: MS CONTIN  Take 30 mg by mouth every 12 (twelve) hours.   Stool Softener/Laxative 50-8.6 MG tablet Generic drug: senna-docusate Take 1 tablet by  mouth at bedtime as needed for mild constipation.        Diet recommendation: Cardiac diet  Discharge Exam: Vitals:   06/30/24 2152 07/01/24 0546 07/01/24 0552 07/01/24 1402  BP: 120/63 133/72  125/67  Pulse: 67 73  72  Resp: 16 16  16   Temp: 98.3 F (36.8 C) 97.9 F (36.6 C)  (!) 97.5 F (36.4 C)  TempSrc: Oral Oral  Oral  SpO2: 100% 99%  100%  Weight:   68.1 kg   Height:        Filed Weights   06/29/24 2000 06/30/24 0704 07/01/24 0552   Weight: 69.4 kg 66.9 kg 68.1 kg   Clear to auscultation. S1-S2 present normal bowel sound. No edema.  Condition at discharge: stable  The results of significant diagnostics from this hospitalization (including imaging, microbiology, ancillary and laboratory) are listed below for reference.   Imaging Studies: ECHOCARDIOGRAM COMPLETE Result Date: 06/29/2024    ECHOCARDIOGRAM REPORT   Patient Name:   Kent [name] Date of Exam: 06/29/2024 Medical Rec #:  [id number]      Height:       70.0 in Accession #:    [id number]     Weight:       156.7 lb Date of Birth:  1941-02-09      BSA:          1.882 m Patient Age:    83 years       BP:           117/58 mmHg Patient Gender: M              HR:           68 bpm. Exam Location:  Inpatient Procedure: 2D Echo, Cardiac Doppler and Color Doppler (Both Spectral and Color            Flow Doppler were utilized during procedure). Indications:    R06.02 SOB  History:        Patient has prior history of Echocardiogram examinations, most                 recent 07/05/2022. CHF, Abnormal ECG, Arrythmias:Atrial                 Fibrillation, Signs/Symptoms:Edema; Risk Factors:Hypertension.  Sonographer:    Ellouise Mose RDCS Referring Phys: [id number] PROSPER M AMPONSAH IMPRESSIONS  1. Left ventricular ejection fraction, by estimation, is 60 to 65%. The left ventricle has normal function. The left ventricle has no regional wall motion abnormalities. Left ventricular diastolic parameters are indeterminate.  2. Right ventricular systolic function is normal. The right ventricular size is normal. There is moderately elevated pulmonary artery systolic pressure. The estimated right ventricular systolic pressure is 47.3 mmHg.  3. Left atrial size was moderately dilated.  4. Right atrial size was moderately dilated.  5. The mitral valve is degenerative. Mild mitral valve regurgitation. No evidence of mitral stenosis.  6. The aortic valve is tricuspid. There is mild calcification of the aortic  valve. Aortic valve regurgitation is not visualized. Aortic valve sclerosis/calcification is present, without any evidence of aortic stenosis.  7. The inferior vena cava is dilated in size with <50% respiratory variability, suggesting right atrial pressure of 15 mmHg. FINDINGS  Left Ventricle: Left ventricular ejection fraction, by estimation, is 60 to 65%. The left ventricle has normal function. The left ventricle has no regional wall motion abnormalities. The left ventricular internal cavity size was normal in size. There is  no left  ventricular hypertrophy. Left ventricular diastolic parameters are indeterminate. Right Ventricle: The right ventricular size is normal. No increase in right ventricular wall thickness. Right ventricular systolic function is normal. There is moderately elevated pulmonary artery systolic pressure. The tricuspid regurgitant velocity is 2.84 m/s, and with an assumed right atrial pressure of 15 mmHg, the estimated right ventricular systolic pressure is 47.3 mmHg. Left Atrium: Left atrial size was moderately dilated. Right Atrium: Right atrial size was moderately dilated. Pericardium: There is no evidence of pericardial effusion. Mitral Valve: The mitral valve is degenerative in appearance. Mild mitral valve regurgitation. No evidence of mitral valve stenosis. Tricuspid Valve: The tricuspid valve is normal in structure. Tricuspid valve regurgitation is mild . No evidence of tricuspid stenosis. Aortic Valve: The aortic valve is tricuspid. There is mild calcification of the aortic valve. Aortic valve regurgitation is not visualized. Aortic valve sclerosis/calcification is present, without any evidence of aortic stenosis. Pulmonic Valve: The pulmonic valve was not well visualized. Pulmonic valve regurgitation is not visualized. No evidence of pulmonic stenosis. Aorta: The aortic root and ascending aorta are structurally normal, with no evidence of dilitation. Venous: The inferior vena cava is  dilated in size with less than 50% respiratory variability, suggesting right atrial pressure of 15 mmHg. IAS/Shunts: No atrial level shunt detected by color flow Doppler.  LEFT VENTRICLE PLAX 2D LVIDd:         4.40 cm LVIDs:         3.50 cm LV PW:         0.90 cm LV IVS:        0.80 cm LVOT diam:     2.10 cm LV SV:         90 LV SV Index:   48 LVOT Area:     3.46 cm  LV Volumes (MOD) LV vol d, MOD A2C: 82.5 ml LV vol d, MOD A4C: 84.1 ml LV vol s, MOD A2C: 35.9 ml LV vol s, MOD A4C: 33.1 ml LV SV MOD A2C:     46.6 ml LV SV MOD A4C:     84.1 ml LV SV MOD BP:      49.2 ml RIGHT VENTRICLE             IVC RV S prime:     12.80 cm/s  IVC diam: 2.30 cm TAPSE (M-mode): 2.2 cm LEFT ATRIUM             Index        RIGHT ATRIUM           Index LA diam:        3.40 cm 1.81 cm/m   RA Area:     19.00 cm LA Vol (A2C):   58.6 ml 31.14 ml/m  RA Volume:   59.90 ml  31.83 ml/m LA Vol (A4C):   87.0 ml 46.23 ml/m LA Biplane Vol: 75.0 ml 39.85 ml/m  AORTIC VALVE LVOT Vmax:   124.00 cm/s LVOT Vmean:  84.100 cm/s LVOT VTI:    0.259 m  AORTA Ao Root diam: 3.40 cm Ao Asc diam:  3.10 cm MITRAL VALVE                  TRICUSPID VALVE MV Area (PHT): 3.72 cm       TR Peak grad:   32.3 mmHg MV Decel Time: 204 msec       TR Vmax:        284.00 cm/s MR Peak grad:    129.5  mmHg MR Mean grad:    88.0 mmHg    SHUNTS MR Vmax:         569.00 cm/s  Systemic VTI:  0.26 m MR Vmean:        446.0 cm/s   Systemic Diam: 2.10 cm MR PISA:         0.77 cm MR PISA Eff ROA: 4 mm MR PISA Radius:  0.35 cm MV E velocity: 121.00 cm/s Toribio Fuel MD Electronically signed by Toribio Fuel MD Signature Date/Time: 06/29/2024/10:30:33 PM    Final    VAS US  LOWER EXTREMITY VENOUS (DVT) Result Date: 06/29/2024  Lower Venous DVT Study Patient Name:  Kent [name]  Date of Exam:   06/29/2024 Medical Rec #: [id number]       Accession #:    [id number] Date of Birth: [birth year]       Patient Gender: M Patient Age:   15 years Exam Location:  [facility]  Procedure:      VAS US  LOWER EXTREMITY VENOUS (DVT) Referring Phys: PROSPER AMPONSAH --------------------------------------------------------------------------------  Indications: Swelling.  Risk Factors: None identified. Comparison Study: No prior studies. Performing Technologist: Cordella Collet RVT  Examination Guidelines: A complete evaluation includes B-mode imaging, spectral Doppler, color Doppler, and power Doppler as needed of all accessible portions of each vessel. Bilateral testing is considered an integral part of a complete examination. Limited examinations for reoccurring indications may be performed as noted. The reflux portion of the exam is performed with the patient in reverse Trendelenburg.  +---------+---------------+---------+-----------+----------+--------------+ RIGHT    CompressibilityPhasicitySpontaneityPropertiesThrombus Aging +---------+---------------+---------+-----------+----------+--------------+ CFV      Full           Yes      Yes                                 +---------+---------------+---------+-----------+----------+--------------+ SFJ      Full                                                        +---------+---------------+---------+-----------+----------+--------------+ FV Prox  Full                                                        +---------+---------------+---------+-----------+----------+--------------+ FV Mid   Full                                                        +---------+---------------+---------+-----------+----------+--------------+ FV DistalFull                                                        +---------+---------------+---------+-----------+----------+--------------+ PFV      Full                                                        +---------+---------------+---------+-----------+----------+--------------+  POP      Full           Yes      Yes                                  +---------+---------------+---------+-----------+----------+--------------+ PTV      Full                                                        +---------+---------------+---------+-----------+----------+--------------+ PERO     Full                                                        +---------+---------------+---------+-----------+----------+--------------+   +---------+---------------+---------+-----------+----------+--------------+ LEFT     CompressibilityPhasicitySpontaneityPropertiesThrombus Aging +---------+---------------+---------+-----------+----------+--------------+ CFV      Full           Yes      Yes                                 +---------+---------------+---------+-----------+----------+--------------+ SFJ      Full                                                        +---------+---------------+---------+-----------+----------+--------------+ FV Prox  Full                                                        +---------+---------------+---------+-----------+----------+--------------+ FV Mid   Full                                                        +---------+---------------+---------+-----------+----------+--------------+ FV DistalFull                                                        +---------+---------------+---------+-----------+----------+--------------+ PFV      Full                                                        +---------+---------------+---------+-----------+----------+--------------+ POP      Full           Yes      Yes                                 +---------+---------------+---------+-----------+----------+--------------+  PTV      Full                                                        +---------+---------------+---------+-----------+----------+--------------+ PERO     Full                                                         +---------+---------------+---------+-----------+----------+--------------+     Summary: RIGHT: - There is no evidence of deep vein thrombosis in the lower extremity.  - No cystic structure found in the popliteal fossa.  LEFT: - There is no evidence of deep vein thrombosis in the lower extremity.  - No cystic structure found in the popliteal fossa.  *See table(s) above for measurements and observations. Electronically signed by Gaile New MD on 06/29/2024 at 7:09:01 PM.    Final    DG CHEST PORT 1 VIEW Result Date: 06/28/2024 CLINICAL DATA:  Short of breath EXAM: PORTABLE CHEST 1 VIEW COMPARISON:  07/22/2023, chest CT 07/05/2022 FINDINGS: Mild cardiomegaly with small pleural effusions. Central vascular congestion. Mild peripheral reticular opacities, consistent with chronic lung disease. Aortic atherosclerosis. No pneumothorax IMPRESSION: Cardiomegaly with small pleural effusions and central vascular congestion. Chronic lung disease. Electronically Signed   By: Luke Bun M.D.   On: 06/28/2024 19:01    Microbiology: Results for orders placed or performed during the hospital encounter of 07/22/23  Resp panel by RT-PCR (RSV, Flu A&B, Covid) Anterior Nasal Swab     Status: None   Collection Time: 07/22/23  9:07 PM   Specimen: Anterior Nasal Swab  Result Value Ref Range Status   SARS Coronavirus 2 by RT PCR NEGATIVE NEGATIVE Final   Influenza A by PCR NEGATIVE NEGATIVE Final   Influenza B by PCR NEGATIVE NEGATIVE Final    Comment: (NOTE) The Xpert Xpress SARS-CoV-2/FLU/RSV plus assay is intended as an aid in the diagnosis of influenza from Nasopharyngeal swab specimens and should not be used as a sole basis for treatment. Nasal washings and aspirates are unacceptable for Xpert Xpress SARS-CoV-2/FLU/RSV testing.  Fact Sheet for Patients: bloggercourse.com  Fact Sheet for Healthcare Providers: seriousbroker.it  This test is not yet approved or  cleared by the [place]  FDA and has been authorized for detection and/or diagnosis of SARS-CoV-2 by FDA under an Emergency Use Authorization (EUA). This EUA will remain in effect (meaning this test can be used) for the duration of the COVID-19 declaration under Section 564(b)(1) of the Act, 21 U.S.C. section 360bbb-3(b)(1), unless the authorization is terminated or revoked.     Resp Syncytial Virus by PCR NEGATIVE NEGATIVE Final    Comment: (NOTE) Fact Sheet for Patients: bloggercourse.com  Fact Sheet for Healthcare Providers: seriousbroker.it  This test is not yet approved or cleared by the [place]  FDA and has been authorized for detection and/or diagnosis of SARS-CoV-2 by FDA under an Emergency Use Authorization (EUA). This EUA will remain in effect (meaning this test can be used) for the duration of the COVID-19 declaration under Section 564(b)(1) of the Act, 21 U.S.C. section 360bbb-3(b)(1), unless the authorization is terminated or revoked.  Performed at [facility], [address] [place], [place] [address]    Labs: CBC: Recent Labs  Lab 06/29/24 1448 06/30/24 0339 06/30/24 1344 06/30/24 1551 06/30/24 1805 07/01/24 0325 07/01/24 0842  WBC 3.9* 3.3*  --   --  3.7* 3.6* 3.4*  HGB 7.4* 6.9* 9.9* 9.1* 9.1* 8.1* 8.8*  HCT 23.2* 21.4* 29.0* 28.3* 28.3* 25.5* 27.2*  MCV 82.3 81.7  --   --  84.7 84.4 84.5  PLT 205 187  --   --  204 183 192   Basic Metabolic Panel: Recent Labs  Lab 06/28/24 1314 06/29/24 0320 06/30/24 0339 06/30/24 1344 07/01/24 0325  NA 129* 135 136 138 135  K 4.1 4.3 4.6 4.3 4.3  CL 98 101 104 102 104  CO2 24 27 27   --  25  GLUCOSE 109* 113* 85 93 84  BUN 17 18 13 10 11   CREATININE 1.26* 1.57* 1.39* 1.50* 1.35*  CALCIUM 8.7* 8.6* 8.4*  --  8.5*  MG  --  2.1 2.1  --  2.1   Liver Function Tests: Recent Labs  Lab 06/28/24 1314  AST 22  ALT 12  ALKPHOS 64  BILITOT 0.5   PROT 6.4*  ALBUMIN  3.4*   CBG: No results for input(s): GLUCAP in the last 168 hours.  Discharge time spent: 35 minutes  Author: Yetta Blanch, MD  [facility] 07/01/2024   "

## 2024-07-03 NOTE — Anesthesia Postprocedure Evaluation (Signed)
"   Anesthesia Post Note  Patient: Kent [name]  Procedure(s) Performed: EGD (ESOPHAGOGASTRODUODENOSCOPY) SIGMOIDOSCOPY, FLEXIBLE     Patient location during evaluation: Endoscopy Anesthesia Type: MAC Level of consciousness: awake Pain management: pain level controlled Vital Signs Assessment: post-procedure vital signs reviewed and stable Respiratory status: spontaneous breathing Cardiovascular status: blood pressure returned to baseline Postop Assessment: no apparent nausea or vomiting Anesthetic complications: no   No notable events documented.                 Lauraine DASEN Colhoun      "

## 2024-07-08 ENCOUNTER — Other Ambulatory Visit: Payer: Self-pay | Admitting: Physician Assistant

## 2024-07-08 DIAGNOSIS — D6869 Other thrombophilia: Secondary | ICD-10-CM

## 2024-07-08 DIAGNOSIS — Z79899 Other long term (current) drug therapy: Secondary | ICD-10-CM

## 2024-07-08 DIAGNOSIS — I480 Paroxysmal atrial fibrillation: Secondary | ICD-10-CM

## 2024-07-25 ENCOUNTER — Inpatient Hospital Stay (HOSPITAL_COMMUNITY): Admission: RE | Admit: 2024-07-25 | Source: Ambulatory Visit

## 2024-08-02 ENCOUNTER — Encounter (HOSPITAL_COMMUNITY)

## 2024-08-08 ENCOUNTER — Ambulatory Visit (HOSPITAL_COMMUNITY): Admitting: Physician Assistant
